# Patient Record
Sex: Female | Born: 1975
Health system: Southern US, Community
[De-identification: ages and names within clinical notes are randomized; demographics above are authoritative.]

## PROBLEM LIST (undated history)

## (undated) DIAGNOSIS — F332 Major depressive disorder, recurrent severe without psychotic features: Secondary | ICD-10-CM

## (undated) DIAGNOSIS — F319 Bipolar disorder, unspecified: Secondary | ICD-10-CM

## (undated) DIAGNOSIS — F419 Anxiety disorder, unspecified: Secondary | ICD-10-CM

## (undated) HISTORY — DX: Major depressive disorder, recurrent severe without psychotic features: F33.2

## (undated) HISTORY — PX: KNEE SURGERY: SHX244

---

## 1997-11-10 ENCOUNTER — Emergency Department (HOSPITAL_COMMUNITY): Admission: EM | Admit: 1997-11-10 | Discharge: 1997-11-10 | Payer: Self-pay | Admitting: Emergency Medicine

## 1998-06-01 ENCOUNTER — Emergency Department (HOSPITAL_COMMUNITY): Admission: EM | Admit: 1998-06-01 | Discharge: 1998-06-01 | Payer: Self-pay | Admitting: Emergency Medicine

## 1998-06-23 ENCOUNTER — Emergency Department (HOSPITAL_COMMUNITY): Admission: EM | Admit: 1998-06-23 | Discharge: 1998-06-23 | Payer: Self-pay | Admitting: Emergency Medicine

## 1998-06-23 ENCOUNTER — Encounter: Payer: Self-pay | Admitting: Emergency Medicine

## 1999-07-23 ENCOUNTER — Emergency Department (HOSPITAL_COMMUNITY): Admission: EM | Admit: 1999-07-23 | Discharge: 1999-07-23 | Payer: Self-pay | Admitting: Emergency Medicine

## 2000-06-09 ENCOUNTER — Other Ambulatory Visit: Admission: RE | Admit: 2000-06-09 | Discharge: 2000-06-09 | Payer: Self-pay | Admitting: Obstetrics and Gynecology

## 2000-10-05 ENCOUNTER — Encounter (INDEPENDENT_AMBULATORY_CARE_PROVIDER_SITE_OTHER): Payer: Self-pay | Admitting: Specialist

## 2000-10-05 ENCOUNTER — Other Ambulatory Visit: Admission: RE | Admit: 2000-10-05 | Discharge: 2000-10-05 | Payer: Self-pay | Admitting: Obstetrics and Gynecology

## 2001-04-15 ENCOUNTER — Encounter: Payer: Self-pay | Admitting: Emergency Medicine

## 2001-04-15 ENCOUNTER — Emergency Department (HOSPITAL_COMMUNITY): Admission: EM | Admit: 2001-04-15 | Discharge: 2001-04-15 | Payer: Self-pay | Admitting: Emergency Medicine

## 2001-11-08 ENCOUNTER — Other Ambulatory Visit: Admission: RE | Admit: 2001-11-08 | Discharge: 2001-11-08 | Payer: Self-pay | Admitting: Obstetrics and Gynecology

## 2002-07-02 ENCOUNTER — Other Ambulatory Visit: Admission: RE | Admit: 2002-07-02 | Discharge: 2002-07-02 | Payer: Self-pay | Admitting: Obstetrics and Gynecology

## 2006-05-25 ENCOUNTER — Observation Stay (HOSPITAL_COMMUNITY): Admission: EM | Admit: 2006-05-25 | Discharge: 2006-05-26 | Payer: Self-pay | Admitting: Emergency Medicine

## 2006-05-25 ENCOUNTER — Ambulatory Visit: Payer: Self-pay | Admitting: Cardiology

## 2006-06-07 ENCOUNTER — Ambulatory Visit: Payer: Self-pay

## 2006-06-07 ENCOUNTER — Encounter: Payer: Self-pay | Admitting: Cardiology

## 2006-06-13 ENCOUNTER — Ambulatory Visit: Payer: Self-pay | Admitting: Internal Medicine

## 2007-03-07 ENCOUNTER — Emergency Department (HOSPITAL_COMMUNITY): Admission: EM | Admit: 2007-03-07 | Discharge: 2007-03-07 | Payer: Self-pay | Admitting: Emergency Medicine

## 2007-05-07 ENCOUNTER — Emergency Department (HOSPITAL_COMMUNITY): Admission: EM | Admit: 2007-05-07 | Discharge: 2007-05-07 | Payer: Self-pay | Admitting: Emergency Medicine

## 2007-06-06 ENCOUNTER — Inpatient Hospital Stay (HOSPITAL_COMMUNITY): Admission: AD | Admit: 2007-06-06 | Discharge: 2007-06-10 | Payer: Self-pay | Admitting: Psychiatry

## 2007-06-06 ENCOUNTER — Ambulatory Visit: Payer: Self-pay | Admitting: Psychiatry

## 2008-03-27 ENCOUNTER — Emergency Department (HOSPITAL_COMMUNITY): Admission: EM | Admit: 2008-03-27 | Discharge: 2008-03-27 | Payer: Self-pay | Admitting: Emergency Medicine

## 2008-06-01 ENCOUNTER — Encounter: Admission: RE | Admit: 2008-06-01 | Discharge: 2008-06-01 | Payer: Self-pay | Admitting: Obstetrics and Gynecology

## 2008-09-15 ENCOUNTER — Emergency Department (HOSPITAL_COMMUNITY): Admission: EM | Admit: 2008-09-15 | Discharge: 2008-09-15 | Payer: Self-pay | Admitting: Emergency Medicine

## 2008-10-01 ENCOUNTER — Other Ambulatory Visit (HOSPITAL_COMMUNITY): Admission: RE | Admit: 2008-10-01 | Discharge: 2008-10-09 | Payer: Self-pay | Admitting: Psychiatry

## 2008-10-07 ENCOUNTER — Emergency Department (HOSPITAL_COMMUNITY): Admission: EM | Admit: 2008-10-07 | Discharge: 2008-10-08 | Payer: Self-pay | Admitting: Emergency Medicine

## 2008-10-08 ENCOUNTER — Ambulatory Visit: Payer: Self-pay | Admitting: Psychiatry

## 2008-10-08 ENCOUNTER — Inpatient Hospital Stay (HOSPITAL_COMMUNITY): Admission: RE | Admit: 2008-10-08 | Discharge: 2008-10-13 | Payer: Self-pay | Admitting: Psychiatry

## 2008-10-15 ENCOUNTER — Other Ambulatory Visit (HOSPITAL_COMMUNITY): Admission: RE | Admit: 2008-10-15 | Discharge: 2008-10-17 | Payer: Self-pay | Admitting: Psychiatry

## 2008-10-17 ENCOUNTER — Emergency Department (HOSPITAL_COMMUNITY): Admission: EM | Admit: 2008-10-17 | Discharge: 2008-10-18 | Payer: Self-pay | Admitting: Emergency Medicine

## 2008-10-18 ENCOUNTER — Inpatient Hospital Stay (HOSPITAL_COMMUNITY): Admission: RE | Admit: 2008-10-18 | Discharge: 2008-10-25 | Payer: Self-pay | Admitting: Psychiatry

## 2009-08-16 ENCOUNTER — Emergency Department (HOSPITAL_COMMUNITY): Admission: EM | Admit: 2009-08-16 | Discharge: 2009-08-16 | Payer: Self-pay | Admitting: Emergency Medicine

## 2009-08-18 ENCOUNTER — Encounter: Admission: RE | Admit: 2009-08-18 | Discharge: 2009-08-18 | Payer: Self-pay | Admitting: Orthopaedic Surgery

## 2009-10-13 ENCOUNTER — Emergency Department (HOSPITAL_COMMUNITY): Admission: EM | Admit: 2009-10-13 | Discharge: 2009-10-14 | Payer: Self-pay | Admitting: Emergency Medicine

## 2009-10-14 DIAGNOSIS — F322 Major depressive disorder, single episode, severe without psychotic features: Secondary | ICD-10-CM

## 2009-11-27 ENCOUNTER — Ambulatory Visit: Payer: Self-pay | Admitting: Family Medicine

## 2010-01-27 ENCOUNTER — Ambulatory Visit: Payer: Self-pay | Admitting: Family Medicine

## 2010-02-03 ENCOUNTER — Ambulatory Visit: Payer: Self-pay | Admitting: Psychiatry

## 2010-04-19 ENCOUNTER — Encounter: Payer: Self-pay | Admitting: Obstetrics and Gynecology

## 2010-06-13 LAB — COMPREHENSIVE METABOLIC PANEL
ALT: 8 U/L (ref 0–35)
AST: 19 U/L (ref 0–37)
Albumin: 4 g/dL (ref 3.5–5.2)
Alkaline Phosphatase: 99 U/L (ref 39–117)
Calcium: 9.5 mg/dL (ref 8.4–10.5)
GFR calc Af Amer: >60 mL/min (ref >60)
Potassium: 4 mEq/L (ref 3.5–5.1)
Sodium: 140 mEq/L (ref 135–145)
Total Protein: 8.2 g/dL (ref 6.0–8.3)

## 2010-06-13 LAB — ACETAMINOPHEN LEVEL: Acetaminophen (Tylenol), Serum: <10 ug/mL — ABNORMAL LOW (ref 10–30)

## 2010-06-13 LAB — DIFFERENTIAL
Eosinophils Absolute: 0.2 10*3/uL (ref 0.0–0.7)
Eosinophils Relative: 3 % (ref 0–5)
Lymphs Abs: 2.7 10*3/uL (ref 0.7–4.0)
Monocytes Absolute: 0.7 10*3/uL (ref 0.1–1.0)
Monocytes Relative: 10 % (ref 3–12)

## 2010-06-13 LAB — RAPID URINE DRUG SCREEN, HOSP PERFORMED
Amphetamines: NOT DETECTED
Barbiturates: NOT DETECTED
Benzodiazepines: POSITIVE — AB
Tetrahydrocannabinol: NOT DETECTED

## 2010-06-13 LAB — CBC
MCHC: 33.5 g/dL (ref 30.0–36.0)
RDW: 12.8 % (ref 11.5–15.5)
WBC: 6.4 10*3/uL (ref 4.0–10.5)

## 2010-06-13 LAB — ETHANOL: Alcohol, Ethyl (B): 8 mg/dL (ref 0–10)

## 2010-06-13 LAB — VALPROIC ACID LEVEL: Valproic Acid Lvl: 32.4 ug/mL — ABNORMAL LOW (ref 50.0–100.0)

## 2010-07-05 LAB — VALPROIC ACID LEVEL: Valproic Acid Lvl: 47.3 ug/mL — ABNORMAL LOW (ref 50.0–100.0)

## 2010-07-05 LAB — DIFFERENTIAL
Basophils Absolute: 0 10*3/uL (ref 0.0–0.1)
Basophils Relative: 0 % (ref 0–1)
Eosinophils Relative: 1 % (ref 0–5)
Monocytes Absolute: 0.7 10*3/uL (ref 0.1–1.0)
Neutro Abs: 5 10*3/uL (ref 1.7–7.7)

## 2010-07-05 LAB — BASIC METABOLIC PANEL
BUN: 4 mg/dL — ABNORMAL LOW (ref 6–23)
CO2: 26 mEq/L (ref 19–32)
Calcium: 9.2 mg/dL (ref 8.4–10.5)
Calcium: 9.3 mg/dL (ref 8.4–10.5)
GFR calc Af Amer: >60 mL/min (ref >60)
GFR calc non Af Amer: >60 mL/min (ref >60)
Glucose, Bld: 93 mg/dL (ref 70–99)
Sodium: 138 mEq/L (ref 135–145)
Sodium: 138 mEq/L (ref 135–145)

## 2010-07-05 LAB — CBC
HCT: 39.2 % (ref 36.0–46.0)
Hemoglobin: 12.8 g/dL (ref 12.0–15.0)
Hemoglobin: 13.2 g/dL (ref 12.0–15.0)
MCHC: 32.7 g/dL (ref 30.0–36.0)
RBC: 4.67 MIL/uL (ref 3.87–5.11)
RDW: 13.4 % (ref 11.5–15.5)

## 2010-07-05 LAB — URINALYSIS, ROUTINE W REFLEX MICROSCOPIC
Glucose, UA: NEGATIVE mg/dL
Hgb urine dipstick: NEGATIVE
Ketones, ur: 15 mg/dL — AB
Protein, ur: 30 mg/dL — AB

## 2010-07-05 LAB — PREGNANCY, URINE: Preg Test, Ur: NEGATIVE

## 2010-07-05 LAB — URINE MICROSCOPIC-ADD ON

## 2010-07-05 LAB — RAPID URINE DRUG SCREEN, HOSP PERFORMED
Barbiturates: NOT DETECTED
Benzodiazepines: POSITIVE — AB
Cocaine: NOT DETECTED

## 2010-07-05 LAB — ACETAMINOPHEN LEVEL: Acetaminophen (Tylenol), Serum: <0.1 ug/mL — ABNORMAL LOW (ref 10–30)

## 2010-08-11 NOTE — H&P (Signed)
Stacey Weaver, Stacey Weaver                ACCOUNT NO.:  [id number]   MEDICAL RECORD NO.:  [id number]          PATIENT TYPE:  IPS   LOCATION:  0406                          FACILITY:  [facility]   PHYSICIAN:  Margaret A. Scott, N.P.DATE OF BIRTH:  1975/09/05   DATE OF ADMISSION:  10/18/2008  DATE OF DISCHARGE:                       PSYCHIATRIC ADMISSION ASSESSMENT   DATE OF ASSESSMENT:  October 18, 2008 at 8:45 a.m.   IDENTIFICATION:  [name].  This is a voluntary admission.   HISTORY OF PRESENT ILLNESS:  [name] admission for this 35 year old  who was attending our intensive outpatient program and told the  counselor that she was suicidal.  She says that she is feeling  depressed, not able to sleep well, worried about issues related to her  job.  She has been promoted, has increased pay also a lot of increased  responsibility and stress.  She is vague about her intent.  Feels that  she cannot be safe at home yet does not have a specific plan.  But says  that she feels the same way for the past 4 or 5 days that she felt prior  to an overdose on Benadryl earlier this month.  No homicidal thoughts.   PAST PSYCHIATRIC HISTORY:  [facility] admission.  Previously here in  March 2009 and most recently here October 08, 2008 through October 13, 2008 on  the service of Dr. Electa Sniff and was transferred to our IOP program.  She  has been diagnosed with bipolar disorder by Dr. Tomasa Rand here in  [place], her primary psychiatrist, and is generally compliant with  her outpatient medications, which are noted below.  She has a history of  previous overdose on Benadryl.   SOCIAL HISTORY:  Single African-American female.  She has been living  with her boyfriend.  He is supportive, relationship is good.  She has no  children.  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  She denies any substance abuse.   FAMILY HISTORY:  Negative for mental illness or substance abuse.   MEDICAL HISTORY:   Medical problems are migraine headaches and asthma.   CURRENT MEDICATIONS:  Depakote 500 mg t.i.d., Lunesta 3 mg at bedtime,  Ambien 10 mg at bedtime p.r.n. insomnia, Cymbalta 60 mg daily, Klonopin  1 mg t.i.d. p.r.n. anxiety, and hydrocodone 5/325 as needed for some  chronic back pain.   DRUG ALLERGIES:  Axert and Zomig.   Physical exam was done in the emergency room where she was noted to be  generally healthy, in no acute distress.  This is a large-built,  overweight female.  Generally healthy in appearance.   CBC was normal.  Urine drug screen positive for benzodiazepines.  Urine  pregnancy test negative, and basic chemistry within normal limits.  Alcohol level was less than 5, BUN 4, creatinine 0.78.   MENTAL STATUS EXAM:  Fully alert female, cooperative, alert and oriented  x4, pleasant, nonpsychotic. Primary complaint is that she has not been  sleeping, feels that her mood has further deteriorated since she started  on Cymbalta.  She admits to  some suicidal thoughts but not necessarily  giving a clear plan or intent.  Memory intact.  Insight adequate.   ASSESSMENT:  AXIS I:  Major depressive disorder, recurrent.  AXIS II:  No diagnosis.  AXIS III:  Migraine headaches. History of asthma.  AXIS IV:  Moderate chronic work stressors.  AXIS V:  Current 48. Past year not known.   Plan is to voluntarily admit her to our grief and loss group.[name]  So far,  participation good in unit activities. We are continuing her usual  medications with the exception of the Cymbalta, which we will  discontinue.      Margaret A. Lorin Picket, N.P.     MAS/MEDQ  D:  10/18/2008  T:  10/18/2008  Job:  [id number]

## 2010-08-11 NOTE — H&P (Signed)
Stacey Weaver, Stacey Weaver                ACCOUNT NO.:  [id number]   MEDICAL RECORD NO.:  [id number]          PATIENT TYPE:  IPS   LOCATION:  0405                          FACILITY:  [facility]   PHYSICIAN:  Anselm Jungling, MD  DATE OF BIRTH:  Sep 25, 1975   DATE OF ADMISSION:  06/06/2007  DATE OF DISCHARGE:                       PSYCHIATRIC ADMISSION ASSESSMENT   This is a 35 year old single female voluntarily admitted on June 06, 2007.   HISTORY OF PRESENT ILLNESS:  The patient presents a history depression  and suicidal thoughts and anxiety.  She has been feeling very agitated  and has been feeling like she has been having some rough times for about  2 months.  She reports decreased sleep, her appetite has been  satisfactory.  She feels unsafe when having thoughts to overdose and  just does not feel like herself.  Her stressors are that she had a  recent breakup with a boyfriend has been noncompliant with the  medications, she states due to denial.   PAST PSYCHIATRIC HISTORY:  First admission to [facility].  Has been at [facility] twice before in 2005 and in 2007.  In the past  has been on Geodon and trazodone.   SOCIAL HISTORY:  She is a 64 old single [place] American female, has no  children.  Lives with her mom.  She works as a Engineer, site in a  Research officer, trade union.   FAMILY HISTORY:  Is unknown.   ALCOHOL DRUG HISTORY:  Nonsmoker.  Denies any alcohol or drug use.   MEDICAL HISTORY:  Primary care Tamora Huneke is Florencia Reasons who practices  in [place], medical problems are asthma.   MEDICATIONS:  Has been on Depakote 500 mg b.i.d., Lamictal 25 mg at  bedtime, birth control pill and Ambien for sleep and baclofen p.r.n.   DRUG ALLERGIES:  ZOMIG and Triptans.   PHYSICAL EXAM:  This is young female, very tearful.  There is no  physical distress noted. Her review of systems is significant for  insomnia, chest pain, shortness of breath, depression and mood changes.  Her temperature is 98.5, 88 heart rate, 20 respirations, blood pressure  is 110/65, 182 pounds, 5 feet 3 inches tall.  This is a healthy-  appearing female in no acute distress.  HEAD is atraumatic.  NECK:  Negative lymphadenopathy.  CHEST is clear, no wheezes.  HEART:  Regular rate and rhythm.  No murmurs, gallops auscultated.  ABDOMEN:  Soft, nontender abdomen.  Pelvic and GU exam was deferred.  EXTREMITIES 5+ against resistance.  No clubbing or edema.  SKIN is warm and dry.  No rashes, no lacerations.  NEURO:  Cranial nerves are intact.   LABORATORY DATA:  TSH is 2.383. Magnesium of 2, BUN of 1, otherwise CMET  is within normal limits.  Her CBC is within normal limits.  This is a  fully alert, labile female, irritable. She is appropriately dressed.  Speech is clear.  Her affect is again irritable initially and tearful at  the end of the interview.  Thought processes are no evidence of any  delusional thinking, expressing some  suicidal thoughts, cognitive  function intact.  Memory is good.  Judgment and insight is fair.   AXIS I:  Bipolar disorder.  AXIS II:  Deferred.  AXIS III:  Asthma.  AXIS IV:  Psychosocial problems related to burden of illness, breakup  with a boyfriend.  AXIS V:  Current is 35.   PLAN:  Contract for safety.  Stabilize mood and thinking.  Will add  Risperdal for her anxiety, stress level and mood lability.  Case manager  will assess her follow-up.  The patient may need a therapist and will  identify her support system.  Tentative length of stay is 4-5 days.      Landry Corporal, N.P.      Anselm Jungling, MD  Electronically Signed    JO/MEDQ  D:  06/07/2007  T:  06/08/2007  Job:  [phone]

## 2010-08-11 NOTE — H&P (Signed)
Stacey Weaver, Stacey Weaver                ACCOUNT NO.:  [id number]   MEDICAL RECORD NO.:  [id number]          PATIENT TYPE:  IPS   LOCATION:  0402                          FACILITY:  [facility]   PHYSICIAN:  Anselm Jungling, MD  DATE OF BIRTH:  Aug 24, 1975   DATE OF ADMISSION:  10/08/2008  DATE OF DISCHARGE:                       PSYCHIATRIC ADMISSION ASSESSMENT   HISTORY:  [name] who was voluntarily on October 08, 2008.  The patient reporting an intentional overdose of Benadryl taking  approximately 30-40 tablets.  Stating that she was taking them to help  her sleep, feeling depressed and agitated about everything.  States she  has been so depressed for the past 3 weeks.  She reports no specific  triggers.  She has been in the IOP program that she states has not been  very helpful for her.  She denies any substance use.  Reports compliance  with most of her medicines.  She reports her depression is so bad that  she is unable to work and is needing FMLA papers to be taken care of.   PAST PSYCHIATRIC HISTORY:  The patient has been here before, second  admission to [facility].  She was here in March 2009.  Admitted  at that time for depression, suicidal thoughts and racing thoughts.  She  reports being on most psychotropic medications.  She is currently seeing  Dr. Tiajuana Amass.   SOCIAL HISTORY:  This is a 35 year old female who lives in [place],  lives with her fiancee who she states is supportive.  Has no children.  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   FAMILY HISTORY:  Unknown.   ALCOHOL/DRUG HISTORY:  She denies any alcohol or substance use.   PRIMARY CARE Takela Varden:  Florencia Reasons, MD.   ORTHOPEDIC PHYSICIAN:  Vanita Panda. Magnus Ivan, MD.   MEDICAL PROBLEMS:  History of asthma.   MEDICATIONS:  1. Depakote ER 1500 mg at bedtime.  2. Lunesta 3 mg at bedtime.  3. Ambien 10 mg at bedtime.  4. Lamictal 100 mg q.h.s.  Some noncompliance with the  medication as      reports it having a bad taste.  5. Cymbalta 60 daily.  6. Klonopin 1 mg b.i.d. p.r.n.  7. Hydrocodone 5/225 b.i.d. p.r.n. for back pain.   DRUG ALLERGIES:  TRIPTAN which she reports a side effect of acting  weird.   PHYSICAL EXAMINATION:  This is a young female.  She was assessed at  [facility] Emergency Department.  Physical exam was reviewed and no  significant findings.  She is tearful today, but  appears in no physical  distress.  She offers no complaints.   LABORATORY DATA:  Her urine shows ketones of 15.  Urine drug screen is  positive for opiates and positive for benzodiazepines.  Valproic acid  level was 47.3.  Salicylate level less than 4.  BMET is within normal  limits.  Alcohol level less than 5.  Tylenol level less than 0.1.  CBC  is within normal limits.   MENTAL STATUS EXAM:  This is a tearful, alert,  cooperative female, fair  eye contact.  She is currently wearing scrubs.  Her speech is clear,  normal pace and tone.  Mood is depressed.  Feeling hopeless.  The  patient again is tearful throughout most of the interview.  Thought  processes are coherent.  Does focus on the fact that she needs papers to  be signed.  Wants some sort of intervention to make her feel better.  Cognitive function intact.  Her memory appears intact.  Judgment and  insight are fair.   AXIS I:  Depressive disorder not otherwise specified.  AXIS II:  Deferred.  AXIS III:  Asthma.  AXIS IV:  Psychosocial problems, possible problems with occupation.  AXIS V:  Current is 35.   PLAN:  Our plan is to stabilize her mood and thinking.  We will review  her medications for a possible adjustment.  The patient may benefit from  some individual therapy.  We will consider having her fiancee come in  for a session for support.  Her tentative length of stay at this time is  3-5 days.      Landry Corporal, N.P.      Anselm Jungling, MD  Electronically Signed    JO/MEDQ  D:   10/09/2008  T:  10/09/2008  Job:  [id number]

## 2010-08-11 NOTE — Discharge Summary (Signed)
Stacey Weaver, Stacey Weaver                ACCOUNT NO.:  [id number]   MEDICAL RECORD NO.:  [id number]          PATIENT TYPE:  IPS   LOCATION:  0402                          FACILITY:  [facility]   PHYSICIAN:  Anselm Jungling, MD  DATE OF BIRTH:  Aug 20, 1975   DATE OF ADMISSION:  10/08/2008  DATE OF DISCHARGE:  10/13/2008                               DISCHARGE SUMMARY   IDENTIFYING DATA AND REASON FOR ADMISSION:  This was an inpatient  psychiatric admission for Stacey Weaver, a 35 year old single African American  female who was admitted because of increasing signs and symptoms of  psychosis, with suicide risk.  Please refer to the admission note for  further details pertaining to her symptoms, circumstances and history  that led to her hospitalization.  She was given an initial Axis I  diagnosis of depressive disorder NOS.   MEDICAL AND LABORATORY:  The patient was medically and physically  assessed by the psychiatric nurse practitioner.  She was in good health  without any active or chronic medical problems except for asthma.  There  were no significant medical issues.  She had been prescribed hydrocodone  for back pain, but this was not continued during her inpatient stay, as  it was felt to be inappropriate and unnecessary.   HOSPITAL COURSE:  The patient was admitted to the adult inpatient  psychiatric service.  She presented as a well-nourished, normally-  developed adult female who was alert, fully oriented, but appeared  profoundly depressed, sad, tearful, and crying softly throughout the  initial interview.  There were no signs or symptoms of psychosis or  thought disorder.  When asked about suicidal thoughts, she was unclear  in her response.  She did verbalize a strong desire for help.   She was continued on a regimen of psychotropic medications that had been  prescribed to her previously, including Depakote, Cymbalta, Lamictal and  Ambien.  These had been prescribed by Dr. Tomasa Rand, a  Psychiatrist.   The patient participated in various therapeutic groups and activities,  geared towards helping her acquire better understanding of her  underlying stressors, underlying dynamics, and the development of an  aftercare plan.  She needed a lot of encouragement to participate, and  spent a good deal of time isolating in her room.  However, towards the  end of her hospital stay she was participating more regularly.   She had attended the [facility] intensive outpatient program prior to her  admission, and she was interested in returning there for step-down care  following her discharge.   The patient was discharged on Sunday, July 18, with a plan to resume the  intensive outpatient program on the following Monday, July 19, 201 at  9:00.   DISCHARGE MEDICATIONS:  1. Depakote ER 1500 mg p.o. nightly.  2. Cymbalta 60 mg daily.  3. Lamictal 100 mg daily.  4. Ambien 10 mg as needed for insomnia.  5. Klonopin 1 mg as needed twice daily.   DISCHARGE DIAGNOSES:  AXIS I:  Bipolar disorder, type 2, most recently  depressed without psychotic features.  AXIS II:  Deferred.  AXIS III:  No acute or chronic illnesses.  AXIS IV:  Stressors severe.  AXIS V:  Global Assessment of Functioning on discharge 55.      Anselm Jungling, MD  Electronically Signed     SPB/MEDQ  D:  10/14/2008  T:  10/14/2008  Job:  [id number]

## 2010-08-14 NOTE — H&P (Signed)
NAMEISIDORA, LAHAM                ACCOUNT NO.:  [id number]   MEDICAL RECORD NO.:  [id number]          PATIENT TYPE:  EMS   LOCATION:  MAJO                         FACILITY:  [facility]   PHYSICIAN:  Reginia Forts, MD     DATE OF BIRTH:  January 27, 1976   DATE OF ADMISSION:  05/25/2006  DATE OF DISCHARGE:                              HISTORY & PHYSICAL   CHIEF COMPLAINT:  Chest pain.   Ms. Murry is a 35 year old African American woman with a positive  family history for premature coronary artery disease who presents with  14 hours of intermittent sharp left chest pain.  The patient denies any  history of a recent viral illness, chest trauma or chest pain.  This  morning, at approximately 7:30 a.m., while eating, the patient developed  sharp left chest pain under her left breast lasting 10-15 seconds.  Over  the course of the day, patient would have a recurrent episode every 30-  60 minutes.  She also noted associated shortness of breath with each  episode.  There was no significant correlation with exertion or eating.  Patient denied any cough or fever or worsening with arm or shoulder  movement.  She underwent an outpatient TEE protocol CT, which was  reportedly negative and subsequently reported back to work.  However,  symptoms continued to persist throughout the afternoon with occasional  radiation to the scapula in the left shoulder.  Episodes never  progressed beyond 10-15 seconds.  She took a low dose of Motrin with no  significant relief and subsequently presented to the emergency room for  further evaluation.  She currently rates her pain as a 1/10, describing  as a small, nagging pain at the left breast area.   PAST MEDICAL HISTORY:  Bipolar disorder.   ALLERGIES:  ARE TO TRIPTAN CAUSING SIGNIFICANT FLUSHING FEELING.   MEDICATIONS:  Are:  1. Depakote 2000 mg p.o. daily.  2. Oral contraceptive, Ortho Tri-Cyclen Lo.   SOCIAL HISTORY:  Patient lives in [place].  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  She denies any tobacco, alcohol or drug use.   Her father expired from an MI at age 89.  Maternal grandmother had  multiple MIs.   REVIEW OF SYSTEMS:  Notable for occasional headache and systems as per  HPI.  The rest of 12-point review of systems was reviewed and is  negative.   PHYSICAL EXAMINATION:  VITAL SIGNS:  Temperature is 97.4.  Pulse is 77.  Respiratory rate is 18.  Blood pressure 125/57.  GENERAL:  Patient is awake, alert and oriented x3 in no acute distress.  HEENT:  Normocephalic, atraumatic.  Pupils equal, round and reactive to  light.  Extraocular movements are intact.  NECK:  Shows no JVD.  No carotid bruits.  CARDIOVASCULAR:  Regular rhythm.  Normal rate.  No murmurs, rubs or  gallops.  LUNGS:  Clear to auscultation bilaterally.  CHEST:  Chest wall demonstrates no significant tenderness to deep  palpation.  No tenderness to scapular region.  ABDOMEN:  Positive bowel sounds.  Soft, nontender, nondistended.  EXTREMITIES:  Show  no cyanosis, clubbing or edema, 2+ distal pulses.  PSYCH:  Demonstrates normal affect.  MUSCULOSKELETAL:  Demonstrates no joint deformities.  No effusions.  NEURO:  Cranial nerves II-XII grossly intact.  No focal, musculoskeletal  or sensory deficits.   Chest x-ray is pending.  EKG demonstrates normal sinus rhythm with  nonspecific ST/T-wave changes inferiorly.   LABS:  Creatinine is 0.9, sodium was 3.5.  Troponin is less than 0.05,  CK is 46.7, MB is less than 1.0.   ASSESSMENT/PLAN:  This is a 35 year old [place] American woman with  atypical chest pain, more suggestive of a noncardiac etiology such as  gastrointestinal, pleuritic or musculoskeletal pain.   1. Acute coronary syndrome.  We will rule the patient out for      myocardial infarction.  She will provided subcu heparin and be      placed on a beta blocker during the rule out.  We will consider a      stress test once the patient has been completely ruled out.   2. Atypical chest pain.  We will start a proton pump inhibitor for any      possibility of this being a gastrointestinal source.  Toradol 30 mg      IV x1 will be given for pain relief.  3. Bipolar disorder.  Patient will be continued on her Depakote.      Reginia Forts, MD  Electronically Signed     RA/MEDQ  D:  05/25/2006  T:  05/26/2006  Job:  [id number]

## 2010-08-14 NOTE — Assessment & Plan Note (Signed)
[facility]                            CARDIOLOGY OFFICE NOTE   EUNICE, WINECOFF                       MRN:          [id number]  DATE:06/13/2006                            DOB:          [birth year]    INTERVAL HISTORY:  Ms. Bendavid is a 35 year old woman with a history of  bipolar disorder.  She was recently admitted for atypical chest pain.  She ruled out for a myocardial infarction with zero cardiac markers and  was discharged home with an outpatient stress test and echo.  The stress  chest has showed an EF of 75% with no significant perfusion defects.  There was poor exercise tolerance.  Echocardiogram was completely normal  with an EF of 65%.  She returns today saying she continues to have  episodes of brief, sharp chest pain.  She says it starts in her chest  and goes around to her back.  She also feels mildly short of breath and  has some palpitations with it.  Her friend has suggested to her that it  may be anxiety.  She denies this.  She has not had any prolonged  episodes.  She has not had nocturnal symptoms.  She denies syncope or  presyncope.   CURRENT MEDICATIONS:  1. Ortho Tricyclen.  2. Albuterol p.r.n.  3. Depakote.   PHYSICAL EXAMINATION:  GENERAL:  She is well-appearing, no acute  distress.  Ambulates around the office without any respiratory  difficulty.  VITAL SIGNS:  Blood pressure is 104/70, heart rate is 76.  HEENT:  Sclerae anicteric.  EOMI.  There is no xanthelasma.  Mucous  membranes are moist.  Oropharynx is clear.  NECK:  Is supple.  No JVD.  Carotids are 2+ bilaterally without any  bruits. There is no lymphadenopathy, cardiomegaly.  CARDIAC:  Regular rate and rhythm.  No murmurs, rubs or gallops.  LUNGS:  Are clear.  ABDOMEN:  Soft, nontender, nondistended.  No hepatosplenomegaly.  No  bruits, no masses, good bowel sounds.  EXTREMITIES:  Are warm with no cyanosis, clubbing or edema.  No cords or  rashes.  NEURO:   She is alert and oriented x3.  Cranial nerves II through XII are  intact.  She moves all 4 extremities without difficulty.  Affect is  pressured.   EKG shows sinus rhythm at a rate of 76 with no ST-T wave abnormalities.   ASSESSMENT AND PLAN:  Chest pain, this is quite typical and given her  age and lack of risk factors, I doubt this is cardiac.  She also had a  normal stress test and normal echocardiogram.  I explained this to her  and I told her that I agree this is likely anxiety.  She was somewhat in  disbelief about this.  I tried to reinforce this to her.  We did discuss  possibility of catheterization, but I said that I do not think the risk  benefit ratio would be in her favor.  I suggested to her that she find a  primary care doctor and consider going on an anxiolytic.  She has also  already been tried on Protonix without much relief, so I think it is  unlikely that reflux disease is causing this.   DISPOSITION:  She can return on a p.r.n. basis.     Bevelyn Buckles. Bensimhon, MD  Electronically Signed    DRB/MedQ  DD: 06/13/2006  DT: 06/14/2006  Job #: [id number]

## 2010-08-14 NOTE — Discharge Summary (Signed)
NAMEEMERITA, BERKEMEIER                ACCOUNT NO.:  [id number]   MEDICAL RECORD NO.:  [id number]          PATIENT TYPE:  IPS   LOCATION:  0406                          [facility]ACILITY[facility]  [facility]   PHYSICIAN:  Anselm Jungling, MD  DATE OF BIRTH:  05/30/1975   DATE OF ADMISSION:  10/18/2008  DATE OF DISCHARGE:  10/25/2008                               DISCHARGE SUMMARY   IDENTIFYING DATA AND Jaquita Rector FOR ADMISSION:  This was an inpatient  psychiatric admission for Montgomery, a 35 year old unmarried African  American female who was admitted because of increasing depression and  suicidal ideation.  Please refer to the admission note for further  details regarding symptoms, circumstances and history of  hospitalization.   MEDICAL AND LABORATORY:  The patient was medically and physically  assessed by the psychiatric nurse practitioner.  She was in generally  good health but was prone to migraine headaches and asthma.  She also  had chronic back pain.  There were no significant medical issues during  this stay.   HOSPITAL COURSE:  The patient  was admitted to the adult inpatient  psychiatric service.  She presented as a well-nourished, normally-  developed [place] American female who displayed depressed mood and sad  affect.  She was alert and fully oriented, pleasant and nonpsychotic.  In the initial interview, her primary complaint was that of not  sleeping.  She reported that she had been recently started on a trial of  Cymbalta and felt that her mood had deteriorated further since  initiation.  She admitted to suicidal ideation, but was vague as to plan  or intent.  She was given an initial Axis I diagnosis of major  depressive disorder, current, and Axis II:  Features, NOS.   She participated in various therapeutic groups and activities, although  minimally, and in spite of repeated exhortations  for her to participate  in the groups and activities, the patient avoided them, made excuses for  not  attending them, and probably attended less than one-third of the  available program activities and groups that occurred during her stay.   The patient had been with [place] approximately 1 month prior, and had shown  a similar pattern of nonparticipation.  Upon this readmission it was  made clear to her that the expectation  would be that she would  participate 1005.  Nonetheless, she tended to avoid participation  and  when confronted about it afterwards tended to deny that she had done so.   With respect to medications, she was not continued on Cymbalta, but was  continued on her previous regimen of Lamictal, Depakote, and Ambien.  She had previously had benzodiazepines on board for anxiety, but it was  felt that this was not appropriate for long term usage due to risk of  habituation and dependence, and as such, Seroquel 25 mg t.i.d. was  substituted with reasonably good results.   The patient had come to [place] from our intensive outpatient psychiatric  program.  Although she stated that she wanted to return to it, it was  felt the patient was not  a good match for that  program, since that  program is, after all, virtually entirely consisting of therapeutic  groups and classes, which the patient showed no interest in while she  was in the inpatient program.   She was appropriate for discharge on the ninth hospital day, having  seemed to exhaust any potential benefit  for treatment.  She was  referred to the following aftercare plan.   AFTERCARE:  The patient is to follow-up with Dr. Tomasa Rand,  psychiatrist, with an appointment on July 30 at 9:15 a.m.[name]  She was also  referred to an individual therapist at [facility] with an  appointment there on the August 2 at 3:45 p.m.   DISCHARGE MEDICATIONS:  Ambien 10 mg q.h.s., Lamictal 200 mg q.h.s.,  Depakote 500 mg b.i.d. Seroquel 25 mg b.i.d..   DISCHARGE DIAGNOSES:  Axis I:  Bipolar II NOS.  Axis II:  Disorder NOS, passive and  dependent personality traits.  Axis III: Chronic back pain, history of migraines, asthma.  Axis IV: Stressors severe.  Axis V: GAF on discharge 55.      Anselm Jungling, MD  Electronically Signed     SPB/MEDQ  D:  10/30/2008  T:  10/30/2008  Job:  [id number]

## 2010-08-14 NOTE — Assessment & Plan Note (Signed)
[facility]                                 ON-CALL NOTE   Stacey Weaver, Stacey Weaver                         MRN:          [id number]  DATE:05/25/2006                            DOB:          1976-03-26    PHONE NUMBER:  [phone]   HISTORY:  I was contacted by Huntley Dec at [facility] in [place] about  patient named Stacey Weaver. She is 35 years old without any previous  history of coronary disease. She does have a family history of coronary  disease. The patient works at [employer] and had been having some  atypical left-sided chest pain. She had a CT scan that was negative for  pulmonary embolus. The patient continued to have chest pains and she was  set up for an appointment with Dr. Eden Emms on February 28th. However, the  patient continued to complain of chest pain and was somewhat worried and  requested to go to the emergency room. Huntley Dec from [facility] called me to  let me know that she was coming to [facility].   PLAN:  The patient will be seen by our staff in the ER at [facility].   DISPOSITION:  As noted above.      Tereso Newcomer, PA-C  Electronically Signed      Madolyn Frieze. Jens Som, MD, [facility]  Electronically Signed   SW/MedQ  DD: 05/25/2006  DT: 05/25/2006  Job #: [id number]

## 2010-08-14 NOTE — Discharge Summary (Signed)
Stacey Weaver, Stacey Weaver                ACCOUNT NO.:  [id number]   MEDICAL RECORD NO.:  [id number]          PATIENT TYPE:  INP   LOCATION:  4742                         FACILITY:  [facility]   PHYSICIAN:  Bevelyn Buckles. Bensimhon, MDDATE OF BIRTH:  08/12/1975   DATE OF ADMISSION:  05/25/2006  DATE OF DISCHARGE:  05/26/2006                               DISCHARGE SUMMARY   PRIMARY DISCHARGE DIAGNOSIS:  Chest pain.  Enzymes negative for  myocardial infarction and outpatient Myoview arranged.   SECONDARY DIAGNOSES:  1. Bipolar disorder.  2. Allergy or intolerance to triptans.  3. Family history of coronary artery disease in her father who died of      a myocardial infarction at age 37.  4. Occasional headaches.  5. Dyslipidemia with a total cholesterol 193, triglycerides 116, HDL      50, LDL 120, low fat diet recommended.   TIME OF DISCHARGE:  Thirty-five minutes.   HOSPITAL COURSE:  Stacey Weaver is a 35 year old female with no previous  history of coronary artery disease.  She has been having episodes of  chest pain and had a CT scan as an outpatient that was negative for PE  by reports, although further details are not available.  She has  continued to have chest pains and was set up with Dr. Eden Emms but has not  seen him yet.  Because of ongoing pain, she came to the emergency room  and was admitted for further evaluation and treatment.   Her cardiac enzymes were negative for MI.  Her symptoms improved with  pain control medication.  Her laboratory values were within normal  limits including a hemoglobin of 12.5, hematocrit 37.1, WBC 7.2,  platelets 279.  B-MET values were within normal limits.  Albumin  slightly low at 3.4, and a lipid profile as described above.   On May 26, 2006, Stacey Weaver was evaluated by Dr. Gala Romney.  Her  cardiac enzymes were negative for MI.  Her symptoms had improved with  pain control medications, including Motrin and Tylenol.  She was also  given morphine,  but this will not be used as an outpatient.  She was  evaluated by Dr. Gala Romney and considered stable for discharge with  outpatient followup arranged.   DISCHARGE INSTRUCTIONS:  1. Her activity level is to be increased gradually.  2. She is requested to stick to a low-fat diet.  3. She is to follow up with Dr. Gala Romney on June 13, 2006 at 3:15      p.m.  4. She is to follow up with Dani Gobble, PAC at [facility] as      needed.  5. She is to obtain an echocardiogram on June 07, 2006 at 2 p.m. and      a stress test will be performed the same day at 12:30.   DISCHARGE MEDICATIONS:  1. Motrin 800 mg p.o. t.i.d. for 1 week and then t.i.d. p.r.n.  2. Tylenol 500 mg up to q.i.d. p.r.n.  3. Depakote 2,000 mg daily.  4. Ortho Tri-Cyclen every day as prior to admission.  5. Prilosec OTC 20 mg  daily p.r.n.      Theodore Demark, PA-C      Bevelyn Buckles. Bensimhon, MD  Electronically Signed    RB/MEDQ  D:  05/26/2006  T:  05/26/2006  Job:  [id number]   cc:   Dani Gobble, [facility]

## 2010-08-14 NOTE — Discharge Summary (Signed)
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  Please refer to the admission note for  further details pertaining to the symptoms, circumstances and history  that led to her hospitalization.  She was given an initial Axis I  diagnosis of bipolar disorder, currently hypomanic, rule out mixed.   MEDICAL AND LABORATORY:  The patient was in good health without any  active or chronic medical problems.  She was medically and physically  assessed by the psychiatric nurse practitioner.   HOSPITAL COURSE:  The patient was admitted to the adult inpatient  psychiatric service.  She presented as a well-nourished, well-developed  woman who was alert, fully oriented, well-organized with good insight.  Thoughts and speech were normally organized and no delusions were  expressed.  There were no signs or symptoms of psychosis or thought  disorder, and there was no suicidal ideation.  She denied auditory and  visual hallucinations.  She verbalized a strong desire for help.   The patient was treated with a psychotropic regimen that included  Depakote, and in addition, Risperdal, Ambien, and a trial of Lamictal.  She was involved in various therapeutic groups and activities and was a  reasonably good participant in the treatment program.  Family  involvement took the form of her mother coming for a family session.   The patient had a good response to medications,  with fewer racing  thoughts.  She was a reasonably good participant in the treatment  program.   On the fifth and final day, there was a family session involving the  patient and her mother, in which medication noncompliance was discussed.  However, mother was very supportive and all-in-all it was a positive  meeting and the patient was judged to be ready for discharge following  this.   AFTERCARE PLAN:  The patient was to follow up for outpatient psychiatric  care with Dr. [facility], and Maisie Fus.  Her  follow-up appointments were on June 13, 2007.   DISCHARGE MEDICATIONS:  1. Risperdal 0.5 mg t.i.d.  2. Ambien 10 mg every evening p.r.n.  3. Lamictal 125 mg tablet daily.  4. Depakote ER 500 mg b.i.d.   DIAGNOSIS:  AXIS II: Deferred.  AXIS III: No acute or chronic illnesses.  AXIS IV: Stressors severe.  AXIS V: Global assessment of functioning 55.      Anselm Jungling, MD  Electronically Signed     SPB/MEDQ  D:  07/07/2007  T:  07/07/2007  Job:  [id number]

## 2010-11-21 ENCOUNTER — Inpatient Hospital Stay (HOSPITAL_COMMUNITY)
Admission: EM | Admit: 2010-11-21 | Discharge: 2010-11-24 | DRG: 918 | Disposition: A | Discharge status: Home / Self Care | Payer: Self-pay | Attending: Internal Medicine | Admitting: Internal Medicine

## 2010-11-21 DIAGNOSIS — I498 Other specified cardiac arrhythmias: Secondary | ICD-10-CM | POA: Diagnosis present

## 2010-11-21 DIAGNOSIS — E876 Hypokalemia: Secondary | ICD-10-CM | POA: Diagnosis not present

## 2010-11-21 DIAGNOSIS — T43501A Poisoning by unspecified antipsychotics and neuroleptics, accidental (unintentional), initial encounter: Secondary | ICD-10-CM | POA: Diagnosis present

## 2010-11-21 DIAGNOSIS — R42 Dizziness and giddiness: Secondary | ICD-10-CM | POA: Diagnosis present

## 2010-11-21 DIAGNOSIS — T43294A Poisoning by other antidepressants, undetermined, initial encounter: Principal | ICD-10-CM | POA: Diagnosis present

## 2010-11-21 DIAGNOSIS — J45909 Unspecified asthma, uncomplicated: Secondary | ICD-10-CM | POA: Diagnosis present

## 2010-11-21 DIAGNOSIS — F319 Bipolar disorder, unspecified: Secondary | ICD-10-CM | POA: Diagnosis present

## 2010-11-21 DIAGNOSIS — T43591A Poisoning by other antipsychotics and neuroleptics, accidental (unintentional), initial encounter: Secondary | ICD-10-CM | POA: Diagnosis present

## 2010-11-21 LAB — URINE MICROSCOPIC-ADD ON

## 2010-11-21 LAB — URINALYSIS, ROUTINE W REFLEX MICROSCOPIC
Nitrite: NEGATIVE
Specific Gravity, Urine: 1.02 (ref 1.005–1.030)
Urobilinogen, UA: 0.2 mg/dL (ref 0.0–1.0)
pH: 6 (ref 5.0–8.0)

## 2010-11-21 LAB — DIFFERENTIAL
Basophils Absolute: 0 10*3/uL (ref 0.0–0.1)
Eosinophils Relative: 1 % (ref 0–5)
Lymphocytes Relative: 36 % (ref 12–46)
Lymphs Abs: 2.2 10*3/uL (ref 0.7–4.0)
Monocytes Absolute: 0.5 10*3/uL (ref 0.1–1.0)
Neutro Abs: 3.5 10*3/uL (ref 1.7–7.7)

## 2010-11-21 LAB — SALICYLATE LEVEL: Salicylate Lvl: <2 mg/dL — ABNORMAL LOW (ref 2.8–20.0)

## 2010-11-21 LAB — RAPID URINE DRUG SCREEN, HOSP PERFORMED
Barbiturates: NOT DETECTED
Benzodiazepines: POSITIVE — AB
Cocaine: NOT DETECTED
Tetrahydrocannabinol: NOT DETECTED

## 2010-11-21 LAB — CBC
HCT: 38.9 % (ref 36.0–46.0)
Hemoglobin: 12.6 g/dL (ref 12.0–15.0)
MCHC: 32.4 g/dL (ref 30.0–36.0)
MCV: 86.6 fL (ref 78.0–100.0)
RDW: 13 % (ref 11.5–15.5)

## 2010-11-21 LAB — COMPREHENSIVE METABOLIC PANEL
AST: 13 U/L (ref 0–37)
Albumin: 3.8 g/dL (ref 3.5–5.2)
BUN: 4 mg/dL — ABNORMAL LOW (ref 6–23)
Chloride: 102 mEq/L (ref 96–112)
Creatinine, Ser: 0.84 mg/dL (ref 0.50–1.10)
Potassium: 2.8 mEq/L — ABNORMAL LOW (ref 3.5–5.1)
Total Protein: 8.3 g/dL (ref 6.0–8.3)

## 2010-11-21 LAB — ACETAMINOPHEN LEVEL: Acetaminophen (Tylenol), Serum: <15 ug/mL (ref 10–30)

## 2010-11-21 LAB — ETHANOL: Alcohol, Ethyl (B): <11 mg/dL (ref 0–11)

## 2010-11-22 DIAGNOSIS — F319 Bipolar disorder, unspecified: Secondary | ICD-10-CM

## 2010-11-22 LAB — BASIC METABOLIC PANEL
BUN: 3 mg/dL — ABNORMAL LOW (ref 6–23)
Calcium: 9 mg/dL (ref 8.4–10.5)
Chloride: 108 mEq/L (ref 96–112)
Creatinine, Ser: 0.79 mg/dL (ref 0.50–1.10)
GFR calc Af Amer: >60 mL/min (ref >60)
GFR calc non Af Amer: >60 mL/min (ref >60)

## 2010-11-22 LAB — CBC
HCT: 35.2 % — ABNORMAL LOW (ref 36.0–46.0)
MCH: 28 pg (ref 26.0–34.0)
MCHC: 32.1 g/dL (ref 30.0–36.0)
MCV: 87.3 fL (ref 78.0–100.0)
RDW: 13.3 % (ref 11.5–15.5)

## 2010-11-23 DIAGNOSIS — F319 Bipolar disorder, unspecified: Secondary | ICD-10-CM

## 2010-11-23 LAB — BASIC METABOLIC PANEL
BUN: <3 mg/dL — ABNORMAL LOW (ref 6–23)
Calcium: 9 mg/dL (ref 8.4–10.5)
Chloride: 108 mEq/L (ref 96–112)
Creatinine, Ser: 0.8 mg/dL (ref 0.50–1.10)
GFR calc Af Amer: >60 mL/min (ref >60)

## 2010-11-24 DIAGNOSIS — F319 Bipolar disorder, unspecified: Secondary | ICD-10-CM

## 2010-11-28 NOTE — H&P (Signed)
NAMELYSANDRA, Weaver                ACCOUNT NO.:  [id number]  MEDICAL RECORD NO.:  [id number]  LOCATION:  1521                         FAC[facility]  [facility]  PHYSICIAN:  Della Goo, M.D. DATE OF BIRTH:  1975/09/13  DATE OF ADMISSION:  11/21/2010 DATE OF DISCHARGE:                             HISTORY & PHYSICAL   DATE OF ADMISSION:  November 21, 2010.  PRIMARY CARE PHYSICIAN:  Unassigned  CHIEF COMPLAINT:  Overdose.  HISTORY OF PRESENT ILLNESS:  This is a 35 year old female with a history of severe depression and bipolar disorder, who was brought to the emergency department emergently after taking 12 Wellbutrin tablets and 1 Seroquel tablet about 1 hour prior to arrival.  The patient denies suicidal ideation or suicidal thoughts.  She said she took the medications because she wanted to feel better.  She has been actively being treated for her depression and psychiatric problems for the past 8 months since a hospitalization.  The patient arrived at the emergency department about 1 hour after the ingestion and was evaluated and Poison Control was contacted, but the patient also contacted Poison Control herself.  When the patient arrived, she was described as being lethargic, but remained arousable. The patient was referred for medical admission for continued observation for cardiovascular changes such as tachycardia and arrhythmias.  The patient was found to be tachycardic in the emergency department with a heart rate of 112-128.  PAST MEDICAL HISTORY:  Significant for: 1. Asthma. 2. Migraine headaches. 3. Bipolar disorder. 4. Depression. 5. Anxiety.  PAST SURGICAL HISTORY:  History of a right knee arthroscopic surgery.  MEDICATIONS:  Wellbutrin, Abilify, Seroquel, promethazine, Lamictal. She also takes over-the-counter Advil and she also takes over-the- counter ginkgo.  ALLERGIES: 1. AXERT. 2. ZOMIG. 3. CYMBALTA.  SOCIAL HISTORY:  The patient is disabled.  She lives  with her mother. She is a nonsmoker, nondrinker.  No history of illicit drug usage.  FAMILY HISTORY:  Positive for hypertension in her maternal family, her mother, her maternal grandmother, her maternal aunt.  No cancer in her family.  No coronary artery disease.  No diabetic disease in her family.  REVIEW OF SYSTEMS:  Pertinents mentioned above.  All other organ systems are negative.  PHYSICAL EXAMINATION FINDINGS:  GENERAL:  This is a 35 year old morbidly obese African American female who is somnolent, but arousable and in no acute distress currently. VITAL SIGNS:  Temperature 98.4, blood pressure 123/73, heart rate 112- 128, respirations 20-22, O2 sats 98-100%. HEENT EXAMINATION:  Normocephalic, atraumatic.  Pupils equally round, reactive to light.  Extraocular movements are intact.  Funduscopic benign.  There is no scleral icterus.  Nares are patent bilaterally. Oropharynx is clear. NECK:  Supple, full range of motion.  No thyromegaly, adenopathy, jugular venous distention. CARDIOVASCULAR:  Tachycardic rate and rhythm.  No murmurs, gallops, or rubs appreciated. LUNGS:  Clear to auscultation bilaterally.  Breathing is unlabored. CHEST WALL:  Excursion is symmetric and chest wall is nontender. ABDOMEN:  Obese, positive bowel sounds, soft, nontender, nondistended. No hepatosplenomegaly. EXTREMITIES:  Without cyanosis, clubbing, or edema. NEUROLOGIC EXAMINATION:  The patient is somnolent, but arousable.  She can respond to questions appropriately.  Her judgment is clear  at this time.  She is able to move all 4 of her extremities and there are no focal deficits on examination.  On interview, she denies any suicidal ideation.  LABORATORY STUDIES:  White blood cell count 6.2, hemoglobin 12.6, hematocrit 38.9, platelets 306, neutrophils 56%, lymphocytes 36%. Sodium 139, potassium 2.8, chloride 102, carbon dioxide 25, BUN 4, creatinine 0.84, glucose 97.  Urinalysis; small leukocyte  esterase, trace hemoglobin.  Urine microscopic; many urine epithelials, so this is a contamination, urine white blood cells 3-6, urine red blood cells 0-2, many bacteria.  Urine drug screen positive for amphetamines and positive for benzodiazepine.  Acetaminophen level less than 15.0.  Salicylate level less than 2.0.  Alcohol level less than 11.  ASSESSMENT:  35 year old female being admitted with: 1. Intentional overdose. 2. Sinus tachycardia secondary to intentional overdose. 3. Severe depression. 4. Hypokalemia. 5. Bipolar disorder. 6. History of asthma. 7. Migraine headaches. 8. Anxiety.  PLAN:  The patient will be admitted to telemetry area at this time for monitoring.  Her regular medications will be held at this time.  They will also be further verified. The patient's potassium will be replaced and a magnesium level will also be checked and this will be repleted if needed.  A one-to-one sitter has been requested for this patient until the patient is medically cleared by Psychiatry or Behavioral Health.  Behavioral Health Services will be consulted to see this patient as well tomorrow.  DVT prophylaxis has been ordered and the patient is a full code.     Della Goo, M.D.     HJ/MEDQ  D:  11/22/2010  T:  11/22/2010  Job:  [id number]  Electronically Signed by Della Goo M.D. on 11/28/2010 10:25:54 PM

## 2010-12-01 NOTE — Discharge Summary (Signed)
Stacey Weaver, Stacey Weaver                ACCOUNT NO.:  [id number]  MEDICAL RECORD NO.:  [id number]  LOCATION:  1521                         FACILITY:  [facility]  PHYSICIAN:  Calvert Cantor, M.D.     DATE OF BIRTH:  04-Oct-1975  DATE OF ADMISSION:  11/21/2010 DATE OF DISCHARGE:  11/24/2010                              DISCHARGE SUMMARY   PRIMARY CARE PHYSICIAN:  None.  The patient had a psychiatrist at [facility] by the name of Dr. Olen Pel.  PRESENTING COMPLAINT:  Overdose on medication.  DISCHARGE DIAGNOSIS: 1. Overdose on Wellbutrin 12 tablets and the patient also took one 25-     mg Seroquel.  PAST MEDICAL HISTORY: 1. Bipolar disorder. 2. Migraine headaches. 3. Asthma. 4. Obesity.  DISCHARGE MEDICATIONS:  Seroquel has been increased from 25-50 mg daily as per recommendation by Dr. Elsie Saas from Psychiatry.  The patient can continue all other home meds as follows. 1. Lamictal 25 mg q.h.s. Abilify 5 mg q.h.s. 2. Klonopin 0.5 mg b.i.d. as needed. 3. Wellbutrin XL 300 mg daily. 4. Excedrin Migraine 2 tab as needed for migraine. 5. Motrin 200 mg 4 tablets every 8 hours as needed for pain. 6. Albuterol inhaler 1-2 puffs q.4 h. as needed for shortness of     breath.  HOSPITAL COURSE:  This is a 35 year old female who presented with above- mentioned complaint.  She took 12 of her Wellbutrin, which are XL and 300 mg each and one 25 mg Seroquel.  She states she had no suicidal intentions.  She was noted to be lethargic in the ER and was admitted. She was tachycardic as well and monitored on telemetry.  Potassium was low and was replaced.  The following day, the patient was awake, alert but still complained of some dizziness and had trouble ambulating because of her dizziness.  Today, she is stating that her dizziness is really quite mild and she is no longer having any trouble ambulating. She has had no other complaints for me during this hospital stay.  A psychiatric eval was  requested.  The patient was seen by Dr. Elsie Saas on November 22, 2010.  According to his recommendations, Seroquel should be increased from 25-50 mg at bedtime for sleep.  He also recommends 0.5 mg of Klonopin 1 pill twice a day for restlessness and anxiety.  Consults further goes on to state that the patient needs family support month or psychosocial support and medication adjustment until stable.  He did discontinue her sitter.  He has evaluated her today and states that she is okay for discharge.  In regards to hypokalemia, we have replaced this adequately; the last checked yesterday potassium was 3.5.  On admission, it was quite low at 2.8.  PHYSICAL EXAMINATION:  GENERAL: The patient is awake, alert, oriented x3.  Mood and affect is normal.  She is talkative. LUNGS:  Clear bilaterally. HEART: Regular rate and rhythm.  No murmurs. ABDOMEN: Obese, soft, nontender, nondistended.  Bowel sounds positive. Extremities: No cyanosis, clubbing or edema.  PERTINENT LABORATORY WORK:  Not mentioned above.  Drug screen was positive for amphetamines and benzodiazepines.  CONDITION ON DISCHARGE:  Stable.  She is advised to follow up  with her psychiatrist in 1-2 weeks.  She does have a therapist that she talks to regularly and will be returning to go live with her mother.  Discharge summary time on patient's care 45 minutes.     Calvert Cantor, M.D.     SR/MEDQ  D:  11/24/2010  T:  11/24/2010  Job:  [id number]  cc:   [facility]  Electronically Signed by Calvert Cantor M.D. on 12/01/2010 02:22:11 PM

## 2010-12-03 NOTE — Consult Note (Signed)
NAMEACCALIA, Stacey Weaver                ACCOUNT NO.:  [id number]  MEDICAL RECORD NO.:  [id number]  LOCATION:  1521                         FACILITY:  [facility]  PHYSICIAN:  Conni Slipper, MDDATE OF BIRTH:  1976-03-18  DATE OF CONSULTATION:  11/22/2010 DATE OF DISCHARGE:                                CONSULTATION   This is a 35 year old separated African American female, admitted to the [facility] Floor for suicidal attempt with overdose on antidepressant medication, Wellbutrin XL 300 mg x12 pills.  The patient has been suffering with bipolar disorder over 5 years and has been receiving outpatient psychiatric services.  The patient reported that she has been depressed over 2 months, and she has been thinking about overdosing her medication for a long time.  The patient reported she was separated from her husband for multiple reasons in February 2012 and moved to her mom's home.  Her mom is a hairdresser who does not get along with her.  The patient reported all her family members were upset and angry with her suicidal attempt.  The patient reported that she was unable to work for the last 1 year and had been on disability.  She is a CMA, used to work in the past.  The patient stated she has no children. The patient reported that she has acted on her thoughts about overdosing and with the intention to hurt herself or sleep off, but not with the intention of killing herself.  The patient reported that her stomach started hurting really bad.  She called her therapist, Christen Bame, who called 911.  The patient reported she has been kind of out of faith when she was talking with the therapist.  The patient reportedly continued to have drowsiness, dizziness, tachycardia, but no apparent epileptic seizures at this time.  The patient reported she was restless, anxious, worried, and upset about her family being angry with her.  The patient has been depressed and tearful during this  evaluation.  PAST PSYCHIATRIC HISTORY:  The patient has been treated by Dr. Tomasa Rand for about 4 years, and last 1 year she has been receiving services from the [facility].  Her primary psychiatrist is Dr. Berna Bue.  MEDICATIONS:  Her current medications were: 1. Abilify 5 mg at bedtime. 2. Lamictal 25 mg at bedtime. 3. Wellbutrin XL 300 mg daily morning. 4. She also takes Seroquel 25 mg at bedtime and Klonopin 1 mg as     needed.  MEDICAL HISTORY:  The patient has migraine headaches and asthma.  PSYCHOSOCIAL HISTORY:  The patient denied using drugs and alcohol or smoking.  The patient reportedly is living with her mom since February since separated from the husband and on disability, unable to work for the last 1 year.  She does not have any children.  She has a sister/aunt who killed herself with overdose, but no known diagnosis of mental health.  MENTAL STATUS EXAMINATION:  The patient appeared as per stated age, casually lying down on her bed with the hospital gown on bedsheet.  She has an IV line available, IV line is running, and she has one-to-one person next to her.  The patient was able  to sit down, calm and cooperative during this evaluation.  Her stated mood was depressed, more frustrated, and restless.  Her affect was appropriate.  She was dysphoric when I recommended further treatment of going into the hospital.  She has denied current suicidal or homicidal ideation.  She has a linear and goal-directed thought process.  She has no evidence of hallucinations, delusions, or paranoia.  She has a fair insight, judgment, and impulse control.  DIAGNOSES:  Bipolar disorder, most recent episode is depression and status post overdose on Wellbutrin.  AXIS III:  Asthma and migraine headaches.  AXIS IV:  Problems with primary support, separation from the husband, financial difficulties, unemployment, poor coping skills.  AXIS V:  Global Assessment of Functioning was  less than 30.  TREATMENT PLAN:  The patient had a recent acute intentional overdose on her medication.  Recommended inpatient psychiatric hospitalization for safety and secure therapeutic milieu and appropriate medication adjustment.  The patient needed family support, much more psychosocial support, and the patient needs medication adjustment upon stable.  She needs to be closely monitored for the epileptic seizures.  Recommend Klonopin 0.5 mg 1 pill twice daily for restlessness and anxiety. Recommended Seroquel 50 mg 1 pill at bedtime for agitation or sleep. Recommended no Wellbutrin during this hospitalization.  She may take her migraine medication if needed.     Conni Slipper, MD     JRJ/MEDQ  D:  11/22/2010  T:  11/23/2010  Job:  [id number]  Electronically Signed by Leata Mouse MD on 12/03/2010 01:49:46 PM

## 2010-12-21 LAB — CBC
HCT: 38.2
Hemoglobin: 12.9
MCHC: 33.7
RDW: 12

## 2010-12-21 LAB — TSH: TSH: 2.383

## 2010-12-21 LAB — MAGNESIUM: Magnesium: 2

## 2010-12-21 LAB — COMPREHENSIVE METABOLIC PANEL
BUN: 1 — ABNORMAL LOW
Calcium: 9.6
Glucose, Bld: 93
Sodium: 138
Total Protein: 7.8

## 2010-12-22 ENCOUNTER — Emergency Department (HOSPITAL_COMMUNITY)
Admission: EM | Admit: 2010-12-22 | Discharge: 2010-12-22 | Disposition: A | Discharge status: Other Acute Inpatient Hospital | Payer: Self-pay | Attending: Emergency Medicine | Admitting: Emergency Medicine

## 2010-12-22 ENCOUNTER — Inpatient Hospital Stay (HOSPITAL_COMMUNITY)
Admission: AD | Admit: 2010-12-22 | Discharge: 2010-12-31 | DRG: 885 | Disposition: A | Discharge status: Home / Self Care | Payer: PRIVATE HEALTH INSURANCE | Source: Ambulatory Visit | Attending: Psychiatry | Admitting: Psychiatry

## 2010-12-22 DIAGNOSIS — F3130 Bipolar disorder, current episode depressed, mild or moderate severity, unspecified: Principal | ICD-10-CM

## 2010-12-22 DIAGNOSIS — J45909 Unspecified asthma, uncomplicated: Secondary | ICD-10-CM

## 2010-12-22 DIAGNOSIS — F411 Generalized anxiety disorder: Secondary | ICD-10-CM | POA: Insufficient documentation

## 2010-12-22 DIAGNOSIS — G43909 Migraine, unspecified, not intractable, without status migrainosus: Secondary | ICD-10-CM

## 2010-12-22 DIAGNOSIS — R45851 Suicidal ideations: Secondary | ICD-10-CM

## 2010-12-22 LAB — CBC
HCT: 41.5 % (ref 36.0–46.0)
Hemoglobin: 13.2 g/dL (ref 12.0–15.0)
MCV: 86.6 fL (ref 78.0–100.0)
RBC: 4.79 MIL/uL (ref 3.87–5.11)
WBC: 6.3 10*3/uL (ref 4.0–10.5)

## 2010-12-22 LAB — DIFFERENTIAL
Basophils Absolute: 0 10*3/uL (ref 0.0–0.1)
Basophils Relative: 1 % (ref 0–1)
Neutro Abs: 3.6 10*3/uL (ref 1.7–7.7)
Neutrophils Relative %: 57 % (ref 43–77)

## 2010-12-22 LAB — COMPREHENSIVE METABOLIC PANEL
ALT: <5 U/L (ref 0–35)
AST: 12 U/L (ref 0–37)
Albumin: 3.8 g/dL (ref 3.5–5.2)
Calcium: 9.4 mg/dL (ref 8.4–10.5)
GFR calc Af Amer: >60 mL/min (ref >60)
Glucose, Bld: 87 mg/dL (ref 70–99)
Sodium: 139 mEq/L (ref 135–145)
Total Protein: 8.4 g/dL — ABNORMAL HIGH (ref 6.0–8.3)

## 2010-12-22 LAB — RAPID URINE DRUG SCREEN, HOSP PERFORMED
Amphetamines: NOT DETECTED
Barbiturates: NOT DETECTED
Benzodiazepines: NOT DETECTED
Tetrahydrocannabinol: NOT DETECTED

## 2010-12-23 DIAGNOSIS — F319 Bipolar disorder, unspecified: Secondary | ICD-10-CM

## 2010-12-28 DIAGNOSIS — F3130 Bipolar disorder, current episode depressed, mild or moderate severity, unspecified: Secondary | ICD-10-CM

## 2010-12-30 NOTE — Assessment & Plan Note (Signed)
Stacey Weaver, Stacey Weaver                ACCOUNT NO.:  [id number]  MEDICAL RECORD NO.:  [id number]  LOCATION:  0403                          [facility]ILITY[facility]  [facility]  PHYSICIAN:  Eulogio Ditch, MD DATE OF BIRTH:  11-14-1975  DATE OF ADMISSION:  12/22/2010 DATE OF DISCHARGE:                      PSYCHIATRIC ADMISSION ASSESSMENT   DATE OF ASSESSMENT:  December 23, 2010, at 11:20 a.m.  IDENTIFYING INFORMATION:  This is a 35 year old, divorced female.  This is a voluntary admission.  HISTORY OF THE PRESENT ILLNESS:  Stacey Weaver presents today with a chief complaint of "strong suicidal thoughts."  She reports that she has been pretty depressed for at least 2 months and was discharged from our medical unit about 1 month ago after taking an overdose of about 12 tablets of Wellbutrin XL 150 mg.  She reports a history of bipolar disorder that is characterized by infrequent episodes of mood elevation where she becomes more emotionally excited, more personable, much more verbal, talks more quickly, much more social, usually out of the house visiting and remaining active.  These periods usually last for about 2 days, and the last occurred 1 month ago.  Since that time, she has been in a down mood with crying episodes daily, thoughts of hurting herself including some intrusive thoughts, greatly decreased concentration, poor appetite, significant anhedonia with thought blocking and a lot of anxiety.  She reports this has been brought on by a lot of financial stressors, and she is unhappy living in the home with her mother with whom she does not get along.  She initially presented in the emergency room with vague suicidal thoughts and was referred for concerns about her safety.  She reports that she has not taken medications since she was discharged from our medical unit about a month ago because even though her MD has given her prescriptions to get filled, she frequently loses track of what she is doing  because of poor concentration and has lost prescriptions.  PAST PSYCHIATRIC HISTORY:  Currently followed by [facility] team and Dr. Clyde Canterbury, her psychiatrist.  She has a history of bipolar disorder diagnosed about 5 years ago.  She does have a history of suicidal thoughts and suicide attempt by overdose.  Past medications include Depakote which she had taken for many years and which caused weight gain and cognitive blunting; Risperdal which she felt did not help her at all and Haldol which worked very well for her.  She continues on Abilify now, because she does not have to pay any copay for the Abilify as she did with the Haldol.  Her past history is sketchy due to poor concentration, and she appears internally distracted.  She denies a history of brain injury or blackout.  She denies a history of physical or sexual abuse.  SOCIAL HISTORY:  She is a single [place] American female.  Was previously married and separated from her husband in February of 2012. She currently lives with her mother but reports that they do not get along well.  Reports she spends most of her time at home, reclusive to her room.  She would like to get back to work but has had difficulty with concentration and  feels unable to follow through with a job.  She is on disability for bipolar disorder.  She has no children.  No legal problems.  FAMILY HISTORY:  Paternal aunt with history of bipolar disorder.  Sister died of an unintentional overdose of Percocet.  ALCOHOL AND DRUG HISTORY:  Used to drink alcohol socially.  Now reports no substance abuse, and no past history of substance abuse.  MEDICAL HISTORY: 1. No regular primary care provider. 2. Medical problems are migraine headaches and asthma. 3. Past medical history of asthma episodes occasionally with exertion     and with exposure to cold weather. 4. Current medications are none.  RECENT MEDICATIONS:  Include: 1. Lamotrigine 25 mg  q.h.s. 2. Wellbutrin XL 300 mg 1 tablet q.a.m. 3. Abilify 5 mg q.h.s. 4. Quetiapine 25-50 mg h.s. p.r.n. for insomnia. 5. Klonopin 1 mg.  She takes 2 mg as needed which is 2-3 times per     month when she is unable to sleep at night, and the Seroquel does     not work. 6. She takes Excedrin Migraine and Midrin as needed for headaches. 7. Albuterol MDI 2 puffs q.4 hours p.r.n. for asthma.  DRUG ALLERGIES: 1. AXERT. 2. ZOMIG. 3. OTHER MIGRAINE HEADACHE MEDICINES. 4. CYMBALTA has caused an increase in suicidal thoughts.  POSITIVE PHYSICAL FINDINGS:  This is an overweight, African American female, normally developed with adequate grooming.  Fully oriented with blunt affect.  Weighs 95 kg,  5 feet 4 inches tall.  Temperature 98, pulse 88, respirations 17, blood pressure 95/61.  Her CBC:  WBC 6.3, hemoglobin 13.2, hematocrit 41.5, platelets 328,000.  Chemistry:  Sodium 139, potassium 3.4, chloride 104, carbon dioxide 27, BUN 5, creatinine 0.72 and random glucose 87.  Urine pregnancy test is negative.  Urine drug screen is negative for all substances.  Alcohol screen is negative.  MENTAL STATUS EXAM:  A fully alert female with very poor eye contact, flat affect, mild psychomotor slowing with mild response latency of about 2-3 seconds from time to time during the conversation.  She does not appear to be internally distracted but rather, having some thought blocking.  Becomes silently tearful during the exam.  Endorses having intrusive thoughts from time to time, racing thoughts, difficulty concentrating.  Endorses current suicidal thoughts without a plan.  He is able to contract for safety but is concerned that she cannot tolerate group therapy at this time.  Answers questions but does not initiate any conversation.  Memory seems intact.  Fully oriented.  No delusional statements made.  Axis I:  Bipolar disorder type 1, depressed. Axis II:  Deferred. Axis III:  Migraine headaches,  asthma. Axis IV:  Chronic mild domestic discord, financial issues. Axis V:  Current 37, past year not known.  PLAN:  We will voluntarily admit her.  We have talked about going back to Haldol versus the Abilify to control her significant depression and acute suicidal thoughts.  She would prefer to remain on the Abilify.  We will give her 5 mg now and 10 mg q.h.s. and 2 mg of Klonopin now to control her suicidal thoughts.  We will restart her Lamictal at 25 mg daily and for today, we will hold off on the Wellbutrin until we can gauge her progress and response to these medications.  We will place her on Excedrin Migraine and also give p.r.n. for migraine headaches and give her an albuterol MDI 2 puffs as needed for any asthma symptoms.  Margaret A. Lorin Picket, N.P.   ______________________________ Eulogio Ditch, MD    MAS/MEDQ  D:  12/23/2010  T:  12/23/2010  Job:  [id number]  Electronically Signed by Kari Baars N.P. on 12/24/2010 09:07:14 AM Electronically Signed by Eulogio Ditch  on 12/30/2010 12:24:13 PM

## 2011-01-07 NOTE — Discharge Summary (Signed)
Stacey Weaver, PAULES                ACCOUNT NO.:  [id number]  MEDICAL RECORD NO.:  [id number]  LOCATION:  0403                          [facility]ACILITY:  [facility]  PHYSICIAN:  Eulogio Ditch, MD DATE OF BIRTH:  Feb 19, 1976  DATE OF ADMISSION:  12/22/2010 DATE OF DISCHARGE:  12/31/2010                              DISCHARGE SUMMARY   IDENTIFYING INFORMATION:  This a 35 year old divorced female.  This is a voluntary admission.  HISTORY OF PRESENT ILLNESS:  Latina initially presented with a chief complaint of "strong suicidal thoughts."  She reported that she had been pretty depressed for at least 2 months and had been discharged from our medical unit about 1 month ago after taking an overdose of 12 tablets of Wellbutrin XL 150 mg.  She reported a history of bipolar disorder characterized by rare episodes of mood elevation but much more commonly being in a depressed mood.  She had currently been in a down mood for about 1 month with frequent crying episodes, thoughts of hurting herself, some intrusive thoughts of suicide, a greatly decreased concentration, poor appetite, significant anhedonia with thought blocking, and a lot of anxiety.  She reported that this had been brought on by a lot of financial stressors and is unhappy living in the home with her mother, with whom she does not get along.  She had not taken any medications, and she was discharged from our medical unit about a month ago.  Even though her MD had given her prescriptions to get filled, she had been losing track of them and lost them in the home because of her poor concentration.  She is currently followed by [facility] team, and Dr. Clyde Canterbury, her psychiatrist.  She has been diagnosed with bipolar disorder for the past 5 years.  Has a history of previous suicide attempt by overdose.  Please see the admission assessment for full details.  MEDICAL EVALUATION:  She was medically evaluated in our emergency room where  her full physical exam was performed along with a review of systems.  This is an overweight African American female, normally developed with adequate grooming, fully oriented with a blunt affect. Vital signs were found to be normal, and diagnostic studies were unremarkable.  Urine drug screen negative for all substances.  A pregnancy test negative.  Alcohol screen negative.  COURSE OF HOSPITALIZATION:  She was admitted to our acute stabilization unit and given a provisional diagnosis of bipolar disorder type 1, depressed.  Chronic medical problems included migraine headaches and asthma.  She was started back on Excedrin Migraine as needed to handle any headaches.  We discussed the option of going back on Haldol that she had taken in the past versus going back on Abilify to control her significant depression and suicidal thoughts.  She ultimately decided that she wanted to remain on the Abilify, and we started her on 5 mg q.a.m. and 10 mg at bedtime.  She was also emergently given 2 mg of Klonopin to control her anxiety.  We restarted her Lamictal at 25 mg p.o. daily and elected to hold off on the Wellbutrin until we could gauge her progress on the Abilify and Klonopin.  Ultimately we elected to start her back on the Wellbutrin XL 300 mg p.o. q.a.m., and she was also given trazodone 100 mg p.o. at bedtime to help with insomnia.  By October 1, she still appeared quite depressed and was gauging her depression at 7 on a 1-10 scale with 10 being the worst depression and hopelessness at a score of 8/10.  She was having suicidal thoughts but felt she could be safe on the unit.  Lamictal was increased to 25 mg p.o. b.i.d.  By the third day she felt much better, was denying any suicidal thoughts, was less depressed and anxious, thinking logical and goal-directed.  Insight and judgment were improved, and she wanted a family meeting with her mother for psychoeducation of her mother.  She was  gauging her depression as 6 and her hopelessness of 6, denying any suicidal thoughts and requesting to go home.  Her suicide assessment gauged her risk at minimal.  DISCHARGE/PLAN:  Follow up with [facility] on October 5. They were going to come to her house and would call ahead of time.  DISCHARGE DIAGNOSES:  Bipolar disorder type 1, depressed.  AXIS II: Deferred.  AXIS III: Migraine headaches, resolved.  AXIS IV: Chronic domestic stressors, moderate.  AXIS V: Current 56, past year not known.  DISCHARGE CONDITION:  Stable and improved.  DISCHARGE MEDICATIONS: 1. Aripiprazole 5 mg q.a.m. and 10 mg at bedtime. 2. Clonazepam 2 mg twice daily as needed for anxiety. 3. Lamotrigine 25 mg twice daily. 4. Trazodone 100 mg at bedtime. 5. Albuterol inhaler 1-2 puffs q.4h. p.r.n. for asthma. 6. Excedrin Migraine, take according to package directions as needed     for migraine headache.  Ultimately we discontinued the Wellbutrin, and she was instructed to stop the Wellbutrin.     Margaret A. Lorin Picket, N.P.   ______________________________ Eulogio Ditch, MD    MAS/MEDQ  D:  12/31/2010  T:  01/01/2011  Job:  [id number]  Electronically Signed by Kari Baars N.P. on 01/04/2011 02:40:07 PM Electronically Signed by Eulogio Ditch  on 01/07/2011 08:41:45 AM

## 2011-02-25 ENCOUNTER — Emergency Department (HOSPITAL_COMMUNITY): Payer: Self-pay

## 2011-02-25 ENCOUNTER — Emergency Department (HOSPITAL_COMMUNITY)
Admission: EM | Admit: 2011-02-25 | Discharge: 2011-02-25 | Discharge status: Against Medical Advice | Payer: PRIVATE HEALTH INSURANCE | Attending: Emergency Medicine | Admitting: Emergency Medicine

## 2011-02-25 ENCOUNTER — Encounter: Payer: Self-pay | Admitting: *Deleted

## 2011-02-25 ENCOUNTER — Encounter (HOSPITAL_COMMUNITY): Payer: Self-pay | Admitting: *Deleted

## 2011-02-25 ENCOUNTER — Emergency Department (HOSPITAL_COMMUNITY)
Admission: EM | Admit: 2011-02-25 | Discharge: 2011-02-26 | Disposition: A | Discharge status: Home / Self Care | Payer: Self-pay | Attending: Emergency Medicine | Admitting: Emergency Medicine

## 2011-02-25 DIAGNOSIS — G43909 Migraine, unspecified, not intractable, without status migrainosus: Secondary | ICD-10-CM | POA: Insufficient documentation

## 2011-02-25 DIAGNOSIS — R0602 Shortness of breath: Secondary | ICD-10-CM | POA: Insufficient documentation

## 2011-02-25 DIAGNOSIS — R079 Chest pain, unspecified: Secondary | ICD-10-CM | POA: Insufficient documentation

## 2011-02-25 DIAGNOSIS — M25519 Pain in unspecified shoulder: Secondary | ICD-10-CM | POA: Insufficient documentation

## 2011-02-25 HISTORY — DX: Bipolar disorder, unspecified: F31.9

## 2011-02-25 LAB — BASIC METABOLIC PANEL
CO2: 24 mEq/L (ref 19–32)
Calcium: 9.9 mg/dL (ref 8.4–10.5)
Glucose, Bld: 86 mg/dL (ref 70–99)
Sodium: 136 mEq/L (ref 135–145)

## 2011-02-25 LAB — CBC
Hemoglobin: 13 g/dL (ref 12.0–15.0)
MCH: 27.4 pg (ref 26.0–34.0)
RBC: 4.75 MIL/uL (ref 3.87–5.11)

## 2011-02-25 MED ORDER — METOCLOPRAMIDE HCL 5 MG/ML IJ SOLN
10.0000 mg | Freq: Once | INTRAMUSCULAR | Status: AC
  Administered 2011-02-25: 10 mg via INTRAVENOUS
  Filled 2011-02-25: qty 2

## 2011-02-25 MED ORDER — DIPHENHYDRAMINE HCL 50 MG/ML IJ SOLN
25.0000 mg | Freq: Once | INTRAMUSCULAR | Status: AC
  Administered 2011-02-25: 25 mg via INTRAVENOUS
  Filled 2011-02-25: qty 1

## 2011-02-25 MED ORDER — DEXAMETHASONE SODIUM PHOSPHATE 10 MG/ML IJ SOLN
10.0000 mg | Freq: Once | INTRAMUSCULAR | Status: AC
  Administered 2011-02-25: 10 mg via INTRAVENOUS
  Filled 2011-02-25: qty 1

## 2011-02-25 NOTE — ED Notes (Signed)
Pt states no change from medication and pain assessment.

## 2011-02-25 NOTE — ED Notes (Signed)
Pt with 2 day hx of "stabbing" CP that increases with deep inspiration.  Pt has had some sob with this.  Pt has pain in her left shoulder with this.  Pt also reports migraine headache with this.  Pain increases with palpation to chest.  No swelling to any of her extremities.

## 2011-02-25 NOTE — ED Provider Notes (Signed)
History     CSN: [id number] Arrival date & time: 02/25/2011  7:03 PM   First MD Initiated Contact with Patient 02/25/11 2131      Chief Complaint  Patient presents with  . Chest Pain    (Consider location/radiation/quality/duration/timing/severity/associated sxs/prior treatment) HPI Comments: Pt with 2 days hx of left side achy chest pain.  SOB at times.  Non-pleuritic, non-exertional.  No hx of heart problems.  +FH for early CAD.  No leg pain or swelling.  Has been constant since yesterday.  Has had cough recently, non-productive.  Worse with coughing.  Also c/o headache x 1 week.  Same type of pain as past migraines.  No unusual or different symptoms  Patient is a 35 y.o. female presenting with chest pain.  Chest Pain The chest pain began yesterday. Chest pain occurs constantly. The chest pain is unchanged. The severity of the pain is moderate. The quality of the pain is described as aching. The pain radiates to the left shoulder. Chest pain is worsened by certain positions. Pertinent negatives for primary symptoms include no fever, no fatigue, no shortness of breath, no cough, no abdominal pain, no nausea, no vomiting and no dizziness.  Pertinent negatives for associated symptoms include no diaphoresis, no numbness and no weakness.     Past Medical History  Diagnosis Date  . Bipolar 1 disorder   . Asthma   . Migraine     Past Surgical History  Procedure Date  . Knee surgery     No family history on file.  History  Substance Use Topics  . Smoking status: Never Smoker   . Smokeless tobacco: Not on file  . Alcohol Use: No    OB History    Grav Para Term Preterm Abortions TAB SAB Ect Mult Living                  Review of Systems  Constitutional: Negative for fever, chills, diaphoresis and fatigue.  HENT: Negative for congestion, rhinorrhea and sneezing.   Eyes: Negative.   Respiratory: Negative for cough, chest tightness and shortness of breath.     Cardiovascular: Positive for chest pain. Negative for leg swelling.  Gastrointestinal: Negative for nausea, vomiting, abdominal pain, diarrhea and blood in stool.  Genitourinary: Negative for frequency, hematuria, flank pain and difficulty urinating.  Musculoskeletal: Negative for back pain and arthralgias.  Skin: Negative for rash.  Neurological: Negative for dizziness, speech difficulty, weakness, numbness and headaches.    Allergies  Axert; Zomig; and Cymbalta  Home Medications   Current Outpatient Rx  Name Route Sig Dispense Refill  . ACETAMINOPHEN 500 MG PO TABS Oral Take 1,000 mg by mouth every 6 (six) hours as needed.      . ARIPIPRAZOLE 5 MG PO TABS Oral Take 5 mg by mouth at bedtime.      . ASPIRIN-ACETAMINOPHEN-CAFFEINE 250-250-65 MG PO TABS Oral Take 2 tablets by mouth every 6 (six) hours as needed.      [name] CLONAZEPAM 1 MG PO TABS Oral Take 1 mg by mouth daily as needed. For anxiety     . LAMOTRIGINE 25 MG PO TABS Oral Take 25 mg by mouth at bedtime.      . TRAZODONE HCL 100 MG PO TABS Oral Take 100 mg by mouth at bedtime.      . IBUPROFEN 200 MG PO TABS Oral Take 800 mg by mouth every 6 (six) hours as needed.        BP 105/61  Pulse  86  Temp(Src) 98.2 F (36.8 C) (Oral)  Resp 16  SpO2 100%  Physical Exam  Constitutional: She is oriented to person, place, and time. She appears well-developed and well-nourished.  HENT:  Head: Normocephalic and atraumatic.  Eyes: Pupils are equal, round, and reactive to light.  Neck: Normal range of motion. Neck supple.  Cardiovascular: Normal rate, regular rhythm and normal heart sounds.   Pulmonary/Chest: Effort normal and breath sounds normal. No respiratory distress. She has no wheezes. She has no rales. She exhibits tenderness.  Abdominal: Soft. Bowel sounds are normal. There is no tenderness. There is no rebound and no guarding.  Musculoskeletal: Normal range of motion. She exhibits no edema.  Lymphadenopathy:    She has no  cervical adenopathy.  Neurological: She is alert and oriented to person, place, and time.  Skin: Skin is warm and dry. No rash noted.  Psychiatric: She has a normal mood and affect.    ED Course  Procedures (including critical care time)  Results for orders placed during the hospital encounter of 02/25/11  CBC      Component Value Range   WBC 8.6  4.0 - 10.5 (K/uL)   RBC 4.75  3.87 - 5.11 (MIL/uL)   Hemoglobin 13.0  12.0 - 15.0 (g/dL)   HCT 82.9  56.2 - 13.0 (%)   MCV 86.5  78.0 - 100.0 (fL)   MCH 27.4  26.0 - 34.0 (pg)   MCHC 31.6  30.0 - 36.0 (g/dL)   RDW 86.5  78.4 - 69.6 (%)   Platelets 244  150 - 400 (K/uL)  BASIC METABOLIC PANEL      Component Value Range   Sodium 136  135 - 145 (mEq/L)   Potassium 3.0 (*) 3.5 - 5.1 (mEq/L)   Chloride 102  96 - 112 (mEq/L)   CO2 24  19 - 32 (mEq/L)   Glucose, Bld 86  70 - 99 (mg/dL)   BUN 6  6 - 23 (mg/dL)   Creatinine, Ser 2.95  0.50 - 1.10 (mg/dL)   Calcium 9.9  8.4 - 28.4 (mg/dL)   GFR calc non Af Amer >90  >90 (mL/min)   GFR calc Af Amer >90  >90 (mL/min)  TROPONIN I      Component Value Range   Troponin I <0.30  <0.30 (ng/mL)   Dg Chest 2 View  02/25/2011  *RADIOLOGY REPORT*  Clinical Data:  Chest pain.  CHEST - 2 VIEW  Comparison: 05/25/2006  Findings: The heart size and mediastinal contours are within normal limits.  Both lungs are clear.  The visualized skeletal structures are unremarkable.  IMPRESSION: No active disease.  Original Report Authenticated By: Reola Calkins, M.D.   Date: 02/26/2011  Rate: 92  Rhythm: normal sinus rhythm  QRS Axis: normal  Intervals: normal  ST/T Wave abnormalities: normal  Conduction Disutrbances:none  Narrative Interpretation:   Old EKG Reviewed: unchanged     Dg Chest 2 View  02/25/2011  *RADIOLOGY REPORT*  Clinical Data:  Chest pain.  CHEST - 2 VIEW  Comparison: 05/25/2006  Findings: The heart size and mediastinal contours are within normal limits.  Both lungs are clear.  The  visualized skeletal structures are unremarkable.  IMPRESSION: No active disease.  Original Report Authenticated By: Reola Calkins, M.D.     1. Chest pain   2. Migraine       MDM  Pt with chest pain, reproducible on palpation.  Doubt ACS, no evidence of pneumonia.  Treated for  migraine.  Will refer back to her PMD for re-evaluation        Rolan Bucco, MD 02/26/11 0101

## 2011-02-25 NOTE — ED Notes (Signed)
Pt reports chest pain to left shoulder, was told by her Psy doctor to come to the ED, seen at [facility] earlier but left prior to being seen, pt states she is [facility] but shows no signs in triage

## 2011-02-25 NOTE — ED Notes (Signed)
Pt. Reports head and chest pain.  The chest pain she rates as an 8, the head pain a 10.  She reports having the frontal headache which is centered above her left eye for one week.  She reports having a history of migraine headaches.  Chest pain is radiating to her left arm with SOB that comes and goes. Pt. Reports some nausea but no vomiting.

## 2011-02-26 MED ORDER — HYDROCODONE-ACETAMINOPHEN 5-325 MG PO TABS
2.0000 | ORAL_TABLET | Freq: Once | ORAL | Status: AC
  Administered 2011-02-26: 2 via ORAL
  Filled 2011-02-26: qty 2

## 2011-06-14 ENCOUNTER — Emergency Department (INDEPENDENT_AMBULATORY_CARE_PROVIDER_SITE_OTHER): Payer: Self-pay

## 2011-06-14 ENCOUNTER — Encounter (HOSPITAL_BASED_OUTPATIENT_CLINIC_OR_DEPARTMENT_OTHER): Payer: Self-pay | Admitting: *Deleted

## 2011-06-14 ENCOUNTER — Emergency Department (HOSPITAL_BASED_OUTPATIENT_CLINIC_OR_DEPARTMENT_OTHER)
Admission: EM | Admit: 2011-06-14 | Discharge: 2011-06-14 | Disposition: A | Discharge status: Home / Self Care | Payer: Self-pay | Attending: Emergency Medicine | Admitting: Emergency Medicine

## 2011-06-14 DIAGNOSIS — R42 Dizziness and giddiness: Secondary | ICD-10-CM

## 2011-06-14 DIAGNOSIS — F319 Bipolar disorder, unspecified: Secondary | ICD-10-CM | POA: Insufficient documentation

## 2011-06-14 DIAGNOSIS — R51 Headache: Secondary | ICD-10-CM

## 2011-06-14 DIAGNOSIS — W19XXXA Unspecified fall, initial encounter: Secondary | ICD-10-CM

## 2011-06-14 DIAGNOSIS — J45909 Unspecified asthma, uncomplicated: Secondary | ICD-10-CM | POA: Insufficient documentation

## 2011-06-14 DIAGNOSIS — R55 Syncope and collapse: Secondary | ICD-10-CM | POA: Insufficient documentation

## 2011-06-14 DIAGNOSIS — W1809XA Striking against other object with subsequent fall, initial encounter: Secondary | ICD-10-CM | POA: Insufficient documentation

## 2011-06-14 DIAGNOSIS — N390 Urinary tract infection, site not specified: Secondary | ICD-10-CM | POA: Insufficient documentation

## 2011-06-14 DIAGNOSIS — M546 Pain in thoracic spine: Secondary | ICD-10-CM | POA: Insufficient documentation

## 2011-06-14 DIAGNOSIS — M549 Dorsalgia, unspecified: Secondary | ICD-10-CM

## 2011-06-14 LAB — URINALYSIS, ROUTINE W REFLEX MICROSCOPIC
Glucose, UA: NEGATIVE mg/dL
Nitrite: NEGATIVE
Urobilinogen, UA: 1 mg/dL (ref 0.0–1.0)

## 2011-06-14 LAB — BASIC METABOLIC PANEL
CO2: 26 mEq/L (ref 19–32)
Calcium: 9.3 mg/dL (ref 8.4–10.5)
Creatinine, Ser: 1 mg/dL (ref 0.50–1.10)
Glucose, Bld: 98 mg/dL (ref 70–99)

## 2011-06-14 LAB — DIFFERENTIAL
Eosinophils Relative: 1 % (ref 0–5)
Lymphocytes Relative: 20 % (ref 12–46)
Lymphs Abs: 1.3 10*3/uL (ref 0.7–4.0)
Monocytes Absolute: 0.5 10*3/uL (ref 0.1–1.0)
Monocytes Relative: 8 % (ref 3–12)

## 2011-06-14 LAB — CBC
HCT: 41.1 % (ref 36.0–46.0)
MCV: 84.9 fL (ref 78.0–100.0)
RBC: 4.84 MIL/uL (ref 3.87–5.11)
RDW: 13.2 % (ref 11.5–15.5)
WBC: 6.5 10*3/uL (ref 4.0–10.5)

## 2011-06-14 LAB — URINE MICROSCOPIC-ADD ON: RBC / HPF: NONE SEEN RBC/hpf (ref <3)

## 2011-06-14 MED ORDER — CIPROFLOXACIN HCL 500 MG PO TABS: 500.0000 mg | ORAL_TABLET | Freq: Two times a day (BID) | ORAL | Status: AC

## 2011-06-14 MED ORDER — HYDROCODONE-ACETAMINOPHEN 5-500 MG PO TABS: 1.0000 | ORAL_TABLET | Freq: Four times a day (QID) | ORAL | Status: AC | PRN

## 2011-06-14 NOTE — ED Notes (Signed)
Patient transported to CT 

## 2011-06-14 NOTE — ED Notes (Signed)
Pt states she is feeling much better blood pressure checked while standing up

## 2011-06-14 NOTE — Discharge Instructions (Signed)
Syncope  You have had a fainting (syncopal) spell. A fainting episode is a sudden, short-lived loss of consciousness. It results in complete recovery. It occurs because there has been a temporary shortage of oxygen and/or sugar (glucose) to the brain.  CAUSES    Blood pressure pills and other medications that may lower blood pressure below normal. Sudden changes in posture (sudden standing).   Over-medication. Take your medications as directed.   Standing too long. This can cause blood to pool in the legs.   Seizure disorders.   Low blood sugar (hypoglycemia) of diabetes. This more commonly causes coma.   Bearing down to go to the bathroom. This can cause your blood pressure to rise suddenly. Your body compensates by making the blood pressure too low when you stop bearing down.   Hardening of the arteries where the brain temporarily does not receive enough blood.   Irregular heart beat and circulatory problems.   Fear, emotional distress, injury, sight of blood, or illness.  Your caregiver will send you home if the syncope was from non-worrisome causes (benign). Depending on your age and health, you may stay to be monitored and observed. If you return home, have someone stay with you if your caregiver feels that is desirable.  It is very important to keep all follow-up referrals and appointments in order to properly manage this condition. This is a serious problem which can lead to serious illness and death if not carefully managed.   WARNING: Do not drive or operate machinery until your caregiver feels that it is safe for you to do so.  SEEK IMMEDIATE MEDICAL CARE IF:    You have another fainting episode or faint while lying or sitting down. DO NOT DRIVE YOURSELF. Call 911 if no other help is available.   You have chest pain, are feeling sick to your stomach (nausea), vomiting or abdominal pain.   You have an irregular heartbeat or one that is very fast (pulse over 120 beats per minute).   You have a  loss of feeling in some part of your body or lose movement in your arms or legs.   You have difficulty with speech, confusion, severe weakness, or visual problems.   You become sweaty and/or feel light headed.  Make sure you are rechecked as instructed.  Document Released: 03/15/2005 Document Revised: 03/04/2011 Document Reviewed: 11/03/2006  [employer] Patient Information 2012 [employer]      Urinary Tract Infection  Infections of the urinary tract can start in several places. A bladder infection (cystitis), a kidney infection (pyelonephritis), and a prostate infection (prostatitis) are different types of urinary tract infections (UTIs). They usually get better if treated with medicines (antibiotics) that kill germs. Take all the medicine until it is gone. You or your child may feel better in a few days, but TAKE ALL MEDICINE or the infection may not respond and may become more difficult to treat.  HOME CARE INSTRUCTIONS    Drink enough water and fluids to keep the urine clear or pale yellow. Cranberry juice is especially recommended, in addition to large amounts of water.   Avoid caffeine, tea, and carbonated beverages. They tend to irritate the bladder.   Alcohol may irritate the prostate.   Only take over-the-counter or prescription medicines for pain, discomfort, or fever as directed by your caregiver.  To prevent further infections:   Empty the bladder often. Avoid holding urine for long periods of time.   After a bowel movement, women should cleanse   from front to back. Use each tissue only once.   Empty the bladder before and after sexual intercourse.  FINDING OUT THE RESULTS OF YOUR TEST  Not all test results are available during your visit. If your or your child's test results are not back during the visit, make an appointment with your caregiver to find out the results. Do not assume everything is normal if you have not heard from your caregiver or the medical facility. It is important for you  to follow up on all test results.  SEEK MEDICAL CARE IF:    There is back pain.   Your baby is older than 3 months with a rectal temperature of 100.5 F (38.1 C) or higher for more than 1 day.   Your or your child's problems (symptoms) are no better in 3 days. Return sooner if you or your child is getting worse.  SEEK IMMEDIATE MEDICAL CARE IF:    There is severe back pain or lower abdominal pain.   You or your child develops chills.   You have a fever.   Your baby is older than 3 months with a rectal temperature of 102 F (38.9 C) or higher.   Your baby is 3 months old or younger with a rectal temperature of 100.4 F (38 C) or higher.   There is nausea or vomiting.   There is continued burning or discomfort with urination.  MAKE SURE YOU:    Understand these instructions.   Will watch your condition.   Will get help right away if you are not doing well or get worse.  Document Released: 12/23/2004 Document Revised: 03/04/2011 Document Reviewed: 07/28/2006  [employer] Patient Information 2012 [employer]

## 2011-06-14 NOTE — ED Provider Notes (Signed)
History     CSN: [id number]  Arrival date & time 06/14/11  8413   First MD Initiated Contact with Patient 06/14/11 [phone]      Chief Complaint  Patient presents with  . Near Syncope    (Consider location/radiation/quality/duration/timing/severity/associated sxs/prior treatment) HPI Comments: Patient was in her house this morning. She started to feel dizzy and lightheaded, and was going into the kitchen to get herself some orange juice when she had a syncopal episode. She's not sure how long this lasted. She denied any other preceding illness. Illnesses. No chest pain no shortness of breath. No heart palpitations. No recent coughing or congestion. No UTI symptoms. No abdominal pain. She had a similar episode about 3 weeks ago. No history of syncope in the past. No family history of sudden cardiac death. She states that she feels much better now. She denies any dizziness. She just complains of a headache in the back of her head where she hit her head on the floor and also some upper back pain. Denies any numbness, or weakness in her extremities. Denies any neck pain.  Patient is a 36 y.o. female presenting with syncope. The history is provided by the patient.  Loss of Consciousness This is a new problem. The problem has been gradually improving. Pertinent negatives include no chest pain, no abdominal pain, no headaches and no shortness of breath.    Past Medical History  Diagnosis Date  . Bipolar 1 disorder   . Asthma   . Migraine     Past Surgical History  Procedure Date  . Knee surgery     History reviewed. No pertinent family history.  History  Substance Use Topics  . Smoking status: Never Smoker   . Smokeless tobacco: Not on file  . Alcohol Use: No    OB History    Grav Para Term Preterm Abortions TAB SAB Ect Mult Living                  Review of Systems  Constitutional: Negative for fever, chills, diaphoresis and fatigue.  HENT: Negative for congestion, rhinorrhea  and sneezing.   Eyes: Negative.   Respiratory: Negative for cough, chest tightness and shortness of breath.   Cardiovascular: Positive for syncope. Negative for chest pain and leg swelling.  Gastrointestinal: Negative for nausea, vomiting, abdominal pain, diarrhea and blood in stool.  Genitourinary: Negative for frequency, hematuria, flank pain and difficulty urinating.  Musculoskeletal: Positive for back pain. Negative for arthralgias.  Skin: Negative for rash.  Neurological: Positive for dizziness and syncope. Negative for seizures, facial asymmetry, speech difficulty, weakness, numbness and headaches.    Allergies  Axert; Zomig; and Cymbalta  Home Medications   Current Outpatient Rx  Name Route Sig Dispense Refill  . ACETAMINOPHEN 500 MG PO TABS Oral Take 1,000 mg by mouth every 6 (six) hours as needed.      . ARIPIPRAZOLE 5 MG PO TABS Oral Take 5 mg by mouth at bedtime.      . ASPIRIN-ACETAMINOPHEN-CAFFEINE 250-250-65 MG PO TABS Oral Take 2 tablets by mouth every 6 (six) hours as needed.      [name] CIPROFLOXACIN HCL 500 MG PO TABS Oral Take 1 tablet (500 mg total) by mouth every 12 (twelve) hours. 14 tablet 0  . CLONAZEPAM 1 MG PO TABS Oral Take 1 mg by mouth daily as needed. For anxiety     . HYDROCODONE-ACETAMINOPHEN 5-500 MG PO TABS Oral Take 1-2 tablets by mouth every 6 (six) hours as  needed for pain. 15 tablet 0  . IBUPROFEN 200 MG PO TABS Oral Take 800 mg by mouth every 6 (six) hours as needed.      [name] LAMOTRIGINE 25 MG PO TABS Oral Take 25 mg by mouth at bedtime.      . TRAZODONE HCL 100 MG PO TABS Oral Take 100 mg by mouth at bedtime.        BP 104/71  Pulse 84  Temp(Src) 97.4 F (36.3 C) (Oral)  Resp 20  Ht 5\' 4"  (1.626 m)  Wt 180 lb (81.647 kg)  BMI 30.90 kg/m2  SpO2 99%  Physical Exam  Constitutional: She is oriented to person, place, and time. She appears well-developed and well-nourished.  HENT:  Head: Normocephalic and atraumatic.  Right Ear: External ear  normal.  Left Ear: External ear normal.  Mouth/Throat: Oropharynx is clear and moist.  Eyes: Pupils are equal, round, and reactive to light.  Neck: Normal range of motion. Neck supple.       No pain on palpation of the cervical spine. There is some mild tenderness to the mid thoracic spine. No pain to the lumbosacral spine. No step-offs or deformities are noted.  Cardiovascular: Normal rate, regular rhythm and normal heart sounds.   Pulmonary/Chest: Effort normal and breath sounds normal. No respiratory distress. She has no wheezes. She has no rales. She exhibits no tenderness.  Abdominal: Soft. Bowel sounds are normal. There is no tenderness. There is no rebound and no guarding.  Musculoskeletal: Normal range of motion. She exhibits no edema.  Lymphadenopathy:    She has no cervical adenopathy.  Neurological: She is alert and oriented to person, place, and time. She has normal strength. No cranial nerve deficit or sensory deficit. Coordination normal. GCS eye subscore is 4. GCS verbal subscore is 5. GCS motor subscore is 6.  Skin: Skin is warm and dry. No rash noted.  Psychiatric: She has a normal mood and affect.    ED Course  Procedures (including critical care time) Results for orders placed during the hospital encounter of 06/14/11  CBC      Component Value Range   WBC 6.5  4.0 - 10.5 (K/uL)   RBC 4.84  3.87 - 5.11 (MIL/uL)   Hemoglobin 13.5  12.0 - 15.0 (g/dL)   HCT 10.9  60.4 - 54.0 (%)   MCV 84.9  78.0 - 100.0 (fL)   MCH 27.9  26.0 - 34.0 (pg)   MCHC 32.8  30.0 - 36.0 (g/dL)   RDW 98.1  19.1 - 47.8 (%)   Platelets 241  150 - 400 (K/uL)  DIFFERENTIAL      Component Value Range   Neutrophils Relative 72  43 - 77 (%)   Neutro Abs 4.7  1.7 - 7.7 (K/uL)   Lymphocytes Relative 20  12 - 46 (%)   Lymphs Abs 1.3  0.7 - 4.0 (K/uL)   Monocytes Relative 8  3 - 12 (%)   Monocytes Absolute 0.5  0.1 - 1.0 (K/uL)   Eosinophils Relative 1  0 - 5 (%)   Eosinophils Absolute 0.0  0.0 -  0.7 (K/uL)   Basophils Relative 0  0 - 1 (%)   Basophils Absolute 0.0  0.0 - 0.1 (K/uL)  BASIC METABOLIC PANEL      Component Value Range   Sodium 137  135 - 145 (mEq/L)   Potassium 4.0  3.5 - 5.1 (mEq/L)   Chloride 104  96 - 112 (mEq/L)   CO2  26  19 - 32 (mEq/L)   Glucose, Bld 98  70 - 99 (mg/dL)   BUN 7  6 - 23 (mg/dL)   Creatinine, Ser 1.19  0.50 - 1.10 (mg/dL)   Calcium 9.3  8.4 - 14.7 (mg/dL)   GFR calc non Af Amer 73 (*) >90 (mL/min)   GFR calc Af Amer 84 (*) >90 (mL/min)  URINALYSIS, ROUTINE W REFLEX MICROSCOPIC      Component Value Range   Color, Urine YELLOW  YELLOW    APPearance CLOUDY (*) CLEAR    Specific Gravity, Urine 1.029  1.005 - 1.030    pH 6.0  5.0 - 8.0    Glucose, UA NEGATIVE  NEGATIVE (mg/dL)   Hgb urine dipstick NEGATIVE  NEGATIVE    Bilirubin Urine SMALL (*) NEGATIVE    Ketones, ur 15 (*) NEGATIVE (mg/dL)   Protein, ur 30 (*) NEGATIVE (mg/dL)   Urobilinogen, UA 1.0  0.0 - 1.0 (mg/dL)   Nitrite NEGATIVE  NEGATIVE    Leukocytes, UA MODERATE (*) NEGATIVE   PREGNANCY, URINE      Component Value Range   Preg Test, Ur NEGATIVE  NEGATIVE   URINE MICROSCOPIC-ADD ON      Component Value Range   Squamous Epithelial / LPF FEW (*) RARE    WBC, UA 7-10  <3 (WBC/hpf)   RBC / HPF    <3 (RBC/hpf)   Value: NO FORMED ELEMENTS SEEN ON URINE MICROSCOPIC EXAMINATION   Bacteria, UA MANY (*) RARE    Dg Thoracic Spine 2 View  06/14/2011  *RADIOLOGY REPORT*  Clinical Data: Fall.  Head and upper back pain and tenderness.  THORACIC SPINE - 2 VIEW  Comparison: 02/25/2011 chest plain film.  05/25/2006 CT.  Findings: Normal paraspinous contours.  The upper thoracic views / swimmer's view demonstrates grossly maintained T1-T3 vertebral body height.  The first lateral image includes approximately T4-L1.  Mild spondylosis at these levels, without vertebral body height loss.  IMPRESSION:  No acute findings in the thoracic spine.  Evaluation of T1-T3 mildly degraded.  Original Report  Authenticated By: Consuello Bossier, M.D.   Ct Head Wo Contrast  06/14/2011  *RADIOLOGY REPORT*  Clinical Data: Dizziness headache.  Fall, pain in back of head.  CT HEAD WITHOUT CONTRAST  Technique:  Contiguous axial images were obtained from the base of the skull through the vertex without contrast.  Comparison: 06/01/2008 MRI.  03/27/2008 head CT.  Findings: Bone windows demonstrate no significant soft tissue swelling.  Mildly hypoplastic frontal sinuses.  Mild hyperostosis frontalis interna.  Soft tissue windows demonstrate mild cerebral atrophy for age. No mass lesion, hemorrhage, hydrocephalus, acute infarct, intra-axial, or extra-axial fluid collection.  IMPRESSION: No acute intracranial abnormality.  Original Report Authenticated By: Consuello Bossier, M.D.      1. UTI (lower urinary tract infection)   2. Syncope       MDM  Pt not orthostatic, labs okay, may be related to UTI, will treat with cipro.  Nothing that sounds cardiac, no suggestion of arrythmia.  Encouraged to f/u with PMD within one week or return here for any worsening symptoms.        Rolan Bucco, MD 06/14/11 1151

## 2011-08-07 ENCOUNTER — Inpatient Hospital Stay (HOSPITAL_COMMUNITY)
Admission: EM | Admit: 2011-08-07 | Discharge: 2011-08-09 | DRG: 918 | Disposition: A | Discharge status: Home / Self Care | Payer: Self-pay | Attending: Internal Medicine | Admitting: Internal Medicine

## 2011-08-07 ENCOUNTER — Encounter (HOSPITAL_COMMUNITY): Payer: Self-pay | Admitting: Emergency Medicine

## 2011-08-07 DIAGNOSIS — R000 Tachycardia, unspecified: Secondary | ICD-10-CM | POA: Diagnosis present

## 2011-08-07 DIAGNOSIS — IMO0002 Reserved for concepts with insufficient information to code with codable children: Secondary | ICD-10-CM | POA: Diagnosis present

## 2011-08-07 DIAGNOSIS — F411 Generalized anxiety disorder: Secondary | ICD-10-CM | POA: Diagnosis present

## 2011-08-07 DIAGNOSIS — Z6835 Body mass index (BMI) 35.0-35.9, adult: Secondary | ICD-10-CM | POA: Diagnosis present

## 2011-08-07 DIAGNOSIS — F32A Depression, unspecified: Secondary | ICD-10-CM | POA: Diagnosis present

## 2011-08-07 DIAGNOSIS — T43502A Poisoning by unspecified antipsychotics and neuroleptics, intentional self-harm, initial encounter: Secondary | ICD-10-CM | POA: Diagnosis present

## 2011-08-07 DIAGNOSIS — F419 Anxiety disorder, unspecified: Secondary | ICD-10-CM | POA: Diagnosis present

## 2011-08-07 DIAGNOSIS — M6282 Rhabdomyolysis: Secondary | ICD-10-CM | POA: Diagnosis present

## 2011-08-07 DIAGNOSIS — T450X4A Poisoning by antiallergic and antiemetic drugs, undetermined, initial encounter: Secondary | ICD-10-CM

## 2011-08-07 DIAGNOSIS — T450X1A Poisoning by antiallergic and antiemetic drugs, accidental (unintentional), initial encounter: Secondary | ICD-10-CM | POA: Diagnosis present

## 2011-08-07 DIAGNOSIS — T43221A Poisoning by selective serotonin reuptake inhibitors, accidental (unintentional), initial encounter: Secondary | ICD-10-CM

## 2011-08-07 DIAGNOSIS — F329 Major depressive disorder, single episode, unspecified: Secondary | ICD-10-CM

## 2011-08-07 DIAGNOSIS — T50902A Poisoning by unspecified drugs, medicaments and biological substances, intentional self-harm, initial encounter: Secondary | ICD-10-CM

## 2011-08-07 DIAGNOSIS — T50992A Poisoning by other drugs, medicaments and biological substances, intentional self-harm, initial encounter: Secondary | ICD-10-CM | POA: Diagnosis present

## 2011-08-07 DIAGNOSIS — F3289 Other specified depressive episodes: Secondary | ICD-10-CM | POA: Diagnosis present

## 2011-08-07 DIAGNOSIS — E669 Obesity, unspecified: Secondary | ICD-10-CM | POA: Diagnosis present

## 2011-08-07 DIAGNOSIS — E039 Hypothyroidism, unspecified: Secondary | ICD-10-CM | POA: Diagnosis present

## 2011-08-07 DIAGNOSIS — T43294A Poisoning by other antidepressants, undetermined, initial encounter: Principal | ICD-10-CM | POA: Diagnosis present

## 2011-08-07 DIAGNOSIS — E032 Hypothyroidism due to medicaments and other exogenous substances: Secondary | ICD-10-CM

## 2011-08-07 DIAGNOSIS — T43214A Poisoning by selective serotonin and norepinephrine reuptake inhibitors, undetermined, initial encounter: Secondary | ICD-10-CM

## 2011-08-07 HISTORY — DX: Poisoning by selective serotonin reuptake inhibitors, accidental (unintentional), initial encounter: T43.221A

## 2011-08-07 HISTORY — DX: Anxiety disorder, unspecified: F41.9

## 2011-08-07 HISTORY — DX: Depression, unspecified: F32.A

## 2011-08-07 LAB — CBC
HCT: 38.4 % (ref 36.0–46.0)
Hemoglobin: 12.5 g/dL (ref 12.0–15.0)
MCH: 27.8 pg (ref 26.0–34.0)
MCHC: 32.6 g/dL (ref 30.0–36.0)
MCV: 85.5 fL (ref 78.0–100.0)
Platelets: 327 10*3/uL (ref 150–400)
RBC: 4.49 MIL/uL (ref 3.87–5.11)
RDW: 12.7 % (ref 11.5–15.5)
WBC: 7 10*3/uL (ref 4.0–10.5)

## 2011-08-07 LAB — URINALYSIS, ROUTINE W REFLEX MICROSCOPIC
Bilirubin Urine: NEGATIVE
Glucose, UA: NEGATIVE mg/dL
Ketones, ur: NEGATIVE mg/dL
Leukocytes, UA: NEGATIVE
Nitrite: NEGATIVE
Protein, ur: NEGATIVE mg/dL
Specific Gravity, Urine: 1.007 (ref 1.005–1.030)
Urobilinogen, UA: 0.2 mg/dL (ref 0.0–1.0)
pH: 7.5 (ref 5.0–8.0)

## 2011-08-07 LAB — URINE MICROSCOPIC-ADD ON

## 2011-08-07 LAB — RAPID URINE DRUG SCREEN, HOSP PERFORMED
Amphetamines: NOT DETECTED
Barbiturates: NOT DETECTED
Benzodiazepines: NOT DETECTED
Cocaine: NOT DETECTED
Opiates: NOT DETECTED
Tetrahydrocannabinol: NOT DETECTED

## 2011-08-07 LAB — COMPREHENSIVE METABOLIC PANEL
ALT: 9 U/L (ref 0–35)
AST: 14 U/L (ref 0–37)
Albumin: 3.6 g/dL (ref 3.5–5.2)
Alkaline Phosphatase: 103 U/L (ref 39–117)
BUN: 5 mg/dL — ABNORMAL LOW (ref 6–23)
CO2: 19 mEq/L (ref 19–32)
Calcium: 8.6 mg/dL (ref 8.4–10.5)
Chloride: 106 mEq/L (ref 96–112)
Creatinine, Ser: 0.95 mg/dL (ref 0.50–1.10)
GFR calc Af Amer: 89 mL/min — ABNORMAL LOW (ref >90)
GFR calc non Af Amer: 77 mL/min — ABNORMAL LOW (ref >90)
Glucose, Bld: 98 mg/dL (ref 70–99)
Potassium: 3 mEq/L — ABNORMAL LOW (ref 3.5–5.1)
Sodium: 138 mEq/L (ref 135–145)
Total Bilirubin: 0.3 mg/dL (ref 0.3–1.2)
Total Protein: 7.5 g/dL (ref 6.0–8.3)

## 2011-08-07 LAB — TROPONIN I: Troponin I: <0.3 ng/mL (ref <0.30)

## 2011-08-07 LAB — SALICYLATE LEVEL: Salicylate Lvl: <2 mg/dL — ABNORMAL LOW (ref 2.8–20.0)

## 2011-08-07 LAB — ETHANOL: Alcohol, Ethyl (B): <11 mg/dL (ref 0–11)

## 2011-08-07 LAB — PREGNANCY, URINE: Preg Test, Ur: NEGATIVE

## 2011-08-07 LAB — ACETAMINOPHEN LEVEL: Acetaminophen (Tylenol), Serum: <15 ug/mL (ref 10–30)

## 2011-08-07 MED ORDER — LEVOTHYROXINE SODIUM 75 MCG PO TABS
75.0000 ug | ORAL_TABLET | Freq: Every day | ORAL | Status: DC
  Administered 2011-08-09: 75 ug via ORAL
  Filled 2011-08-07 (×3): qty 1

## 2011-08-07 MED ORDER — SODIUM CHLORIDE 0.9 % IJ SOLN
3.0000 mL | Freq: Two times a day (BID) | INTRAMUSCULAR | Status: DC
  Administered 2011-08-07 – 2011-08-09 (×2): 3 mL via INTRAVENOUS

## 2011-08-07 MED ORDER — ALBUTEROL SULFATE HFA 108 (90 BASE) MCG/ACT IN AERS: 2.0000 | INHALATION_SPRAY | Freq: Four times a day (QID) | RESPIRATORY_TRACT | Status: DC | PRN

## 2011-08-07 MED ORDER — POTASSIUM CHLORIDE CRYS ER 20 MEQ PO TBCR
40.0000 meq | EXTENDED_RELEASE_TABLET | Freq: Once | ORAL | Status: AC
  Administered 2011-08-07: 40 meq via ORAL
  Filled 2011-08-07: qty 2

## 2011-08-07 MED ORDER — ENOXAPARIN SODIUM 30 MG/0.3ML ~~LOC~~ SOLN: 30.0000 mg | SUBCUTANEOUS | Status: DC

## 2011-08-07 MED ORDER — IBUPROFEN 600 MG PO TABS
600.0000 mg | ORAL_TABLET | Freq: Four times a day (QID) | ORAL | Status: DC | PRN
  Filled 2011-08-07: qty 1

## 2011-08-07 MED ORDER — ONDANSETRON HCL 4 MG/2ML IJ SOLN: 4.0000 mg | Freq: Three times a day (TID) | INTRAMUSCULAR | Status: DC | PRN

## 2011-08-07 MED ORDER — LAMOTRIGINE 25 MG PO TABS
25.0000 mg | ORAL_TABLET | Freq: Every day | ORAL | Status: DC
  Administered 2011-08-07 – 2011-08-08 (×2): 25 mg via ORAL
  Filled 2011-08-07 (×3): qty 1

## 2011-08-07 MED ORDER — SODIUM CHLORIDE 0.9 % IV BOLUS (SEPSIS)
1000.0000 mL | Freq: Once | INTRAVENOUS | Status: AC
  Administered 2011-08-07: 1000 mL via INTRAVENOUS

## 2011-08-07 MED ORDER — LORAZEPAM 2 MG/ML IJ SOLN
2.0000 mg | Freq: Once | INTRAMUSCULAR | Status: AC
  Administered 2011-08-07: 2 mg via INTRAVENOUS
  Filled 2011-08-07: qty 1

## 2011-08-07 MED ORDER — LORAZEPAM 2 MG/ML IJ SOLN
2.0000 mg | Freq: Once | INTRAMUSCULAR | Status: AC
  Administered 2011-08-07: 2 mg via INTRAVENOUS

## 2011-08-07 MED ORDER — METOCLOPRAMIDE HCL 10 MG PO TABS
10.0000 mg | ORAL_TABLET | Freq: Four times a day (QID) | ORAL | Status: DC
  Administered 2011-08-07 – 2011-08-09 (×7): 10 mg via ORAL
  Filled 2011-08-07 (×11): qty 1

## 2011-08-07 MED ORDER — MORPHINE SULFATE 2 MG/ML IJ SOLN
2.0000 mg | INTRAMUSCULAR | Status: DC | PRN
  Administered 2011-08-07 – 2011-08-09 (×5): 2 mg via INTRAVENOUS
  Filled 2011-08-07 (×5): qty 1

## 2011-08-07 MED ORDER — ONDANSETRON HCL 4 MG PO TABS: 4.0000 mg | ORAL_TABLET | Freq: Four times a day (QID) | ORAL | Status: DC | PRN

## 2011-08-07 MED ORDER — ENOXAPARIN SODIUM 40 MG/0.4ML ~~LOC~~ SOLN
40.0000 mg | SUBCUTANEOUS | Status: DC
  Filled 2011-08-07 (×3): qty 0.4

## 2011-08-07 MED ORDER — ARIPIPRAZOLE 5 MG PO TABS
5.0000 mg | ORAL_TABLET | Freq: Every day | ORAL | Status: DC
  Administered 2011-08-07 – 2011-08-08 (×2): 5 mg via ORAL
  Filled 2011-08-07 (×3): qty 1

## 2011-08-07 MED ORDER — SODIUM CHLORIDE 0.9 % IV SOLN: INTRAVENOUS | Status: DC

## 2011-08-07 MED ORDER — SODIUM CHLORIDE 0.9 % IV SOLN
INTRAVENOUS | Status: DC
  Administered 2011-08-07 – 2011-08-08 (×2): via INTRAVENOUS
  Administered 2011-08-08: 100 mL/h via INTRAVENOUS
  Administered 2011-08-09: 1000 mL via INTRAVENOUS

## 2011-08-07 MED ORDER — LORAZEPAM 2 MG/ML IJ SOLN
INTRAMUSCULAR | Status: AC
  Filled 2011-08-07: qty 1

## 2011-08-07 MED ORDER — ONDANSETRON HCL 4 MG/2ML IJ SOLN
4.0000 mg | Freq: Four times a day (QID) | INTRAMUSCULAR | Status: DC | PRN
  Administered 2011-08-09: 4 mg via INTRAVENOUS
  Filled 2011-08-07: qty 2

## 2011-08-07 MED ORDER — LORAZEPAM 2 MG/ML IJ SOLN
1.0000 mg | Freq: Four times a day (QID) | INTRAMUSCULAR | Status: DC | PRN
  Administered 2011-08-08 (×2): 1 mg via INTRAVENOUS
  Filled 2011-08-07 (×2): qty 1

## 2011-08-07 MED ORDER — TRAZODONE HCL 100 MG PO TABS
100.0000 mg | ORAL_TABLET | Freq: Every day | ORAL | Status: DC
  Administered 2011-08-07 – 2011-08-08 (×2): 100 mg via ORAL
  Filled 2011-08-07 (×3): qty 1

## 2011-08-07 MED ORDER — LORAZEPAM 2 MG/ML IJ SOLN: 2.0000 mg | Freq: Once | INTRAMUSCULAR | Status: DC

## 2011-08-07 NOTE — ED Notes (Signed)
Pt states she took 6 wellbutrin and 30 25mg  benedryl.  Pt states she took meds 45 minutes ago.  Pt denies drinking ETOH.  Pt agitated, crying.

## 2011-08-07 NOTE — ED Notes (Addendum)
Pt states that she feels depressed.  Pt's states she is having relationship problems.  Pt states she is separated from her husband, and states her mother doesn't love her anymore and it is better if she would not be here.  Pt states she OD on pills " a couple months ago."  Pt states she lives with her mother.

## 2011-08-07 NOTE — ED Notes (Signed)
Stacey Weaver [phone] grandmother to pt Stacey Weaver [phone] aunt  Contacts for pt if needed

## 2011-08-07 NOTE — ED Provider Notes (Addendum)
History    36yF presenting after intentional drug overdose. Around 1300 pt took 6 wellbutrin and ~30 25mg  tabs of benadryl. Wellbutrin not prescribed to her and not sure of dosages. Apparently poor relationships with mother and husband. Denies any other ingestion. Pt prescribed multiple other meds but says taking these as prescribed. Pt agitated and difficult to obtain much additional history at this time.  Hx of depression and bipolar. Per grandmother, this is not first suicide attempt.  CSN: [id number]  Arrival date & time 08/07/11  1401   First MD Initiated Contact with Patient 08/07/11 1418      Chief Complaint  Patient presents with  . Drug Overdose  . Chest Pain    (Consider location/radiation/quality/duration/timing/severity/associated sxs/prior treatment) HPI  Past Medical History  Diagnosis Date  . Bipolar 1 disorder   . Asthma   . Migraine   . Anxiety     Past Surgical History  Procedure Date  . Knee surgery     History reviewed. No pertinent family history.  History  Substance Use Topics  . Smoking status: Never Smoker   . Smokeless tobacco: Not on file  . Alcohol Use: No    OB History    Grav Para Term Preterm Abortions TAB SAB Ect Mult Living                  Review of Systems  Level 5 caveat applies because of pt agitated and history unreliable.   Allergies  Almotriptan malate; Zomig; and Cymbalta  Home Medications   Current Outpatient Rx  Name Route Sig Dispense Refill  . ACETAMINOPHEN 500 MG PO TABS Oral Take 1,000 mg by mouth every 6 (six) hours as needed.      . ARIPIPRAZOLE 5 MG PO TABS Oral Take 5 mg by mouth at bedtime.      . ASPIRIN-ACETAMINOPHEN-CAFFEINE 250-250-65 MG PO TABS Oral Take 2 tablets by mouth every 6 (six) hours as needed.      [name] CLONAZEPAM 1 MG PO TABS Oral Take 1 mg by mouth daily as needed. For anxiety     . IBUPROFEN 200 MG PO TABS Oral Take 800 mg by mouth every 6 (six) hours as needed.      [name] LAMOTRIGINE 25 MG  PO TABS Oral Take 25 mg by mouth at bedtime.      . TRAZODONE HCL 100 MG PO TABS Oral Take 100 mg by mouth at bedtime.        BP 143/86  Pulse 137  Temp 99.7 F (37.6 C)  Resp 24  SpO2 97%  Physical Exam  Nursing note and vitals reviewed. Constitutional: She appears distressed.       Pt extremely anxious and tearful. Obese.  HENT:  Head: Normocephalic and atraumatic.  Eyes: Conjunctivae are normal. Pupils are equal, round, and reactive to light. Right eye exhibits no discharge. Left eye exhibits no discharge.  Neck: Neck supple.  Cardiovascular: Regular rhythm and normal heart sounds.  Exam reveals no gallop and no friction rub.   No murmur heard.      Tachycardic.   Pulmonary/Chest: Effort normal and breath sounds normal. No respiratory distress.  Abdominal: Soft. She exhibits no distension. There is no tenderness.  Musculoskeletal: She exhibits no edema and no tenderness.  Neurological: She is alert.       Not cooperating with most of exam but no focal deficits noted. Moving all extremities equally. No facial droop noted. Coordination appears to be normal.  Skin: Skin  is warm and dry. She is not diaphoretic.  Psychiatric:       Anxious. Crying. Wailing at times.     ED Course  Procedures (including critical care time)  CRITICAL CARE Performed by: Raeford Razor   Total critical care time: 35 minutes  Critical care time was exclusive of separately billable procedures and treating other patients.  Critical care was necessary to treat or prevent imminent or life-threatening deterioration.  Critical care was time spent personally by me on the following activities: development of treatment plan with patient and/or surrogate as well as nursing, discussions with consultants, evaluation of patient's response to treatment, examination of patient, obtaining history from patient or surrogate, ordering and performing treatments and interventions, ordering and review of laboratory  studies, ordering and review of radiographic studies, pulse oximetry and re-evaluation of patient's condition.  Labs Reviewed  SALICYLATE LEVEL - Abnormal; Notable for the following:    Salicylate Lvl <2.0 (*)    All other components within normal limits  COMPREHENSIVE METABOLIC PANEL - Abnormal; Notable for the following:    Potassium 3.0 (*)    BUN 5 (*)    GFR calc non Af Amer 77 (*)    GFR calc Af Amer 89 (*)    All other components within normal limits  CBC  ACETAMINOPHEN LEVEL  TROPONIN I  ETHANOL  PREGNANCY, URINE  URINALYSIS, ROUTINE W REFLEX MICROSCOPIC  URINE RAPID DRUG SCREEN (HOSP PERFORMED)   No results found.  EKG:  Rhythm: sinus tach Rate: 121 Axis: normal Intervals: normal ST segments: NS ST changes.  1. Intentional drug overdose   2. Depression       MDM  36yF with overdose of wellbutrin and benadryl intentionally with intent for self harm. Remains tachy which suspect related to anxiety and vagolytic effect of diphenhydramine.  Initial EKG sinus tach with normal intervals. Will repeat given ingestion shortly prior to arrival. Will continue to monitor. Benzos for sedation. Unknown what dose of wellbutrin or if extended preparation. Poison control recommending admit for observation. Labs thus far fairly unremarkable aside from hypoK. Will replete orally when pt calmer. Will continue to monitor in ED and admit per poison control recommendations. IVC paperwork initiated.        Raeford Razor, MD 08/07/11 1612  Raeford Razor, MD 08/09/11 1048

## 2011-08-07 NOTE — H&P (Signed)
PCP:  Patient does not have a primary care provider, but she is followed by psychiatrist. She is unsure of his name.  Chief Complaint:  Overdose  HPI: Patient is a 36 year old Afro-American female past history of hypothyroidism and obesity who presents to the emergency room after taking an overdose. She states things have been very stressful with her husband and their, separated as well as with her mom so she took an unknown quantity of Wellbutrin and Benadryl today she immediately called her friend who came and called the ambulance and the patient was brought into the emergency room. Patient has reportedly had a previous history of suicide attempts.  In the emergency room she was evaluated. She is noted to have potassium of 3.0, but the rest of her labs are otherwise unremarkable. An EKG was unremarkable. Emergency room physician spoke with poison control recommended telemetry monitoring for arrhythmia but otherwise observation without any invasive treatment. Hospitals were called for evaluation and admission.  Review of Systems:  I saw the patient after she had been transferred to the floor. She was quite tearful and upset and feeling very anxious. She complained of hurting all over and feeling very anxious and on edge. She said that she was not able to sleep. She does want to take something to make her comfortable. She admitted to taking these medications in saying that she wanted to stress to stop. She would not amounts a chair she wanted to kill herself. She claims of a headache, but denies any vision changes or dysphagia. She complains of pain in her chest but all cross her entire chest abdomen legs and arms. She complains of feeling short of breath but denies any wheezing or coughing. She complains of abdominal pain along with pain everywhere else she denies any hematuria or dysuria or constipation or diarrhea. Review of systems otherwise negative. She does some nauseous without any  vomiting.  Past Medical History: Past Medical History  Diagnosis Date  . Bipolar 1 disorder   . Asthma   . Migraine   . Anxiety    hypothyroidism Obesity Previous attempts at suicide  Past Surgical History  Procedure Date  . Knee surgery     Medications: Prior to Admission medications   Medication Sig Start Date End Date Taking? Authorizing Provider  acetaminophen (TYLENOL) 500 MG tablet Take 1,000 mg by mouth every 6 (six) hours as needed. For pain relief   Yes Historical Provider, MD  albuterol (PROVENTIL HFA;VENTOLIN HFA) 108 (90 BASE) MCG/ACT inhaler Inhale 2 puffs into the lungs every 6 (six) hours as needed. For shortness of breath   Yes Historical Provider, MD  ARIPiprazole (ABILIFY) 5 MG tablet Take 5 mg by mouth at bedtime.     Yes Historical Provider, MD  aspirin-acetaminophen-caffeine (EXCEDRIN MIGRAINE) [phone] MG per tablet Take 2 tablets by mouth every 6 (six) hours as needed. For pain   Yes Historical Provider, MD  buPROPion (WELLBUTRIN XL) 300 MG 24 hr tablet Take 300 mg by mouth daily.   Yes Historical Provider, MD  calcium carbonate (TUMS - DOSED IN MG ELEMENTAL CALCIUM) 500 MG chewable tablet Chew 1 tablet by mouth daily.   Yes Historical Provider, MD  clonazePAM (KLONOPIN) 1 MG tablet Take 1 mg by mouth daily as needed. For anxiety    Yes Historical Provider, MD  FLUoxetine (PROZAC) 20 MG tablet Take 20 mg by mouth daily.   Yes Historical Provider, MD  Ginkgo Biloba 40 MG TABS Take 60 mg by mouth daily.  Yes Historical Provider, MD  ibuprofen (ADVIL,MOTRIN) 200 MG tablet Take 800 mg by mouth every 6 (six) hours as needed. For pain relief   Yes Historical Provider, MD  lamoTRIgine (LAMICTAL) 25 MG tablet Take 25 mg by mouth at bedtime.     Yes Historical Provider, MD  levothyroxine (SYNTHROID, LEVOTHROID) 75 MCG tablet Take 75 mcg by mouth daily.   Yes Historical Provider, MD  meclizine (ANTIVERT) 25 MG tablet Take 25 mg by mouth 3 (three) times daily as  needed. For dizzines   Yes Historical Provider, MD  metoCLOPramide (REGLAN) 10 MG tablet Take 10 mg by mouth 4 (four) times daily.   Yes Historical Provider, MD  traZODone (DESYREL) 100 MG tablet Take 100 mg by mouth at bedtime.     Yes Historical Provider, MD    Allergies:   Allergies  Allergen Reactions  . Almotriptan Malate Shortness Of Breath  . Zomig Shortness Of Breath  . Cymbalta (Duloxetine Hcl)     suicidal thoughts    Social History:  reports that she has never smoked. She does not have any smokeless tobacco history on file. She reports that she does not drink alcohol or use illicit drugs. The patient is normally at baseline able to participate in most activities of daily living. She is separated from her husband..  Family History: Hypertension and diabetes.  Physical Exam: Filed Vitals:   08/07/11 1645 08/07/11 1745 08/07/11 1755 08/07/11 1825  BP:   125/94 130/80  Pulse: 97  84 72  Temp:   98.2 F (36.8 C) 99 F (37.2 C)  TempSrc:   Oral Oral  Resp: 23 20 21 22   Height:    5\' 5"  (1.651 m)  Weight:    86.183 kg (190 lb)  SpO2: 100%  100% 100%   General: Alert and oriented x3, moderate distress secondary to anxiety more than anything ounces, looks older than stated age, fatigued HEENT: Normocephalic, atraumatic, mucous her meds are slightly dry Cardiovascular: Mild tachycardia, regular rhythm Lungs: Clear to auscultation bilaterally Abdomen: Soft, nontender, and obese, hypoactive bowel sounds Extremities: No clubbing or cyanosis, trace pitting edema   Labs on Admission:   Basename 08/07/11 1440  NA 138  K 3.0*  CL 106  CO2 19  GLUCOSE 98  BUN 5*  CREATININE 0.95  CALCIUM 8.6  MG --  PHOS --    Basename 08/07/11 1440  AST 14  ALT 9  ALKPHOS 103  BILITOT 0.3  PROT 7.5  ALBUMIN 3.6    Basename 08/07/11 1440  WBC 7.0  NEUTROABS --  HGB 12.5  HCT 38.4  MCV 85.5  PLT 327    Basename 08/07/11 1440  CKTOTAL --  CKMB --  CKMBINDEX --   TROPONINI <0.30   EKG: Sinus tachycardia with a normal variant of R'SR.  Radiological Exams on Admission: No results found.  Assessment/Plan Present on Admission:  .SSRI and Benadryl overdose: Observe overnight with telemetry. Repeat EKGs in the morning. Have notified psychiatry and they will see the patient in the morning. She is involuntarily committed.  .Depression: Holding her Wellbutrin.  .Anxiety disorder: When necessary IV Ativan  .Obesity  Hypothyroidism: Continue Synthroid. Check TSH  Asthma: Stable. When necessary albuterol inhaler  After discussion with the patient, she  is to be a full code.  We will respect these wishes.  I anticipate her  length of stay to be one to 2 days depending on behavioral health availability and decision by psychiatry , unless something should  change.  Time spent on this patient including examination and decision-making process: 45  minutes.  Hollice Espy [phone] 08/07/2011, 7:57 PM

## 2011-08-07 NOTE — ED Notes (Signed)
Pt ambulated to restroom with the assistance of tech (me) and off duty [employer]. Pt changed into blue scrubs. Pt was lowered to the floor and then placed in the wheelchair with the help of tech (me) and off duty [employer]. Patient did not fall. Pt states " you saw my legs buckle". RN Fleet Contras notified. Patient assisted back to the bed and sitter now at bedside.

## 2011-08-07 NOTE — ED Notes (Signed)
[id number]<AV> Expected date:<BR> Expected time:<BR> Means of arrival:<BR> Comments:<BR> Hold for room 8

## 2011-08-07 NOTE — ED Notes (Signed)
Poison control called.  Recommended pt put in for 24 hour observation.  Wellbutrin doses over 600mg  can cause seizures, so put on seizure precautions.  Doses above 400mg  may cause agitation and tremors.  Since it is unknown whether she took extended release or not seizures could occur up to 24 hours later.  Recommended that we check EKG for QRS widening (done) and acetaminophen level (done).  Recommended use of benzodiazepines for anxiety and seizures if they occur, with phenobarbitol as second line for seizures if they occur.  High doses of benedryl may cause hallucinations, and urinary retention.  Beth from poison control

## 2011-08-07 NOTE — ED Notes (Signed)
Per EMs pt texted or phoned grandmother to tell of intention to kill self.  Family found pt 15 minutes later, distraught.  Pt agitated, alert and oriented.  Time of OD 1300.

## 2011-08-07 NOTE — ED Notes (Signed)
ZOX:WRUE<AV> Expected date:<BR> Expected time: 1:57 PM<BR> Means of arrival:Ambulance<BR> Comments:<BR> M31 -- Overdose

## 2011-08-07 NOTE — ED Notes (Signed)
Pt also c/o chest pain.

## 2011-08-07 NOTE — ED Notes (Signed)
IVC Petition completed and faxed to [employer].

## 2011-08-07 NOTE — ED Provider Notes (Signed)
Was asked to check repeat EKG done at change of shift. Patient had overdosed and repeat EKG done to make sure she had not increased her QRS interval.   Date: 08/07/2011  Rate: 101  Rhythm: sinus tachycardia  QRS Axis: normal  Intervals: normal  ST/T Wave abnormalities: nonspecific T wave changes  Conduction Disutrbances:none NSCS EKG prior today, QRSD was 74, now 86, but still normal  Devoria Albe, MD, Armando Gang      Ward Givens, MD 08/07/11 1645

## 2011-08-08 DIAGNOSIS — M6282 Rhabdomyolysis: Secondary | ICD-10-CM | POA: Diagnosis present

## 2011-08-08 DIAGNOSIS — T450X4A Poisoning by antiallergic and antiemetic drugs, undetermined, initial encounter: Secondary | ICD-10-CM

## 2011-08-08 DIAGNOSIS — T450X1A Poisoning by antiallergic and antiemetic drugs, accidental (unintentional), initial encounter: Secondary | ICD-10-CM | POA: Diagnosis present

## 2011-08-08 DIAGNOSIS — F319 Bipolar disorder, unspecified: Secondary | ICD-10-CM

## 2011-08-08 DIAGNOSIS — F329 Major depressive disorder, single episode, unspecified: Secondary | ICD-10-CM

## 2011-08-08 DIAGNOSIS — E032 Hypothyroidism due to medicaments and other exogenous substances: Secondary | ICD-10-CM

## 2011-08-08 DIAGNOSIS — T43214A Poisoning by selective serotonin and norepinephrine reuptake inhibitors, undetermined, initial encounter: Secondary | ICD-10-CM

## 2011-08-08 HISTORY — DX: Poisoning by antiallergic and antiemetic drugs, accidental (unintentional), initial encounter: T45.0X1A

## 2011-08-08 LAB — BASIC METABOLIC PANEL
BUN: 5 mg/dL — ABNORMAL LOW (ref 6–23)
Chloride: 110 mEq/L (ref 96–112)
Creatinine, Ser: 1.07 mg/dL (ref 0.50–1.10)
GFR calc Af Amer: 77 mL/min — ABNORMAL LOW (ref >90)
GFR calc non Af Amer: 66 mL/min — ABNORMAL LOW (ref >90)
Glucose, Bld: 81 mg/dL (ref 70–99)

## 2011-08-08 LAB — CBC
HCT: 38.6 % (ref 36.0–46.0)
Hemoglobin: 12.1 g/dL (ref 12.0–15.0)
MCH: 27.3 pg (ref 26.0–34.0)
MCHC: 31.3 g/dL (ref 30.0–36.0)
RDW: 12.9 % (ref 11.5–15.5)

## 2011-08-08 LAB — CARDIAC PANEL(CRET KIN+CKTOT+MB+TROPI)
Relative Index: 0.2 (ref 0.0–2.5)
Total CK: 971 U/L — ABNORMAL HIGH (ref 7–177)

## 2011-08-08 NOTE — Progress Notes (Signed)
Patient refusing EKG this morning. Irritated last night about not being able to sleep and having sitter. Process was explained to the patient.

## 2011-08-08 NOTE — Consult Note (Addendum)
Reason for Consult: OD Referring Physician: [name] is an 36 y.o. female.  HPI: Patient is a 36 year old Afro-American female past history of hypothyroidism and obesity who presents who admitted after taking an overdose. She states things have been very stressful with her husband and their, separated as well as with her mom so she took an unknown quantity of Wellbutrin and Benadryl today she immediately called her friend who came and called the ambulance and the patient was brought into the emergency room. Patient has reportedly had a previous history of suicide attempts.  In the emergency room she was evaluated. She is noted to have potassium of 3.0, but the rest of her labs are otherwise unremarkable. An EKG was unremarkable. Emergency room physician spoke with poison control recommended telemetry monitoring for arrhythmia but otherwise observation without any invasive treatment.   See today. Reports conflict with her mother that gotten worse as per pt she is very controlling. Denies this as SI attempts and reports that it was to just sleep.  Denies being suidical now. Reports hx of bipolar and thinks meds are working well. Not interested for psy inpt at this time. Thinks her depression under control now and she is just stressed out because of her mother. Reports regualr follow up with her psy out pt clinic. pt is not on wellburin and she intentionally chose it for unknown reason and not other meds.  Feels tired now.  Past Medical History  Diagnosis Date  . Bipolar 1 disorder   . Asthma   . Migraine   . Anxiety     Past Surgical History  Procedure Date  . Knee surgery     History reviewed. No pertinent family history.  Social History:  reports that she has never smoked. She does not have any smokeless tobacco history on file. She reports that she does not drink alcohol or use illicit drugs. work as rn at [employer]  Allergies:  Allergies  Allergen Reactions  .  Almotriptan Malate Shortness Of Breath  . Zomig Shortness Of Breath  . Cymbalta (Duloxetine Hcl)     suicidal thoughts    Medications: I have reviewed the patient's current medications.  Results for orders placed during the hospital encounter of 08/07/11 (from the past 48 hour(s))  CBC     Status: Normal   Collection Time   08/07/11  2:40 PM      Component Value Range Comment   WBC 7.0  4.0 - 10.5 (K/uL)    RBC 4.49  3.87 - 5.11 (MIL/uL)    Hemoglobin 12.5  12.0 - 15.0 (g/dL)    HCT 21.3  08.6 - 57.8 (%)    MCV 85.5  78.0 - 100.0 (fL)    MCH 27.8  26.0 - 34.0 (pg)    MCHC 32.6  30.0 - 36.0 (g/dL)    RDW 46.9  62.9 - 52.8 (%)    Platelets 327  150 - 400 (K/uL)   ACETAMINOPHEN LEVEL     Status: Normal   Collection Time   08/07/11  2:40 PM      Component Value Range Comment   Acetaminophen (Tylenol), Serum <15.0  10 - 30 (ug/mL)   SALICYLATE LEVEL     Status: Abnormal   Collection Time   08/07/11  2:40 PM      Component Value Range Comment   Salicylate Lvl <2.0 (*) 2.8 - 20.0 (mg/dL)   TROPONIN I     Status: Normal  Collection Time   08/07/11  2:40 PM      Component Value Range Comment   Troponin I <0.30  <0.30 (ng/mL)   COMPREHENSIVE METABOLIC PANEL     Status: Abnormal   Collection Time   08/07/11  2:40 PM      Component Value Range Comment   Sodium 138  135 - 145 (mEq/L)    Potassium 3.0 (*) 3.5 - 5.1 (mEq/L)    Chloride 106  96 - 112 (mEq/L)    CO2 19  19 - 32 (mEq/L)    Glucose, Bld 98  70 - 99 (mg/dL)    BUN 5 (*) 6 - 23 (mg/dL)    Creatinine, Ser 1.30  0.50 - 1.10 (mg/dL)    Calcium 8.6  8.4 - 10.5 (mg/dL)    Total Protein 7.5  6.0 - 8.3 (g/dL)    Albumin 3.6  3.5 - 5.2 (g/dL)    AST 14  0 - 37 (U/L)    ALT 9  0 - 35 (U/L)    Alkaline Phosphatase 103  39 - 117 (U/L)    Total Bilirubin 0.3  0.3 - 1.2 (mg/dL)    GFR calc non Af Amer 77 (*) >90 (mL/min)    GFR calc Af Amer 89 (*) >90 (mL/min)   ETHANOL     Status: Normal   Collection Time   08/07/11  2:40 PM       Component Value Range Comment   Alcohol, Ethyl (B) <11  0 - 11 (mg/dL)   TSH     Status: Normal   Collection Time   08/07/11  2:40 PM      Component Value Range Comment   TSH 1.950  0.350 - 4.500 (uIU/mL)   PREGNANCY, URINE     Status: Normal   Collection Time   08/07/11  4:35 PM      Component Value Range Comment   Preg Test, Ur NEGATIVE  NEGATIVE    URINALYSIS, ROUTINE W REFLEX MICROSCOPIC     Status: Abnormal   Collection Time   08/07/11  4:35 PM      Component Value Range Comment   Color, Urine YELLOW  YELLOW     APPearance CLEAR  CLEAR     Specific Gravity, Urine 1.007  1.005 - 1.030     pH 7.5  5.0 - 8.0     Glucose, UA NEGATIVE  NEGATIVE (mg/dL)    Hgb urine dipstick TRACE (*) NEGATIVE     Bilirubin Urine NEGATIVE  NEGATIVE     Ketones, ur NEGATIVE  NEGATIVE (mg/dL)    Protein, ur NEGATIVE  NEGATIVE (mg/dL)    Urobilinogen, UA 0.2  0.0 - 1.0 (mg/dL)    Nitrite NEGATIVE  NEGATIVE     Leukocytes, UA NEGATIVE  NEGATIVE    URINE RAPID DRUG SCREEN (HOSP PERFORMED)     Status: Normal   Collection Time   08/07/11  4:35 PM      Component Value Range Comment   Opiates NONE DETECTED  NONE DETECTED     Cocaine NONE DETECTED  NONE DETECTED     Benzodiazepines NONE DETECTED  NONE DETECTED     Amphetamines NONE DETECTED  NONE DETECTED     Tetrahydrocannabinol NONE DETECTED  NONE DETECTED     Barbiturates NONE DETECTED  NONE DETECTED    URINE MICROSCOPIC-ADD ON     Status: Abnormal   Collection Time   08/07/11  4:35 PM      Component  Value Range Comment   Squamous Epithelial / LPF RARE  RARE     WBC, UA 0-2  <3 (WBC/hpf)    RBC / HPF 0-2  <3 (RBC/hpf)    Bacteria, UA MANY (*) RARE    BASIC METABOLIC PANEL     Status: Abnormal   Collection Time   08/08/11  5:40 AM      Component Value Range Comment   Sodium 141  135 - 145 (mEq/L)    Potassium 4.1  3.5 - 5.1 (mEq/L)    Chloride 110  96 - 112 (mEq/L)    CO2 23  19 - 32 (mEq/L)    Glucose, Bld 81  70 - 99 (mg/dL)    BUN 5 (*) 6  - 23 (mg/dL)    Creatinine, Ser 1.61  0.50 - 1.10 (mg/dL)    Calcium 8.6  8.4 - 10.5 (mg/dL)    GFR calc non Af Amer 66 (*) >90 (mL/min)    GFR calc Af Amer 77 (*) >90 (mL/min)   CBC     Status: Normal   Collection Time   08/08/11  5:40 AM      Component Value Range Comment   WBC 6.6  4.0 - 10.5 (K/uL)    RBC 4.44  3.87 - 5.11 (MIL/uL)    Hemoglobin 12.1  12.0 - 15.0 (g/dL)    HCT 09.6  04.5 - 40.9 (%)    MCV 86.9  78.0 - 100.0 (fL)    MCH 27.3  26.0 - 34.0 (pg)    MCHC 31.3  30.0 - 36.0 (g/dL)    RDW 81.1  91.4 - 78.2 (%)    Platelets 310  150 - 400 (K/uL)   CARDIAC PANEL(CRET KIN+CKTOT+MB+TROPI)     Status: Abnormal   Collection Time   08/08/11  9:20 AM      Component Value Range Comment   Total CK 971 (*) 7 - 177 (U/L)    CK, MB 1.9  0.3 - 4.0 (ng/mL)    Troponin I <0.30  <0.30 (ng/mL)    Relative Index 0.2  0.0 - 2.5      No results found.  ROS Blood pressure 93/66, pulse 85, temperature 98.2 F (36.8 C), temperature source Oral, resp. rate 16, height 5\' 5"  (1.651 m), weight 86.183 kg (190 lb), SpO2 100.00%. Physical Exam   Alert, on bed  Mental Status Examination/Evaluation:  Objective: Appearance: Fairly Groomed Psychomotor Activity: Normal Eye Contact:: Good Speech: Normal Rate Volume: Normal Mood: ok Affect: ristricted Thought Process: Clear rational goal orieneted - get meds Orientation: Full Thought Content: denies AVH/Psychosis Suicidal Thoughts: No Homicidal Thoughts: No Judgement: Impaired Insight: Shallow   DIAGNOSIS:  AXIS I hx of bipolar d/o AXIS II  def AXIS III  See medical history. AXIS IV  unknown AXIS V 30   Treatment Plan Summary:  1. Continue 1;1 for safety  2. Will recommend psy in pt for safety. Concerned about this attemtps at [facility] attempt at this time  3. If pt stays here for few days will recommend psy re-evaluation.   4. tsh and t4 for thyroid  funtion  5. Will sign off. plz call again if needed  Stacey Weaver 08/08/2011, 10:13 PM

## 2011-08-08 NOTE — Progress Notes (Signed)
Subjective: Patient feeling a little bit better today. Less agitated. Still complaining of hurting all over, but overall she feels that this is much improved from yesterday. No shortness of breath or focal chest pain.  Objective: Weight change:   Intake/Output Summary (Last 24 hours) at 08/08/11 1531 Last data filed at 08/08/11 1020  Gross per 24 hour  Intake    480 ml  Output      0 ml  Net    480 ml    Filed Vitals:   08/08/11 1418  BP: 112/73  Pulse: 74  Temp: 98.6 F (37 C)  Resp: 18   General: Alert and oriented x3, mild distress secondary to pain but overall improved HEENT: Normocephalic and atraumatic mucous membranes are slightly dry Lungs: Clear to auscultation bilaterally Cardiovascular: Regular rate and rhythm, S1-S2 Abdomen: Soft, and nontender, obese, positive bowel sounds Extremities: No clubbing or cyanosis or edema  Lab Results: Basic Metabolic Panel:  Basename 08/08/11 0540 08/07/11 1440  NA 141 138  K 4.1 3.0*  CL 110 106  CO2 23 19  GLUCOSE 81 98  BUN 5* 5*  CREATININE 1.07 0.95  CALCIUM 8.6 8.6  MG -- --  PHOS -- --   Liver Function Tests:  Basename 08/07/11 1440  AST 14  ALT 9  ALKPHOS 103  BILITOT 0.3  PROT 7.5  ALBUMIN 3.6   CBC:  Basename 08/08/11 0540 08/07/11 1440  WBC 6.6 7.0  NEUTROABS -- --  HGB 12.1 12.5  HCT 38.6 38.4  MCV 86.9 85.5  PLT 310 327   Cardiac Enzymes:  Basename 08/08/11 0920 08/07/11 1440  CKTOTAL 971* --  CKMB 1.9 --  CKMBINDEX -- --  TROPONINI <0.30 <0.30   Thyroid Function Tests:  Basename 08/07/11 1440  TSH 1.950  T4TOTAL --  FREET4 --  T3FREE --  THYROIDAB --   Studies/Results: No results found.  Medications: Scheduled Meds:   . ARIPiprazole  5 mg Oral QHS  . enoxaparin (LOVENOX) injection  40 mg Subcutaneous Q24H  . lamoTRIgine  25 mg Oral QHS  . levothyroxine  75 mcg Oral QAC breakfast  . metoCLOPramide  10 mg Oral QID  . potassium chloride  40 mEq Oral Once  . sodium  chloride  3 mL Intravenous Q12H  . traZODone  100 mg Oral QHS  . DISCONTD: enoxaparin  30 mg Subcutaneous Q24H  . DISCONTD: LORazepam  2 mg Intravenous Once   Continuous Infusions:   . sodium chloride 100 mL/hr at 08/08/11 0815  . DISCONTD: sodium chloride     PRN Meds:.albuterol, ibuprofen, LORazepam, morphine injection, ondansetron (ZOFRAN) IV, ondansetron, DISCONTD: ondansetron (ZOFRAN) IV  Assessment/Plan: Patient Active Hospital Problem List: SSRI overdose (08/07/2011)  improving. EKG today noted a mildly elevated QT interval and questionable inverted T waves in anterior and inferior leads although some of this was appreciated on previous EKG yesterday. Plan to continue monitoring on telemetry and repeat EKG in the morning. Expect her to be medically cleared tomorrow.  Depression (08/07/2011)  a bit more calm. Awaiting psychiatry evaluation.  Anxiety disorder (08/07/2011)  responding well to when necessary Ativan.  Obesity (08/07/2011)  stable.  Hypothyroidism (08/07/2011)  TSH normal.  Rhabdomyolysis (08/08/2011)  likely side effect from overdose of SSRI plus Benadryl. This would account for her generalized pain. Overall improving.  Antihistamines overdose (08/08/2011)  see above. Continue monitoring.   LOS: 1 day   Hollice Espy 08/08/2011, 3:31 PM

## 2011-08-08 NOTE — Progress Notes (Signed)
EKG done

## 2011-08-09 DIAGNOSIS — F329 Major depressive disorder, single episode, unspecified: Secondary | ICD-10-CM

## 2011-08-09 DIAGNOSIS — E032 Hypothyroidism due to medicaments and other exogenous substances: Secondary | ICD-10-CM

## 2011-08-09 DIAGNOSIS — F314 Bipolar disorder, current episode depressed, severe, without psychotic features: Secondary | ICD-10-CM

## 2011-08-09 DIAGNOSIS — I517 Cardiomegaly: Secondary | ICD-10-CM

## 2011-08-09 DIAGNOSIS — T450X4A Poisoning by antiallergic and antiemetic drugs, undetermined, initial encounter: Secondary | ICD-10-CM

## 2011-08-09 DIAGNOSIS — T43214A Poisoning by selective serotonin and norepinephrine reuptake inhibitors, undetermined, initial encounter: Secondary | ICD-10-CM

## 2011-08-09 LAB — CARDIAC PANEL(CRET KIN+CKTOT+MB+TROPI)
CK, MB: 1.3 ng/mL (ref 0.3–4.0)
Relative Index: 0.2 (ref 0.0–2.5)
Total CK: 551 U/L — ABNORMAL HIGH (ref 7–177)
Troponin I: <0.3 ng/mL (ref <0.30)

## 2011-08-09 LAB — BASIC METABOLIC PANEL
CO2: 21 mEq/L (ref 19–32)
Calcium: 8.6 mg/dL (ref 8.4–10.5)
Chloride: 105 mEq/L (ref 96–112)
Glucose, Bld: 113 mg/dL — ABNORMAL HIGH (ref 70–99)
Sodium: 136 mEq/L (ref 135–145)

## 2011-08-09 LAB — URINALYSIS, ROUTINE W REFLEX MICROSCOPIC
Ketones, ur: NEGATIVE mg/dL
Leukocytes, UA: NEGATIVE
Nitrite: NEGATIVE
Protein, ur: NEGATIVE mg/dL
Urobilinogen, UA: 0.2 mg/dL (ref 0.0–1.0)

## 2011-08-09 NOTE — Progress Notes (Signed)
  Echocardiogram 2D Echocardiogram has been performed.  Jorje Guild [facility] 08/09/2011, 2:10 PM

## 2011-08-09 NOTE — Progress Notes (Signed)
   CARE MANAGEMENT NOTE 08/09/2011  Patient:  Stacey Weaver, Stacey Weaver   Account Number:  [id number]  Date Initiated:  08/09/2011  Documentation initiated by:  Raiford Noble  Subjective/Objective Assessment:   pt adm s/p OD     Action/Plan:   lives with mother- recently separated   Anticipated DC Date:  08/11/2011   Anticipated DC Plan:  PSYCHIATRIC HOSPITAL  In-house referral  Clinical Social Worker      DC Planning Services  CM consult      [facility] Choice  NA   Choice offered to / List presented to:  NA   DME arranged  NA      DME agency  NA     HH arranged  NA      HH agency  NA   Status of service:  In process, will continue to follow Medicare Important Message given?  NA - LOS <3 / Initial given by admissions (If response is "NO", the following Medicare IM given date fields will be blank) Date Medicare IM given:   Date Additional Medicare IM given:    Discharge Disposition:    Per UR Regulation:  Reviewed for med. necessity/level of care/duration of stay  If discussed at Long Length of Stay Meetings, dates discussed:    Comments:  08-09-11 Raiford Noble, RN,BSN,CM [phone] Appears per Psych MD notes that patient may need inpt pscyh. Cont with sitter at bedside. Pt has no PCP. Will provide [employer] for indigent patients. Will cont to follow.

## 2011-08-09 NOTE — Progress Notes (Signed)
Met with Pt re: d/c plans.  Provided Pt with IOP information.  Pt became upset and stated that she's angry because she can't find anyone to pick her up.  She stated that her family doesn't feel that she's ready for d/c and that they want her to go to [facility].  CSW and Pt discussed her family's concern.  Pt reported that she lives with her mom and that her mom is upset with her for recent events.  Pt stated that she respects her siblings' opinions but, in the end, prefers to receive IOP services.  Notified MD.  Pt to be d/c'd.  Providence Crosby, LCSWA Clinical Social Work [phone]

## 2011-08-09 NOTE — Consult Note (Signed)
Patient Identification:  Stacey Weaver Date of Evaluation:  08/09/2011  Reason for Consult:  Overdose, suicidal ideation  Referring Provider: Dr. Rito Ehrlich History of Present Illness:Pt reports extreme distress over relationships with mother and with husband who has evaporated.   She decided she simply could not take the stress anymore and took and overdose of benadryl and Wellbutrin.  She called a friend for help  Past Psychiatric History:prior suicide attempts   Past Medical History:     Past Medical History  Diagnosis Date  . Bipolar 1 disorder   . Asthma   . Migraine   . Anxiety        Past Surgical History  Procedure Date  . Knee surgery     Allergies:  Allergies  Allergen Reactions  . Almotriptan Malate Shortness Of Breath  . Zomig Shortness Of Breath  . Cymbalta (Duloxetine Hcl)     suicidal thoughts    Current Medications:  Prior to Admission medications   Medication Sig Start Date End Date Taking? Authorizing Provider  acetaminophen (TYLENOL) 500 MG tablet Take 1,000 mg by mouth every 6 (six) hours as needed. For pain relief   Yes Historical Provider, MD  albuterol (PROVENTIL HFA;VENTOLIN HFA) 108 (90 BASE) MCG/ACT inhaler Inhale 2 puffs into the lungs every 6 (six) hours as needed. For shortness of breath   Yes Historical Provider, MD  ARIPiprazole (ABILIFY) 5 MG tablet Take 5 mg by mouth at bedtime.     Yes Historical Provider, MD  aspirin-acetaminophen-caffeine (EXCEDRIN MIGRAINE) [phone] MG per tablet Take 2 tablets by mouth every 6 (six) hours as needed. For pain   Yes Historical Provider, MD  buPROPion (WELLBUTRIN XL) 300 MG 24 hr tablet Take 300 mg by mouth daily.   Yes Historical Provider, MD  calcium carbonate (TUMS - DOSED IN MG ELEMENTAL CALCIUM) 500 MG chewable tablet Chew 1 tablet by mouth daily.   Yes Historical Provider, MD  clonazePAM (KLONOPIN) 1 MG tablet Take 1 mg by mouth daily as needed. For anxiety    Yes Historical Provider, MD    FLUoxetine (PROZAC) 20 MG tablet Take 20 mg by mouth daily.   Yes Historical Provider, MD  Ginkgo Biloba 40 MG TABS Take 60 mg by mouth daily.   Yes Historical Provider, MD  ibuprofen (ADVIL,MOTRIN) 200 MG tablet Take 800 mg by mouth every 6 (six) hours as needed. For pain relief   Yes Historical Provider, MD  lamoTRIgine (LAMICTAL) 25 MG tablet Take 25 mg by mouth at bedtime.     Yes Historical Provider, MD  levothyroxine (SYNTHROID, LEVOTHROID) 75 MCG tablet Take 75 mcg by mouth daily.   Yes Historical Provider, MD  meclizine (ANTIVERT) 25 MG tablet Take 25 mg by mouth 3 (three) times daily as needed. For dizzines   Yes Historical Provider, MD  metoCLOPramide (REGLAN) 10 MG tablet Take 10 mg by mouth 4 (four) times daily.   Yes Historical Provider, MD  traZODone (DESYREL) 100 MG tablet Take 100 mg by mouth at bedtime.     Yes Historical Provider, MD    Social History:    reports that she has never smoked. She does not have any smokeless tobacco history on file. She reports that she does not drink alcohol or use illicit drugs.   Family History:    History reviewed. No pertinent family history.  Mental Status Examination/Evaluation: Objective:  Appearance: Casual  Psychomotor Activity:  Decreased  Eye Contact::  Fair  Speech:  Clear and Coherent  Volume:  Normal  Mood:  Angry and Depressed  Affect:  Blunt  Thought Process:  Relevant  Orientation:  Full  Thought Content:  Suicidal ideation now resolved  Suicidal Thoughts:  Yes.  without intent/plan  Wants a psychiatrist  Homicidal Thoughts:  No  Judgement:  Other:  extreme distress  Insight:  Lacking    DIAGNOSIS:   AXIS I   Bipolar Disorder, depression most recent, severe  AXIS II  Deffered  AXIS III See medical notes.  AXIS IV housing problems, other psychosocial or environmental problems, problems related to social environment and problems with primary support group  AXIS V 41-50 serious symptoms     Assessment/Plan:  Chart reviewed  Discussed with Dr. Rito Ehrlich , Psych CSW Pt  Has been working as Charity fundraiser at [employer].  She has multiple stresses and says she is off work for the summer, to return August 2013.  She agrees to consider IOP.  RECOMMENDATION:  1. Notify Dr. Rito Ehrlich re: referral to I OP when medically stable  2. Pt is taking medications appropriate for bipolar D/O. Garnetta Fedrick J. Ferol Luz, MD Psychiatrist  08/09/2011 6:20 PM

## 2011-08-09 NOTE — Discharge Instructions (Signed)

## 2011-08-09 NOTE — Progress Notes (Signed)
Clinical Social Work Department CLINICAL SOCIAL WORK PSYCHIATRY SERVICE LINE ASSESSMENT 08/09/2011  Patient:  Stacey Weaver  Account:  [id number]  Admit Date:  08/07/2011  Clinical Social Worker:  Doroteo Glassman  Date/Time:  08/09/2011 12:00 N Referred by:  Physician  Date referred:  08/09/2011 Reason for Referral  Behavioral Health Issues   Presenting Symptoms/Problems (In the person's/family's own words):   Pt overdosed on an unknown amount of Benadryl and Wellbutrin.    Abuse/Neglect/Trauma Comments:   Psychiatric History (check all that apply)  Inpatient/hospitilization   Psychiatric medications:  See psych MD's note and H&P   Current Mental Health Hospitalizations/Previous Mental Health History:   Pt has been hospitalized at [facility], [facility] and [facility].   Current provider:   [facility]   Place and Date:   Current Medications:   See H&P   Previous Impatient Admission/Date/Reason:   Pt couldn't remember dates   Emotional Health / Current Symptoms    Suicide/Self Harm  Suicide attempt in past (date/description)   Suicide attempt in the past:   Pt denies current suicidal ideations   Other harmful behavior:   Denies    Other Psychotic/Dissociative Symptoms:   Denies     Other Attention / Behavioral Symptoms:   Denies     Other Cognitive Impairment:   Denies    Mood and Adjustment  DEPRESSION  Anxious    Stress, Anxiety, Trauma, Any Recent Loss/Stressor  Grief/Loss (recent or history)   Anxiety (frequency):   Pt reports stress related to her separation from her husband for 1 1/2 years and a lack of familial support, particularly from her mom.   Phobia (specify):   Compulsive behavior (specify):   Obsessive behavior (specify):   Other:    SBIRT completed (please refer for detailed history):    Self-reported substance use:   Urinary Drug Screen Completed:   Alcohol level:    Environmental/Housing/Living Arrangement    Stable housing   Who is in the home:   Emergency contact:   Patient's Strengths and Goals (patient's own words):   Pt is an Charity fundraiser at [employer]   Clinical Social Worker's Interpretive Summary:   Pt reports pxs with significant others.  She states that she and her mom have a poor relationship and that she is has been separated from her husband for 1 1/2 years.  Pt has been inpt at [facility], [facility] and [facility].  She does not want inpt tx.  She is followed by a psychiatrist at [facility] and is agreeable to therapy.   Disposition:  Outpatient referral made/needed  CSW to continue to follow.  Providence Crosby, LCSWA Clinical Social Work [phone]

## 2011-08-09 NOTE — Discharge Summary (Signed)
DISCHARGE SUMMARY  Stacey Weaver  MR#: [id number]  DOB:Aug 04, 1975  Date of Admission: 08/07/2011 Date of Discharge: 08/09/2011  Attending Physician:Meyer Arora K  Patient's PCP: She does not have one. She sees a Dr.Biryani who is her psychiatrist. Consults: Willeen Cass, psychiatry  Discharge Diagnoses: Present on Admission:  .SSRI overdose .Depression .Anxiety disorder .Obesity .Hypothyroidism .Rhabdomyolysis .Antihistamines overdose   Initial presentation: Patient is a 36 year old Afro-American female past medical history bipolar disorder, obesity and hypothyroidism who presented after a suicide attempt by ingestion of an unknown quantity of Benadryl and Wellbutrin. She presented quite agitated and tachycardic but otherwise lab work was normal. She was brought in for cardiac monitoring and psychiatric evaluation.  Hospital Course: Patient Active Problem List  Diagnoses  . SSRI overdose: Patient was monitored on telemetry. She had a mildly prolonged QT interval on followup EKG on hospital day 2. With IV fluids and further cardiac monitoring, she felt medically stable for discharge on 5/14.    . Depression: Patient was seen by psychiatry and felt that she's not suicidal. After some discussion, the plan will be patient will followup with outpatient services.   . Anxiety disorder: Patient initially came in quite agitated. She received when necessary IV and by mouth Ativan and this greatly improved her symptoms. She is stable.   . Obesity: Stable his hospitalization.   . Hypothyroidism: Continue Synthroid stable medical issue.   . Rhabdomyolysis: This is felt to be secondary to her Benadryl overdose. Following IV fluids, her CPK level the next day was in the 900s. She's continued on IV fluids and on day of discharge her CPK was down to 400. She's been discharged later in the afternoon and her CPK levels expected to resume to normal.   . Antihistamines overdose: Stable. See above.       Medication List  As of 08/09/2011  4:41 PM   TAKE these medications         acetaminophen 500 MG tablet   Commonly known as: TYLENOL   Take 1,000 mg by mouth every 6 (six) hours as needed. For pain relief      albuterol 108 (90 BASE) MCG/ACT inhaler   Commonly known as: PROVENTIL HFA;VENTOLIN HFA   Inhale 2 puffs into the lungs every 6 (six) hours as needed. For shortness of breath      ARIPiprazole 5 MG tablet   Commonly known as: ABILIFY   Take 5 mg by mouth at bedtime.      buPROPion 300 MG 24 hr tablet   Commonly known as: WELLBUTRIN XL   Take 300 mg by mouth daily.      calcium carbonate 500 MG chewable tablet   Commonly known as: TUMS - dosed in mg elemental calcium   Chew 1 tablet by mouth daily.      clonazePAM 1 MG tablet   Commonly known as: KLONOPIN   Take 1 mg by mouth daily as needed. For anxiety      EXCEDRIN MIGRAINE 250-250-65 MG per tablet   Generic drug: aspirin-acetaminophen-caffeine   Take 2 tablets by mouth every 6 (six) hours as needed. For pain      FLUoxetine 20 MG tablet   Commonly known as: PROZAC   Take 20 mg by mouth daily.      Ginkgo Biloba 40 MG Tabs   Take 60 mg by mouth daily.      ibuprofen 200 MG tablet   Commonly known as: ADVIL,MOTRIN   Take 800 mg by mouth every 6 (six)  hours as needed. For pain relief      lamoTRIgine 25 MG tablet   Commonly known as: LAMICTAL   Take 25 mg by mouth at bedtime.      levothyroxine 75 MCG tablet   Commonly known as: SYNTHROID, LEVOTHROID   Take 75 mcg by mouth daily.      meclizine 25 MG tablet   Commonly known as: ANTIVERT   Take 25 mg by mouth 3 (three) times daily as needed. For dizzines      metoCLOPramide 10 MG tablet   Commonly known as: REGLAN   Take 10 mg by mouth 4 (four) times daily.      traZODone 100 MG tablet   Commonly known as: DESYREL   Take 100 mg by mouth at bedtime.             Day of Discharge BP 118/81  Pulse 60  Temp(Src) 98.3 F (36.8 C) (Oral)   Resp 18  Ht 5\' 5"  (1.651 m)  Wt 86.183 kg (190 lb)  BMI 31.62 kg/m2  SpO2 100%  Physical Exam: General: Alert and oriented x3, no acute distress HEENT: Number cephalic, atraumatic, mucous members are moist Cardiovascular: Regular rate and rhythm, S1-S2 Lungs: Clear to auscultation bilaterally Abdomen: Soft, obese, nontender, positive bowel sounds Extremities: No clubbing or cyanosis or edema.  Results for orders placed during the hospital encounter of 08/07/11 (from the past 24 hour(s))  BASIC METABOLIC PANEL     Status: Abnormal   Collection Time   08/09/11  5:15 AM      Component Value Range   Sodium 136  135 - 145 (mEq/L)   Potassium 3.3 (*) 3.5 - 5.1 (mEq/L)   Chloride 105  96 - 112 (mEq/L)   CO2 21  19 - 32 (mEq/L)   Glucose, Bld 113 (*) 70 - 99 (mg/dL)   BUN 4 (*) 6 - 23 (mg/dL)   Creatinine, Ser 4.09  0.50 - 1.10 (mg/dL)   Calcium 8.6  8.4 - 81.1 (mg/dL)   GFR calc non Af Amer >90  >90 (mL/min)   GFR calc Af Amer >90  >90 (mL/min)  CARDIAC PANEL(CRET KIN+CKTOT+MB+TROPI)     Status: Abnormal   Collection Time   08/09/11  5:15 AM      Component Value Range   Total CK 551 (*) 7 - 177 (U/L)   CK, MB 1.3  0.3 - 4.0 (ng/mL)   Troponin I <0.30  <0.30 (ng/mL)   Relative Index 0.2  0.0 - 2.5   URINALYSIS, ROUTINE W REFLEX MICROSCOPIC     Status: Abnormal   Collection Time   08/09/11  5:42 AM      Component Value Range   Color, Urine YELLOW  YELLOW    APPearance CLOUDY (*) CLEAR    Specific Gravity, Urine 1.012  1.005 - 1.030    pH 6.5  5.0 - 8.0    Glucose, UA NEGATIVE  NEGATIVE (mg/dL)   Hgb urine dipstick NEGATIVE  NEGATIVE    Bilirubin Urine NEGATIVE  NEGATIVE    Ketones, ur NEGATIVE  NEGATIVE (mg/dL)   Protein, ur NEGATIVE  NEGATIVE (mg/dL)   Urobilinogen, UA 0.2  0.0 - 1.0 (mg/dL)   Nitrite NEGATIVE  NEGATIVE    Leukocytes, UA NEGATIVE  NEGATIVE     Disposition: Improved, being discharged home   Follow-up Appts: Discharge Orders    Future Orders Please  Complete By Expires   Diet - low sodium heart healthy  Increase activity slowly           Tests Needing Follow-up: None  Time spent in discharge (includes decision making & examination of pt): 35 minutes  Signed: Hollice Espy 08/09/2011, 4:41 PM

## 2011-08-10 LAB — URINE CULTURE: Colony Count: 45000

## 2011-08-23 ENCOUNTER — Emergency Department (HOSPITAL_COMMUNITY)
Admission: EM | Admit: 2011-08-23 | Discharge: 2011-08-24 | Disposition: A | Discharge status: Home / Self Care | Payer: Self-pay | Attending: Emergency Medicine | Admitting: Emergency Medicine

## 2011-08-23 ENCOUNTER — Encounter (HOSPITAL_COMMUNITY): Payer: Self-pay | Admitting: *Deleted

## 2011-08-23 DIAGNOSIS — J45909 Unspecified asthma, uncomplicated: Secondary | ICD-10-CM | POA: Insufficient documentation

## 2011-08-23 DIAGNOSIS — Z79899 Other long term (current) drug therapy: Secondary | ICD-10-CM | POA: Insufficient documentation

## 2011-08-23 DIAGNOSIS — F319 Bipolar disorder, unspecified: Secondary | ICD-10-CM | POA: Insufficient documentation

## 2011-08-23 DIAGNOSIS — IMO0002 Reserved for concepts with insufficient information to code with codable children: Secondary | ICD-10-CM | POA: Insufficient documentation

## 2011-08-23 DIAGNOSIS — F411 Generalized anxiety disorder: Secondary | ICD-10-CM | POA: Insufficient documentation

## 2011-08-23 LAB — COMPREHENSIVE METABOLIC PANEL
ALT: 14 U/L (ref 0–35)
Alkaline Phosphatase: 104 U/L (ref 39–117)
BUN: 7 mg/dL (ref 6–23)
CO2: 19 mEq/L (ref 19–32)
Calcium: 8.7 mg/dL (ref 8.4–10.5)
GFR calc Af Amer: 81 mL/min — ABNORMAL LOW (ref >90)
GFR calc non Af Amer: 70 mL/min — ABNORMAL LOW (ref >90)
Glucose, Bld: 131 mg/dL — ABNORMAL HIGH (ref 70–99)
Sodium: 138 mEq/L (ref 135–145)

## 2011-08-23 LAB — CBC
HCT: 41.5 % (ref 36.0–46.0)
Hemoglobin: 13.4 g/dL (ref 12.0–15.0)
MCH: 28 pg (ref 26.0–34.0)
MCV: 86.8 fL (ref 78.0–100.0)
Platelets: 255 10*3/uL (ref 150–400)
RBC: 4.78 MIL/uL (ref 3.87–5.11)
WBC: 7.5 10*3/uL (ref 4.0–10.5)

## 2011-08-23 MED ORDER — IBUPROFEN 800 MG PO TABS: 800.0000 mg | ORAL_TABLET | Freq: Once | ORAL | Status: DC

## 2011-08-23 MED ORDER — LORAZEPAM 2 MG/ML IJ SOLN
1.0000 mg | Freq: Once | INTRAMUSCULAR | Status: AC
  Administered 2011-08-23: 1 mg via INTRAVENOUS
  Filled 2011-08-23: qty 1

## 2011-08-23 MED ORDER — POTASSIUM CHLORIDE CRYS ER 20 MEQ PO TBCR
40.0000 meq | EXTENDED_RELEASE_TABLET | Freq: Once | ORAL | Status: AC
  Administered 2011-08-24: 40 meq via ORAL
  Filled 2011-08-23: qty 2

## 2011-08-23 MED ORDER — MORPHINE SULFATE 4 MG/ML IJ SOLN
4.0000 mg | Freq: Once | INTRAMUSCULAR | Status: AC
  Administered 2011-08-23: 4 mg via INTRAVENOUS
  Filled 2011-08-23: qty 1

## 2011-08-23 NOTE — ED Notes (Signed)
Pt unable to urinate.

## 2011-08-23 NOTE — ED Notes (Signed)
Pt informed by April Whitlow EMT that we need urine sample.

## 2011-08-23 NOTE — ED Notes (Signed)
RN at bedside. Will attempt for labs after.

## 2011-08-23 NOTE — ED Provider Notes (Signed)
History     CSN: [id number]  Arrival date & time 08/23/11  2108   First MD Initiated Contact with Patient 08/23/11 2134      Chief Complaint  Patient presents with  . Manic Behavior    Bipolar and off medication   level V caveat due to psychiatric disease  (Consider location/radiation/quality/duration/timing/severity/associated sxs/prior treatment) The history is provided by the patient.   patient was brought in for being combative and angry at home. Reportedly she had been fighting with her mother. She's been throwing things on scene. She's had a recent overdose attempt, but no overdoses time. She is reportedly not been taking her medicines.  Past Medical History  Diagnosis Date  . Bipolar 1 disorder   . Asthma   . Migraine   . Anxiety     Past Surgical History  Procedure Date  . Knee surgery     History reviewed. No pertinent family history.  History  Substance Use Topics  . Smoking status: Never Smoker   . Smokeless tobacco: Not on file  . Alcohol Use: No    OB History    Grav Para Term Preterm Abortions TAB SAB Ect Mult Living                  Review of Systems  Unable to perform ROS: Psychiatric disorder    Allergies  Almotriptan malate; Zomig; and Cymbalta  Home Medications   Current Outpatient Rx  Name Route Sig Dispense Refill  . ACETAMINOPHEN 500 MG PO TABS Oral Take 1,000 mg by mouth every 6 (six) hours as needed. For pain relief    . ALBUTEROL SULFATE HFA 108 (90 BASE) MCG/ACT IN AERS Inhalation Inhale 2 puffs into the lungs every 6 (six) hours as needed. For shortness of breath    . ARIPIPRAZOLE 5 MG PO TABS Oral Take 5 mg by mouth at bedtime.      . ASPIRIN-ACETAMINOPHEN-CAFFEINE 250-250-65 MG PO TABS Oral Take 2 tablets by mouth every 6 (six) hours as needed. For pain    . BUPROPION HCL ER (XL) 300 MG PO TB24 Oral Take 300 mg by mouth daily.    [name] CALCIUM CARBONATE ANTACID 500 MG PO CHEW Oral Chew 1 tablet by mouth daily.    [name] CLONAZEPAM 1  MG PO TABS Oral Take 1 mg by mouth daily as needed. For anxiety     . FLUOXETINE HCL 20 MG PO TABS Oral Take 20 mg by mouth daily.    [name] GINKGO BILOBA 40 MG PO TABS Oral Take 60 mg by mouth daily.    . IBUPROFEN 200 MG PO TABS Oral Take 800 mg by mouth every 6 (six) hours as needed. For pain relief    . LAMOTRIGINE 25 MG PO TABS Oral Take 25 mg by mouth at bedtime.      [name] LEVOTHYROXINE SODIUM 75 MCG PO TABS Oral Take 75 mcg by mouth daily.    [name] MECLIZINE HCL 25 MG PO TABS Oral Take 25 mg by mouth 3 (three) times daily as needed. For dizzines    . METOCLOPRAMIDE HCL 10 MG PO TABS Oral Take 10 mg by mouth 4 (four) times daily.    . TRAZODONE HCL 100 MG PO TABS Oral Take 100 mg by mouth at bedtime.        BP 109/60  Pulse 127  Temp(Src) 97.5 F (36.4 C) (Oral)  Wt 190 lb (86.183 kg)  SpO2 98%  Physical Exam  Constitutional: She appears well-developed  and well-nourished.  HENT:  Head: Normocephalic.  Cardiovascular: Normal rate.   Pulmonary/Chest: Effort normal and breath sounds normal.  Abdominal: Soft. Bowel sounds are normal.  Musculoskeletal: Normal range of motion.  Neurological: She is alert.  Psychiatric:       Patient is somewhat anxious and agitated.    ED Course  Procedures (including critical care time)  Labs Reviewed  COMPREHENSIVE METABOLIC PANEL - Abnormal; Notable for the following:    Potassium 2.9 (*)    Glucose, Bld 131 (*)    GFR calc non Af Amer 70 (*)    GFR calc Af Amer 81 (*)    All other components within normal limits  CBC  ETHANOL  ACETAMINOPHEN LEVEL  URINALYSIS, ROUTINE W REFLEX MICROSCOPIC  URINE RAPID DRUG SCREEN (HOSP PERFORMED)   No results found.   1. Bipolar disorder       MDM  Patient angry and began throwing things after a disagreement with her mother. She's a psychiatric history is reportedly not been taking her medications. Patient's brother states she started to calm down this is towards the middle of the spectrum of her  disease. She's not been involuntarily committed at this time, however if she attempts to leave before psychiatric evaluation I would get a commitment papers. Telepsych has been ordered        [employer]. Rubin Payor, MD 08/24/11 0005

## 2011-08-23 NOTE — ED Notes (Signed)
[id number]<AV> Expected date:<BR> Expected time: 9:04 PM<BR> Means of arrival:Ambulance<BR> Comments:<BR> PTAR 32 -- Bipolar

## 2011-08-23 NOTE — ED Notes (Signed)
Unable to get labs from pt at this time

## 2011-08-23 NOTE — ED Notes (Signed)
Per EMS: patient from home with hx of bipolar disorder, cousin and aunt sts patient has been off of medication. Patient belligerent on scene. NAD at this time.

## 2011-08-23 NOTE — ED Notes (Signed)
Pt sts she has not been taking her medication everyday like she is prescribed due to the fact that she does not have insurance and she wants to make her prescriptions last. Sts that she was sleeping and was woken up by Cops coming into her front door and taking her to [facility].

## 2011-08-24 MED ORDER — ARIPIPRAZOLE 5 MG PO TABS
5.0000 mg | ORAL_TABLET | Freq: Every day | ORAL | Status: DC
  Filled 2011-08-24: qty 1

## 2011-08-24 MED ORDER — CLONAZEPAM 0.5 MG PO TABS
1.0000 mg | ORAL_TABLET | Freq: Three times a day (TID) | ORAL | Status: DC | PRN
  Administered 2011-08-24: 1 mg via ORAL
  Filled 2011-08-24: qty 2

## 2011-08-24 MED ORDER — CLONAZEPAM 1 MG PO TABS: 1.0000 mg | ORAL_TABLET | Freq: Two times a day (BID) | ORAL | Status: DC | PRN

## 2011-08-24 MED ORDER — BUPROPION HCL ER (XL) 300 MG PO TB24
300.0000 mg | ORAL_TABLET | Freq: Every day | ORAL | Status: DC
  Filled 2011-08-24: qty 1

## 2011-08-24 MED ORDER — TRAZODONE HCL 100 MG PO TABS: 100.0000 mg | ORAL_TABLET | Freq: Every day | ORAL | Status: DC

## 2011-08-24 MED ORDER — IBUPROFEN 800 MG PO TABS: 800.0000 mg | ORAL_TABLET | Freq: Four times a day (QID) | ORAL | Status: DC | PRN

## 2011-08-24 MED ORDER — ONDANSETRON 4 MG PO TBDP: 4.0000 mg | ORAL_TABLET | Freq: Once | ORAL | Status: DC

## 2011-08-24 MED ORDER — LEVOTHYROXINE SODIUM 75 MCG PO TABS
75.0000 ug | ORAL_TABLET | Freq: Every day | ORAL | Status: DC
  Filled 2011-08-24 (×2): qty 1

## 2011-08-24 MED ORDER — FLUOXETINE HCL 20 MG PO TABS
20.0000 mg | ORAL_TABLET | Freq: Every day | ORAL | Status: DC
  Filled 2011-08-24: qty 1

## 2011-08-24 MED ORDER — LAMOTRIGINE 25 MG PO TABS
25.0000 mg | ORAL_TABLET | Freq: Every day | ORAL | Status: DC
Start: 2011-08-24 — End: 2011-08-24
  Filled 2011-08-24: qty 1

## 2011-08-24 NOTE — ED Notes (Signed)
[facility] assessment team has spoke with patient and gotten a No Harm contract signed. Patient will not allow Allyne Gee to obtain blood sample for TSH test, will try to obtain blood.
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
## 2011-08-24 NOTE — ED Notes (Signed)
Patient given discharge instructions, information, prescriptions, and diet order. Patient states that they adequately understand discharge information given and to return to ED if symptoms return or worsen.    ACT team assessed patient and obtained No harm contract. Patient stable, ambulatory but agitated at this time. Patient given follow up information and resource guide from act team.

## 2011-08-24 NOTE — ED Notes (Signed)
Pt would not let me stick her for labs. RN aware

## 2011-08-24 NOTE — Discharge Instructions (Signed)
Manic Depression (Bipolar Disorder)  Bipolar disorder is also known as manic depressive illness. It is when the brain does not function properly and causes shifts in a person's moods, energy and ability to function in everyday life. These shifts are different from the normal ups and downs that everyone experiences. Instead the shifts are severe. If this goes untreated, the person's life becomes more and more disorderly. People with this disorder can be treated can lead full and productive lives. This disorder must be managed throughout life.   SYMPTOMS    Bipolar disorder causes dramatic mood swings. These mood swings go in cycles. They cycle from extreme "highs" and irritable to deep "lows" of sadness and hopelessness.   Between the extreme moods, there are usually periods of normal mood.   Along with the mood shifts, the person will have severe changes in energy and behavior. The periods of "highs" and "lows" are called episodes of mania and depression.  Signs of mania:   Lots of energy, activity and restlessness.   Extreme "high" or good mood.   Extreme irritability.   Racing thoughts and talking very fast.   Jumping from one idea to another.   Not able to focus, easily distracted.   Little need to sleep.   Grand beliefs in one's abilities and powers.   Spending sprees.   Increased sexual drive. This can result in many sexual partners.   Poor judgment.   Abuse of drugs, particularly cocaine, alcohol, and sleeping medication.   Aggressive or provocative behavior.   A lasting period of behavior that is different from usual.   Denial that anything is wrong.  *A manic episode is identified if a "high" mood happens with three or more of the other symptoms lasting most of the day, nearly everyday for a week or longer. If the mood is more irritable in nature, four additional symptoms must be present.  Signs of depression:   Lasting feelings of sadness, anxiety, or empty mood.   Feelings of  hopelessness with negative thoughts.   Feelings of guilt, worthlessness, or helplessness.   Loss of interest or pleasure in activities once enjoyed, including sex.   Feelings of fatigue or having less energy.   Trouble focusing, making decisions, remembering.   Feeling restless or irritable.   Sleeping too little or too much.   Change in eating with possible weight gain or loss.   Feeling ongoing pain that is not caused by physical illness or injury.   Thoughts of death or suicide or suicide attempts.  *A depressive episode is identified as having five or more of the above symptoms that last most of the day, nearly everyday for two weeks or longer.  CAUSES    Research shows that there is no single cause for the disorder. Many factors act together to produce the illness.   This can be passed down from family (hereditary).   Environment may play a part.  TREATMENT    Long-term treatment is strongly recommended because bipolar disorder is a repeated illness. This disorder is better controlled if treatment is ongoing than if it is off and on.   A combination of medication and talk therapy is best for managing the disorder over time.   Medication.   Medication can be prescribed by a doctor that is an expert in treating mental disorders (psychiatrists). Medications known as "mood stabilizers" are usually prescribed to help control the illness. Other medications can be added when needed. These medicines usually treat episodes   mood stabilizer.   Talk Therapy.   Along with medication, some forms of talk therapy are helpful in providing support, education and guidance to people with the illness and their families. Studies show that this type of treatment increases mood stability, decreases need for hospitalization and improves how they function society.   Electroconvulsive Therapy (ECT).   In extreme situations where  the above treatments do not work or work too slowly to relieve severe symptoms, ECT may be considered.  Document Released: 06/21/2000 Document Revised: 03/04/2011 Document Reviewed: 02/10/2007 [facility] Patient Information 2012 [place], [place].  RESOURCE GUIDE  Dental Problems  Patients with Medicaid: [facility] [id number]                                           [phone] [address] Phone:  [phone]                                                  Phone:  [phone]  If unable to pay or uninsured, contact:  [facility] or [facility]. to become qualified for the adult dental clinic.  Chronic Pain Problems Contact [facility]  [phone] Patients need to be referred by their primary care doctor.  Insufficient Money for Medicine Contact [employer]:  call "211" or [facility] (934)602-4268.  No Primary Care Doctor Call [facility]  [phone] Other agencies that provide inexpensive medical care    [facility]  [phone]    [facility] Internal Medicine  [phone]    [facility]  [phone]    [facility]  [phone]    [facility]  [phone]    [facility]  [phone]  Psychological Services [facility] Behavioral Health  [phone] [facility] Services  [phone] [facility] Mental Health   [phone] (emergency services [phone])  Substance Abuse Resources [facility]  [phone] [facility] [phone] [facility] [phone] Floydene Flock [phone] Residential & Outpatient Substance Abuse Program  [phone]  Abuse/Neglect [facility] Child Abuse Hotline [phone] [facility] Child Abuse Hotline [phone] (After Hours)  Emergency Shelter [facility] 8045564433  Maternity Homes [facility] [place] [facility] [phone] [facility] Services [phone]  MRSA Hotline #:   425 540 4352    [facility] Resources  [facility]     [employer]                          [facility] Dept. [address] [place]                       [address]      [address]  [address]  Sela Hua Phone:  [phone]                                   Phone:  [phone]                 Phone:  [place] Phone:  [place] [phone] (After Hours)

## 2011-08-24 NOTE — ED Notes (Signed)
ACT team at bedside. Will get labs when finished.

## 2011-08-24 NOTE — ED Notes (Signed)
Telepsyche faxed and verified. Telepsyche set up and waiting for MD call in.

## 2011-08-24 NOTE — ED Provider Notes (Signed)
Patient evaluated by a telemetry psychiatry. She is deemed safe for discharge. She was able to sign a Engineer, manufacturing systems. She'll be discharged home with outpatient treatment referral.  Dayton Bailiff, MD 08/24/11 [phone]

## 2011-09-29 ENCOUNTER — Encounter (HOSPITAL_COMMUNITY): Payer: Self-pay | Admitting: Emergency Medicine

## 2011-09-29 ENCOUNTER — Emergency Department (HOSPITAL_COMMUNITY): Payer: Self-pay

## 2011-09-29 ENCOUNTER — Emergency Department (HOSPITAL_COMMUNITY)
Admission: EM | Admit: 2011-09-29 | Discharge: 2011-09-29 | Disposition: A | Discharge status: Home / Self Care | Payer: Self-pay | Attending: Emergency Medicine | Admitting: Emergency Medicine

## 2011-09-29 DIAGNOSIS — S060XAA Concussion with loss of consciousness status unknown, initial encounter: Secondary | ICD-10-CM | POA: Insufficient documentation

## 2011-09-29 DIAGNOSIS — S0990XA Unspecified injury of head, initial encounter: Secondary | ICD-10-CM

## 2011-09-29 DIAGNOSIS — W19XXXA Unspecified fall, initial encounter: Secondary | ICD-10-CM | POA: Insufficient documentation

## 2011-09-29 DIAGNOSIS — Z7982 Long term (current) use of aspirin: Secondary | ICD-10-CM | POA: Insufficient documentation

## 2011-09-29 DIAGNOSIS — F411 Generalized anxiety disorder: Secondary | ICD-10-CM | POA: Insufficient documentation

## 2011-09-29 DIAGNOSIS — F319 Bipolar disorder, unspecified: Secondary | ICD-10-CM | POA: Insufficient documentation

## 2011-09-29 DIAGNOSIS — J45909 Unspecified asthma, uncomplicated: Secondary | ICD-10-CM | POA: Insufficient documentation

## 2011-09-29 DIAGNOSIS — Z79899 Other long term (current) drug therapy: Secondary | ICD-10-CM | POA: Insufficient documentation

## 2011-09-29 DIAGNOSIS — S060X9A Concussion with loss of consciousness of unspecified duration, initial encounter: Secondary | ICD-10-CM

## 2011-09-29 DIAGNOSIS — N390 Urinary tract infection, site not specified: Secondary | ICD-10-CM | POA: Insufficient documentation

## 2011-09-29 DIAGNOSIS — Y92009 Unspecified place in unspecified non-institutional (private) residence as the place of occurrence of the external cause: Secondary | ICD-10-CM | POA: Insufficient documentation

## 2011-09-29 LAB — URINALYSIS, ROUTINE W REFLEX MICROSCOPIC
Glucose, UA: NEGATIVE mg/dL
Hgb urine dipstick: NEGATIVE
Ketones, ur: NEGATIVE mg/dL
Protein, ur: NEGATIVE mg/dL

## 2011-09-29 LAB — URINE MICROSCOPIC-ADD ON

## 2011-09-29 MED ORDER — CEPHALEXIN 500 MG PO CAPS: 500.0000 mg | ORAL_CAPSULE | Freq: Three times a day (TID) | ORAL | Status: AC

## 2011-09-29 MED ORDER — PROMETHAZINE HCL 25 MG PO TABS: 25.0000 mg | ORAL_TABLET | Freq: Four times a day (QID) | ORAL | Status: DC | PRN

## 2011-09-29 MED ORDER — HYDROCODONE-ACETAMINOPHEN 5-500 MG PO TABS: 1.0000 | ORAL_TABLET | Freq: Four times a day (QID) | ORAL | Status: AC | PRN

## 2011-09-29 MED ORDER — NAPROXEN 500 MG PO TABS: 500.0000 mg | ORAL_TABLET | Freq: Two times a day (BID) | ORAL | Status: DC

## 2011-09-29 NOTE — ED Notes (Addendum)
Pt reported falling on Monday. Pt today complaints of headache, fainting, nausea, blurred vision. Pt fell in mother's house on wood floors.Pt hit head on floor. Pt cannot remember if she went to the ED or MD since fall on Monday. Pt has hx of migraines and stated that this is not like a migraine.

## 2011-09-29 NOTE — ED Provider Notes (Addendum)
History     CSN: [id number]  Arrival date & time 09/29/11  1348   First MD Initiated Contact with Patient 09/29/11 1518      Chief Complaint  Patient presents with  . Fall    (Consider location/radiation/quality/duration/timing/severity/associated sxs/prior treatment) HPI Comments: 36 year old female with a history of bipolar disorder on multiple medications presents with headache, nausea blurry vision, loss of balance since a fall resulting in a head injury 2 days ago.  He states that she was standing up to go to her mother's room when she got dizzy fell backwards and struck the back of her head. She had a loss of consciousness that lasted approximately 2 minutes after which time she awoke feeling dizzy. Since this time she has had difficulty walking, persistent headache, associated generalized blurry vision which is binocular. She does also have nausea. She denies chest, vomiting, fevers, chills, dysuria, diarrhea, rashes, swelling. She denies any history of significant head injuries or concussions. She has had no medications prior to arrival. She states that usually her headaches are on the left side however this one is occipital persistent and different in character. She has mild associated neck pain since the fall  Patient is a 36 y.o. female presenting with fall. The history is provided by the patient.  Fall    Past Medical History  Diagnosis Date  . Bipolar 1 disorder   . Asthma   . Migraine   . Anxiety     Past Surgical History  Procedure Date  . Knee surgery     No family history on file.  History  Substance Use Topics  . Smoking status: Never Smoker   . Smokeless tobacco: Not on file  . Alcohol Use: No    OB History    Grav Para Term Preterm Abortions TAB SAB Ect Mult Living                  Review of Systems  All other systems reviewed and are negative.    Allergies  Almotriptan malate; Zomig; and Cymbalta  Home Medications   Current Outpatient  Rx  Name Route Sig Dispense Refill  . ACETAMINOPHEN 500 MG PO TABS Oral Take 1,000 mg by mouth every 6 (six) hours as needed. For pain relief    . ALBUTEROL SULFATE HFA 108 (90 BASE) MCG/ACT IN AERS Inhalation Inhale 2 puffs into the lungs every 6 (six) hours as needed. For shortness of breath    . ARIPIPRAZOLE 5 MG PO TABS Oral Take 5 mg by mouth at bedtime.      . ASPIRIN-ACETAMINOPHEN-CAFFEINE 250-250-65 MG PO TABS Oral Take 2 tablets by mouth every 6 (six) hours as needed. For pain    . CALCIUM CARBONATE ANTACID 500 MG PO CHEW Oral Chew 1 tablet by mouth daily.    [name] CLONAZEPAM 1 MG PO TABS Oral Take 1 mg by mouth daily as needed. For anxiety     . FLUOXETINE HCL 20 MG PO TABS Oral Take 20 mg by mouth daily.    . IBUPROFEN 200 MG PO TABS Oral Take 800 mg by mouth every 6 (six) hours as needed. For pain relief    . LAMOTRIGINE 25 MG PO TABS Oral Take 25 mg by mouth at bedtime.      [name] MECLIZINE HCL 25 MG PO TABS Oral Take 25 mg by mouth 3 (three) times daily as needed. For dizzines    . TRAZODONE HCL 100 MG PO TABS Oral Take 100 mg by  mouth at bedtime.      [name] CLONAZEPAM 1 MG PO TABS Oral Take 1 tablet (1 mg total) by mouth 2 (two) times daily as needed for anxiety. 14 tablet 0  . HYDROCODONE-ACETAMINOPHEN 5-500 MG PO TABS Oral Take 1-2 tablets by mouth every 6 (six) hours as needed for pain. 10 tablet 0  . NAPROXEN 500 MG PO TABS Oral Take 1 tablet (500 mg total) by mouth 2 (two) times daily with a meal. 30 tablet 0  . PROMETHAZINE HCL 25 MG PO TABS Oral Take 1 tablet (25 mg total) by mouth every 6 (six) hours as needed for nausea. 12 tablet 0    BP 92/67  Pulse 102  Temp 97.8 F (36.6 C) (Oral)  Resp 16  SpO2 96%  Physical Exam  Nursing note and vitals reviewed. Constitutional: She appears well-developed and well-nourished. No distress.  HENT:  Head: Normocephalic and atraumatic.  Mouth/Throat: Oropharynx is clear and moist. No oropharyngeal exudate.       Tenderness over the  occipital scalp, no associated hematomas or lacerations  no facial tenderness, deformity, malocclusion or hemotympanum.  no battle's sign or racoon eyes.   Eyes: Conjunctivae and EOM are normal. Pupils are equal, round, and reactive to light. Right eye exhibits no discharge. Left eye exhibits no discharge. No scleral icterus.  Neck: Normal range of motion. Neck supple. No JVD present. No thyromegaly present.  Cardiovascular: Normal rate, regular rhythm, normal heart sounds and intact distal pulses.  Exam reveals no gallop and no friction rub.   No murmur heard. Pulmonary/Chest: Effort normal and breath sounds normal. No respiratory distress. She has no wheezes. She has no rales.  Abdominal: Soft. Bowel sounds are normal. She exhibits no distension and no mass. There is no tenderness.  Musculoskeletal: Normal range of motion. She exhibits tenderness ( Tender to palpation over the mid cervical spine). She exhibits no edema.  Lymphadenopathy:    She has no cervical adenopathy.  Neurological: She is alert. Coordination normal.       Speech is clear and goal oriented, cranial nerves III through XII intact, normal limb coordination, no limb ataxia, follows commands without difficulty, normal strength in all 4 extremities, normal sensation to light touch in all 4 extremities, no pronator drift  Skin: Skin is warm and dry. No rash noted. No erythema.  Psychiatric: She has a normal mood and affect. Her behavior is normal.    ED Course  Procedures (including critical care time)  Results for orders placed during the hospital encounter of 09/29/11  URINALYSIS, ROUTINE W REFLEX MICROSCOPIC      Component Value Range   Color, Urine YELLOW  YELLOW   APPearance CLOUDY (*) CLEAR   Specific Gravity, Urine 1.025  1.005 - 1.030   pH 6.0  5.0 - 8.0   Glucose, UA NEGATIVE  NEGATIVE mg/dL   Hgb urine dipstick NEGATIVE  NEGATIVE   Bilirubin Urine NEGATIVE  NEGATIVE   Ketones, ur NEGATIVE  NEGATIVE mg/dL    Protein, ur NEGATIVE  NEGATIVE mg/dL   Urobilinogen, UA 0.2  0.0 - 1.0 mg/dL   Nitrite NEGATIVE  NEGATIVE   Leukocytes, UA MODERATE (*) NEGATIVE  URINE MICROSCOPIC-ADD ON      Component Value Range   Squamous Epithelial / LPF FEW (*) RARE   WBC, UA 11-20  <3 WBC/hpf   Bacteria, UA FEW (*) RARE   Urine-Other MUCOUS PRESENT     Ct Head Wo Contrast  09/29/2011  *RADIOLOGY REPORT*  Clinical Data: Fall, headache, nausea, blurred vision  CT HEAD WITHOUT CONTRAST  Technique:  Contiguous axial images were obtained from the base of the skull through the vertex without contrast.  Comparison: 06/14/2011  Findings: No evidence of parenchymal hemorrhage or extra-axial fluid collection. No mass lesion, mass effect, or midline shift.  No CT evidence of acute infarction.  Cerebral volume is age appropriate.  No ventriculomegaly.  The visualized paranasal sinuses are essentially clear. The mastoid air cells are unopacified.  No evidence of calvarial fracture.  IMPRESSION: Normal head CT.  Original Report Authenticated By: Charline Bills, M.D.    1. Head injury   2. Concussion   3.  UTI    MDM  At this time the patient appears to have suffered a minor head injury, has persistent symptoms likely concussive in nature, the patient is not on anticoagulants, worsening neurologic symptoms warrants imaging. Patient declines pain medications until after imaging back.  CT scan of the head reviewed by myself, there are no acute findings to suggest intracranial injury. The patient has a normal neurologic exam, states that she feels slightly improved and is amenable to discharge with prescription medications. I have given her verbal instructions on followup, a resource list of phone numbers and medications to treat headache and nausea for the next several days.  Discharge Prescriptions include:  Naprosyn Hydrocodone (10 tabs) Phenergan Keflex     Vida Roller, MD 09/29/11 1723  Vida Roller,  MD 09/29/11 [phone]

## 2011-10-05 ENCOUNTER — Encounter (HOSPITAL_BASED_OUTPATIENT_CLINIC_OR_DEPARTMENT_OTHER): Payer: Self-pay | Admitting: Emergency Medicine

## 2011-10-05 ENCOUNTER — Emergency Department (HOSPITAL_BASED_OUTPATIENT_CLINIC_OR_DEPARTMENT_OTHER)
Admission: EM | Admit: 2011-10-05 | Discharge: 2011-10-06 | Disposition: A | Discharge status: Home / Self Care | Payer: Self-pay | Attending: Emergency Medicine | Admitting: Emergency Medicine

## 2011-10-05 DIAGNOSIS — F0781 Postconcussional syndrome: Secondary | ICD-10-CM | POA: Insufficient documentation

## 2011-10-05 DIAGNOSIS — Z79899 Other long term (current) drug therapy: Secondary | ICD-10-CM | POA: Insufficient documentation

## 2011-10-05 DIAGNOSIS — N390 Urinary tract infection, site not specified: Secondary | ICD-10-CM | POA: Insufficient documentation

## 2011-10-05 DIAGNOSIS — E860 Dehydration: Secondary | ICD-10-CM | POA: Insufficient documentation

## 2011-10-05 DIAGNOSIS — J45909 Unspecified asthma, uncomplicated: Secondary | ICD-10-CM | POA: Insufficient documentation

## 2011-10-05 DIAGNOSIS — F319 Bipolar disorder, unspecified: Secondary | ICD-10-CM | POA: Insufficient documentation

## 2011-10-05 LAB — URINE MICROSCOPIC-ADD ON

## 2011-10-05 LAB — URINALYSIS, ROUTINE W REFLEX MICROSCOPIC
Nitrite: NEGATIVE
Specific Gravity, Urine: 1.011 (ref 1.005–1.030)
pH: 6 (ref 5.0–8.0)

## 2011-10-05 LAB — CBC WITH DIFFERENTIAL/PLATELET
Basophils Absolute: 0 10*3/uL (ref 0.0–0.1)
HCT: 40 % (ref 36.0–46.0)
Hemoglobin: 13.2 g/dL (ref 12.0–15.0)
Lymphocytes Relative: 35 % (ref 12–46)
Monocytes Absolute: 0.6 10*3/uL (ref 0.1–1.0)
Neutro Abs: 3.7 10*3/uL (ref 1.7–7.7)
RDW: 13.1 % (ref 11.5–15.5)
WBC: 6.8 10*3/uL (ref 4.0–10.5)

## 2011-10-05 LAB — BASIC METABOLIC PANEL
Chloride: 97 mEq/L (ref 96–112)
Creatinine, Ser: 1.1 mg/dL (ref 0.50–1.10)
GFR calc Af Amer: 74 mL/min — ABNORMAL LOW (ref >90)
Potassium: 3.2 mEq/L — ABNORMAL LOW (ref 3.5–5.1)

## 2011-10-05 MED ORDER — HYDROCODONE-ACETAMINOPHEN 5-325 MG PO TABS: 2.0000 | ORAL_TABLET | ORAL | Status: AC | PRN

## 2011-10-05 MED ORDER — SULFAMETHOXAZOLE-TMP DS 800-160 MG PO TABS: 1.0000 | ORAL_TABLET | Freq: Once | ORAL | Status: DC

## 2011-10-05 MED ORDER — SULFAMETHOXAZOLE-TRIMETHOPRIM 800-160 MG PO TABS: 1.0000 | ORAL_TABLET | Freq: Two times a day (BID) | ORAL | Status: AC

## 2011-10-05 MED ORDER — SODIUM CHLORIDE 0.9 % IV BOLUS (SEPSIS)
1000.0000 mL | Freq: Once | INTRAVENOUS | Status: AC
  Administered 2011-10-05: 1000 mL via INTRAVENOUS

## 2011-10-05 NOTE — ED Provider Notes (Signed)
History     CSN: [id number]  Arrival date & time 10/05/11  8416   First MD Initiated Contact with Patient 10/05/11 2013      Chief Complaint  Patient presents with  . Headache  . Blurred Vision    (Consider location/radiation/quality/duration/timing/severity/associated sxs/prior treatment) HPI Pt reports she had a syncopal episode about a week ago, fell and hit her head. She had 2 days of headaches and dizziness before going to the ED for eval where she had a negative head CT. She has continued to have headache, lightheadedness, blurry vision, nausea and vomiting. She was also diagnosed with UTI when she was at [facility].   Past Medical History  Diagnosis Date  . Bipolar 1 disorder   . Asthma   . Migraine   . Anxiety     Past Surgical History  Procedure Date  . Knee surgery     No family history on file.  History  Substance Use Topics  . Smoking status: Never Smoker   . Smokeless tobacco: Not on file  . Alcohol Use: No    OB History    Grav Para Term Preterm Abortions TAB SAB Ect Mult Living                  Review of Systems All other systems reviewed and are negative except as noted in HPI.   Allergies  Almotriptan malate; Zomig; Cymbalta; and Imitrex  Home Medications   Current Outpatient Rx  Name Route Sig Dispense Refill  . ARIPIPRAZOLE 5 MG PO TABS Oral Take 5 mg by mouth at bedtime. Patient uses this medication to aid with sleep.    . ASPIRIN-ACETAMINOPHEN-CAFFEINE 250-250-65 MG PO TABS Oral Take 2 tablets by mouth every 6 (six) hours as needed. For pain    . CALCIUM CARBONATE ANTACID 500 MG PO CHEW Oral Chew 1 tablet by mouth daily.    . CEPHALEXIN 500 MG PO CAPS Oral Take 1 capsule (500 mg total) by mouth 3 (three) times daily. 21 capsule 0  . CLONAZEPAM 1 MG PO TABS Oral Take 1 mg by mouth daily as needed. For anxiety     . FLUOXETINE HCL 20 MG PO TABS Oral Take 20 mg by mouth daily.    [name] HYDROCODONE-ACETAMINOPHEN 5-500 MG PO TABS Oral Take 1-2  tablets by mouth every 6 (six) hours as needed for pain. 10 tablet 0  . IBUPROFEN 200 MG PO TABS Oral Take 800 mg by mouth every 6 (six) hours as needed. For pain relief    . LAMOTRIGINE 25 MG PO TABS Oral Take 25 mg by mouth at bedtime. Anti-seizure medication. Consult needed.    . TRAZODONE HCL 100 MG PO TABS Oral Take 100 mg by mouth at bedtime.      [name] CLONAZEPAM 1 MG PO TABS Oral Take 1 tablet (1 mg total) by mouth 2 (two) times daily as needed for anxiety. 14 tablet 0  . NAPROXEN 500 MG PO TABS Oral Take 1 tablet (500 mg total) by mouth 2 (two) times daily with a meal. 30 tablet 0  . PROMETHAZINE HCL 25 MG PO TABS Oral Take 1 tablet (25 mg total) by mouth every 6 (six) hours as needed for nausea. 12 tablet 0    BP 93/51  Pulse 122  Temp 97.9 F (36.6 C) (Oral)  Resp 16  SpO2 98%  Physical Exam  Nursing note and vitals reviewed. Constitutional: She is oriented to person, place, and time. She appears well-developed and  well-nourished.  HENT:  Head: Normocephalic and atraumatic.  Eyes: EOM are normal. Pupils are equal, round, and reactive to light.  Neck: Normal range of motion. Neck supple.  Cardiovascular: Normal rate, normal heart sounds and intact distal pulses.   Pulmonary/Chest: Effort normal and breath sounds normal.  Abdominal: Bowel sounds are normal. She exhibits no distension. There is no tenderness.  Musculoskeletal: Normal range of motion. She exhibits no edema and no tenderness.  Neurological: She is alert and oriented to person, place, and time. She has normal strength. She displays normal reflexes. No cranial nerve deficit or sensory deficit. Coordination normal.  Skin: Skin is warm and dry. No rash noted.  Psychiatric: She has a normal mood and affect.    ED Course  Procedures (including critical care time)  Labs Reviewed  URINALYSIS, ROUTINE W REFLEX MICROSCOPIC - Abnormal; Notable for the following:    APPearance CLOUDY (*)     Hgb urine dipstick SMALL (*)      Leukocytes, UA LARGE (*)     All other components within normal limits  BASIC METABOLIC PANEL - Abnormal; Notable for the following:    Potassium 3.2 (*)     GFR calc non Af Amer 64 (*)     GFR calc Af Amer 74 (*)     All other components within normal limits  URINE MICROSCOPIC-ADD ON - Abnormal; Notable for the following:    Squamous Epithelial / LPF FEW (*)     Bacteria, UA FEW (*)     All other components within normal limits  CBC WITH DIFFERENTIAL  URINE CULTURE   No results found.   1. Dehydration   2. Postconcussion syndrome   3. UTI (lower urinary tract infection)       MDM  Pt is tachycardic, mildly hypotensive with standing. Will check basic labs and give IVF. She admits to poor PO fluid intake and when she does drink fluids it is caffeinated soda.    11:09 PM HR improved. UA shows continued evidence of UTI. Will send for culture, switch to Bactrim. Advised PCP followup. Advised to drink plenty of fluids. Given instructions regarding post-concussive syndrome.     Raybon Conard B. Bernette Mayers, MD 10/05/11 2310

## 2011-10-05 NOTE — ED Notes (Signed)
States fell and hit her head last week.  Was seen at [facility] and had CT.  Was told she had a concussion.  States headache has not improved and now having blurred vision.  Admits to nausea without vomiting.

## 2011-10-07 LAB — URINE CULTURE

## 2011-11-23 ENCOUNTER — Emergency Department (HOSPITAL_BASED_OUTPATIENT_CLINIC_OR_DEPARTMENT_OTHER)
Admission: EM | Admit: 2011-11-23 | Discharge: 2011-11-23 | Disposition: A | Discharge status: Home / Self Care | Payer: Self-pay | Attending: Emergency Medicine | Admitting: Emergency Medicine

## 2011-11-23 ENCOUNTER — Emergency Department (HOSPITAL_BASED_OUTPATIENT_CLINIC_OR_DEPARTMENT_OTHER): Payer: Self-pay

## 2011-11-23 ENCOUNTER — Encounter (HOSPITAL_BASED_OUTPATIENT_CLINIC_OR_DEPARTMENT_OTHER): Payer: Self-pay | Admitting: Family Medicine

## 2011-11-23 DIAGNOSIS — F319 Bipolar disorder, unspecified: Secondary | ICD-10-CM | POA: Insufficient documentation

## 2011-11-23 DIAGNOSIS — Z79899 Other long term (current) drug therapy: Secondary | ICD-10-CM | POA: Insufficient documentation

## 2011-11-23 DIAGNOSIS — Z7982 Long term (current) use of aspirin: Secondary | ICD-10-CM | POA: Insufficient documentation

## 2011-11-23 DIAGNOSIS — R109 Unspecified abdominal pain: Secondary | ICD-10-CM | POA: Insufficient documentation

## 2011-11-23 DIAGNOSIS — J45909 Unspecified asthma, uncomplicated: Secondary | ICD-10-CM | POA: Insufficient documentation

## 2011-11-23 DIAGNOSIS — Z975 Presence of (intrauterine) contraceptive device: Secondary | ICD-10-CM | POA: Insufficient documentation

## 2011-11-23 LAB — URINALYSIS, ROUTINE W REFLEX MICROSCOPIC
Glucose, UA: NEGATIVE mg/dL
Specific Gravity, Urine: 1.02 (ref 1.005–1.030)
pH: 5.5 (ref 5.0–8.0)

## 2011-11-23 LAB — PREGNANCY, URINE: Preg Test, Ur: NEGATIVE

## 2011-11-23 LAB — URINE MICROSCOPIC-ADD ON

## 2011-11-23 MED ORDER — KETOROLAC TROMETHAMINE 60 MG/2ML IM SOLN
60.0000 mg | Freq: Once | INTRAMUSCULAR | Status: AC
  Administered 2011-11-23: 60 mg via INTRAMUSCULAR
  Filled 2011-11-23: qty 2

## 2011-11-23 MED ORDER — OXYCODONE-ACETAMINOPHEN 5-325 MG PO TABS
1.0000 | ORAL_TABLET | Freq: Once | ORAL | Status: AC
  Administered 2011-11-23: 1 via ORAL
  Filled 2011-11-23 (×3): qty 1

## 2011-11-23 MED ORDER — SULFAMETHOXAZOLE-TRIMETHOPRIM 800-160 MG PO TABS: 1.0000 | ORAL_TABLET | Freq: Two times a day (BID) | ORAL | Status: AC

## 2011-11-23 MED ORDER — ACETAMINOPHEN-CODEINE #3 300-30 MG PO TABS: 1.0000 | ORAL_TABLET | Freq: Four times a day (QID) | ORAL | Status: AC | PRN

## 2011-11-23 NOTE — ED Notes (Signed)
Pt c/o vaginal bleeding, abdominal cramping and low back pain x 2 days. Pt sts she was here 2 days ago and diagnosed with uti and is taking cipro, pyridium and tylenol 3 for same.

## 2011-11-23 NOTE — ED Provider Notes (Signed)
History     CSN: [id number]  Arrival date & time 11/23/11  1246   First MD Initiated Contact with Patient 11/23/11 1337      Chief Complaint  Patient presents with  . Vaginal Bleeding  . Abdominal Cramping    (Consider location/radiation/quality/duration/timing/severity/associated sxs/prior treatment) The history is provided by the patient.  Stacey Weaver is a 36 y.o. female hx of migraines, asthma, bipolar here with abdominal cramps and vaginal bleeding. She had IUD for several years. Today, she suddenly had a gush of bleeding from her vagina then had lower abdominal cramps. She is on cipro and pyridium for UTI but still has dysuria and frequency. No vaginal discharge or fevers. Has some HA that is more mild compared to her migraines.    Past Medical History  Diagnosis Date  . Bipolar 1 disorder   . Asthma   . Migraine   . Anxiety     Past Surgical History  Procedure Date  . Knee surgery     No family history on file.  History  Substance Use Topics  . Smoking status: Never Smoker   . Smokeless tobacco: Not on file  . Alcohol Use: No    OB History    Grav Para Term Preterm Abortions TAB SAB Ect Mult Living                  Review of Systems  Gastrointestinal: Positive for abdominal pain.  Genitourinary: Positive for dysuria.  Neurological: Positive for headaches.  All other systems reviewed and are negative.    Allergies  Almotriptan malate; Zomig; Cymbalta; and Imitrex  Home Medications   Current Outpatient Rx  Name Route Sig Dispense Refill  . ACETAMINOPHEN-CODEINE #3 300-30 MG PO TABS Oral Take 1 tablet by mouth every 4 (four) hours as needed.    [name] CIPROFLOXACIN HCL 500 MG PO TABS Oral Take 500 mg by mouth 2 (two) times daily.    [name] PHENAZOPYRIDINE HCL 100 MG PO TABS Oral Take 100 mg by mouth 3 (three) times daily as needed.    . ARIPIPRAZOLE 5 MG PO TABS Oral Take 5 mg by mouth at bedtime. Patient uses this medication to aid with sleep.    .  ASPIRIN-ACETAMINOPHEN-CAFFEINE 250-250-65 MG PO TABS Oral Take 2 tablets by mouth every 6 (six) hours as needed. For pain    . CALCIUM CARBONATE ANTACID 500 MG PO CHEW Oral Chew 1 tablet by mouth daily.    [name] CLONAZEPAM 1 MG PO TABS Oral Take 1 mg by mouth daily as needed. For anxiety     . CLONAZEPAM 1 MG PO TABS Oral Take 1 tablet (1 mg total) by mouth 2 (two) times daily as needed for anxiety. 14 tablet 0  . FLUOXETINE HCL 20 MG PO TABS Oral Take 20 mg by mouth daily.    . IBUPROFEN 200 MG PO TABS Oral Take 800 mg by mouth every 6 (six) hours as needed. For pain relief    . LAMOTRIGINE 25 MG PO TABS Oral Take 25 mg by mouth at bedtime. Anti-seizure medication. Consult needed.    [name] NAPROXEN 500 MG PO TABS Oral Take 1 tablet (500 mg total) by mouth 2 (two) times daily with a meal. 30 tablet 0  . PROMETHAZINE HCL 25 MG PO TABS Oral Take 1 tablet (25 mg total) by mouth every 6 (six) hours as needed for nausea. 12 tablet 0  . TRAZODONE HCL 100 MG PO TABS Oral Take 100 mg by  mouth at bedtime.        BP 111/74  Pulse 110  Temp 97.6 F (36.4 C) (Oral)  Resp 18  Ht 5\' 4"  (1.626 m)  Wt 170 lb (77.111 kg)  BMI 29.18 kg/m2  SpO2 97%  Physical Exam  Nursing note and vitals reviewed. Constitutional: She is oriented to person, place, and time.       Uncomfortable  HENT:  Head: Normocephalic.  Mouth/Throat: Oropharynx is clear and moist.  Eyes: Conjunctivae are normal. Pupils are equal, round, and reactive to light.  Neck: Normal range of motion. Neck supple.  Cardiovascular: Normal rate, regular rhythm and normal heart sounds.   Pulmonary/Chest: Effort normal and breath sounds normal.  Abdominal:       + diffuse lower abdominal tenderness, no rebound.   Genitourinary:       Minimal blood in vault. No CMT, + diffuse uterine and bilateral ovarian tenderness. IUD in place.   Musculoskeletal: Normal range of motion.  Neurological: She is alert and oriented to person, place, and time.  Skin:  Skin is warm and dry.  Psychiatric: She has a normal mood and affect. Her behavior is normal. Judgment and thought content normal.    ED Course  Procedures (including critical care time)  Labs Reviewed  URINALYSIS, ROUTINE W REFLEX MICROSCOPIC - Abnormal; Notable for the following:    Color, Urine AMBER (*)  BIOCHEMICALS MAY BE AFFECTED BY COLOR   APPearance CLOUDY (*)     Hgb urine dipstick LARGE (*)     Leukocytes, UA LARGE (*)     All other components within normal limits  URINE MICROSCOPIC-ADD ON - Abnormal; Notable for the following:    Squamous Epithelial / LPF FEW (*)     Bacteria, UA MANY (*)     All other components within normal limits  PREGNANCY, URINE  URINE CULTURE   US Transvaginal Non-ob  11/23/2011  *RADIOLOGY REPORT*  Clinical Data:  Bilateral adnexal tenderness, evaluate for torsion, vaginal bleeding, IUD  TRANSABDOMINAL AND TRANSVAGINAL ULTRASOUND OF PELVIS DOPPLER ULTRASOUND OF OVARIES  Technique:  Both transabdominal and transvaginal ultrasound examinations of the pelvis were performed. Transabdominal technique was performed for global imaging of the pelvis including uterus, ovaries, adnexal regions, and pelvic cul-de-sac.  It was necessary to proceed with endovaginal exam following the transabdominal exam to visualize the bilateral ovaries.  Color and duplex Doppler ultrasound was utilized to evaluate blood flow to the ovaries.  Comparison:  None.  Findings:  Uterus:  Normal in size and appearance, measuring 6.7 x 2.4 x 3.6 cm.  Endometrium:  Normal in thickness, measuring 3 mm.  IUD in satisfactory position.  Small amount of fluid in the lower uterine segment  Right ovary: Normal appearance/no adnexal mass, measuring 2.9 x 1.6 x 1.7 cm.  Left ovary:   Normal appearance/no adnexal mass, measuring 2.6 x 1.8 x 2.1 cm.  Pulsed Doppler evaluation demonstrates normal low-resistance arterial and venous waveforms in both ovaries.  IMPRESSION: Normal exam.  No evidence of pelvic  mass or other significant abnormality.  No sonographic evidence for ovarian torsion.  IUD in satisfactory position.   Original Report Authenticated By: Charline Bills, M.D.    US Pelvis Limited  11/23/2011  *RADIOLOGY REPORT*  Clinical Data:  Bilateral adnexal tenderness, evaluate for torsion, vaginal bleeding, IUD  TRANSABDOMINAL AND TRANSVAGINAL ULTRASOUND OF PELVIS DOPPLER ULTRASOUND OF OVARIES  Technique:  Both transabdominal and transvaginal ultrasound examinations of the pelvis were performed. Transabdominal technique was performed for global  imaging of the pelvis including uterus, ovaries, adnexal regions, and pelvic cul-de-sac.  It was necessary to proceed with endovaginal exam following the transabdominal exam to visualize the bilateral ovaries.  Color and duplex Doppler ultrasound was utilized to evaluate blood flow to the ovaries.  Comparison:  None.  Findings:  Uterus:  Normal in size and appearance, measuring 6.7 x 2.4 x 3.6 cm.  Endometrium:  Normal in thickness, measuring 3 mm.  IUD in satisfactory position.  Small amount of fluid in the lower uterine segment  Right ovary: Normal appearance/no adnexal mass, measuring 2.9 x 1.6 x 1.7 cm.  Left ovary:   Normal appearance/no adnexal mass, measuring 2.6 x 1.8 x 2.1 cm.  Pulsed Doppler evaluation demonstrates normal low-resistance arterial and venous waveforms in both ovaries.  IMPRESSION: Normal exam.  No evidence of pelvic mass or other significant abnormality.  No sonographic evidence for ovarian torsion.  IUD in satisfactory position.   Original Report Authenticated By: Charline Bills, M.D.    [place] Art/ven Flow Abd Pelv Doppler  11/23/2011  *RADIOLOGY REPORT*  Clinical Data:  Bilateral adnexal tenderness, evaluate for torsion, vaginal bleeding, IUD  TRANSABDOMINAL AND TRANSVAGINAL ULTRASOUND OF PELVIS DOPPLER ULTRASOUND OF OVARIES  Technique:  Both transabdominal and transvaginal ultrasound examinations of the pelvis were performed.  Transabdominal technique was performed for global imaging of the pelvis including uterus, ovaries, adnexal regions, and pelvic cul-de-sac.  It was necessary to proceed with endovaginal exam following the transabdominal exam to visualize the bilateral ovaries.  Color and duplex Doppler ultrasound was utilized to evaluate blood flow to the ovaries.  Comparison:  None.  Findings:  Uterus:  Normal in size and appearance, measuring 6.7 x 2.4 x 3.6 cm.  Endometrium:  Normal in thickness, measuring 3 mm.  IUD in satisfactory position.  Small amount of fluid in the lower uterine segment  Right ovary: Normal appearance/no adnexal mass, measuring 2.9 x 1.6 x 1.7 cm.  Left ovary:   Normal appearance/no adnexal mass, measuring 2.6 x 1.8 x 2.1 cm.  Pulsed Doppler evaluation demonstrates normal low-resistance arterial and venous waveforms in both ovaries.  IMPRESSION: Normal exam.  No evidence of pelvic mass or other significant abnormality.  No sonographic evidence for ovarian torsion.  IUD in satisfactory position.   Original Report Authenticated By: Charline Bills, M.D.      1. Abdominal pain       MDM  Stacey Weaver is a 36 y.o. female here with dysuria and vaginal bleeding. Will check pregnancy test, UA, urine culture, transvag US.   3:22 PM US transvag showed no torsion, IUD in good position. Patient feels slightly better. UA showed +UTI, UCG neg. Will give bactrim and will call patient if GC/Chlamydia is positive or U culture showed resistant organisms.        Richardean Canal, MD 11/23/11 [phone]

## 2011-11-24 LAB — URINE CULTURE: Special Requests: NORMAL

## 2011-11-24 LAB — GC/CHLAMYDIA PROBE AMP, GENITAL: GC Probe Amp, Genital: NEGATIVE

## 2011-11-28 ENCOUNTER — Encounter (HOSPITAL_BASED_OUTPATIENT_CLINIC_OR_DEPARTMENT_OTHER): Payer: Self-pay | Admitting: Emergency Medicine

## 2011-11-28 ENCOUNTER — Emergency Department (HOSPITAL_BASED_OUTPATIENT_CLINIC_OR_DEPARTMENT_OTHER)
Admission: EM | Admit: 2011-11-28 | Discharge: 2011-11-28 | Disposition: A | Discharge status: Home / Self Care | Payer: Self-pay | Attending: Emergency Medicine | Admitting: Emergency Medicine

## 2011-11-28 DIAGNOSIS — Z79899 Other long term (current) drug therapy: Secondary | ICD-10-CM | POA: Insufficient documentation

## 2011-11-28 DIAGNOSIS — J45909 Unspecified asthma, uncomplicated: Secondary | ICD-10-CM | POA: Insufficient documentation

## 2011-11-28 DIAGNOSIS — F419 Anxiety disorder, unspecified: Secondary | ICD-10-CM

## 2011-11-28 DIAGNOSIS — F411 Generalized anxiety disorder: Secondary | ICD-10-CM | POA: Insufficient documentation

## 2011-11-28 DIAGNOSIS — F319 Bipolar disorder, unspecified: Secondary | ICD-10-CM | POA: Insufficient documentation

## 2011-11-28 MED ORDER — LORAZEPAM 1 MG PO TABS: 1.0000 mg | ORAL_TABLET | Freq: Three times a day (TID) | ORAL | Status: AC | PRN

## 2011-11-28 NOTE — ED Provider Notes (Signed)
History     CSN: [id number]  Arrival date & time 11/28/11  1159   First MD Initiated Contact with Patient 11/28/11 1308      Chief Complaint  Patient presents with  . Insomnia  . Manic Behavior    (Consider location/radiation/quality/duration/timing/severity/associated sxs/prior treatment) HPI Comments: Patient presents with anxiety and insomnia. She states that she hasn't been able to sleep for the last 3 days. She's having difficulty concentrating and feels like her mind is racing. She does have a history of bipolar disorder and has had manic episodes in the past. She states however that she's never been a week for this long of a stretch in the past. She denies any suicide or homicidal ideations. She's having some occasional auditory hallucinations. She denies any recent changes to her medications. She does take Klonopin at home but states she says that she is almost out of it and hasn't been making her sleep. She tried over-the-counter sleep aids without relief. She did not feel that she needs to be admitted to a psychiatric hospital she is just requesting something to help her sleep. She denies any physical complaints.   Past Medical History  Diagnosis Date  . Bipolar 1 disorder   . Asthma   . Migraine   . Anxiety     Past Surgical History  Procedure Date  . Knee surgery     No family history on file.  History  Substance Use Topics  . Smoking status: Never Smoker   . Smokeless tobacco: Not on file  . Alcohol Use: No    OB History    Grav Para Term Preterm Abortions TAB SAB Ect Mult Living                  Review of Systems  Constitutional: Negative for fever, chills, diaphoresis and fatigue.  HENT: Negative for congestion, rhinorrhea and sneezing.   Eyes: Negative.   Respiratory: Negative for cough, chest tightness and shortness of breath.   Cardiovascular: Negative for chest pain and leg swelling.  Gastrointestinal: Negative for nausea, vomiting, abdominal  pain, diarrhea and blood in stool.  Genitourinary: Negative for frequency, hematuria, flank pain and difficulty urinating.  Musculoskeletal: Negative for back pain and arthralgias.  Skin: Negative for rash.  Neurological: Negative for dizziness, speech difficulty, weakness, numbness and headaches.  Psychiatric/Behavioral: Positive for hallucinations, disturbed wake/sleep cycle, decreased concentration and agitation. Negative for suicidal ideas and self-injury. The patient is nervous/anxious.     Allergies  Almotriptan malate; Zomig; Cymbalta; and Imitrex  Home Medications   Current Outpatient Rx  Name Route Sig Dispense Refill  . ACETAMINOPHEN-CODEINE #3 300-30 MG PO TABS Oral Take 1-2 tablets by mouth every 6 (six) hours as needed for pain. 15 tablet 0  . ARIPIPRAZOLE 5 MG PO TABS Oral Take 5 mg by mouth at bedtime. Patient uses this medication to aid with sleep.    . ASPIRIN-ACETAMINOPHEN-CAFFEINE 250-250-65 MG PO TABS Oral Take 2 tablets by mouth every 6 (six) hours as needed. For pain    . CALCIUM CARBONATE ANTACID 500 MG PO CHEW Oral Chew 1 tablet by mouth daily.    [name] FLUOXETINE HCL 20 MG PO TABS Oral Take 20 mg by mouth daily.    . IBUPROFEN 200 MG PO TABS Oral Take 800 mg by mouth every 6 (six) hours as needed. For pain relief    . LAMOTRIGINE 25 MG PO TABS Oral Take 25 mg by mouth at bedtime. Anti-seizure medication. Consult needed.    [name]  SULFAMETHOXAZOLE-TRIMETHOPRIM 800-160 MG PO TABS Oral Take 1 tablet by mouth 2 (two) times daily. 14 tablet 0  . TRAZODONE HCL 100 MG PO TABS Oral Take 100 mg by mouth at bedtime.      [name] CLONAZEPAM 1 MG PO TABS Oral Take 1 mg by mouth daily as needed. For anxiety     . LORAZEPAM 1 MG PO TABS Oral Take 1 tablet (1 mg total) by mouth 3 (three) times daily as needed for anxiety. 10 tablet 0  . PHENAZOPYRIDINE HCL 100 MG PO TABS Oral Take 100 mg by mouth 3 (three) times daily as needed.    [name] PROMETHAZINE HCL 25 MG PO TABS Oral Take 1 tablet (25 mg  total) by mouth every 6 (six) hours as needed for nausea. 12 tablet 0    BP 101/61  Pulse 102  Temp 98.3 F (36.8 C) (Oral)  Resp 20  Ht 5\' 4"  (1.626 m)  Wt 170 lb (77.111 kg)  BMI 29.18 kg/m2  SpO2 99%  Physical Exam  Constitutional: She is oriented to person, place, and time. She appears well-developed and well-nourished.  HENT:  Head: Normocephalic and atraumatic.  Eyes: Pupils are equal, round, and reactive to light.  Neck: Normal range of motion. Neck supple.  Cardiovascular: Normal rate, regular rhythm and normal heart sounds.   Pulmonary/Chest: Effort normal and breath sounds normal. No respiratory distress. She has no wheezes. She has no rales. She exhibits no tenderness.  Abdominal: Soft. Bowel sounds are normal. There is no tenderness. There is no rebound and no guarding.  Musculoskeletal: Normal range of motion. She exhibits no edema.  Lymphadenopathy:    She has no cervical adenopathy.  Neurological: She is alert and oriented to person, place, and time.  Skin: Skin is warm and dry. No rash noted.  Psychiatric: She has a normal mood and affect. Her behavior is normal. Thought content normal.    ED Course  Procedures (including critical care time)  Labs Reviewed - No data to display No results found.   1. Anxiety       MDM  Patient appears to be acting appropriately. She is denying any suicidal ideations. She did not appear to be a danger to herself. I will give her prescription for Ativan to try. I gave her prescription for 10 tablets. I by shirts this is not helping she is to followup with her psychiatrist. Advised to return here as needed for any worsening symptoms.        Rolan Bucco, MD 11/28/11 1400

## 2011-11-28 NOTE — ED Notes (Signed)
Pt states she has been having insomnia for three days.  Not eating, difficulty with thought processes.  Similar to manic behavior in past but not as severe.

## 2012-01-07 ENCOUNTER — Encounter (HOSPITAL_BASED_OUTPATIENT_CLINIC_OR_DEPARTMENT_OTHER): Payer: Self-pay | Admitting: *Deleted

## 2012-01-07 ENCOUNTER — Emergency Department (HOSPITAL_BASED_OUTPATIENT_CLINIC_OR_DEPARTMENT_OTHER)
Admission: EM | Admit: 2012-01-07 | Discharge: 2012-01-07 | Disposition: A | Discharge status: Home / Self Care | Payer: Self-pay | Attending: Emergency Medicine | Admitting: Emergency Medicine

## 2012-01-07 DIAGNOSIS — F411 Generalized anxiety disorder: Secondary | ICD-10-CM | POA: Insufficient documentation

## 2012-01-07 DIAGNOSIS — J45909 Unspecified asthma, uncomplicated: Secondary | ICD-10-CM | POA: Insufficient documentation

## 2012-01-07 DIAGNOSIS — F319 Bipolar disorder, unspecified: Secondary | ICD-10-CM | POA: Insufficient documentation

## 2012-01-07 DIAGNOSIS — N390 Urinary tract infection, site not specified: Secondary | ICD-10-CM | POA: Insufficient documentation

## 2012-01-07 LAB — URINALYSIS, ROUTINE W REFLEX MICROSCOPIC
Ketones, ur: NEGATIVE mg/dL
Protein, ur: NEGATIVE mg/dL
Urobilinogen, UA: 0.2 mg/dL (ref 0.0–1.0)

## 2012-01-07 LAB — URINE MICROSCOPIC-ADD ON

## 2012-01-07 LAB — PREGNANCY, URINE: Preg Test, Ur: NEGATIVE

## 2012-01-07 MED ORDER — OXYCODONE-ACETAMINOPHEN 5-325 MG PO TABS: 2.0000 | ORAL_TABLET | ORAL | Status: DC | PRN

## 2012-01-07 MED ORDER — PHENAZOPYRIDINE HCL 200 MG PO TABS: 200.0000 mg | ORAL_TABLET | Freq: Three times a day (TID) | ORAL | Status: DC

## 2012-01-07 MED ORDER — SULFAMETHOXAZOLE-TRIMETHOPRIM 800-160 MG PO TABS: 1.0000 | ORAL_TABLET | Freq: Two times a day (BID) | ORAL | Status: DC

## 2012-01-07 NOTE — ED Provider Notes (Signed)
History     CSN: [id number]  Arrival date & time 01/07/12  1216   First MD Initiated Contact with Patient 01/07/12 1248      Chief Complaint  Patient presents with  . Urinary Tract Infection    (Consider location/radiation/quality/duration/timing/severity/associated sxs/prior treatment) HPI Comments: Patient is a 36 year old healthy female who presents with urinary frequency, urgency, dysuria that started yesterday. The patient reports symptoms every time she urinates since yesterday. She has also noticed cloudy urine when she urinates. She denies alleviating/aggravating factors. She has not tried anything to make the discomfort better. She reports associated low back pain and general malaise that started today. She denies fever, NVD, menstrual irregularities, hematuria.    Past Medical History  Diagnosis Date  . Bipolar 1 disorder   . Asthma   . Migraine   . Anxiety     Past Surgical History  Procedure Date  . Knee surgery     No family history on file.  History  Substance Use Topics  . Smoking status: Never Smoker   . Smokeless tobacco: Not on file  . Alcohol Use: No    OB History    Grav Para Term Preterm Abortions TAB SAB Ect Mult Living                  Review of Systems  Genitourinary: Positive for dysuria, urgency and frequency.  Musculoskeletal: Positive for back pain.  All other systems reviewed and are negative.    Allergies  Almotriptan malate; Zomig; Cymbalta; and Imitrex  Home Medications   Current Outpatient Rx  Name Route Sig Dispense Refill  . ARIPIPRAZOLE 5 MG PO TABS Oral Take 5 mg by mouth at bedtime. Patient uses this medication to aid with sleep.    . ASPIRIN-ACETAMINOPHEN-CAFFEINE 250-250-65 MG PO TABS Oral Take 2 tablets by mouth every 6 (six) hours as needed. For pain    . CALCIUM CARBONATE ANTACID 500 MG PO CHEW Oral Chew 1 tablet by mouth daily.    [name] CLONAZEPAM 1 MG PO TABS Oral Take 1 mg by mouth daily as needed. For anxiety      . FLUOXETINE HCL 20 MG PO TABS Oral Take 20 mg by mouth daily.    . IBUPROFEN 200 MG PO TABS Oral Take 800 mg by mouth every 6 (six) hours as needed. For pain relief    . LAMOTRIGINE 25 MG PO TABS Oral Take 25 mg by mouth at bedtime. Anti-seizure medication. Consult needed.    [name] PHENAZOPYRIDINE HCL 100 MG PO TABS Oral Take 100 mg by mouth 3 (three) times daily as needed.    [name] PROMETHAZINE HCL 25 MG PO TABS Oral Take 1 tablet (25 mg total) by mouth every 6 (six) hours as needed for nausea. 12 tablet 0  . TRAZODONE HCL 100 MG PO TABS Oral Take 100 mg by mouth at bedtime.        BP 109/64  Pulse 91  Temp 98.1 F (36.7 C) (Oral)  Resp 20  SpO2 99%  Physical Exam  Nursing note and vitals reviewed. Constitutional: She is oriented to person, place, and time. She appears well-developed and well-nourished. No distress.  HENT:  Head: Normocephalic and atraumatic.  Eyes: Conjunctivae normal are normal.  Neck: Normal range of motion. Neck supple.  Cardiovascular: Normal rate and regular rhythm.  Exam reveals no gallop and no friction rub.   No murmur heard. Pulmonary/Chest: Effort normal and breath sounds normal. She has no wheezes. She has  no rales. She exhibits no tenderness.  Abdominal: Soft. She exhibits no distension. There is tenderness. There is no rebound.       Suprapubic tenderness to deep palpation. No abdominal tenderness elsewhere noted.   Musculoskeletal: Normal range of motion.       No back or midline tenderness to palpation.   Neurological: She is alert and oriented to person, place, and time. Coordination normal.       Speech is goal-oriented. Moves limbs without ataxia.   Skin: Skin is warm and dry. She is not diaphoretic.  Psychiatric: She has a normal mood and affect. Her behavior is normal.    ED Course  Procedures (including critical care time)  Labs Reviewed  URINALYSIS, ROUTINE W REFLEX MICROSCOPIC - Abnormal; Notable for the following:    APPearance CLOUDY (*)      Leukocytes, UA LARGE (*)     All other components within normal limits  URINE MICROSCOPIC-ADD ON - Abnormal; Notable for the following:    Squamous Epithelial / LPF FEW (*)     Bacteria, UA MANY (*)     All other components within normal limits  PREGNANCY, URINE   No results found.   1. UTI (urinary tract infection)       MDM  1:05 PM Patient's history reveals suprapubic discomfort, frequency, urgency and dysuria. Urinalysis shows UTI. I will treat her with Bactrim and Pyridium and Percocet for pain. I will refer her to Urology for frequent UTI's. No further evaluation needed at this time.         Emilia Beck, PA-C 01/07/12 1332

## 2012-01-07 NOTE — ED Notes (Signed)
Dysuria, frequency and scanty urine output x 2 days. Lower back pain.

## 2012-01-07 NOTE — ED Provider Notes (Signed)
Medical screening examination/treatment/procedure(s) were performed by non-physician practitioner and as supervising physician I was immediately available for consultation/collaboration.   Charles B. Bernette Mayers, MD 01/07/12 1409

## 2012-01-09 ENCOUNTER — Emergency Department (HOSPITAL_COMMUNITY)
Admission: EM | Admit: 2012-01-09 | Discharge: 2012-01-09 | Disposition: A | Discharge status: Home / Self Care | Payer: PRIVATE HEALTH INSURANCE | Attending: Emergency Medicine | Admitting: Emergency Medicine

## 2012-01-09 ENCOUNTER — Emergency Department (HOSPITAL_COMMUNITY): Payer: PRIVATE HEALTH INSURANCE

## 2012-01-09 DIAGNOSIS — R51 Headache: Secondary | ICD-10-CM | POA: Insufficient documentation

## 2012-01-09 DIAGNOSIS — R55 Syncope and collapse: Secondary | ICD-10-CM

## 2012-01-09 DIAGNOSIS — F411 Generalized anxiety disorder: Secondary | ICD-10-CM | POA: Insufficient documentation

## 2012-01-09 DIAGNOSIS — J45909 Unspecified asthma, uncomplicated: Secondary | ICD-10-CM | POA: Insufficient documentation

## 2012-01-09 DIAGNOSIS — Z79899 Other long term (current) drug therapy: Secondary | ICD-10-CM | POA: Insufficient documentation

## 2012-01-09 DIAGNOSIS — S069X9A Unspecified intracranial injury with loss of consciousness of unspecified duration, initial encounter: Secondary | ICD-10-CM

## 2012-01-09 DIAGNOSIS — S0990XA Unspecified injury of head, initial encounter: Secondary | ICD-10-CM | POA: Insufficient documentation

## 2012-01-09 DIAGNOSIS — M542 Cervicalgia: Secondary | ICD-10-CM | POA: Insufficient documentation

## 2012-01-09 DIAGNOSIS — R296 Repeated falls: Secondary | ICD-10-CM | POA: Insufficient documentation

## 2012-01-09 DIAGNOSIS — F319 Bipolar disorder, unspecified: Secondary | ICD-10-CM | POA: Insufficient documentation

## 2012-01-09 LAB — URINE CULTURE: Special Requests: NORMAL

## 2012-01-09 LAB — POCT I-STAT, CHEM 8
BUN: 7 mg/dL (ref 6–23)
Chloride: 107 mEq/L (ref 96–112)
Potassium: 4.1 mEq/L (ref 3.5–5.1)
Sodium: 142 mEq/L (ref 135–145)

## 2012-01-09 LAB — POCT PREGNANCY, URINE: Preg Test, Ur: NEGATIVE

## 2012-01-09 MED ORDER — OXYCODONE-ACETAMINOPHEN 5-325 MG PO TABS: 2.0000 | ORAL_TABLET | ORAL | Status: DC | PRN

## 2012-01-09 MED ORDER — ONDANSETRON 8 MG PO TBDP: ORAL_TABLET | ORAL | Status: DC

## 2012-01-09 MED ORDER — METOCLOPRAMIDE HCL 10 MG PO TABS: 10.0000 mg | ORAL_TABLET | Freq: Four times a day (QID) | ORAL | Status: DC | PRN

## 2012-01-09 MED ORDER — FENTANYL CITRATE 0.05 MG/ML IJ SOLN
100.0000 ug | Freq: Once | INTRAMUSCULAR | Status: DC
  Filled 2012-01-09: qty 2

## 2012-01-09 MED ORDER — ONDANSETRON HCL 4 MG/2ML IJ SOLN
4.0000 mg | Freq: Once | INTRAMUSCULAR | Status: AC
  Administered 2012-01-09: 4 mg via INTRAVENOUS
  Filled 2012-01-09: qty 2

## 2012-01-09 MED ORDER — FENTANYL CITRATE 0.05 MG/ML IJ SOLN
50.0000 ug | Freq: Once | INTRAMUSCULAR | Status: AC
  Administered 2012-01-09: 50 ug via INTRAVENOUS

## 2012-01-09 NOTE — ED Notes (Signed)
Per [facility], pt reports that she lost consciousness and fell and hit her head. Pt complains of pain on the right side of her head as well as neck pain, and pain in the right shoulder. ROM intact. Currently in immobilizer. No observed lacerations, abrasions, or other injuries. Pt reports being treated at [facility] on 01/07/12 for a UTI. Pt has hx of bipolar, anxiety.

## 2012-01-09 NOTE — ED Provider Notes (Signed)
History     CSN: [id number]  Arrival date & time 01/09/12  1805   First MD Initiated Contact with Patient 01/09/12 1820      Chief Complaint  Patient presents with  . Loss of Consciousness  . Fall    (Consider location/radiation/quality/duration/timing/severity/associated sxs/prior treatment) HPI This 36 year old female was seen at [facility] a couple days ago for urinary tract infection. At baseline she has a history of asthma and bipolar with anxiety. She states that today she was feeling fine and was walking towards her bedroom when she began feeling lightheaded like she might faint she tried to make her bed but did not make it in time had a brief episode of witnessed syncope and hit the back of her head when she fell. She now complains of a headache and some right-sided paracervical soft tissue pain. She is no midline neck pain. She did not have a headache before she hit her head. She is no chest pain palpitation shortness breath abdominal pain vomiting bloody stools. She is no change in speech or vision swallowing or understanding. She is no focal or lateralizing weakness or numbness. There is no treatment prior to arrival other than c-collar and backboard from EMS. She had a witnessed brief syncopal spell lasting less than a minute likely according to the patient. She is anxious but not a threat to hurt herself or others. Past Medical History  Diagnosis Date  . Bipolar 1 disorder   . Asthma   . Migraine   . Anxiety     Past Surgical History  Procedure Date  . Knee surgery     No family history on file.  History  Substance Use Topics  . Smoking status: Never Smoker   . Smokeless tobacco: Not on file  . Alcohol Use: No    OB History    Grav Para Term Preterm Abortions TAB SAB Ect Mult Living                  Review of Systems 10 Systems reviewed and are negative for acute change except as noted in the HPI. Allergies  Almotriptan malate; Zomig;  Cymbalta; and Imitrex  Home Medications   Current Outpatient Rx  Name Route Sig Dispense Refill  . ALBUTEROL SULFATE HFA 108 (90 BASE) MCG/ACT IN AERS Inhalation Inhale 2 puffs into the lungs every 6 (six) hours as needed. Shortness of breath    . ARIPIPRAZOLE 5 MG PO TABS Oral Take 5 mg by mouth at bedtime. Patient uses this medication to aid with sleep.    [name] CLONAZEPAM 1 MG PO TABS Oral Take 1 mg by mouth daily as needed. For anxiety    . FLUOXETINE HCL 20 MG PO TABS Oral Take 20 mg by mouth daily.    [name] LAMOTRIGINE 25 MG PO TABS Oral Take 25 mg by mouth at bedtime. Anti-seizure medication. Consult needed.    . OXYCODONE-ACETAMINOPHEN 5-325 MG PO TABS Oral Take 2 tablets by mouth every 4 (four) hours as needed.    [name] PHENAZOPYRIDINE HCL 100 MG PO TABS Oral Take 100 mg by mouth 3 (three) times daily as needed.    [name] PROMETHAZINE HCL 25 MG PO TABS Oral Take 25 mg by mouth every 6 (six) hours as needed. Nausea    . SULFAMETHOXAZOLE-TRIMETHOPRIM 800-160 MG PO TABS Oral Take 1 tablet by mouth every 12 (twelve) hours.    . TRAZODONE HCL 100 MG PO TABS Oral Take 100 mg by mouth at  bedtime.      [name] METOCLOPRAMIDE HCL 10 MG PO TABS Oral Take 1 tablet (10 mg total) by mouth every 6 (six) hours as needed (nausea/headache). 6 tablet 0  . ONDANSETRON 8 MG PO TBDP  8mg  ODT q4 hours prn nausea 4 tablet 0  . OXYCODONE-ACETAMINOPHEN 5-325 MG PO TABS Oral Take 2 tablets by mouth every 4 (four) hours as needed for pain. 20 tablet 0  . PROMETHAZINE HCL 25 MG PO TABS Oral Take 1 tablet (25 mg total) by mouth every 6 (six) hours as needed for nausea. 12 tablet 0    BP 101/54  Pulse 89  Temp 98.7 F (37.1 C) (Oral)  Resp 18  SpO2 98%  Physical Exam  Nursing note and vitals reviewed. Constitutional:       Awake, alert, nontoxic appearance with baseline speech for patient.  HENT:  Head: Atraumatic.  Mouth/Throat: No oropharyngeal exudate.  Eyes: EOM are normal. Pupils are equal, round, and reactive to  light. Right eye exhibits no discharge. Left eye exhibits no discharge.  Neck: Neck supple.       No midline C-S tenderness; mild right paracervical tenderness  Cardiovascular: Normal rate and regular rhythm.   No murmur heard. Pulmonary/Chest: Effort normal and breath sounds normal. No stridor. No respiratory distress. She has no wheezes. She has no rales. She exhibits no tenderness.  Abdominal: Soft. Bowel sounds are normal. She exhibits no mass. There is no tenderness. There is no rebound.  Musculoskeletal: She exhibits no tenderness.       Baseline ROM, moves extremities with no obvious new focal weakness.back NT  Lymphadenopathy:    She has no cervical adenopathy.  Neurological: She is alert.       Awake, alert, cooperative and aware of situation; motor strength bilaterally; sensation normal to light touch bilaterally; peripheral visual fields full to confrontation; no facial asymmetry; tongue midline; major cranial nerves appear intact; no pronator drift, normal finger to nose bilaterally  Skin: No rash noted.  Psychiatric:       anxious    ED Course  Procedures (including critical care time)  Labs Reviewed  POCT I-STAT, CHEM 8 - Abnormal; Notable for the following:    Creatinine, Ser 1.20 (*)     All other components within normal limits  POCT PREGNANCY, URINE  LAB REPORT - SCANNED  GLUCOSE, CAPILLARY   Ct Head Wo Contrast - If Head Trauma Is Known/suspected And/or Pt Is Taking Anticoagulant  01/09/2012  *RADIOLOGY REPORT*  Clinical Data: Loss of consciousness.  Fall with injury to the back the head.  CT HEAD WITHOUT CONTRAST  Technique:  Contiguous axial images were obtained from the base of the skull through the vertex without contrast.  Comparison: Head CT 09/29/2011.  Findings: No acute displaced skull fractures are identified.  No acute intracranial abnormality.  Specifically, no evidence of acute post-traumatic intracranial hemorrhage, no definite regions of acute/subacute  cerebral ischemia, no focal mass, mass effect, hydrocephalus or abnormal intra or extra-axial fluid collections. The visualized paranasal sinuses and mastoids are well pneumatized.  IMPRESSION: 1.  No acute displaced skull fractures or acute intracranial abnormalities. 2.  The appearance of the brain is normal.   Original Report Authenticated By: Florencia Reasons, M.D.      1. Syncope   2. Minor head injury with loss of consciousness       MDM  Patient / Family / Caregiver understand and agree with initial ED impression and plan with expectations set for  ED visit.  Pt stable in ED with no significant deterioration in condition.  Patient / Family / Caregiver informed of clinical course, understand medical decision-making process, and agree with plan.  I doubt any other EMC precluding discharge at this time including, but not necessarily limited to the following:CSI, ICH, VTach.         Hurman Horn, MD 01/10/12 [phone]

## 2012-01-10 ENCOUNTER — Encounter (HOSPITAL_COMMUNITY): Payer: Self-pay | Admitting: *Deleted

## 2012-01-10 LAB — GLUCOSE, CAPILLARY: Glucose-Capillary: 95 mg/dL (ref 70–99)

## 2012-01-11 ENCOUNTER — Emergency Department (HOSPITAL_COMMUNITY)
Admission: EM | Admit: 2012-01-11 | Discharge: 2012-01-11 | Disposition: A | Discharge status: Home / Self Care | Payer: Medicare Other | Attending: Emergency Medicine | Admitting: Emergency Medicine

## 2012-01-11 ENCOUNTER — Encounter (HOSPITAL_COMMUNITY): Payer: Self-pay

## 2012-01-11 DIAGNOSIS — H53149 Visual discomfort, unspecified: Secondary | ICD-10-CM | POA: Insufficient documentation

## 2012-01-11 DIAGNOSIS — F319 Bipolar disorder, unspecified: Secondary | ICD-10-CM | POA: Insufficient documentation

## 2012-01-11 DIAGNOSIS — J45909 Unspecified asthma, uncomplicated: Secondary | ICD-10-CM | POA: Insufficient documentation

## 2012-01-11 DIAGNOSIS — R55 Syncope and collapse: Secondary | ICD-10-CM | POA: Insufficient documentation

## 2012-01-11 DIAGNOSIS — Z79899 Other long term (current) drug therapy: Secondary | ICD-10-CM | POA: Insufficient documentation

## 2012-01-11 DIAGNOSIS — F411 Generalized anxiety disorder: Secondary | ICD-10-CM | POA: Insufficient documentation

## 2012-01-11 DIAGNOSIS — R51 Headache: Secondary | ICD-10-CM | POA: Insufficient documentation

## 2012-01-11 MED ORDER — DIPHENHYDRAMINE HCL 50 MG/ML IJ SOLN
25.0000 mg | Freq: Once | INTRAMUSCULAR | Status: AC
  Administered 2012-01-11: 25 mg via INTRAMUSCULAR
  Filled 2012-01-11: qty 1

## 2012-01-11 MED ORDER — METOCLOPRAMIDE HCL 5 MG/ML IJ SOLN
10.0000 mg | Freq: Once | INTRAMUSCULAR | Status: AC
  Administered 2012-01-11: 10 mg via INTRAMUSCULAR
  Filled 2012-01-11: qty 2

## 2012-01-11 NOTE — ED Notes (Signed)
Pt seen and treated here for fall/syncope 01/09/12- pt returns here for HA.  Pt reports light and noise sensitivity.  Pt here for reevaluation GCS PEERL no neuro deficits present

## 2012-01-11 NOTE — ED Provider Notes (Signed)
History     CSN: [id number]  Arrival date & time 01/11/12  1024   First MD Initiated Contact with Patient 01/11/12 1109      Chief Complaint  Patient presents with  . Headache    (Consider location/radiation/quality/duration/timing/severity/associated sxs/prior treatment) HPI Comments: Patient presents with HA. Patient with recent syncopal episode and head injury. Patient was seen in emergency department 2 days ago, had a negative head CT without contrast. Patient states that she has had a persistent frontal headache that is associated with photophobia, phonophobia. The pain is worse with movement. Patient has been taking Percocet at home but this has not helped. She is walking without difficulty. She has had nausea but no vomiting. No blurry vision. Onset acute. Course is constant. Nothing makes symptoms better. Patient is not on any blood thinning medications.   The history is provided by the patient.    Past Medical History  Diagnosis Date  . Bipolar 1 disorder   . Asthma   . Migraine   . Anxiety     Past Surgical History  Procedure Date  . Knee surgery     No family history on file.  History  Substance Use Topics  . Smoking status: Never Smoker   . Smokeless tobacco: Not on file  . Alcohol Use: No    OB History    Grav Para Term Preterm Abortions TAB SAB Ect Mult Living                  Review of Systems  Constitutional: Negative for fatigue.  HENT: Negative for neck pain and tinnitus.   Eyes: Negative for photophobia, pain and visual disturbance.  Respiratory: Negative for shortness of breath.   Cardiovascular: Negative for chest pain.  Gastrointestinal: Positive for nausea. Negative for vomiting.  Musculoskeletal: Negative for back pain and gait problem.  Skin: Negative for wound.  Neurological: Positive for headaches. Negative for dizziness, weakness, light-headedness and numbness.  Psychiatric/Behavioral: Negative for confusion and decreased  concentration.    Allergies  Almotriptan malate; Zomig; Cymbalta; and Imitrex  Home Medications   Current Outpatient Rx  Name Route Sig Dispense Refill  . ALBUTEROL SULFATE HFA 108 (90 BASE) MCG/ACT IN AERS Inhalation Inhale 2 puffs into the lungs every 6 (six) hours as needed. Shortness of breath    . ARIPIPRAZOLE 5 MG PO TABS Oral Take 5 mg by mouth at bedtime. Patient uses this medication to aid with sleep.    [name] CLONAZEPAM 1 MG PO TABS Oral Take 1 mg by mouth daily as needed. For anxiety    . FLUOXETINE HCL 20 MG PO TABS Oral Take 20 mg by mouth daily.    [name] LAMOTRIGINE 25 MG PO TABS Oral Take 25 mg by mouth at bedtime. Anti-seizure medication. Consult needed.    [name] METOCLOPRAMIDE HCL 10 MG PO TABS Oral Take 1 tablet (10 mg total) by mouth every 6 (six) hours as needed (nausea/headache). 6 tablet 0  . OXYCODONE-ACETAMINOPHEN 5-325 MG PO TABS Oral Take 2 tablets by mouth every 4 (four) hours as needed for pain. 20 tablet 0  . PHENAZOPYRIDINE HCL 100 MG PO TABS Oral Take 100 mg by mouth 3 (three) times daily as needed.    . SULFAMETHOXAZOLE-TRIMETHOPRIM 800-160 MG PO TABS Oral Take 1 tablet by mouth every 12 (twelve) hours.    . TRAZODONE HCL 100 MG PO TABS Oral Take 100 mg by mouth at bedtime.        BP 122/69  Pulse 93  Temp 98.8 F (37.1 C) (Oral)  Resp 16  Ht 5\' 4"  (1.626 m)  SpO2 99%  Physical Exam  Nursing note and vitals reviewed. Constitutional: She is oriented to person, place, and time. She appears well-developed and well-nourished.  HENT:  Head: Normocephalic and atraumatic. Head is without raccoon's eyes and without Battle's sign.  Right Ear: Tympanic membrane, external ear and ear canal normal. No hemotympanum.  Left Ear: Tympanic membrane, external ear and ear canal normal. No hemotympanum.  Nose: Nose normal. No nasal septal hematoma.  Mouth/Throat: Uvula is midline, oropharynx is clear and moist and mucous membranes are normal.  Eyes: Conjunctivae normal, EOM and  lids are normal. Pupils are equal, round, and reactive to light. Right eye exhibits no nystagmus. Left eye exhibits no nystagmus.       No visible hyphema noted  Neck: Normal range of motion. Neck supple.  Cardiovascular: Normal rate and regular rhythm.   Pulmonary/Chest: Effort normal and breath sounds normal.  Abdominal: Soft. There is no tenderness.  Musculoskeletal:       Cervical back: She exhibits normal range of motion, no tenderness and no bony tenderness.       Thoracic back: She exhibits no tenderness and no bony tenderness.       Lumbar back: She exhibits no tenderness and no bony tenderness.  Neurological: She is alert and oriented to person, place, and time. She has normal strength and normal reflexes. No cranial nerve deficit or sensory deficit. Coordination and gait normal. GCS eye subscore is 4. GCS verbal subscore is 5. GCS motor subscore is 6.  Skin: Skin is warm and dry.  Psychiatric: She has a normal mood and affect.    ED Course  Procedures (including critical care time)  Labs Reviewed - No data to display Ct Head Wo Contrast - If Head Trauma Is Known/suspected And/or Pt Is Taking Anticoagulant  01/09/2012  *RADIOLOGY REPORT*  Clinical Data: Loss of consciousness.  Fall with injury to the back the head.  CT HEAD WITHOUT CONTRAST  Technique:  Contiguous axial images were obtained from the base of the skull through the vertex without contrast.  Comparison: Head CT 09/29/2011.  Findings: No acute displaced skull fractures are identified.  No acute intracranial abnormality.  Specifically, no evidence of acute post-traumatic intracranial hemorrhage, no definite regions of acute/subacute cerebral ischemia, no focal mass, mass effect, hydrocephalus or abnormal intra or extra-axial fluid collections. The visualized paranasal sinuses and mastoids are well pneumatized.  IMPRESSION: 1.  No acute displaced skull fractures or acute intracranial abnormalities. 2.  The appearance of the  brain is normal.   Original Report Authenticated By: Florencia Reasons, M.D.      1. Headache     12:03 PM Patient seen and examined. Medications ordered. Neuro exam reassuring.   Vital signs reviewed and are as follows: Filed Vitals:   01/11/12 1057  BP: 122/69  Pulse: 93  Temp: 98.8 F (37.1 C)  Resp: 16   After treatment, patient still c/o pain but states she is ready for discharge.   Patient was counseled on symptoms that should indicate their return to the ED.  These include severe worsening headache, vision changes, confusion, loss of consciousness, trouble walking, nausea & vomiting, or weakness/tingling in extremities.      MDM  HA, approx 36 hrs after injury. Neurologically intact. CT was neg. Pt not on any blood thinners. Suspect patient is having HA 2/2 concussion. Patient requests d/c. Return instructions given. Urged PCP/neuro  f/u.         Renne Crigler, PA 01/12/12 [phone]

## 2012-01-13 NOTE — ED Provider Notes (Signed)
Medical screening examination/treatment/procedure(s) were performed by non-physician practitioner and as supervising physician I was immediately available for consultation/collaboration.  Jaymi Tinner R. Tricha Ruggirello, MD 01/13/12 2357 

## 2012-03-25 ENCOUNTER — Emergency Department (HOSPITAL_BASED_OUTPATIENT_CLINIC_OR_DEPARTMENT_OTHER)
Admission: EM | Admit: 2012-03-25 | Discharge: 2012-03-25 | Disposition: A | Discharge status: Home / Self Care | Payer: Medicare Other | Attending: Emergency Medicine | Admitting: Emergency Medicine

## 2012-03-25 ENCOUNTER — Encounter (HOSPITAL_BASED_OUTPATIENT_CLINIC_OR_DEPARTMENT_OTHER): Payer: Self-pay | Admitting: *Deleted

## 2012-03-25 ENCOUNTER — Emergency Department (HOSPITAL_BASED_OUTPATIENT_CLINIC_OR_DEPARTMENT_OTHER): Payer: Medicare Other

## 2012-03-25 DIAGNOSIS — IMO0002 Reserved for concepts with insufficient information to code with codable children: Secondary | ICD-10-CM | POA: Insufficient documentation

## 2012-03-25 DIAGNOSIS — S7000XA Contusion of unspecified hip, initial encounter: Secondary | ICD-10-CM | POA: Insufficient documentation

## 2012-03-25 DIAGNOSIS — F411 Generalized anxiety disorder: Secondary | ICD-10-CM | POA: Insufficient documentation

## 2012-03-25 DIAGNOSIS — J45909 Unspecified asthma, uncomplicated: Secondary | ICD-10-CM | POA: Insufficient documentation

## 2012-03-25 DIAGNOSIS — Z79899 Other long term (current) drug therapy: Secondary | ICD-10-CM | POA: Insufficient documentation

## 2012-03-25 DIAGNOSIS — Y929 Unspecified place or not applicable: Secondary | ICD-10-CM | POA: Insufficient documentation

## 2012-03-25 DIAGNOSIS — F3160 Bipolar disorder, current episode mixed, unspecified: Secondary | ICD-10-CM | POA: Insufficient documentation

## 2012-03-25 DIAGNOSIS — Y9389 Activity, other specified: Secondary | ICD-10-CM | POA: Insufficient documentation

## 2012-03-25 DIAGNOSIS — G43909 Migraine, unspecified, not intractable, without status migrainosus: Secondary | ICD-10-CM | POA: Insufficient documentation

## 2012-03-25 MED ORDER — HYDROCODONE-ACETAMINOPHEN 5-325 MG PO TABS: 2.0000 | ORAL_TABLET | Freq: Four times a day (QID) | ORAL | Status: DC | PRN

## 2012-03-25 NOTE — ED Provider Notes (Signed)
History     CSN: [id number]  Arrival date & time 03/25/12  1226   First MD Initiated Contact with Patient 03/25/12 1336      Chief Complaint  Patient presents with  . Fall    (Consider location/radiation/quality/duration/timing/severity/associated sxs/prior treatment) HPI Pt reports she stumbled and fell last night landing on her L side, complaining of moderate to severe L hip and thigh pain worse with movement and 'barely able to walk'. Denies head injury or LOC.   Past Medical History  Diagnosis Date  . Bipolar 1 disorder   . Asthma   . Migraine   . Anxiety     Past Surgical History  Procedure Date  . Knee surgery     No family history on file.  History  Substance Use Topics  . Smoking status: Never Smoker   . Smokeless tobacco: Not on file  . Alcohol Use: No    OB History    Grav Para Term Preterm Abortions TAB SAB Ect Mult Living                  Review of Systems All other systems reviewed and are negative except as noted in HPI.   Allergies  Almotriptan malate; Zomig; Cymbalta; and Imitrex  Home Medications   Current Outpatient Rx  Name  Route  Sig  Dispense  Refill  . ALBUTEROL SULFATE HFA 108 (90 BASE) MCG/ACT IN AERS   Inhalation   Inhale 2 puffs into the lungs every 6 (six) hours as needed. Shortness of breath         . ARIPIPRAZOLE 5 MG PO TABS   Oral   Take 5 mg by mouth at bedtime. Patient uses this medication to aid with sleep.         [name] CLONAZEPAM 1 MG PO TABS   Oral   Take 1 mg by mouth daily as needed. For anxiety         . FLUOXETINE HCL 20 MG PO TABS   Oral   Take 20 mg by mouth daily.         [name] LAMOTRIGINE 25 MG PO TABS   Oral   Take 25 mg by mouth at bedtime. Anti-seizure medication. Consult needed.         [name] METOCLOPRAMIDE HCL 10 MG PO TABS   Oral   Take 1 tablet (10 mg total) by mouth every 6 (six) hours as needed (nausea/headache).   6 tablet   0   . OXYCODONE-ACETAMINOPHEN 5-325 MG PO TABS   Oral  Take 2 tablets by mouth every 4 (four) hours as needed for pain.   20 tablet   0   . PHENAZOPYRIDINE HCL 100 MG PO TABS   Oral   Take 100 mg by mouth 3 (three) times daily as needed.         . TRAZODONE HCL 100 MG PO TABS   Oral   Take 100 mg by mouth at bedtime.             BP 96/70  Pulse 106  Temp 98.1 F (36.7 C) (Oral)  Resp 16  Ht 5\' 4"  (1.626 m)  Wt 170 lb (77.111 kg)  BMI 29.18 kg/m2  SpO2 96%  Physical Exam  Nursing note and vitals reviewed. Constitutional: She is oriented to person, place, and time. She appears well-developed and well-nourished.  HENT:  Head: Normocephalic and atraumatic.  Eyes: EOM are normal. Pupils are equal, round, and reactive to light.  Neck: Normal range of motion. Neck supple.  Cardiovascular: Normal rate, normal heart sounds and intact distal pulses.   Pulmonary/Chest: Effort normal and breath sounds normal.  Abdominal: Bowel sounds are normal. She exhibits no distension. There is no tenderness.  Musculoskeletal: She exhibits tenderness (tender to soft tissue of L hip and lateral thigh). She exhibits no edema.       Pain with ROM L hip, no deformity  Neurological: She is alert and oriented to person, place, and time. She has normal strength. No cranial nerve deficit or sensory deficit.  Skin: Skin is warm and dry. No rash noted.  Psychiatric: She has a normal mood and affect.    ED Course  Procedures (including critical care time)  Labs Reviewed - No data to display Dg Hip Complete Left  03/25/2012  *RADIOLOGY REPORT*  Clinical Data: Hip pain after fall  LEFT HIP - COMPLETE 2+ VIEW  Comparison: None.  Findings: Frontal pelvis with AP and frog-leg lateral views of the left hip show no fracture.  Joint space in the hips is well preserved and symmetric.  The symphysis pubis and SI joints are normal.  IUD projects over the central anatomic pelvis.  IMPRESSION: No acute bony findings.   Original Report Authenticated By: Kennith Center,  M.D.      No diagnosis found.    MDM  Xray neg, likely soft tissue injury. Advised PCP followup. Pt was observed to walk without difficulty to the room.         Charles B. Bernette Mayers, MD 03/25/12 1355

## 2012-03-25 NOTE — ED Notes (Signed)
States that she fell last night and now "something is wrong with her hip, she cant walk" States shes in a lot of pain despite taking tylenol and advil.

## 2012-03-28 ENCOUNTER — Emergency Department (HOSPITAL_BASED_OUTPATIENT_CLINIC_OR_DEPARTMENT_OTHER): Payer: Medicare Other

## 2012-03-28 ENCOUNTER — Emergency Department (HOSPITAL_BASED_OUTPATIENT_CLINIC_OR_DEPARTMENT_OTHER)
Admission: EM | Admit: 2012-03-28 | Discharge: 2012-03-28 | Disposition: A | Discharge status: Home / Self Care | Payer: Medicare Other | Attending: Emergency Medicine | Admitting: Emergency Medicine

## 2012-03-28 ENCOUNTER — Encounter (HOSPITAL_BASED_OUTPATIENT_CLINIC_OR_DEPARTMENT_OTHER): Payer: Self-pay | Admitting: *Deleted

## 2012-03-28 DIAGNOSIS — Z8679 Personal history of other diseases of the circulatory system: Secondary | ICD-10-CM | POA: Insufficient documentation

## 2012-03-28 DIAGNOSIS — Z79899 Other long term (current) drug therapy: Secondary | ICD-10-CM | POA: Insufficient documentation

## 2012-03-28 DIAGNOSIS — F411 Generalized anxiety disorder: Secondary | ICD-10-CM | POA: Insufficient documentation

## 2012-03-28 DIAGNOSIS — M25559 Pain in unspecified hip: Secondary | ICD-10-CM | POA: Insufficient documentation

## 2012-03-28 DIAGNOSIS — F319 Bipolar disorder, unspecified: Secondary | ICD-10-CM | POA: Insufficient documentation

## 2012-03-28 DIAGNOSIS — J45909 Unspecified asthma, uncomplicated: Secondary | ICD-10-CM | POA: Insufficient documentation

## 2012-03-28 MED ORDER — CYCLOBENZAPRINE HCL 10 MG PO TABS: 10.0000 mg | ORAL_TABLET | Freq: Two times a day (BID) | ORAL | Status: DC | PRN

## 2012-03-28 MED ORDER — NAPROXEN 375 MG PO TABS: 375.0000 mg | ORAL_TABLET | Freq: Two times a day (BID) | ORAL | Status: DC

## 2012-03-28 NOTE — ED Provider Notes (Signed)
History     CSN: [id number]  Arrival date & time 03/28/12  4098   First MD Initiated Contact with Patient 03/28/12 [phone]      Chief Complaint  Patient presents with  . Hip Pain    (Consider location/radiation/quality/duration/timing/severity/associated sxs/prior treatment) HPI Patient states here three days ago after fall down four steps states landed on left hip.  Patient seen here and had x-Jailon Schaible told she had a contusion.  States given "crappy pain medicine" that she should have saved her money on, vicodin doesn't do nothing to me.   I haven't been able to sleep for two days.  Denies any other injuries.  States able to walk on it but barely.  Pain is most in left groin.  Denies striking head.  States she tried tylenol, ibuprofen, tiger balm, epsom salt, heating pad without relief.   Past Medical History  Diagnosis Date  . Bipolar 1 disorder   . Asthma   . Migraine   . Anxiety     Past Surgical History  Procedure Date  . Knee surgery     No family history on file.  History  Substance Use Topics  . Smoking status: Never Smoker   . Smokeless tobacco: Not on file  . Alcohol Use: No    OB History    Grav Para Term Preterm Abortions TAB SAB Ect Mult Living                  Review of Systems  All other systems reviewed and are negative.    Allergies  Almotriptan malate; Zomig; Cymbalta; and Imitrex  Home Medications   Current Outpatient Rx  Name  Route  Sig  Dispense  Refill  . ALBUTEROL SULFATE HFA 108 (90 BASE) MCG/ACT IN AERS   Inhalation   Inhale 2 puffs into the lungs every 6 (six) hours as needed. Shortness of breath         . ARIPIPRAZOLE 5 MG PO TABS   Oral   Take 5 mg by mouth at bedtime. Patient uses this medication to aid with sleep.         [name] CLONAZEPAM 1 MG PO TABS   Oral   Take 1 mg by mouth daily as needed. For anxiety         . FLUOXETINE HCL 20 MG PO TABS   Oral   Take 20 mg by mouth daily.         [name] HYDROCODONE-ACETAMINOPHEN  5-325 MG PO TABS   Oral   Take 2 tablets by mouth every 6 (six) hours as needed for pain.   15 tablet   0   . LAMOTRIGINE 25 MG PO TABS   Oral   Take 25 mg by mouth at bedtime. Anti-seizure medication. Consult needed.         [name] METOCLOPRAMIDE HCL 10 MG PO TABS   Oral   Take 1 tablet (10 mg total) by mouth every 6 (six) hours as needed (nausea/headache).   6 tablet   0   . OXYCODONE-ACETAMINOPHEN 5-325 MG PO TABS   Oral   Take 2 tablets by mouth every 4 (four) hours as needed for pain.   20 tablet   0   . PHENAZOPYRIDINE HCL 100 MG PO TABS   Oral   Take 100 mg by mouth 3 (three) times daily as needed.         . TRAZODONE HCL 100 MG PO TABS   Oral   Take 100  mg by mouth at bedtime.             BP 109/72  Pulse 102  Temp 98.4 F (36.9 C)  Resp 16  SpO2 100%  Physical Exam  Nursing note and vitals reviewed. Constitutional: She is oriented to person, place, and time. She appears well-developed and well-nourished.  HENT:  Head: Normocephalic and atraumatic.  Right Ear: External ear normal.  Left Ear: External ear normal.  Nose: Nose normal.  Mouth/Throat: Oropharynx is clear and moist.  Eyes: Conjunctivae normal and EOM are normal. Pupils are equal, round, and reactive to light.  Neck: Normal range of motion. Neck supple.  Cardiovascular: Normal rate, regular rhythm, normal heart sounds and intact distal pulses.   Pulmonary/Chest: Effort normal and breath sounds normal.  Abdominal: Soft. Bowel sounds are normal.  Musculoskeletal: Normal range of motion.       Patient with tenderness left groin, left lateral hip, left buttock and lower spine.  Full arom low back, left hip, left  Knee and ankle.    Neurological: She is alert and oriented to person, place, and time.  Skin: Skin is warm and dry.  Psychiatric: Her speech is normal. Judgment and thought content normal. Her affect is angry and inappropriate. Cognition and memory are normal.    ED Course    Procedures (including critical care time)  Labs Reviewed - No data to display No results found.  Dg Lumbar Spine Complete  03/28/2012  *RADIOLOGY REPORT*  Clinical Data: Fall 03/25/2012.  Low back and hip pain.  LUMBAR SPINE - COMPLETE 4+ VIEW  Comparison: No comparison lumbar spine plain films.  Findings: No fracture or malalignment.  Minimal curvature anterior aspect of T9, T10, T11 and T12 felt to be related to degenerative changes rather than fracture.  Minimal L4-5 disc space narrowing and retrolisthesis L4.  IUD is in place.  IMPRESSION: No fracture.  Please see above.   Original Report Authenticated By: Lacy Duverney, M.D.    Dg Pelvis 1-2 Views  03/28/2012  *RADIOLOGY REPORT*  Clinical Data: Bilateral hip and lower back pain since falling 03/25/2012.  PELVIS - 1-2 VIEW  Comparison: 03/25/2012.  Findings: No fracture or dislocation.  If primary pelvic/hip fracture remained of high clinical concern and further investigation were clinically desired, MR may prove helpful.  IUD is in place.  IMPRESSION: No plain film evidence of fracture.  Please see above.   Original Report Authenticated By: Lacy Duverney, M.D.    Dg Hip Complete Left  03/25/2012  *RADIOLOGY REPORT*  Clinical Data: Hip pain after fall  LEFT HIP - COMPLETE 2+ VIEW  Comparison: None.  Findings: Frontal pelvis with AP and frog-leg lateral views of the left hip show no fracture.  Joint space in the hips is well preserved and symmetric.  The symphysis pubis and SI joints are normal.  IUD projects over the central anatomic pelvis.  IMPRESSION: No acute bony findings.   Original Report Authenticated By: Kennith Center, M.D.      MDM  Repeat x-rays reveal no sign of fracture.  Plan crutches, muscle relaxants, nsaids and f/u with ortho.            Hilario Quarry, MD 03/28/12 [phone]

## 2012-03-28 NOTE — ED Notes (Signed)
Pt reports seen 3 days ago for same. Fell 3 days ago and pain to left hip. States dx with contusion to hip but pain is getting worse.

## 2012-06-16 ENCOUNTER — Other Ambulatory Visit: Payer: Self-pay | Admitting: Neurology

## 2012-06-29 ENCOUNTER — Encounter (HOSPITAL_COMMUNITY): Payer: Self-pay | Admitting: *Deleted

## 2012-06-29 ENCOUNTER — Emergency Department (HOSPITAL_COMMUNITY)
Admission: EM | Admit: 2012-06-29 | Discharge: 2012-06-30 | Disposition: A | Discharge status: Home / Self Care | Payer: Medicare Other | Attending: Emergency Medicine | Admitting: Emergency Medicine

## 2012-06-29 DIAGNOSIS — J111 Influenza due to unidentified influenza virus with other respiratory manifestations: Secondary | ICD-10-CM

## 2012-06-29 DIAGNOSIS — F411 Generalized anxiety disorder: Secondary | ICD-10-CM | POA: Insufficient documentation

## 2012-06-29 DIAGNOSIS — IMO0001 Reserved for inherently not codable concepts without codable children: Secondary | ICD-10-CM | POA: Insufficient documentation

## 2012-06-29 DIAGNOSIS — J45909 Unspecified asthma, uncomplicated: Secondary | ICD-10-CM | POA: Insufficient documentation

## 2012-06-29 DIAGNOSIS — M791 Myalgia, unspecified site: Secondary | ICD-10-CM

## 2012-06-29 DIAGNOSIS — Z79899 Other long term (current) drug therapy: Secondary | ICD-10-CM | POA: Insufficient documentation

## 2012-06-29 DIAGNOSIS — Z8679 Personal history of other diseases of the circulatory system: Secondary | ICD-10-CM | POA: Insufficient documentation

## 2012-06-29 DIAGNOSIS — R059 Cough, unspecified: Secondary | ICD-10-CM | POA: Insufficient documentation

## 2012-06-29 DIAGNOSIS — R05 Cough: Secondary | ICD-10-CM | POA: Insufficient documentation

## 2012-06-29 DIAGNOSIS — Z3202 Encounter for pregnancy test, result negative: Secondary | ICD-10-CM | POA: Insufficient documentation

## 2012-06-29 DIAGNOSIS — F319 Bipolar disorder, unspecified: Secondary | ICD-10-CM | POA: Insufficient documentation

## 2012-06-29 LAB — BASIC METABOLIC PANEL
CO2: 27 mEq/L (ref 19–32)
Calcium: 9 mg/dL (ref 8.4–10.5)
GFR calc non Af Amer: 89 mL/min — ABNORMAL LOW (ref >90)
Sodium: 137 mEq/L (ref 135–145)

## 2012-06-29 LAB — CBC WITH DIFFERENTIAL/PLATELET
Basophils Absolute: 0 10*3/uL (ref 0.0–0.1)
Eosinophils Relative: 1 % (ref 0–5)
Lymphocytes Relative: 40 % (ref 12–46)
MCV: 88.3 fL (ref 78.0–100.0)
Platelets: 294 10*3/uL (ref 150–400)
RDW: 12.6 % (ref 11.5–15.5)
WBC: 8.3 10*3/uL (ref 4.0–10.5)

## 2012-06-29 MED ORDER — KETOROLAC TROMETHAMINE 30 MG/ML IJ SOLN
30.0000 mg | Freq: Once | INTRAMUSCULAR | Status: AC
  Administered 2012-06-29: 30 mg via INTRAVENOUS
  Filled 2012-06-29: qty 1

## 2012-06-29 MED ORDER — POTASSIUM CHLORIDE 10 MEQ/100ML IV SOLN
10.0000 meq | INTRAVENOUS | Status: AC
  Administered 2012-06-29 – 2012-06-30 (×2): 10 meq via INTRAVENOUS
  Filled 2012-06-29 (×2): qty 100

## 2012-06-29 MED ORDER — SODIUM CHLORIDE 0.9 % IV BOLUS (SEPSIS)
2000.0000 mL | Freq: Once | INTRAVENOUS | Status: AC
  Administered 2012-06-29: 1000 mL via INTRAVENOUS

## 2012-06-29 NOTE — ED Notes (Signed)
Pt diagnosed with flu yesterday; today having shakes; headache; nauseated; trouble breathing;

## 2012-06-29 NOTE — ED Notes (Signed)
Pt sts that she was able to urinate at this time.  Pt is aware that urine is needed for a sample.

## 2012-06-30 ENCOUNTER — Emergency Department (HOSPITAL_COMMUNITY): Payer: Medicare Other

## 2012-06-30 LAB — URINALYSIS, ROUTINE W REFLEX MICROSCOPIC
Bilirubin Urine: NEGATIVE
Glucose, UA: NEGATIVE mg/dL
Hgb urine dipstick: NEGATIVE
Ketones, ur: NEGATIVE mg/dL
Nitrite: NEGATIVE
Protein, ur: NEGATIVE mg/dL
Specific Gravity, Urine: 1.019 (ref 1.005–1.030)
Urobilinogen, UA: 1 mg/dL (ref 0.0–1.0)
pH: 7.5 (ref 5.0–8.0)

## 2012-06-30 LAB — CK: Total CK: 107 U/L (ref 7–177)

## 2012-06-30 LAB — URINE MICROSCOPIC-ADD ON

## 2012-06-30 LAB — PREGNANCY, URINE: Preg Test, Ur: NEGATIVE

## 2012-06-30 MED ORDER — IBUPROFEN 800 MG PO TABS: 800.0000 mg | ORAL_TABLET | Freq: Three times a day (TID) | ORAL | Status: DC | PRN

## 2012-06-30 MED ORDER — HYDROCODONE-ACETAMINOPHEN 5-325 MG PO TABS: 1.0000 | ORAL_TABLET | Freq: Four times a day (QID) | ORAL | Status: DC | PRN

## 2012-06-30 MED ORDER — ONDANSETRON HCL 4 MG/2ML IJ SOLN
4.0000 mg | Freq: Once | INTRAMUSCULAR | Status: AC
  Administered 2012-06-30: 4 mg via INTRAVENOUS
  Filled 2012-06-30: qty 2

## 2012-06-30 NOTE — ED Notes (Signed)
Pt. Reminded for urinalysis, verbalized understanding . 

## 2012-06-30 NOTE — ED Provider Notes (Signed)
History     CSN: [id number]  Arrival date & time 06/29/12  2029   First MD Initiated Contact with Patient 06/29/12 2228      Chief Complaint  Patient presents with  . Generalized Body Aches    (Consider location/radiation/quality/duration/timing/severity/associated sxs/prior treatment) HPI Patient presents to the emergency department with cough, and body aches for the last 2 days.  Patient, states she was seen by her doctor and told that she has influenza.  Patient denies chest pain, shortness of breath, vomiting, sore throat, nasal congestion, blurred vision, headache, dizziness, or syncope.  Patient also denies any rash.  Patient, states she did not take anything prior to arrival for her symptoms.  Patient, states nothing seems to make her condition, better or worse.   Past Medical History  Diagnosis Date  . Bipolar 1 disorder   . Asthma   . Migraine   . Anxiety     Past Surgical History  Procedure Laterality Date  . Knee surgery      No family history on file.  History  Substance Use Topics  . Smoking status: Never Smoker   . Smokeless tobacco: Not on file  . Alcohol Use: No    OB History   Grav Para Term Preterm Abortions TAB SAB Ect Mult Living                  Review of Systems All other systems negative except as documented in the HPI. All pertinent positives and negatives as reviewed in the HPI. Allergies  Almotriptan malate; Zomig; Cymbalta; and Imitrex  Home Medications   Current Outpatient Rx  Name  Route  Sig  Dispense  Refill  . albuterol (PROVENTIL HFA;VENTOLIN HFA) 108 (90 BASE) MCG/ACT inhaler   Inhalation   Inhale 2 puffs into the lungs every 6 (six) hours as needed. Shortness of breath         . ARIPiprazole (ABILIFY) 5 MG tablet   Oral   Take 5 mg by mouth at bedtime. Patient uses this medication to aid with sleep.         . clonazePAM (KLONOPIN) 1 MG tablet   Oral   Take 1 mg by mouth daily as needed. For anxiety         .  lamoTRIgine (LAMICTAL) 25 MG tablet   Oral   Take 25 mg by mouth at bedtime. Anti-seizure medication. Consult needed.         . promethazine (PHENERGAN) 25 MG tablet   Oral   Take 25 mg by mouth every 6 (six) hours as needed for nausea.         . traZODone (DESYREL) 100 MG tablet   Oral   Take 100 mg by mouth at bedtime.             BP 103/58  Pulse 70  Temp(Src) 98.1 F (36.7 C) (Oral)  Resp 18  SpO2 100%  Physical Exam  Nursing note and vitals reviewed. Constitutional: She is oriented to person, place, and time. She appears well-developed and well-nourished. No distress.  HENT:  Head: Normocephalic and atraumatic.  Mouth/Throat: Oropharynx is clear and moist. No oropharyngeal exudate.  Eyes: Pupils are equal, round, and reactive to light.  Neck: Normal range of motion. Neck supple.  Cardiovascular: Normal rate, regular rhythm and normal heart sounds.  Exam reveals no gallop and no friction rub.   No murmur heard. Pulmonary/Chest: Effort normal and breath sounds normal. No respiratory distress.  Abdominal: Soft. Bowel  sounds are normal. She exhibits no distension. There is no tenderness.  Neurological: She is alert and oriented to person, place, and time. Coordination normal.  Skin: Skin is warm and dry. No erythema.    ED Course  Procedures (including critical care time)  Labs Reviewed  BASIC METABOLIC PANEL - Abnormal; Notable for the following:    Potassium 2.8 (*)    Glucose, Bld 108 (*)    GFR calc non Af Amer 89 (*)    All other components within normal limits  CBC WITH DIFFERENTIAL  URINALYSIS, ROUTINE W REFLEX MICROSCOPIC  PREGNANCY, URINE   Dg Chest 2 View  06/30/2012  *RADIOLOGY REPORT*  Clinical Data: Fever and cough.  History of asthma.  CHEST - 2 VIEW  Comparison: 02/25/2011  Findings: The heart size and pulmonary vascularity are normal. The lungs appear clear and expanded without focal air space disease or consolidation. No blunting of the  costophrenic angles.  No pneumothorax.  Mediastinal contours appear intact.  Mild degenerative changes in the thoracic spine.  No significant change since previous study.  IMPRESSION: No evidence of active pulmonary disease.   Original Report Authenticated By: Burman Nieves, M.D.    She'll be treated for influenza-like illness with body aches.  She is advised to return here as needed for any worsening in her condition.  Patient is advised to increase her fluid intake.   MDM  MDM Reviewed: vitals and nursing note Interpretation: labs and x-ray           Carlyle Dolly, PA-C 06/30/12 0141

## 2012-07-04 ENCOUNTER — Emergency Department (HOSPITAL_BASED_OUTPATIENT_CLINIC_OR_DEPARTMENT_OTHER)
Admission: EM | Admit: 2012-07-04 | Discharge: 2012-07-04 | Disposition: A | Discharge status: Home / Self Care | Payer: Medicare Other | Attending: Emergency Medicine | Admitting: Emergency Medicine

## 2012-07-04 ENCOUNTER — Encounter (HOSPITAL_BASED_OUTPATIENT_CLINIC_OR_DEPARTMENT_OTHER): Payer: Self-pay | Admitting: *Deleted

## 2012-07-04 DIAGNOSIS — G43909 Migraine, unspecified, not intractable, without status migrainosus: Secondary | ICD-10-CM | POA: Insufficient documentation

## 2012-07-04 DIAGNOSIS — F411 Generalized anxiety disorder: Secondary | ICD-10-CM | POA: Insufficient documentation

## 2012-07-04 DIAGNOSIS — Z3202 Encounter for pregnancy test, result negative: Secondary | ICD-10-CM | POA: Insufficient documentation

## 2012-07-04 DIAGNOSIS — R05 Cough: Secondary | ICD-10-CM | POA: Insufficient documentation

## 2012-07-04 DIAGNOSIS — R110 Nausea: Secondary | ICD-10-CM | POA: Insufficient documentation

## 2012-07-04 DIAGNOSIS — J45909 Unspecified asthma, uncomplicated: Secondary | ICD-10-CM | POA: Insufficient documentation

## 2012-07-04 DIAGNOSIS — Z79899 Other long term (current) drug therapy: Secondary | ICD-10-CM | POA: Insufficient documentation

## 2012-07-04 DIAGNOSIS — J069 Acute upper respiratory infection, unspecified: Secondary | ICD-10-CM

## 2012-07-04 DIAGNOSIS — R059 Cough, unspecified: Secondary | ICD-10-CM | POA: Insufficient documentation

## 2012-07-04 DIAGNOSIS — M791 Myalgia, unspecified site: Secondary | ICD-10-CM

## 2012-07-04 DIAGNOSIS — R6883 Chills (without fever): Secondary | ICD-10-CM | POA: Insufficient documentation

## 2012-07-04 DIAGNOSIS — IMO0001 Reserved for inherently not codable concepts without codable children: Secondary | ICD-10-CM | POA: Insufficient documentation

## 2012-07-04 DIAGNOSIS — R6889 Other general symptoms and signs: Secondary | ICD-10-CM | POA: Insufficient documentation

## 2012-07-04 DIAGNOSIS — R52 Pain, unspecified: Secondary | ICD-10-CM | POA: Insufficient documentation

## 2012-07-04 DIAGNOSIS — F319 Bipolar disorder, unspecified: Secondary | ICD-10-CM | POA: Insufficient documentation

## 2012-07-04 LAB — URINALYSIS, ROUTINE W REFLEX MICROSCOPIC
Ketones, ur: 40 mg/dL — AB
Nitrite: NEGATIVE
Urobilinogen, UA: 0.2 mg/dL (ref 0.0–1.0)
pH: 7.5 (ref 5.0–8.0)

## 2012-07-04 LAB — CBC WITH DIFFERENTIAL/PLATELET
Eosinophils Absolute: 0.1 10*3/uL (ref 0.0–0.7)
Eosinophils Relative: 2 % (ref 0–5)
HCT: 41.6 % (ref 36.0–46.0)
Hemoglobin: 13.4 g/dL (ref 12.0–15.0)
Lymphs Abs: 2.4 10*3/uL (ref 0.7–4.0)
MCH: 28.4 pg (ref 26.0–34.0)
MCV: 88.1 fL (ref 78.0–100.0)
Monocytes Relative: 11 % (ref 3–12)
RBC: 4.72 MIL/uL (ref 3.87–5.11)

## 2012-07-04 LAB — URINE MICROSCOPIC-ADD ON

## 2012-07-04 LAB — COMPREHENSIVE METABOLIC PANEL
Alkaline Phosphatase: 62 U/L (ref 39–117)
BUN: 5 mg/dL — ABNORMAL LOW (ref 6–23)
Calcium: 9.4 mg/dL (ref 8.4–10.5)
GFR calc Af Amer: 84 mL/min — ABNORMAL LOW (ref >90)
GFR calc non Af Amer: 72 mL/min — ABNORMAL LOW (ref >90)
Glucose, Bld: 85 mg/dL (ref 70–99)
Potassium: 4.1 mEq/L (ref 3.5–5.1)
Total Protein: 7.5 g/dL (ref 6.0–8.3)

## 2012-07-04 MED ORDER — KETOROLAC TROMETHAMINE 30 MG/ML IJ SOLN
30.0000 mg | Freq: Once | INTRAMUSCULAR | Status: AC
  Administered 2012-07-04: 30 mg via INTRAVENOUS
  Filled 2012-07-04: qty 1

## 2012-07-04 MED ORDER — ONDANSETRON 8 MG PO TBDP: 8.0000 mg | ORAL_TABLET | Freq: Three times a day (TID) | ORAL | Status: DC | PRN

## 2012-07-04 MED ORDER — ONDANSETRON HCL 4 MG/2ML IJ SOLN
4.0000 mg | Freq: Once | INTRAMUSCULAR | Status: AC
  Administered 2012-07-04: 4 mg via INTRAVENOUS
  Filled 2012-07-04: qty 2

## 2012-07-04 MED ORDER — SODIUM CHLORIDE 0.9 % IV BOLUS (SEPSIS)
1000.0000 mL | Freq: Once | INTRAVENOUS | Status: AC
  Administered 2012-07-04: 1000 mL via INTRAVENOUS

## 2012-07-04 NOTE — ED Notes (Signed)
Pt is unable to void. 

## 2012-07-04 NOTE — ED Notes (Signed)
Pt is unable to void at present time. 

## 2012-07-04 NOTE — ED Notes (Signed)
Seen here 4 days ago for same DX flu like illness , return today for h/a and bodyaches

## 2012-07-04 NOTE — ED Notes (Signed)
Pt reports a recent diagnosis of influenza and evaluation at [facility] ED.  She reports generalized body aches, headache and nausea unrelieved after taking prescribed Ibuprofen, Toradol, and Zofran.

## 2012-07-04 NOTE — ED Provider Notes (Signed)
History     CSN: [id number]  Arrival date & time 07/04/12  1426   First MD Initiated Contact with Patient 07/04/12 1504      Chief Complaint  Patient presents with  . Headache  . Generalized Body Aches    (Consider location/radiation/quality/duration/timing/severity/associated sxs/prior treatment) HPI 37 y.o. Female diagnosed with influenza like illness 5 days ago with symptoms starting the day before that.  She has weakness, body aches, runy nose, headache, chills, nausea but no vomiting.  States she is feeling no better, body aches are worse and not eating or drinking because she doesn't have an appetite.  She had gatorade earlier today and is not vomiting.  Headache in front over left eye, still has some runny nose, cough noproductive, denies sob.  IUD in place with no periods for four years.  Past Medical History  Diagnosis Date  . Bipolar 1 disorder   . Asthma   . Migraine   . Anxiety     Past Surgical History  Procedure Laterality Date  . Knee surgery      History reviewed. No pertinent family history.  History  Substance Use Topics  . Smoking status: Never Smoker   . Smokeless tobacco: Not on file  . Alcohol Use: No    OB History   Grav Para Term Preterm Abortions TAB SAB Ect Mult Living                  Review of Systems  All other systems reviewed and are negative.    Allergies  Almotriptan malate; Zomig; Cymbalta; and Imitrex  Home Medications   Current Outpatient Rx  Name  Route  Sig  Dispense  Refill  . albuterol (PROVENTIL HFA;VENTOLIN HFA) 108 (90 BASE) MCG/ACT inhaler   Inhalation   Inhale 2 puffs into the lungs every 6 (six) hours as needed. Shortness of breath         . ARIPiprazole (ABILIFY) 5 MG tablet   Oral   Take 5 mg by mouth at bedtime. Patient uses this medication to aid with sleep.         . clonazePAM (KLONOPIN) 1 MG tablet   Oral   Take 1 mg by mouth daily as needed. For anxiety         . HYDROcodone-acetaminophen  (NORCO/VICODIN) 5-325 MG per tablet   Oral   Take 1 tablet by mouth every 6 (six) hours as needed for pain.   15 tablet   0   . ibuprofen (ADVIL,MOTRIN) 800 MG tablet   Oral   Take 1 tablet (800 mg total) by mouth every 8 (eight) hours as needed for pain.   21 tablet   0   . lamoTRIgine (LAMICTAL) 25 MG tablet   Oral   Take 25 mg by mouth at bedtime. Anti-seizure medication. Consult needed.         . promethazine (PHENERGAN) 25 MG tablet   Oral   Take 25 mg by mouth every 6 (six) hours as needed for nausea.         . traZODone (DESYREL) 100 MG tablet   Oral   Take 100 mg by mouth at bedtime.             BP 102/69  Pulse 95  Temp(Src) 98.7 F (37.1 C) (Oral)  Resp 16  Ht 5\' 4"  (1.626 m)  Wt 182 lb (82.555 kg)  BMI 31.22 kg/m2  SpO2 98%  Physical Exam  Nursing note and vitals reviewed. Constitutional:  She is oriented to person, place, and time. She appears well-developed and well-nourished.  HENT:  Head: Normocephalic and atraumatic.  Eyes: Conjunctivae and EOM are normal. Pupils are equal, round, and reactive to light.  Neck: Normal range of motion. Neck supple.  Cardiovascular: Normal rate, regular rhythm, normal heart sounds and intact distal pulses.   Pulmonary/Chest: Effort normal and breath sounds normal.  Abdominal: Soft. Bowel sounds are normal.  Musculoskeletal: Normal range of motion.  Neurological: She is alert and oriented to person, place, and time. She has normal reflexes.  Skin: Skin is warm and dry.  Psychiatric: She has a normal mood and affect. Her behavior is normal. Judgment and thought content normal.    ED Course  Procedures (including critical care time)  Labs Reviewed - No data to display No results found.   No diagnosis found.    Results for orders placed during the hospital encounter of 07/04/12  CBC WITH DIFFERENTIAL      Result Value Range   WBC 6.2  4.0 - 10.5 K/uL   RBC 4.72  3.87 - 5.11 MIL/uL   Hemoglobin 13.4   12.0 - 15.0 g/dL   HCT 16.1  09.6 - 04.5 %   MCV 88.1  78.0 - 100.0 fL   MCH 28.4  26.0 - 34.0 pg   MCHC 32.2  30.0 - 36.0 g/dL   RDW 40.9  81.1 - 91.4 %   Platelets 232  150 - 400 K/uL   Neutrophils Relative 48  43 - 77 %   Neutro Abs 3.0  1.7 - 7.7 K/uL   Lymphocytes Relative 39  12 - 46 %   Lymphs Abs 2.4  0.7 - 4.0 K/uL   Monocytes Relative 11  3 - 12 %   Monocytes Absolute 0.7  0.1 - 1.0 K/uL   Eosinophils Relative 2  0 - 5 %   Eosinophils Absolute 0.1  0.0 - 0.7 K/uL   Basophils Relative 0  0 - 1 %   Basophils Absolute 0.0  0.0 - 0.1 K/uL  COMPREHENSIVE METABOLIC PANEL      Result Value Range   Sodium 137  135 - 145 mEq/L   Potassium 4.1  3.5 - 5.1 mEq/L   Chloride 103  96 - 112 mEq/L   CO2 26  19 - 32 mEq/L   Glucose, Bld 85  70 - 99 mg/dL   BUN 5 (*) 6 - 23 mg/dL   Creatinine, Ser 7.82  0.50 - 1.10 mg/dL   Calcium 9.4  8.4 - 95.6 mg/dL   Total Protein 7.5  6.0 - 8.3 g/dL   Albumin 3.6  3.5 - 5.2 g/dL   AST 17  0 - 37 U/L   ALT 8  0 - 35 U/L   Alkaline Phosphatase 62  39 - 117 U/L   Total Bilirubin 0.4  0.3 - 1.2 mg/dL   GFR calc non Af Amer 72 (*) >90 mL/min   GFR calc Af Amer 84 (*) >90 mL/min  URINALYSIS, ROUTINE W REFLEX MICROSCOPIC      Result Value Range   Color, Urine YELLOW  YELLOW   APPearance TURBID (*) CLEAR   Specific Gravity, Urine 1.012  1.005 - 1.030   pH 7.5  5.0 - 8.0   Glucose, UA NEGATIVE  NEGATIVE mg/dL   Hgb urine dipstick NEGATIVE  NEGATIVE   Bilirubin Urine NEGATIVE  NEGATIVE   Ketones, ur 40 (*) NEGATIVE mg/dL   Protein, ur NEGATIVE  NEGATIVE  mg/dL   Urobilinogen, UA 0.2  0.0 - 1.0 mg/dL   Nitrite NEGATIVE  NEGATIVE   Leukocytes, UA MODERATE (*) NEGATIVE  PREGNANCY, URINE      Result Value Range   Preg Test, Ur NEGATIVE  NEGATIVE  URINE MICROSCOPIC-ADD ON      Result Value Range   Squamous Epithelial / LPF MANY (*) RARE   WBC, UA 7-10  <3 WBC/hpf   RBC / HPF 3-6  <3 RBC/hpf   Bacteria, UA MANY (*) RARE   Urine-Other MUCOUS  PRESENT     Patient given 2 L of normal saline here. Her vital signs remained normal. She has normal CBC and see mat. Her urine shows 7-10 white blood cells and 3-6 red blood cells with many bacteria but is nitrite negative patient does not have signs of urinary tract infection by history. She has no vomiting, diarrhea, or elevated fever here. She is discharged home and advised to followup with primary care.        Hilario Quarry, MD 07/05/12 1524

## 2012-07-04 NOTE — ED Notes (Signed)
MD at bedside. 

## 2012-07-05 NOTE — ED Provider Notes (Signed)
Medical screening examination/treatment/procedure(s) were performed by non-physician practitioner and as supervising physician I was immediately available for consultation/collaboration.   Suzi Roots, MD 07/05/12 2125

## 2012-07-06 LAB — URINE CULTURE: Colony Count: >=100000

## 2012-09-13 ENCOUNTER — Encounter (HOSPITAL_BASED_OUTPATIENT_CLINIC_OR_DEPARTMENT_OTHER): Payer: Self-pay | Admitting: *Deleted

## 2012-09-13 ENCOUNTER — Emergency Department (HOSPITAL_BASED_OUTPATIENT_CLINIC_OR_DEPARTMENT_OTHER)
Admission: EM | Admit: 2012-09-13 | Discharge: 2012-09-13 | Disposition: A | Discharge status: Home / Self Care | Payer: Medicare Other | Attending: Emergency Medicine | Admitting: Emergency Medicine

## 2012-09-13 DIAGNOSIS — R110 Nausea: Secondary | ICD-10-CM | POA: Insufficient documentation

## 2012-09-13 DIAGNOSIS — F411 Generalized anxiety disorder: Secondary | ICD-10-CM | POA: Insufficient documentation

## 2012-09-13 DIAGNOSIS — F319 Bipolar disorder, unspecified: Secondary | ICD-10-CM | POA: Insufficient documentation

## 2012-09-13 DIAGNOSIS — J45909 Unspecified asthma, uncomplicated: Secondary | ICD-10-CM | POA: Insufficient documentation

## 2012-09-13 DIAGNOSIS — R5383 Other fatigue: Secondary | ICD-10-CM | POA: Insufficient documentation

## 2012-09-13 DIAGNOSIS — R5381 Other malaise: Secondary | ICD-10-CM | POA: Insufficient documentation

## 2012-09-13 DIAGNOSIS — Z79899 Other long term (current) drug therapy: Secondary | ICD-10-CM | POA: Insufficient documentation

## 2012-09-13 DIAGNOSIS — G43909 Migraine, unspecified, not intractable, without status migrainosus: Secondary | ICD-10-CM | POA: Insufficient documentation

## 2012-09-13 DIAGNOSIS — G4700 Insomnia, unspecified: Secondary | ICD-10-CM | POA: Insufficient documentation

## 2012-09-13 MED ORDER — ZOLPIDEM TARTRATE 5 MG PO TABS: 5.0000 mg | ORAL_TABLET | Freq: Every evening | ORAL | Status: DC | PRN

## 2012-09-13 NOTE — ED Provider Notes (Signed)
History     CSN: [id number]  Arrival date & time 09/13/12  1341   First MD Initiated Contact with Patient 09/13/12 1408      Chief Complaint  Patient presents with  . Insomnia    (Consider location/radiation/quality/duration/timing/severity/associated sxs/prior treatment) HPI Comments: Patient with history of Bipolar, Depression, presents with complaints of not sleeping for the past five days.  She was seen at [facility] yesterday and given vistaril but that has not helped.  She denies excessive caffeine intake.  She reports feeling nauseated and weak.  She denies any HI or SI.  No AH or VH.  The history is provided by the patient.    Past Medical History  Diagnosis Date  . Bipolar 1 disorder   . Asthma   . Migraine   . Anxiety     Past Surgical History  Procedure Laterality Date  . Knee surgery      History reviewed. No pertinent family history.  History  Substance Use Topics  . Smoking status: Never Smoker   . Smokeless tobacco: Not on file  . Alcohol Use: No    OB History   Grav Para Term Preterm Abortions TAB SAB Ect Mult Living                  Review of Systems  All other systems reviewed and are negative.    Allergies  Almotriptan malate; Zomig; Cymbalta; and Imitrex  Home Medications   Current Outpatient Rx  Name  Route  Sig  Dispense  Refill  . albuterol (PROVENTIL HFA;VENTOLIN HFA) 108 (90 BASE) MCG/ACT inhaler   Inhalation   Inhale 2 puffs into the lungs every 6 (six) hours as needed. Shortness of breath         . ARIPiprazole (ABILIFY) 5 MG tablet   Oral   Take 5 mg by mouth at bedtime. Patient uses this medication to aid with sleep.         . clonazePAM (KLONOPIN) 1 MG tablet   Oral   Take 1 mg by mouth daily as needed. For anxiety         . HYDROcodone-acetaminophen (NORCO/VICODIN) 5-325 MG per tablet   Oral   Take 1 tablet by mouth every 6 (six) hours as needed for pain.   15 tablet   0   . ibuprofen (ADVIL,MOTRIN) 800 MG  tablet   Oral   Take 1 tablet (800 mg total) by mouth every 8 (eight) hours as needed for pain.   21 tablet   0   . lamoTRIgine (LAMICTAL) 25 MG tablet   Oral   Take 25 mg by mouth at bedtime. Anti-seizure medication. Consult needed.         . ondansetron (ZOFRAN ODT) 8 MG disintegrating tablet   Oral   Take 1 tablet (8 mg total) by mouth every 8 (eight) hours as needed for nausea.   20 tablet   0   . promethazine (PHENERGAN) 25 MG tablet   Oral   Take 25 mg by mouth every 6 (six) hours as needed for nausea.         . traZODone (DESYREL) 100 MG tablet   Oral   Take 100 mg by mouth at bedtime.             BP 129/73  Pulse 110  Temp(Src) 98.2 F (36.8 C) (Oral)  Ht 5\' 4"  (1.626 m)  Wt 190 lb (86.183 kg)  BMI 32.6 kg/m2  SpO2 100%  Physical  Exam  Nursing note and vitals reviewed. Constitutional: She is oriented to person, place, and time. She appears well-developed and well-nourished. No distress.  HENT:  Head: Normocephalic and atraumatic.  Eyes: EOM are normal. Pupils are equal, round, and reactive to light.  Neck: Normal range of motion. Neck supple.  Cardiovascular: Normal rate and regular rhythm.  Exam reveals no gallop and no friction rub.   No murmur heard. Pulmonary/Chest: Effort normal and breath sounds normal. No respiratory distress. She has no wheezes.  Abdominal: Soft. Bowel sounds are normal. She exhibits no distension. There is no tenderness.  Musculoskeletal: Normal range of motion.  Neurological: She is alert and oriented to person, place, and time.  Skin: Skin is warm and dry. She is not diaphoretic.    ED Course  Procedures (including critical care time)  Labs Reviewed - No data to display No results found.   No diagnosis found.    MDM  Will prescribe a few (3) ambien.  She needs to discuss this with her pcp.        Geoffery Lyons, MD 09/13/12 1426

## 2012-09-13 NOTE — ED Notes (Signed)
Has been unable to sleep since Saturday has insomnia but never been this bad states feels nauseated went to urgent care yesterday given vistaril told if didn't work go to er today

## 2012-09-13 NOTE — ED Notes (Signed)
Pt reports she is unable to sleep and hasn't slept in 4 days.  Took Vistaril and OTC sleep medication without relief.  C/O fatigue and irritability.

## 2013-03-09 ENCOUNTER — Ambulatory Visit (INDEPENDENT_AMBULATORY_CARE_PROVIDER_SITE_OTHER): Payer: Medicare Other | Admitting: Emergency Medicine

## 2013-03-09 VITALS — BP 120/80 | HR 85 | Temp 98.0°F | Resp 16 | Ht 64.0 in | Wt 195.0 lb

## 2013-03-09 DIAGNOSIS — H109 Unspecified conjunctivitis: Secondary | ICD-10-CM

## 2013-03-09 DIAGNOSIS — R51 Headache: Secondary | ICD-10-CM

## 2013-03-09 MED ORDER — OFLOXACIN 0.3 % OP SOLN: 1.0000 [drp] | OPHTHALMIC | Status: DC

## 2013-03-09 MED ORDER — TRAMADOL HCL 50 MG PO TABS: 50.0000 mg | ORAL_TABLET | Freq: Three times a day (TID) | ORAL | Status: DC | PRN

## 2013-03-09 NOTE — Progress Notes (Signed)
Subjective:  This chart was scribed for Stacey Chris, MD by [name], Medical Scribe. This patient was seen in Room 3 and the patient's care was started at 8:15 AM.   Patient ID: Stacey Weaver, female    DOB: April 20, 1975, 37 y.o.   MRN: [id number]  HPI HPI Comments: Stacey Weaver is a 37 y.o. female with a history of migraines who presents to the [facility] complaining of a constant migraine located over the left eye that started three days ago.  The patient states that she has taken OTC Excedrin Migraine and 800 mg Motrin with no relief to her symptoms.  She states that she normally takes Tramadol for her migraines but states that she ran out of medication.  She states that the Tramadol works well for her migraines.  She states that she does not know what causes her migraines.  She states that her cousin also has migraines.  She states that her neurologist is Dr. Threasa Beards.  She is also complaining of conjunctivitis in her left eye that started this morning.  She states that she normally wears contacts but took them out this morning after noticed the conjunctivitis.  She states that the left eye is burning and tearing up.  She denies cough and rhinorrhea as associated symptoms.  She states that aside from her current symptoms, she feels fine physically.    She states that she is allergic to Celexa, Imitrex, and Zomig.  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  She states that she has been working with a lot of pink eye cases at work.  She states that she has go to back to work tomorrow.    Past Medical History  Diagnosis Date  . Bipolar 1 disorder   . Asthma   . Migraine   . Anxiety    Past Surgical History  Procedure Laterality Date  . Knee surgery     No family history on file. History   Social History  . Marital Status: Single    Spouse Name: N/A    Number of Children: N/A  . Years of Education: N/A   Occupational  History  . Not on file.   Social History Main Topics  . Smoking status: Never Smoker   . Smokeless tobacco: Not on file  . Alcohol Use: No  . Drug Use: No  . Sexual Activity: Yes    Birth Control/ Protection: IUD   Other Topics Concern  . Not on file   Social History Narrative  . No narrative on file   Allergies  Allergen Reactions  . Almotriptan Malate Shortness Of Breath  . Zomig Shortness Of Breath  . Cymbalta [Duloxetine Hcl]     suicidal thoughts  . Imitrex [Sumatriptan]      Review of Systems  HENT: Negative for rhinorrhea.   Eyes: Positive for pain (left eye).  Respiratory: Negative for cough.   Neurological: Positive for headaches.  All other systems reviewed and are negative.    Objective:  Physical Exam Physical Exam  Nursing note and vitals reviewed. Constitutional: He is oriented to person, place, and time. He appears well-developed and well-nourished. No distress.  HENT:  Head: Normocephalic and atraumatic.  Eyes: EOM are normal.  there is mild injection around the corneal scleral junction. Lid was everted and swabbed no foreign body was seen. 2 drops of Pontocaine were instilled. Since the old and there was no  uptake. Neck: Neck supple. No tracheal deviation present.  Cardiovascular: Normal rate.   Pulmonary/Chest: Effort normal. No respiratory distress.  Musculoskeletal: Normal range of motion.  Neurological: He is alert and oriented to person, place, and time.  Skin: Skin is warm and dry.  Psychiatric: He has a normal mood and affect. His behavior is normal.       Assessment & Plan:  We'll treat with Ocuflox for the eye irritation. She was given a limited prescription of Ultram for her headaches. She will followup with Dr. Threasa Beards in if this continues to be a problem  I personally performed the services described in this documentation, which was scribed in my presence. The recorded information has been reviewed and is accurate.

## 2013-03-13 IMAGING — US US PELVIS LIMITED
1 series · 13 of 25 positions shown · non-contrast
Comparison: None.

CLINICAL DATA: Bilateral adnexal tenderness, evaluate for torsion,
vaginal bleeding, IUD

TRANSABDOMINAL AND TRANSVAGINAL ULTRASOUND OF PELVIS
DOPPLER ULTRASOUND OF OVARIES
TECHNIQUE: Both transabdominal and transvaginal ultrasound
examinations of the pelvis were performed. Transabdominal technique
was performed for global imaging of the pelvis including uterus,
ovaries, adnexal regions, and pelvic cul-de-sac.
It was necessary to proceed with endovaginal exam following the
transabdominal exam to visualize the bilateral ovaries.
Color and duplex Doppler ultrasound was utilized to evaluate blood
flow to the ovaries.

[Series 1: us pelvis limited · 0.30mm/px · 13 of 54 slices shown]
[im 1/54]
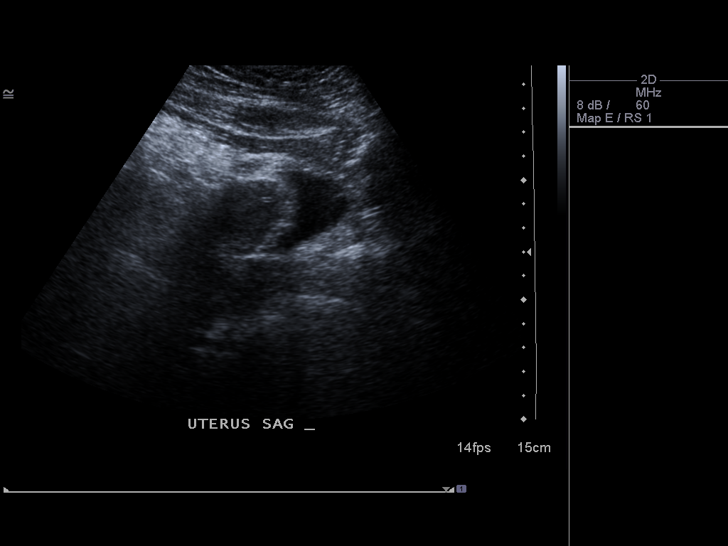
[im 5/54]
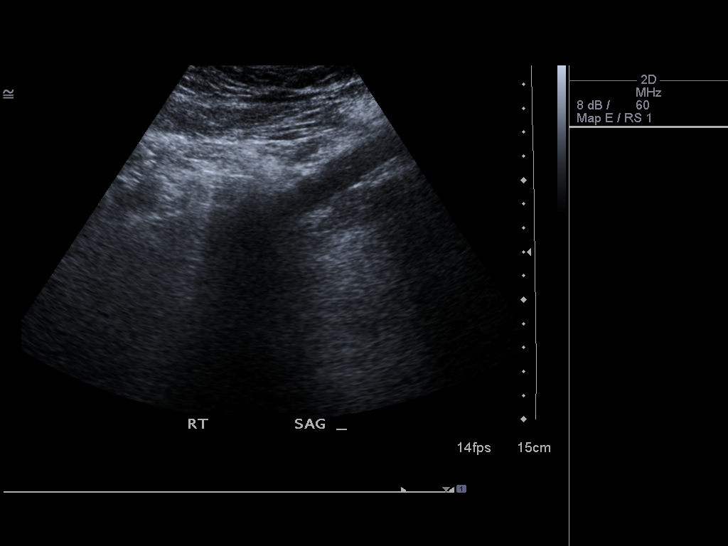
[im 9/54]
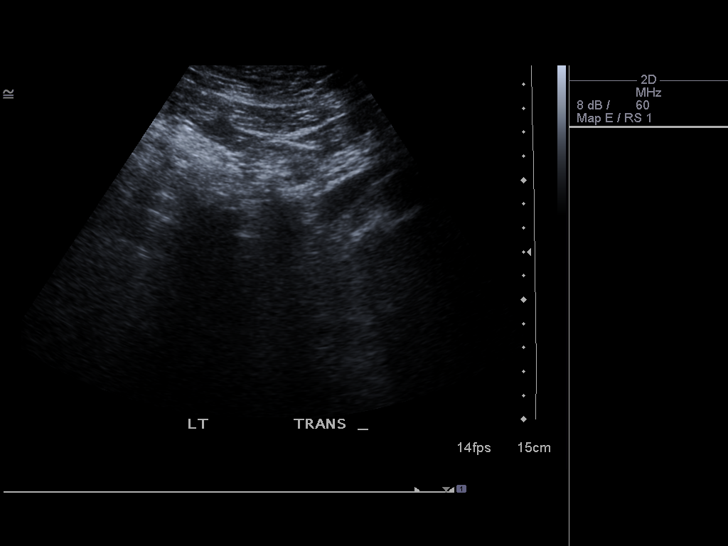
[im 14/54]
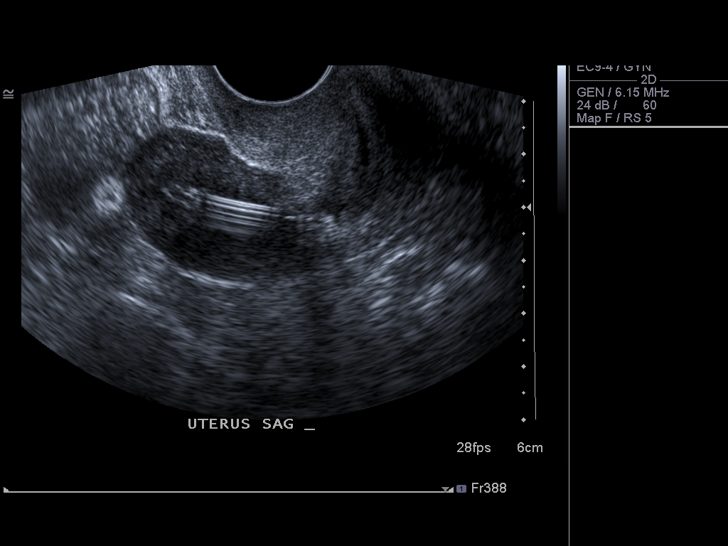
[im 18/54]
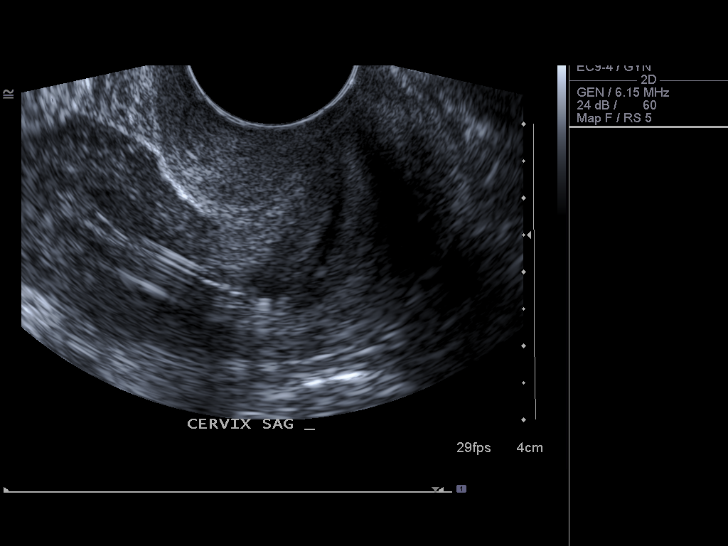
[im 23/54]
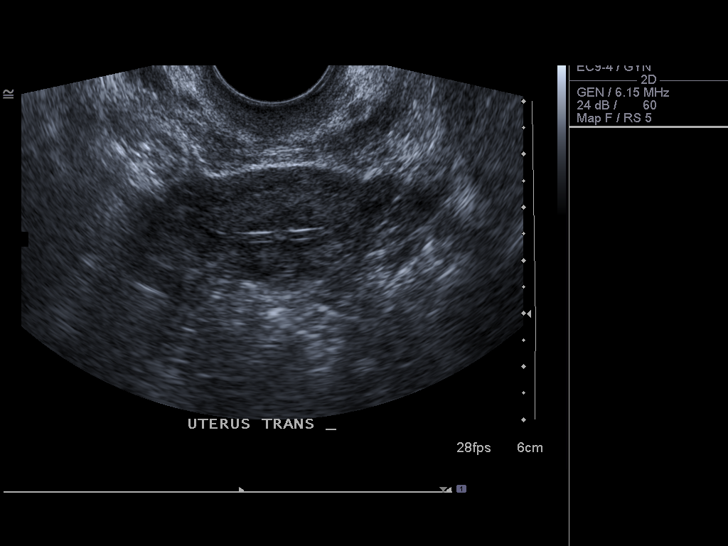
[im 27/54]
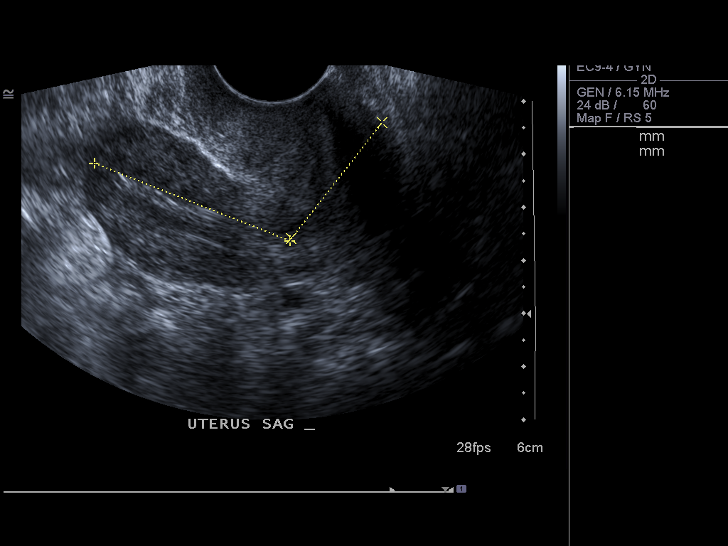
[im 31/54]
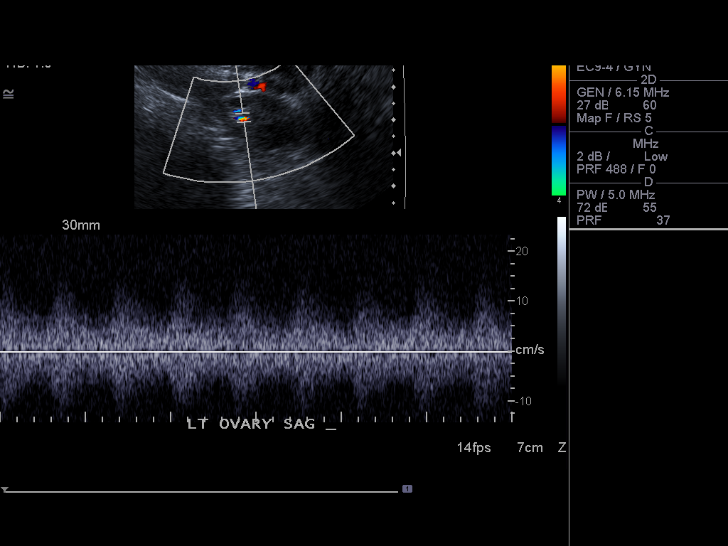
[im 36/54]
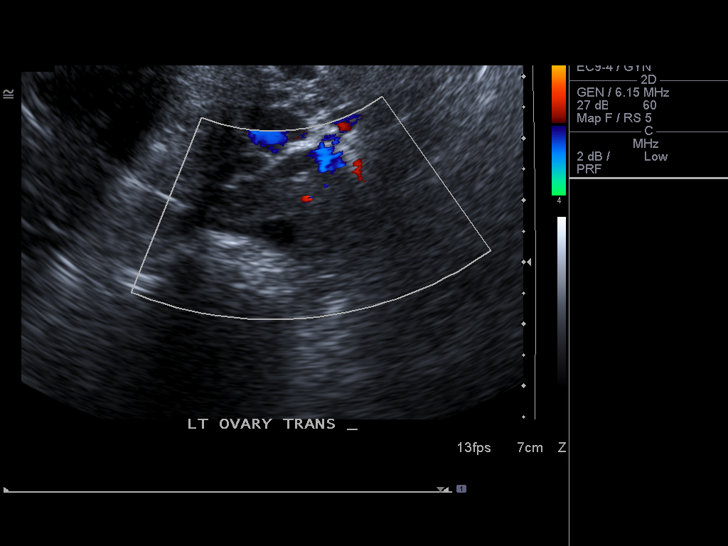
[im 40/54]
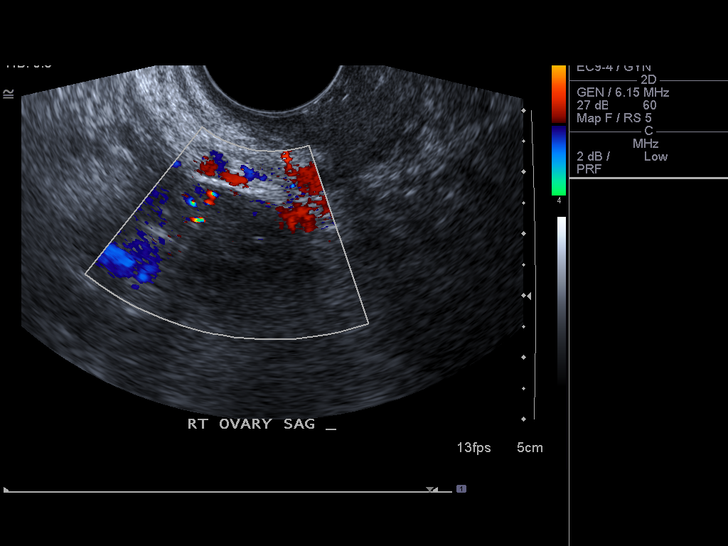
[im 45/54]
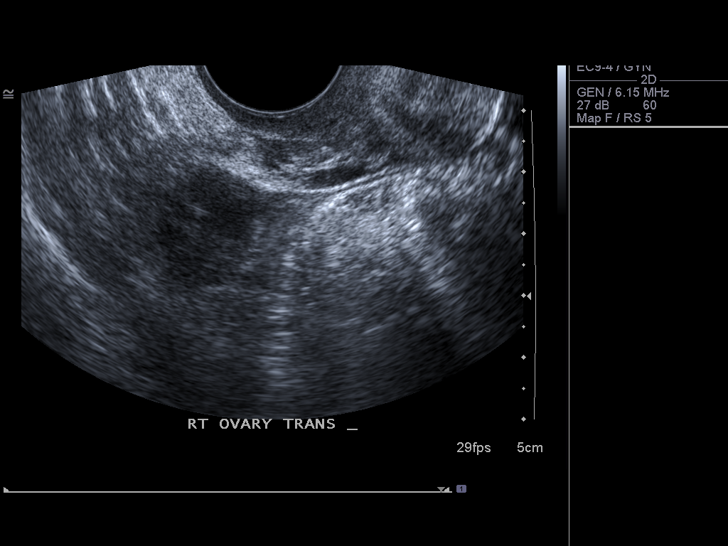
[im 49/54]
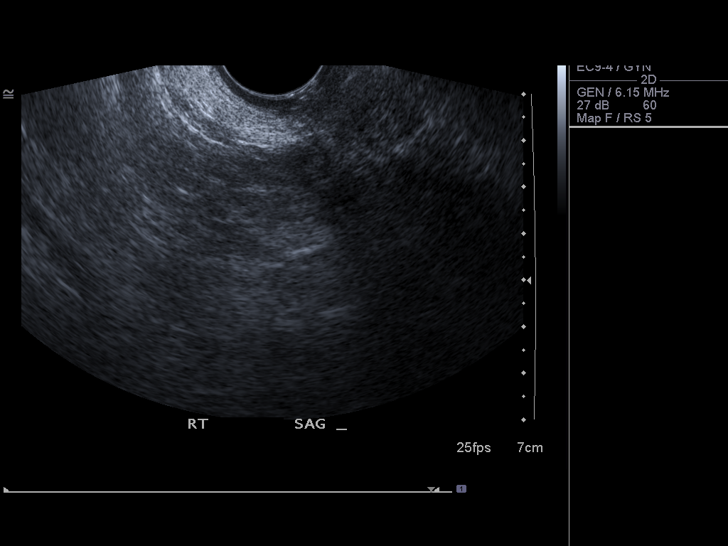
[im 54/54]
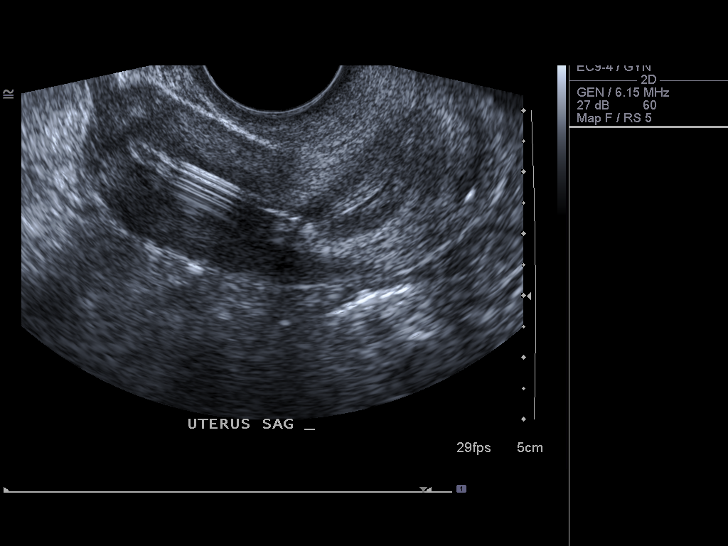

[13 of 25 positions shown; findings below may reference images not displayed]

FINDINGS: Uterus:  Normal in size and appearance, measuring 6.7 x 2.4 x
cm.

Endometrium:  Normal in thickness, measuring 3 mm.  IUD in
satisfactory position.  Small amount of fluid in the lower uterine
segment

Right ovary: Normal appearance/no adnexal mass, measuring 2.9 x
x 1.7 cm.

Left ovary:   Normal appearance/no adnexal mass, measuring 2.6 x
1.8 x 2.1 cm.

Pulsed Doppler evaluation demonstrates normal low-resistance
arterial and venous waveforms in both ovaries.
IMPRESSION: Normal exam.  No evidence of pelvic mass or other significant
abnormality.

No sonographic evidence for ovarian torsion.

IUD in satisfactory position.

## 2013-04-03 ENCOUNTER — Ambulatory Visit (INDEPENDENT_AMBULATORY_CARE_PROVIDER_SITE_OTHER): Payer: Medicare PPO | Admitting: Family Medicine

## 2013-04-03 VITALS — BP 118/68 | HR 110 | Temp 98.6°F | Resp 16 | Ht 64.0 in | Wt 197.0 lb

## 2013-04-03 DIAGNOSIS — M62838 Other muscle spasm: Secondary | ICD-10-CM

## 2013-04-03 DIAGNOSIS — IMO0001 Reserved for inherently not codable concepts without codable children: Secondary | ICD-10-CM

## 2013-04-03 DIAGNOSIS — M7918 Myalgia, other site: Secondary | ICD-10-CM

## 2013-04-03 MED ORDER — MELOXICAM 15 MG PO TABS: 15.0000 mg | ORAL_TABLET | Freq: Every day | ORAL | Status: DC

## 2013-04-03 MED ORDER — CYCLOBENZAPRINE HCL 10 MG PO TABS: 10.0000 mg | ORAL_TABLET | Freq: Three times a day (TID) | ORAL | Status: DC | PRN

## 2013-04-03 NOTE — Patient Instructions (Signed)
I recommend keeping ibuprofen on board - esp taking before and during work. Then when you come home, take a muscle relaxant followed by 15 minutes of heat followed by gentle stretching. Try to do this regiment 2 - 3 times a day.  If you are still having pain in 4 to 6 wks, come back to clinic for further eval. RTC immed if symptoms worsen or you develop any other concerning symptoms below.

## 2013-04-03 NOTE — Progress Notes (Signed)
This chart was scribed for Norberto Sorenson, MD by Luisa Dago, ED Scribe. This patient was seen in room Room/bed info not found and the patient's care was started at 3:30 PM. Subjective:    Patient ID: Stacey Weaver, female    DOB: 1975-08-24, 38 y.o.   MRN: [id number]  Chief Complaint  Patient presents with  . Back Pain    mid back pain x 5 days denies injury denies any urinary symptoms    HPI HPI Comments: Stacey Weaver is a 38 y.o. female who presents to [facility] complaining of constant Bilateral thoracic back pain medial to bilateral scapula that started 5 days ago. She denies any injury to her back. She reports taking Motrin, Aleve, and a heat pack with no relief. She says that she noticed that when she sits for long periods of time her back muscles feels tight. She denies any urinary symptoms, neck pain, numbness, or paresthesia. Pt denies any mattress changes but she thinks that her mattress is old and needs to be changed. Pt does not exercise.   Past Medical History  Diagnosis Date  . Bipolar 1 disorder   . Asthma   . Migraine   . Anxiety    Allergies  Allergen Reactions  . Almotriptan Malate Shortness Of Breath  . Zomig Shortness Of Breath  . Cymbalta [Duloxetine Hcl]     suicidal thoughts  . Imitrex [Sumatriptan]    Current Outpatient Prescriptions on File Prior to Visit  Medication Sig Dispense Refill  . albuterol (PROVENTIL HFA;VENTOLIN HFA) 108 (90 BASE) MCG/ACT inhaler Inhale 2 puffs into the lungs every 6 (six) hours as needed. Shortness of breath      . ARIPiprazole (ABILIFY) 5 MG tablet Take 5 mg by mouth at bedtime. Patient uses this medication to aid with sleep.      . clonazePAM (KLONOPIN) 1 MG tablet Take 1 mg by mouth daily as needed. For anxiety      . ibuprofen (ADVIL,MOTRIN) 800 MG tablet Take 1 tablet (800 mg total) by mouth every 8 (eight) hours as needed for pain.  21 tablet  0  . lamoTRIgine (LAMICTAL) 25 MG tablet Take 25 mg by  mouth at bedtime. Anti-seizure medication. Consult needed.      [name] ofloxacin (OCUFLOX) 0.3 % ophthalmic solution Place 1 drop into the right eye every 4 (four) hours.  5 mL  0  . traZODone (DESYREL) 100 MG tablet Take 100 mg by mouth at bedtime.        [name] zolpidem (AMBIEN) 5 MG tablet Take 1 tablet (5 mg total) by mouth at bedtime as needed for sleep.  3 tablet  0  . HYDROcodone-acetaminophen (NORCO/VICODIN) 5-325 MG per tablet Take 1 tablet by mouth every 6 (six) hours as needed for pain.  15 tablet  0  . ondansetron (ZOFRAN ODT) 8 MG disintegrating tablet Take 1 tablet (8 mg total) by mouth every 8 (eight) hours as needed for nausea.  20 tablet  0  . promethazine (PHENERGAN) 25 MG tablet Take 25 mg by mouth every 6 (six) hours as needed for nausea.      . traMADol (ULTRAM) 50 MG tablet Take 1 tablet (50 mg total) by mouth every 8 (eight) hours as needed.  15 tablet  0   No current facility-administered medications on file prior to visit.    Review of Systems  Constitutional: Negative for fever and chills.  Gastrointestinal: Negative for nausea, vomiting, abdominal pain, diarrhea  and constipation.  Genitourinary: Negative for dysuria, urgency, frequency, decreased urine volume and difficulty urinating.  Musculoskeletal: Positive for back pain and myalgias. Negative for arthralgias, gait problem, joint swelling, neck pain and neck stiffness.  Skin: Negative for rash.  Neurological: Negative for weakness and numbness.  Hematological: Negative for adenopathy. Does not bruise/bleed easily.  Psychiatric/Behavioral: Positive for sleep disturbance.      Triage Vitals: BP 118/68  Pulse 110  Temp(Src) 98.6 F (37 C) (Oral)  Resp 16  Ht 5\' 4"  (1.626 m)  Wt 197 lb (89.359 kg)  BMI 33.80 kg/m2  SpO2 100%  Objective:   Physical Exam  Nursing note and vitals reviewed. Constitutional: She is oriented to person, place, and time. She appears well-developed and well-nourished.  HENT:  Head:  Normocephalic and atraumatic.  Cardiovascular: Normal rate.   Pulmonary/Chest: Effort normal.  Abdominal: She exhibits no distension.  Musculoskeletal:       Thoracic back: She exhibits decreased range of motion, tenderness, pain and spasm. She exhibits no bony tenderness, no swelling and no deformity.  Mildly reduced thoracic ROM.   Neurological: She is alert and oriented to person, place, and time.  Skin: Skin is warm and dry.  Psychiatric: She has a normal mood and affect.      Assessment & Plan:   Rhomboid myalgia  Trapezius muscle spasm 3:56 PM- Advised pt on stretches she can do sev time daily to reduce the tightness of her back pain. Rec heat and stretching sev times daily. If cont, RTC to cons imaging and PT. Pt advised of plan for treatment and pt agrees. Meds ordered this encounter  Medications  . cyclobenzaprine (FLEXERIL) 10 MG tablet    Sig: Take 1 tablet (10 mg total) by mouth 3 (three) times daily as needed for muscle spasms.    Dispense:  30 tablet    Refill:  0  . meloxicam (MOBIC) 15 MG tablet    Sig: Take 1 tablet (15 mg total) by mouth daily.    Dispense:  30 tablet    Refill:  1    I personally performed the services described in this documentation, which was scribed in my presence. The recorded information has been reviewed and considered, and addended by me as needed.  Norberto SorensonEva Koua Deeg, MD MPH

## 2013-05-10 ENCOUNTER — Emergency Department (HOSPITAL_COMMUNITY)
Admission: EM | Admit: 2013-05-10 | Discharge: 2013-05-10 | Disposition: A | Discharge status: Home / Self Care | Payer: Medicare PPO | Attending: Emergency Medicine | Admitting: Emergency Medicine

## 2013-05-10 ENCOUNTER — Encounter (HOSPITAL_COMMUNITY): Payer: Self-pay | Admitting: Emergency Medicine

## 2013-05-10 DIAGNOSIS — F411 Generalized anxiety disorder: Secondary | ICD-10-CM | POA: Insufficient documentation

## 2013-05-10 DIAGNOSIS — J45909 Unspecified asthma, uncomplicated: Secondary | ICD-10-CM | POA: Insufficient documentation

## 2013-05-10 DIAGNOSIS — G43909 Migraine, unspecified, not intractable, without status migrainosus: Secondary | ICD-10-CM | POA: Insufficient documentation

## 2013-05-10 DIAGNOSIS — R45851 Suicidal ideations: Secondary | ICD-10-CM | POA: Insufficient documentation

## 2013-05-10 DIAGNOSIS — Z3202 Encounter for pregnancy test, result negative: Secondary | ICD-10-CM | POA: Insufficient documentation

## 2013-05-10 DIAGNOSIS — Z79899 Other long term (current) drug therapy: Secondary | ICD-10-CM | POA: Insufficient documentation

## 2013-05-10 DIAGNOSIS — F32A Depression, unspecified: Secondary | ICD-10-CM

## 2013-05-10 DIAGNOSIS — F3130 Bipolar disorder, current episode depressed, mild or moderate severity, unspecified: Secondary | ICD-10-CM | POA: Insufficient documentation

## 2013-05-10 DIAGNOSIS — F329 Major depressive disorder, single episode, unspecified: Secondary | ICD-10-CM

## 2013-05-10 LAB — CBC WITH DIFFERENTIAL/PLATELET
BASOS ABS: 0 10*3/uL (ref 0.0–0.1)
Basophils Relative: 1 % (ref 0–1)
Eosinophils Absolute: 0 10*3/uL (ref 0.0–0.7)
Eosinophils Relative: 1 % (ref 0–5)
HEMATOCRIT: 41.2 % (ref 36.0–46.0)
Hemoglobin: 13.2 g/dL (ref 12.0–15.0)
LYMPHS PCT: 33 % (ref 12–46)
Lymphs Abs: 2.5 10*3/uL (ref 0.7–4.0)
MCH: 28.1 pg (ref 26.0–34.0)
MCHC: 32 g/dL (ref 30.0–36.0)
MCV: 87.8 fL (ref 78.0–100.0)
MONO ABS: 0.7 10*3/uL (ref 0.1–1.0)
Monocytes Relative: 9 % (ref 3–12)
NEUTROS ABS: 4.4 10*3/uL (ref 1.7–7.7)
Neutrophils Relative %: 57 % (ref 43–77)
PLATELETS: 302 10*3/uL (ref 150–400)
RBC: 4.69 MIL/uL (ref 3.87–5.11)
RDW: 12.8 % (ref 11.5–15.5)
WBC: 7.7 10*3/uL (ref 4.0–10.5)

## 2013-05-10 LAB — COMPREHENSIVE METABOLIC PANEL
ALT: 8 U/L (ref 0–35)
AST: 16 U/L (ref 0–37)
Albumin: 4.2 g/dL (ref 3.5–5.2)
Alkaline Phosphatase: 84 U/L (ref 39–117)
BILIRUBIN TOTAL: 0.5 mg/dL (ref 0.3–1.2)
BUN: 8 mg/dL (ref 6–23)
CHLORIDE: 100 meq/L (ref 96–112)
CO2: 23 meq/L (ref 19–32)
Calcium: 9.9 mg/dL (ref 8.4–10.5)
Creatinine, Ser: 0.87 mg/dL (ref 0.50–1.10)
GFR calc Af Amer: >90 mL/min (ref >90)
GFR calc non Af Amer: 85 mL/min — ABNORMAL LOW (ref >90)
Glucose, Bld: 91 mg/dL (ref 70–99)
Potassium: 4.1 mEq/L (ref 3.7–5.3)
SODIUM: 137 meq/L (ref 137–147)
Total Protein: 8.4 g/dL — ABNORMAL HIGH (ref 6.0–8.3)

## 2013-05-10 LAB — POCT PREGNANCY, URINE: PREG TEST UR: NEGATIVE

## 2013-05-10 LAB — RAPID URINE DRUG SCREEN, HOSP PERFORMED
Amphetamines: NOT DETECTED
BARBITURATES: NOT DETECTED
Benzodiazepines: NOT DETECTED
Cocaine: NOT DETECTED
Opiates: NOT DETECTED
Tetrahydrocannabinol: NOT DETECTED

## 2013-05-10 LAB — ETHANOL

## 2013-05-10 MED ORDER — ACETAMINOPHEN 325 MG PO TABS
650.0000 mg | ORAL_TABLET | Freq: Once | ORAL | Status: AC
  Administered 2013-05-10: 650 mg via ORAL
  Filled 2013-05-10: qty 2

## 2013-05-10 NOTE — ED Provider Notes (Signed)
CSN: [id number]     Arrival date & time 05/10/13  1624 History   First MD Initiated Contact with Patient 05/10/13 1636     Chief Complaint  Patient presents with  . Depression  . Suicidal     (Consider location/radiation/quality/duration/timing/severity/associated sxs/prior Treatment) HPI  38 year old female with history of bipolar, anxiety, and depression presents for evaluations of " thoughts of death".  Patient states for the past 3 weeks she has had pervasive thoughts of death including visualizing car crash. States she feels depressed but does not know why. She thinks related to her history of bipolar although she is currently taking medication for that and is trying to live her normal lifestyle to help cope with her psychiatric illness. She has been working at her job but felt that her job performance has been suffering. Today patient states she had a mental meltdown and her boss brought her to the ER for further management. She also reports seeing her psychiatrist 2 days ago for this depression and was prescribed Zoloft in a very slow dose. She is starting to take the medication but felt he would need to take some time before it can be efficacious. She denies any active SI, HI, or hallucination. She denies self medicating with alcohol or recreational drugs. She denies having any physical ailment or pain aside from a mild headache. Patient has no other complaint. No prior history of suicide attempt.  Patient denies being pregnant.     Past Medical History  Diagnosis Date  . Bipolar 1 disorder   . Asthma   . Migraine   . Anxiety    Past Surgical History  Procedure Laterality Date  . Knee surgery     Family History  Problem Relation Age of Onset  . Hyperlipidemia Mother   . Hypertension Mother   . Heart disease Father   . Hyperlipidemia Maternal Grandmother   . Cancer Maternal Grandmother   . Cancer Maternal Grandfather    History  Substance Use Topics  . Smoking status:  Never Smoker   . Smokeless tobacco: Not on file  . Alcohol Use: No   OB History   Grav Para Term Preterm Abortions TAB SAB Ect Mult Living                 Review of Systems  All other systems reviewed and are negative.      Allergies  Almotriptan malate; Imitrex; Zomig; Cymbalta; Geodon; and Lactose intolerance (gi)  Home Medications   Current Outpatient Rx  Name  Route  Sig  Dispense  Refill  . albuterol (PROVENTIL HFA;VENTOLIN HFA) 108 (90 BASE) MCG/ACT inhaler   Inhalation   Inhale 2 puffs into the lungs every 6 (six) hours as needed. Shortness of breath         . ARIPiprazole (ABILIFY) 10 MG tablet   Oral   Take 10 mg by mouth at bedtime.         . clonazePAM (KLONOPIN) 0.5 MG tablet   Oral   Take 0.5 mg by mouth daily as needed for anxiety.         . diphenhydrAMINE (BENADRYL) 25 mg capsule   Oral   Take 50 mg by mouth daily as needed for allergies.         [name]. etonogestrel (NEXPLANON) 68 MG IMPL implant   Subcutaneous   Inject 1 each into the skin once.         . lamoTRIgine (LAMICTAL) 25 MG tablet   Oral  Take 25 mg by mouth at bedtime. Anti-seizure medication. Consult needed.         . sertraline (ZOLOFT) 25 MG tablet   Oral   Take 25 mg by mouth daily after breakfast.         . traZODone (DESYREL) 100 MG tablet   Oral   Take 100 mg by mouth at bedtime.           [name] zolpidem (AMBIEN) 10 MG tablet   Oral   Take 10 mg by mouth at bedtime as needed for sleep.          BP 137/74  Pulse 99  Temp(Src) 98.1 F (36.7 C) (Oral)  Resp 18  SpO2 100% Physical Exam  Nursing note and vitals reviewed. Constitutional: She appears well-developed and well-nourished. No distress.  Awake, alert, nontoxic appearance  HENT:  Head: Atraumatic.  Eyes: Conjunctivae are normal. Right eye exhibits no discharge. Left eye exhibits no discharge.  Neck: Neck supple.  Cardiovascular: Normal rate and regular rhythm.   Pulmonary/Chest: Effort normal.  No respiratory distress. She exhibits no tenderness.  Abdominal: Soft. There is no tenderness. There is no rebound.  Musculoskeletal: She exhibits no tenderness.  ROM appears intact, no obvious focal weakness  Neurological:  Mental status and motor strength appears intact  Skin: No rash noted.  Psychiatric: She has a normal mood and affect. Her speech is normal and behavior is normal. Judgment and thought content normal. Thought content is not paranoid and not delusional. Cognition and memory are normal. She expresses no homicidal and no suicidal ideation. She expresses no suicidal plans and no homicidal plans.  Patient is calm and cooperative with good eye contact, speech is goal oriented.     ED Course  Procedures (including critical care time)  6:22 PM Pt here with depression and having pervasive "thought of death" without SI/HI/hallucination.  No plan.  Hx of bipolar.  Will perform medical screen.  Pt likely does not meet admission criteria.  Care discussed with Dr. Deretha Emory.  7:11 PM Pt is medically cleared.  No active SI/HI, stable for discharge with close f/u with personal psychiatrist for further management.  Work note provided.  Return precaution discussed.   Labs Review Labs Reviewed  COMPREHENSIVE METABOLIC PANEL - Abnormal; Notable for the following:    Total Protein 8.4 (*)    GFR calc non Af Amer 85 (*)    All other components within normal limits  CBC WITH DIFFERENTIAL  ETHANOL  URINE RAPID DRUG SCREEN (HOSP PERFORMED)  POCT PREGNANCY, URINE   Imaging Review No results found.  EKG Interpretation   None       MDM   Final diagnoses:  Depression   BP 137/74  Pulse 99  Temp(Src) 98.1 F (36.7 C) (Oral)  Resp 18  SpO2 100%  I have reviewed nursing notes and vital signs. I reviewed available ER/hospitalization records thought the EMR     Fayrene Helper, [place] 05/10/13 1610

## 2013-05-10 NOTE — ED Provider Notes (Signed)
Medical screening examination/treatment/procedure(s) were conducted as a shared visit with non-physician practitioner(s) and myself.  I personally evaluated the patient during the encounter.  EKG Interpretation   None      Results for orders placed during the hospital encounter of 05/10/13  CBC WITH DIFFERENTIAL      Result Value Ref Range   WBC 7.7  4.0 - 10.5 K/uL   RBC 4.69  3.87 - 5.11 MIL/uL   Hemoglobin 13.2  12.0 - 15.0 g/dL   HCT 11.941.2  14.736.0 - 82.946.0 %   MCV 87.8  78.0 - 100.0 fL   MCH 28.1  26.0 - 34.0 pg   MCHC 32.0  30.0 - 36.0 g/dL   RDW 56.212.8  13.011.5 - 86.515.5 %   Platelets 302  150 - 400 K/uL   Neutrophils Relative % 57  43 - 77 %   Neutro Abs 4.4  1.7 - 7.7 K/uL   Lymphocytes Relative 33  12 - 46 %   Lymphs Abs 2.5  0.7 - 4.0 K/uL   Monocytes Relative 9  3 - 12 %   Monocytes Absolute 0.7  0.1 - 1.0 K/uL   Eosinophils Relative 1  0 - 5 %   Eosinophils Absolute 0.0  0.0 - 0.7 K/uL   Basophils Relative 1  0 - 1 %   Basophils Absolute 0.0  0.0 - 0.1 K/uL  COMPREHENSIVE METABOLIC PANEL      Result Value Ref Range   Sodium 137  137 - 147 mEq/L   Potassium 4.1  3.7 - 5.3 mEq/L   Chloride 100  96 - 112 mEq/L   CO2 23  19 - 32 mEq/L   Glucose, Bld 91  70 - 99 mg/dL   BUN 8  6 - 23 mg/dL   Creatinine, Ser 7.840.87  0.50 - 1.10 mg/dL   Calcium 9.9  8.4 - 69.610.5 mg/dL   Total Protein 8.4 (*) 6.0 - 8.3 g/dL   Albumin 4.2  3.5 - 5.2 g/dL   AST 16  0 - 37 U/L   ALT 8  0 - 35 U/L   Alkaline Phosphatase 84  39 - 117 U/L   Total Bilirubin 0.5  0.3 - 1.2 mg/dL   GFR calc non Af Amer 85 (*) >90 mL/min   GFR calc Af Amer >90  >90 mL/min  ETHANOL      Result Value Ref Range   Alcohol, Ethyl (B) <11  0 - 11 mg/dL  POCT PREGNANCY, URINE      Result Value Ref Range   Preg Test, Ur NEGATIVE  NEGATIVE   Patient brought in by a coworker. Patient's been having trouble with increasing depression over the past few weeks. Senescent thoughts of death but has no specific suicide plan. She assures  us here that she has no intent of committing suicide. She does have a psychiatrist recently was started on Zoloft does have ability to arrange for followup. Patient appears appropriate has good eye contact. I feel the patient is safe for discharge home.  Shelda JakesScott W. Jashayla Glatfelter, MD 05/10/13 [phone]

## 2013-05-10 NOTE — ED Notes (Signed)
Pt given meal tray.  tol well at this time.  Pt c/o 3/10 diffuse HA, EDPA notified. Awaiting orders.

## 2013-05-10 NOTE — Discharge Instructions (Signed)
Please follow up with your psychiatrist for further management of your depression.  Please take the next few days off from work to recuperate.  Return to ER if you have active suicidal thoughts or if you have other concerns.    Depression, Adult Depression refers to feeling sad, low, down in the dumps, blue, gloomy, or empty. In general, there are two kinds of depression: 1. Depression that we all experience from time to time because of upsetting life experiences, including the loss of a job or the ending of a relationship (normal sadness or normal grief). This kind of depression is considered normal, is short lived, and resolves within a few days to 2 weeks. (Depression experienced after the loss of a loved one is called bereavement. Bereavement often lasts longer than 2 weeks but normally gets better with time.) 2. Clinical depression, which lasts longer than normal sadness or normal grief or interferes with your ability to function at home, at work, and in school. It also interferes with your personal relationships. It affects almost every aspect of your life. Clinical depression is an illness. Symptoms of depression also can be caused by conditions other than normal sadness and grief or clinical depression. Examples of these conditions are listed as follows:  Physical illness Some physical illnesses, including underactive thyroid gland (hypothyroidism), severe anemia, specific types of cancer, diabetes, uncontrolled seizures, heart and lung problems, strokes, and chronic pain are commonly associated with symptoms of depression.  Side effects of some prescription medicine In some people, certain types of prescription medicine can cause symptoms of depression.  Substance abuse Abuse of alcohol and illicit drugs can cause symptoms of depression. SYMPTOMS Symptoms of normal sadness and normal grief include the following:  Feeling sad or crying for short periods of time.  Not caring about anything  (apathy).  Difficulty sleeping or sleeping too much.  No longer able to enjoy the things you used to enjoy.  Desire to be by oneself all the time (social isolation).  Lack of energy or motivation.  Difficulty concentrating or remembering.  Change in appetite or weight.  Restlessness or agitation. Symptoms of clinical depression include the same symptoms of normal sadness or normal grief and also the following symptoms:  Feeling sad or crying all the time.  Feelings of guilt or worthlessness.  Feelings of hopelessness or helplessness.  Thoughts of suicide or the desire to harm yourself (suicidal ideation).  Loss of touch with reality (psychotic symptoms). Seeing or hearing things that are not real (hallucinations) or having false beliefs about your life or the people around you (delusions and paranoia). DIAGNOSIS  The diagnosis of clinical depression usually is based on the severity and duration of the symptoms. Your caregiver also will ask you questions about your medical history and substance use to find out if physical illness, use of prescription medicine, or substance abuse is causing your depression. Your caregiver also may order blood tests. TREATMENT  Typically, normal sadness and normal grief do not require treatment. However, sometimes antidepressant medicine is prescribed for bereavement to ease the depressive symptoms until they resolve. The treatment for clinical depression depends on the severity of your symptoms but typically includes antidepressant medicine, counseling with a mental health professional, or a combination of both. Your caregiver will help to determine what treatment is best for you. Depression caused by physical illness usually goes away with appropriate medical treatment of the illness. If prescription medicine is causing depression, talk with your caregiver about stopping the medicine, decreasing  the dose, or substituting another medicine. Depression  caused by abuse of alcohol or illicit drugs abuse goes away with abstinence from these substances. Some adults need professional help in order to stop drinking or using drugs. SEEK IMMEDIATE CARE IF:  You have thoughts about hurting yourself or others.  You lose touch with reality (have psychotic symptoms).  You are taking medicine for depression and have a serious side effect. FOR MORE INFORMATION National [employer] on Mental Illness: www.nami.[employer]: http://www.maynard.net/ Document Released: 03/12/2000 Document Revised: 09/14/2011 Document Reviewed: 06/14/2011 [facility] Patient Information 2014 [place], [place].

## 2013-05-10 NOTE — ED Notes (Signed)
Pt a+ox4, presents with mom with c/o increasing depression over the last few weeks and "thoughts of death".  Pt denies specific suicidal plan.  Pt denies HI. Pt denies drugs/ETOH.  Pt calm/coop.  Good eye contact.  Speaking full/clear sentences, rr even/un-lab.  MAEI.  Skin PWD.  Pt reports seen by psychiatrist x2 days ago and started on zoloft, pt sts "i think i just need to give it more time to work".  NAD.

## 2013-05-10 NOTE — ED Notes (Signed)
Belongings taken home with mom..[name]

## 2013-07-14 IMAGING — CR DG HIP (WITH OR WITHOUT PELVIS) 2-3V*L*
3 series · 3 of 3 positions shown · non-contrast
Comparison: None.

CLINICAL DATA: Hip pain after fall

LEFT HIP - COMPLETE 2+ VIEW

[t pelvis a.p.]
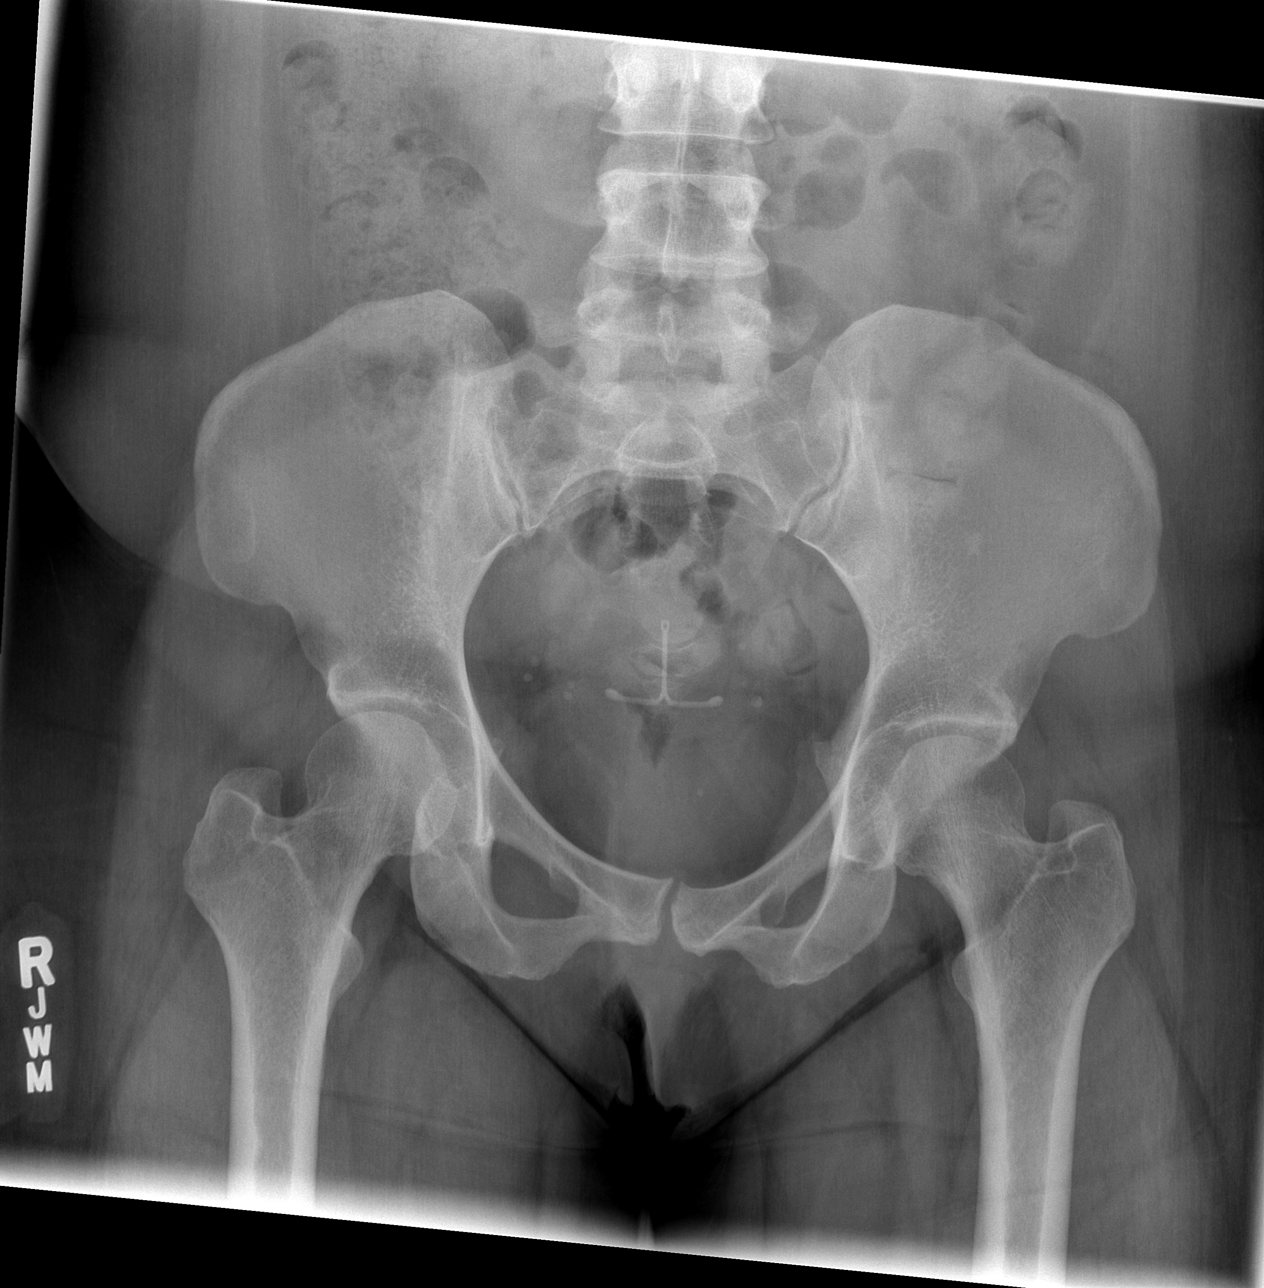

[t hip ap left]
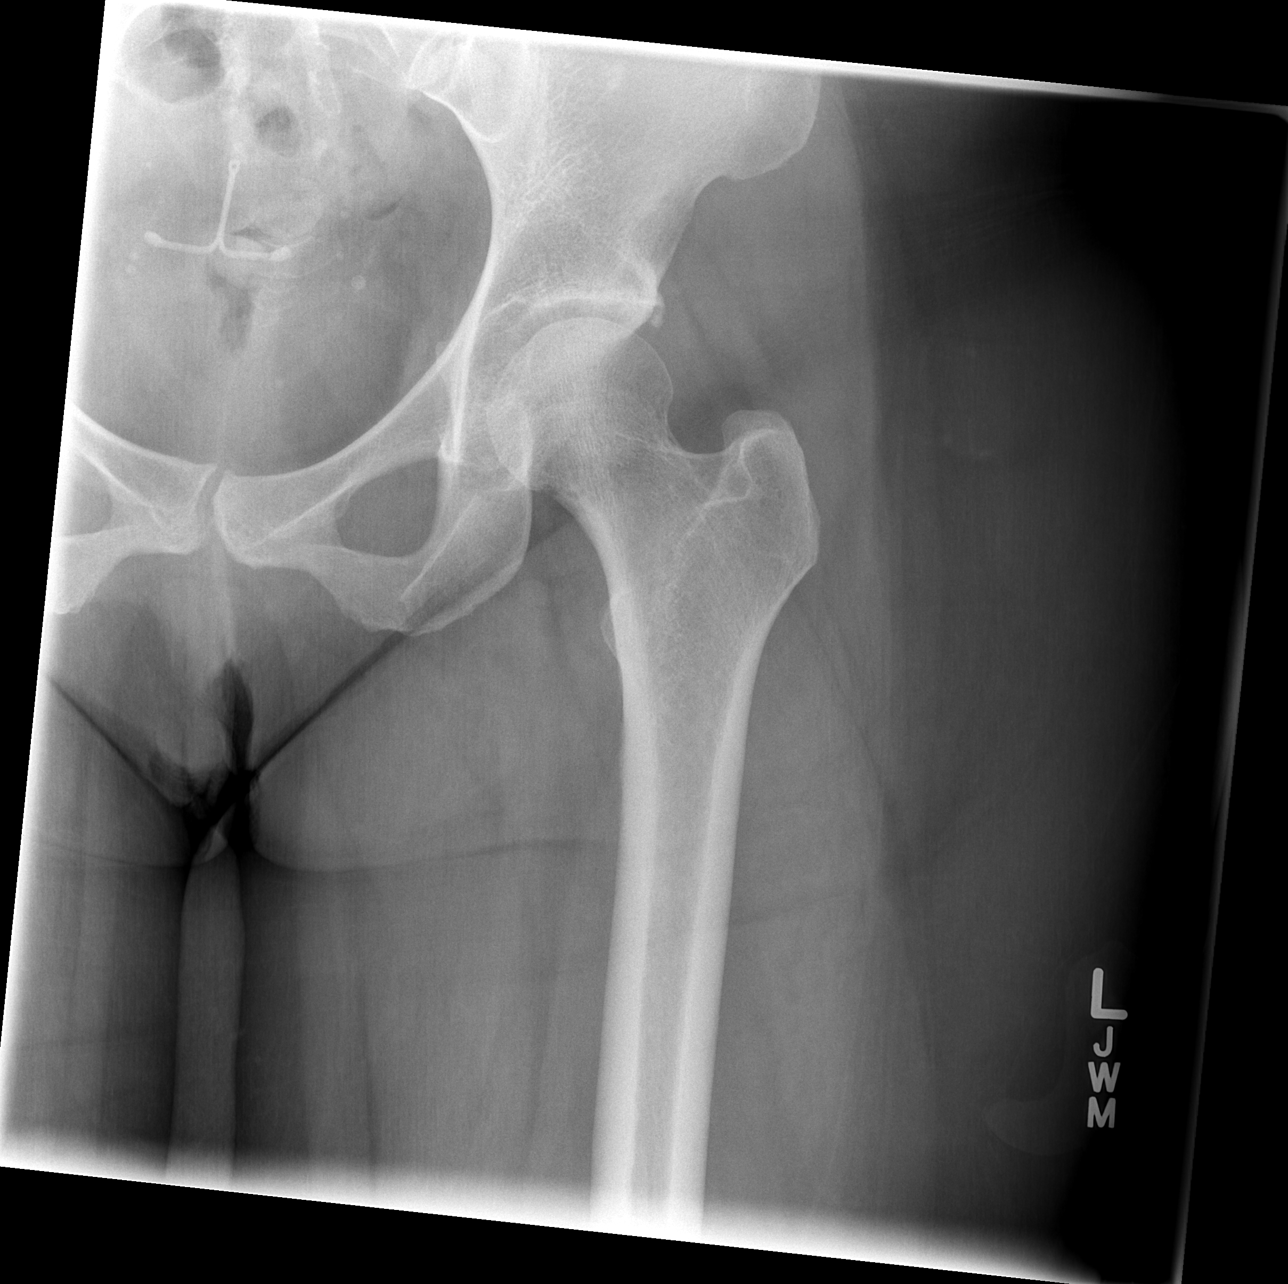

[t hip frog leg left]
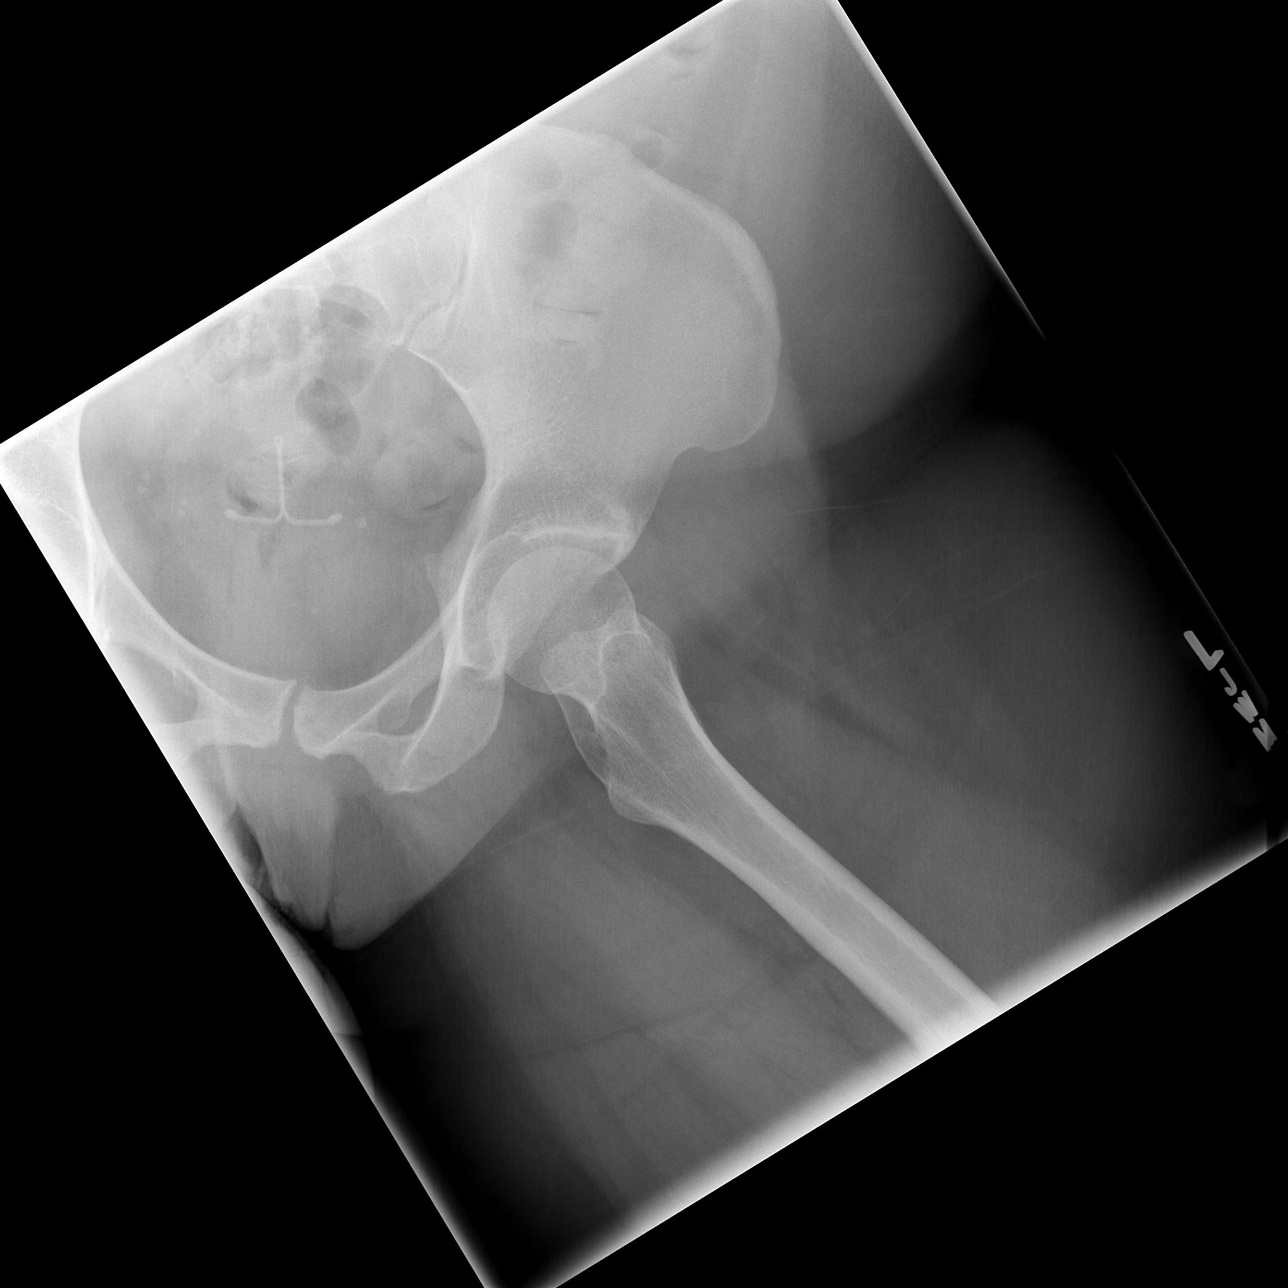

[3 of 3 positions shown; findings below may reference images not displayed]

FINDINGS: Frontal pelvis with AP and frog-leg lateral views of the
left hip show no fracture.  Joint space in the hips is well
preserved and symmetric.  The symphysis pubis and SI joints are
normal.  IUD projects over the central anatomic pelvis.
IMPRESSION: No acute bony findings.

## 2014-02-22 ENCOUNTER — Ambulatory Visit (INDEPENDENT_AMBULATORY_CARE_PROVIDER_SITE_OTHER): Payer: Medicare PPO | Admitting: Emergency Medicine

## 2014-02-22 VITALS — BP 102/64 | HR 136 | Temp 98.5°F | Resp 19 | Ht 64.0 in | Wt 200.6 lb

## 2014-02-22 DIAGNOSIS — J029 Acute pharyngitis, unspecified: Secondary | ICD-10-CM

## 2014-02-22 MED ORDER — AMOXICILLIN-POT CLAVULANATE 875-125 MG PO TABS: 1.0000 | ORAL_TABLET | Freq: Two times a day (BID) | ORAL | Status: DC

## 2014-02-22 MED ORDER — ACETAMINOPHEN-CODEINE #3 300-30 MG PO TABS: 1.0000 | ORAL_TABLET | ORAL | Status: DC | PRN

## 2014-02-22 MED ORDER — CLARITHROMYCIN 500 MG PO TABS: 500.0000 mg | ORAL_TABLET | Freq: Two times a day (BID) | ORAL | Status: DC

## 2014-02-22 NOTE — Patient Instructions (Signed)

## 2014-02-22 NOTE — Progress Notes (Signed)
[facility] [address], [place] [address] [phone]  Date:  02/22/2014   Name:  Stacey Shutteraima S Stoklosa   DOB:  06/23/1975   MRN:  [id number]  PCP:  No PCP Per Patient    Chief Complaint: Sore Throat; Ear Pain; and Fever   History of Present Illness:  Stacey Weaver is a 38 y.o. very pleasant female patient who presents with the following:  Ill for a week with sore throat.  Treated Wednesday with ZPAK and has not improved Fever.  Swelling in left side of throat and neck.  No cough or coryza No nausea or vomiting.  Pain with swallowing.   Post nasal drainage No improvement with over the counter medications or other home remedies. Denies other complaint or health concern today.   Patient Active Problem List   Diagnosis Date Noted  . Rhabdomyolysis 08/08/2011  . Antihistamines overdose 08/08/2011  . SSRI overdose 08/07/2011  . Depression 08/07/2011  . Anxiety disorder 08/07/2011  . Obesity 08/07/2011  . Hypothyroidism 08/07/2011    Past Medical History  Diagnosis Date  . Bipolar 1 disorder   . Asthma   . Migraine   . Anxiety     Past Surgical History  Procedure Laterality Date  . Knee surgery      History  Substance Use Topics  . Smoking status: Never Smoker   . Smokeless tobacco: Not on file  . Alcohol Use: No    Family History  Problem Relation Age of Onset  . Hyperlipidemia Mother   . Hypertension Mother   . Heart disease Father   . Hyperlipidemia Maternal Grandmother   . Cancer Maternal Grandmother   . Cancer Maternal Grandfather     Allergies  Allergen Reactions  . Almotriptan Malate Shortness Of Breath  . Imitrex [Sumatriptan] Shortness Of Breath and Other (See Comments)    Muscle spasms  . Zomig Shortness Of Breath  . Cymbalta [Duloxetine Hcl] Other (See Comments)    suicidal thoughts  . Geodon [Ziprasidone Hcl] Other (See Comments)    hallucanations  . Lactose Intolerance (Gi) Diarrhea and Other (See Comments)    Gas  and bloated     Medication list has been reviewed and updated.  Current Outpatient Prescriptions on File Prior to Visit  Medication Sig Dispense Refill  . albuterol (PROVENTIL HFA;VENTOLIN HFA) 108 (90 BASE) MCG/ACT inhaler Inhale 2 puffs into the lungs every 6 (six) hours as needed. Shortness of breath    . ARIPiprazole (ABILIFY) 10 MG tablet Take 10 mg by mouth at bedtime.    . clonazePAM (KLONOPIN) 0.5 MG tablet Take 0.5 mg by mouth daily as needed for anxiety.    . diphenhydrAMINE (BENADRYL) 25 mg capsule Take 50 mg by mouth daily as needed for allergies.    [name]. etonogestrel (NEXPLANON) 68 MG IMPL implant Inject 1 each into the skin once.    . lamoTRIgine (LAMICTAL) 25 MG tablet Take 25 mg by mouth at bedtime. Anti-seizure medication. Consult needed.    . sertraline (ZOLOFT) 25 MG tablet Take 25 mg by mouth daily after breakfast.    . traZODone (DESYREL) 100 MG tablet Take 100 mg by mouth at bedtime.      [name]. zolpidem (AMBIEN) 10 MG tablet Take 10 mg by mouth at bedtime as needed for sleep.     No current facility-administered medications on file prior to visit.    Review of Systems:  As per HPI, otherwise negative.    Physical  Examination: Filed Vitals:   02/22/14 1251  BP: 102/64  Pulse: 136  Temp: 98.5 F (36.9 C)  Resp: 19   Filed Vitals:   02/22/14 1251  Height: 5\' 4"  (1.626 m)  Weight: 200 lb 9.6 oz (90.992 kg)   Body mass index is 34.42 kg/(m^2). Ideal Body Weight: Weight in (lb) to have BMI = 25: 145.3  GEN: WDWN, NAD, Non-toxic, A & O x 3 HEENT: Atraumatic, Normocephalic. Neck supple. No masses, No LAD.  Large tonsils Ears and Nose: No external deformity. CV: RRR, No M/G/R. No JVD. No thrill. No extra heart sounds. PULM: CTA B, no wheezes, crackles, rhonchi. No retractions. No resp. distress. No accessory muscle use. ABD: S, NT, ND, +BS. No rebound. No HSM. EXTR: No c/c/e NEURO Normal gait.  PSYCH: Normally interactive. Conversant. Not depressed or anxious  appearing.  Calm demeanor.    Assessment and Plan: Pharyngitis augmentin tyl #3   Signed,  Phillips OdorJeffery Rolando Hessling, MD

## 2014-06-30 ENCOUNTER — Encounter (HOSPITAL_BASED_OUTPATIENT_CLINIC_OR_DEPARTMENT_OTHER): Payer: Self-pay | Admitting: *Deleted

## 2014-06-30 ENCOUNTER — Emergency Department (HOSPITAL_BASED_OUTPATIENT_CLINIC_OR_DEPARTMENT_OTHER)
Admission: EM | Admit: 2014-06-30 | Discharge: 2014-06-30 | Disposition: A | Discharge status: Home / Self Care | Payer: Medicare PPO | Attending: Emergency Medicine | Admitting: Emergency Medicine

## 2014-06-30 DIAGNOSIS — G43909 Migraine, unspecified, not intractable, without status migrainosus: Secondary | ICD-10-CM | POA: Diagnosis not present

## 2014-06-30 DIAGNOSIS — F319 Bipolar disorder, unspecified: Secondary | ICD-10-CM | POA: Insufficient documentation

## 2014-06-30 DIAGNOSIS — J029 Acute pharyngitis, unspecified: Secondary | ICD-10-CM

## 2014-06-30 DIAGNOSIS — Z79899 Other long term (current) drug therapy: Secondary | ICD-10-CM | POA: Diagnosis not present

## 2014-06-30 DIAGNOSIS — Z880 Allergy status to penicillin: Secondary | ICD-10-CM | POA: Insufficient documentation

## 2014-06-30 DIAGNOSIS — H9202 Otalgia, left ear: Secondary | ICD-10-CM | POA: Diagnosis not present

## 2014-06-30 DIAGNOSIS — F419 Anxiety disorder, unspecified: Secondary | ICD-10-CM | POA: Insufficient documentation

## 2014-06-30 DIAGNOSIS — Z792 Long term (current) use of antibiotics: Secondary | ICD-10-CM | POA: Insufficient documentation

## 2014-06-30 DIAGNOSIS — J45909 Unspecified asthma, uncomplicated: Secondary | ICD-10-CM | POA: Diagnosis not present

## 2014-06-30 DIAGNOSIS — M791 Myalgia: Secondary | ICD-10-CM | POA: Diagnosis not present

## 2014-06-30 LAB — RAPID STREP SCREEN (MED CTR MEBANE ONLY): Streptococcus, Group A Screen (Direct): NEGATIVE

## 2014-06-30 MED ORDER — FLUTICASONE PROPIONATE 50 MCG/ACT NA SUSP: 1.0000 | Freq: Every day | NASAL | Status: DC

## 2014-06-30 MED ORDER — HYDROCODONE-ACETAMINOPHEN 5-325 MG PO TABS
1.0000 | ORAL_TABLET | Freq: Once | ORAL | Status: AC
  Administered 2014-06-30: 1 via ORAL
  Filled 2014-06-30: qty 1

## 2014-06-30 MED ORDER — HYDROCODONE-ACETAMINOPHEN 5-325 MG PO TABS: 1.0000 | ORAL_TABLET | Freq: Once | ORAL | Status: DC

## 2014-06-30 MED ORDER — KETOROLAC TROMETHAMINE 60 MG/2ML IM SOLN
60.0000 mg | Freq: Once | INTRAMUSCULAR | Status: AC
  Administered 2014-06-30: 60 mg via INTRAMUSCULAR
  Filled 2014-06-30: qty 2

## 2014-06-30 MED ORDER — SALINE SPRAY 0.65 % NA SOLN: 1.0000 | NASAL | Status: DC | PRN

## 2014-06-30 NOTE — Discharge Instructions (Signed)
Pharyngitis Pharyngitis is redness, pain, and swelling (inflammation) of your pharynx.  CAUSES  Pharyngitis is usually caused by infection. Most of the time, these infections are from viruses (viral) and are part of a cold. However, sometimes pharyngitis is caused by bacteria (bacterial). Pharyngitis can also be caused by allergies. Viral pharyngitis may be spread from person to person by coughing, sneezing, and personal items or utensils (cups, forks, spoons, toothbrushes). Bacterial pharyngitis may be spread from person to person by more intimate contact, such as kissing.  SIGNS AND SYMPTOMS  Symptoms of pharyngitis include:   Sore throat.   Tiredness (fatigue).   Low-grade fever.   Headache.  Joint pain and muscle aches.  Skin rashes.  Swollen lymph nodes.  Plaque-like film on throat or tonsils (often seen with bacterial pharyngitis). DIAGNOSIS  Your health care provider will ask you questions about your illness and your symptoms. Your medical history, along with a physical exam, is often all that is needed to diagnose pharyngitis. Sometimes, a rapid strep test is done. Other lab tests may also be done, depending on the suspected cause.  TREATMENT  Viral pharyngitis will usually get better in 3-4 days without the use of medicine. Bacterial pharyngitis is treated with medicines that kill germs (antibiotics).  HOME CARE INSTRUCTIONS   Drink enough water and fluids to keep your urine clear or pale yellow.   Only take over-the-counter or prescription medicines as directed by your health care provider:   If you are prescribed antibiotics, make sure you finish them even if you start to feel better.   Do not take aspirin.   Get lots of rest.   Gargle with 8 oz of salt water ( tsp of salt per 1 qt of water) as often as every 1-2 hours to soothe your throat.   Throat lozenges (if you are not at risk for choking) or sprays may be used to soothe your throat. SEEK MEDICAL  CARE IF:   You have large, tender lumps in your neck.  You have a rash.  You cough up green, yellow-brown, or bloody spit. SEEK IMMEDIATE MEDICAL CARE IF:   Your neck becomes stiff.  You drool or are unable to swallow liquids.  You vomit or are unable to keep medicines or liquids down.  You have severe pain that does not go away with the use of recommended medicines.  You have trouble breathing (not caused by a stuffy nose). MAKE SURE YOU:   Understand these instructions.  Will watch your condition.  Will get help right away if you are not doing well or get worse. Document Released: 03/15/2005 Document Revised: 01/03/2013 Document Reviewed: 11/20/2012 ExitCare Patient Information 2015 [employer]. This information is not intended to replace advice given to you by your health care provider. Make sure you discuss any questions you have with your health care provider. Otalgia The most common reason for this in children is an infection of the middle ear. Pain from the middle ear is usually caused by a build-up of fluid and pressure behind the eardrum. Pain from an earache can be sharp, dull, or burning. The pain may be temporary or constant. The middle ear is connected to the nasal passages by a short narrow tube called the Eustachian tube. The Eustachian tube allows fluid to drain out of the middle ear, and helps keep the pressure in your ear equalized. CAUSES  A cold or allergy can block the Eustachian tube with inflammation and the build-up of secretions. This is   especially likely in small children, because their Eustachian tube is shorter and more horizontal. When the Eustachian tube closes, the normal flow of fluid from the middle ear is stopped. Fluid can accumulate and cause stuffiness, pain, hearing loss, and an ear infection if germs start growing in this area. SYMPTOMS  The symptoms of an ear infection may include fever, ear pain, fussiness, increased crying, and  irritability. Many children will have temporary and minor hearing loss during and right after an ear infection. Permanent hearing loss is rare, but the risk increases the more infections a child has. Other causes of ear pain include retained water in the outer ear canal from swimming and bathing. Ear pain in adults is less likely to be from an ear infection. Ear pain may be referred from other locations. Referred pain may be from the joint between your jaw and the skull. It may also come from a tooth problem or problems in the neck. Other causes of ear pain include:  A foreign body in the ear.  Outer ear infection.  Sinus infections.  Impacted ear wax.  Ear injury.  Arthritis of the jaw or TMJ problems.  Middle ear infection.  Tooth infections.  Sore throat with pain to the ears. DIAGNOSIS  Your caregiver can usually make the diagnosis by examining you. Sometimes other special studies, including x-rays and lab work may be necessary. TREATMENT   If antibiotics were prescribed, use them as directed and finish them even if you or your child's symptoms seem to be improved.  Sometimes PE tubes are needed in children. These are little plastic tubes which are put into the eardrum during a simple surgical procedure. They allow fluid to drain easier and allow the pressure in the middle ear to equalize. This helps relieve the ear pain caused by pressure changes. HOME CARE INSTRUCTIONS   Only take over-the-counter or prescription medicines for pain, discomfort, or fever as directed by your caregiver. DO NOT GIVE CHILDREN ASPIRIN because of the association of Reye's Syndrome in children taking aspirin.  Use a cold pack applied to the outer ear for 15-20 minutes, 03-04 times per day or as needed may reduce pain. Do not apply ice directly to the skin. You may cause frost bite.  Over-the-counter ear drops used as directed may be effective. Your caregiver may sometimes prescribe ear  drops.  Resting in an upright position may help reduce pressure in the middle ear and relieve pain.  Ear pain caused by rapidly descending from high altitudes can be relieved by swallowing or chewing gum. Allowing infants to suck on a bottle during airplane travel can help.  Do not smoke in the house or near children. If you are unable to quit smoking, smoke outside.  Control allergies. SEEK IMMEDIATE MEDICAL CARE IF:   You or your child are becoming sicker.  Pain or fever relief is not obtained with medicine.  You or your child's symptoms (pain, fever, or irritability) do not improve within 24 to 48 hours or as instructed.  Severe pain suddenly stops hurting. This may indicate a ruptured eardrum.  You or your children develop new problems such as severe headaches, stiff neck, difficulty swallowing, or swelling of the face or around the ear. Document Released: 10/31/2003 Document Revised: 06/07/2011 Document Reviewed: 03/06/2008 ExitCare Patient Information 2015 [employer]. This information is not intended to replace advice given to you by your health care provider. Make sure you discuss any questions you have with your health care provider.  

## 2014-06-30 NOTE — ED Provider Notes (Signed)
CSN: [id number]     Arrival date & time 06/30/14  0349 History   First MD Initiated Contact with Patient 06/30/14 0403     Chief Complaint  Patient presents with  . Sore Throat     (Consider location/radiation/quality/duration/timing/severity/associated sxs/prior Treatment) HPI  This is a 39 year old female who presents with otalgia and sore throat. Patient reports that she woke up tonight with "worst ear pain of my life." Patient reports recent "flulike" symptoms. Onset of symptoms Wednesday. Patient reports fevers to 100.2 on Wednesday. She was sent home from work on Thursday and given Tamiflu for possible influenza. Patient reports that she has had some minor ear pain and sore throat since that time; however, that acutely worsened tonight. Rates pain at 9 out of 10. Has been taking Motrin and Tylenol at home. Reports occasional cough. Also reports body aches. Denies any nausea, vomiting, or diarrhea. Denies any difficulty swallowing. Does state it is painful to swallow.  Past Medical History  Diagnosis Date  . Bipolar 1 disorder   . Asthma   . Migraine   . Anxiety    Past Surgical History  Procedure Laterality Date  . Knee surgery     Family History  Problem Relation Age of Onset  . Hyperlipidemia Mother   . Hypertension Mother   . Heart disease Father   . Hyperlipidemia Maternal Grandmother   . Cancer Maternal Grandmother   . Cancer Maternal Grandfather    History  Substance Use Topics  . Smoking status: Never Smoker   . Smokeless tobacco: Not on file  . Alcohol Use: No   OB History    No data available     Review of Systems  Constitutional: Positive for fever.  HENT: Positive for congestion, ear pain and sore throat.   Respiratory: Positive for cough. Negative for chest tightness and shortness of breath.   Cardiovascular: Negative for chest pain.  Gastrointestinal: Negative for nausea, vomiting and abdominal pain.  Genitourinary: Negative for dysuria.   Musculoskeletal: Positive for myalgias. Negative for back pain.  Skin: Negative for wound.  Neurological: Positive for headaches.  Psychiatric/Behavioral: Negative for confusion.  All other systems reviewed and are negative.     Allergies  Almotriptan malate; Imitrex; Zomig; Cymbalta; Geodon; Lactose intolerance (gi); and Penicillins  Home Medications   Prior to Admission medications   Medication Sig Start Date End Date Taking? Authorizing Provider  acetaminophen-codeine (TYLENOL #3) 300-30 MG per tablet Take 1-2 tablets by mouth every 4 (four) hours as needed. 02/22/14   Carmelina DaneJeffery S Anderson, MD  albuterol (PROVENTIL HFA;VENTOLIN HFA) 108 (90 BASE) MCG/ACT inhaler Inhale 2 puffs into the lungs every 6 (six) hours as needed. Shortness of breath    Historical Provider, MD  ARIPiprazole (ABILIFY) 10 MG tablet Take 10 mg by mouth at bedtime.    Historical Provider, MD  clarithromycin (BIAXIN) 500 MG tablet Take 1 tablet (500 mg total) by mouth 2 (two) times daily. 02/22/14   Carmelina DaneJeffery S Anderson, MD  clonazePAM (KLONOPIN) 0.5 MG tablet Take 0.5 mg by mouth daily as needed for anxiety.    Historical Provider, MD  diphenhydrAMINE (BENADRYL) 25 mg capsule Take 50 mg by mouth daily as needed for allergies.    Historical Provider, MD  etonogestrel (NEXPLANON) 68 MG IMPL implant Inject 1 each into the skin once. 01/07/13   Historical Provider, MD  fluticasone (FLONASE) 50 MCG/ACT nasal spray Place 1 spray into both nostrils daily. 06/30/14   Shon Batonourtney F Horton, MD  HYDROcodone-acetaminophen (NORCO/VICODIN)  5-325 MG per tablet Take 1 tablet by mouth once. 06/30/14   Shon Baton, MD  lamoTRIgine (LAMICTAL) 25 MG tablet Take 25 mg by mouth at bedtime. Anti-seizure medication. Consult needed.    Historical Provider, MD  sertraline (ZOLOFT) 25 MG tablet Take 25 mg by mouth daily after breakfast.    Historical Provider, MD  sodium chloride (OCEAN) 0.65 % SOLN nasal spray Place 1 spray into both nostrils  as needed for congestion. 06/30/14   Shon Baton, MD  traZODone (DESYREL) 100 MG tablet Take 100 mg by mouth at bedtime.      Historical Provider, MD  zolpidem (AMBIEN) 10 MG tablet Take 10 mg by mouth at bedtime as needed for sleep.    Historical Provider, MD   BP 111/61 mmHg  Pulse 79  Temp(Src) 98 F (36.7 C) (Oral)  Resp 18  Ht  (1.626 m)  Wt 190 lb (86.183 kg)  BMI 32.60 kg/m2  SpO2 100%  LMP 06/29/2014 (Exact Date) Physical Exam  Constitutional: She is oriented to person, place, and time. She appears well-developed and well-nourished.  HENT:  Head: Normocephalic and atraumatic.  Right Ear: External ear normal.  Mouth/Throat: Oropharynx is clear and moist.  Effusion noted behind left ear, distorted light reflex, bulging, no significant erythema Posterior oropharynx clear, mild swelling of the bilateral tonsils, symmetric, no tonsillar exudate noted, no trismus  Eyes: Pupils are equal, round, and reactive to light.  Neck: Normal range of motion. Neck supple.  Cardiovascular: Normal rate, regular rhythm and normal heart sounds.   No murmur heard. Pulmonary/Chest: Effort normal and breath sounds normal. No respiratory distress. She has no wheezes.  Abdominal: Soft. There is no tenderness.  Lymphadenopathy:    She has cervical adenopathy.  Neurological: She is alert and oriented to person, place, and time.  Skin: Skin is warm and dry.  Psychiatric: She has a normal mood and affect.  Nursing note and vitals reviewed.   ED Course  Procedures (including critical care time) Labs Review Labs Reviewed  RAPID STREP SCREEN  CULTURE, GROUP A STREP    Imaging Review No results found.   EKG Interpretation None      MDM   Final diagnoses:  Otalgia, left  Sore throat   Patient presents with otalgia and sore throat. Nontoxic on exam. Afebrile. Reports recent fevers but has been afebrile for the last couple days. Evidence of effusion behind the right ear without  overt otitis media. No significant pharyngeal findings. Strep screen negative. Patient is 2/4 Centor criteria +.  We'll send for culture. Discussed with patient supportive care including pain medication, Flonase, and nasal saline to help drain her in her ear. At this time I do not feel antibiotics are indicated. Discussed with patient management. Patient voiced understanding   After history, exam, and medical workup I feel the patient has been appropriately medically screened and is safe for discharge home. Pertinent diagnoses were discussed with the patient. Patient was given return precautions.      Shon Baton, MD 06/30/14 [phone]

## 2014-06-30 NOTE — ED Notes (Signed)
C/o L earache, sore throat, HA and dizziness. C/c is L throat and L ear pain. Swollen tonsils noted, L>R. Rates 9/10. Describes as sharp. Have been alternating tylenol and motrin. Last tylenol 2300, last motrin ~ 0330. Pain woke pt up.

## 2014-06-30 NOTE — ED Notes (Signed)
Dr. Horton at [facility].  

## 2014-07-02 LAB — CULTURE, GROUP A STREP: Strep A Culture: NEGATIVE

## 2014-09-18 ENCOUNTER — Ambulatory Visit (INDEPENDENT_AMBULATORY_CARE_PROVIDER_SITE_OTHER): Payer: Medicare PPO | Admitting: Emergency Medicine

## 2014-09-18 DIAGNOSIS — M545 Low back pain, unspecified: Secondary | ICD-10-CM

## 2014-09-18 DIAGNOSIS — K029 Dental caries, unspecified: Secondary | ICD-10-CM | POA: Diagnosis not present

## 2014-09-18 DIAGNOSIS — N3000 Acute cystitis without hematuria: Secondary | ICD-10-CM

## 2014-09-18 LAB — POCT UA - MICROSCOPIC ONLY
Casts, Ur, LPF, POC: NEGATIVE
Crystals, Ur, HPF, POC: NEGATIVE
Mucus, UA: NEGATIVE
Yeast, UA: NEGATIVE

## 2014-09-18 LAB — POCT URINALYSIS DIPSTICK
BILIRUBIN UA: NEGATIVE
GLUCOSE UA: NEGATIVE
KETONES UA: NEGATIVE
Nitrite, UA: NEGATIVE
Protein, UA: NEGATIVE
Spec Grav, UA: 1.02
Urobilinogen, UA: 0.2
pH, UA: 5.5

## 2014-09-18 MED ORDER — SULFAMETHOXAZOLE-TRIMETHOPRIM 800-160 MG PO TABS: 1.0000 | ORAL_TABLET | Freq: Two times a day (BID) | ORAL | Status: DC

## 2014-09-18 MED ORDER — CLINDAMYCIN HCL 150 MG PO CAPS: 150.0000 mg | ORAL_CAPSULE | Freq: Three times a day (TID) | ORAL | Status: DC

## 2014-09-18 MED ORDER — NAPROXEN SODIUM 550 MG PO TABS: 550.0000 mg | ORAL_TABLET | Freq: Two times a day (BID) | ORAL | Status: DC

## 2014-09-18 MED ORDER — PHENAZOPYRIDINE HCL 200 MG PO TABS: 200.0000 mg | ORAL_TABLET | Freq: Three times a day (TID) | ORAL | Status: DC | PRN

## 2014-09-18 NOTE — Patient Instructions (Signed)

## 2014-09-18 NOTE — Progress Notes (Signed)
Subjective:  Patient ID: Stacey Weaver, female    DOB: May 15, 1975  Age: 39 y.o. MRN: [id number]  CC: Dental Pain; Back Pain; and Urinary Tract Infection   HPI [name] presents  with complaints. She said that she has since the weekend and a right mandibular molar. She is consulted her dentist and has no appointment with him until June 28. She has dysuria urgency and no frequency. Has no fever or chills. No vaginal discharge or bleeding. Low back pain. Said the pain passes down across her buttocks to her thighs. Pain is worse with bending and lifting. She denies any injury or overuse. She denies any radiation or neurologic symptoms in her legs. Has no history of prior back injury.  History [name] has a past medical history of Bipolar 1 disorder; Asthma; Migraine; and Anxiety.   She has past surgical history that includes Knee surgery.   Her  family history includes Cancer in her maternal grandfather and maternal grandmother; Heart disease in her father; Hyperlipidemia in her maternal grandmother and mother; Hypertension in her mother.  She   reports that she has never smoked. She does not have any smokeless tobacco history on file. She reports that she does not drink alcohol or use illicit drugs.  Outpatient Prescriptions Prior to Visit  Medication Sig Dispense Refill  . albuterol (PROVENTIL HFA;VENTOLIN HFA) 108 (90 BASE) MCG/ACT inhaler Inhale 2 puffs into the lungs every 6 (six) hours as needed. Shortness of breath    . clonazePAM (KLONOPIN) 0.5 MG tablet Take 0.5 mg by mouth daily as needed for anxiety.    [name] etonogestrel (NEXPLANON) 68 MG IMPL implant Inject 1 each into the skin once.    . lamoTRIgine (LAMICTAL) 25 MG tablet Take 25 mg by mouth at bedtime. Anti-seizure medication. Consult needed.    . traZODone (DESYREL) 100 MG tablet Take 100 mg by mouth at bedtime.      [name] zolpidem (AMBIEN) 10 MG tablet Take 10 mg by mouth at bedtime as needed for sleep.    [name]  acetaminophen-codeine (TYLENOL #3) 300-30 MG per tablet Take 1-2 tablets by mouth every 4 (four) hours as needed. (Patient not taking: Reported on 09/18/2014) 30 tablet 0  . ARIPiprazole (ABILIFY) 10 MG tablet Take 10 mg by mouth at bedtime.    . clarithromycin (BIAXIN) 500 MG tablet Take 1 tablet (500 mg total) by mouth 2 (two) times daily. (Patient not taking: Reported on 09/18/2014) 20 tablet 0  . diphenhydrAMINE (BENADRYL) 25 mg capsule Take 50 mg by mouth daily as needed for allergies.    . fluticasone (FLONASE) 50 MCG/ACT nasal spray Place 1 spray into both nostrils daily. (Patient not taking: Reported on 09/18/2014) 16 g 0  . HYDROcodone-acetaminophen (NORCO/VICODIN) 5-325 MG per tablet Take 1 tablet by mouth once. (Patient not taking: Reported on 09/18/2014) 10 tablet 0  . sodium chloride (OCEAN) 0.65 % SOLN nasal spray Place 1 spray into both nostrils as needed for congestion. (Patient not taking: Reported on 09/18/2014) 15 mL 0  . sertraline (ZOLOFT) 25 MG tablet Take 25 mg by mouth daily after breakfast.     No facility-administered medications prior to visit.    History   Social History  . Marital Status: Single    Spouse Name: N/A  . Number of Children: N/A  . Years of Education: N/A   Social History Main Topics  . Smoking status: Never Smoker   . Smokeless tobacco: Not on file  . Alcohol Use:  No  . Drug Use: No  . Sexual Activity: Yes    Birth Control/ Protection: Implant   Other Topics Concern  . None   Social History Narrative     Review of Systems  Constitutional: Negative for fever, chills and appetite change.  HENT: Positive for dental problem. Negative for congestion, ear pain, postnasal drip, sinus pressure and sore throat.   Eyes: Negative for pain and redness.  Respiratory: Negative for cough, shortness of breath and wheezing.   Cardiovascular: Negative for leg swelling.  Gastrointestinal: Negative for nausea, vomiting, abdominal pain, diarrhea, constipation  and blood in stool.  Endocrine: Negative for polyuria.  Genitourinary: Positive for dysuria and urgency. Negative for frequency and flank pain.  Musculoskeletal: Positive for back pain. Negative for gait problem.  Skin: Negative for rash.  Neurological: Negative for weakness and headaches.  Psychiatric/Behavioral: Negative for confusion and decreased concentration. The patient is not nervous/anxious.     Objective:  There were no vitals taken for this visit.  Physical Exam  Constitutional: She is oriented to person, place, and time. She appears well-developed and well-nourished. No distress.  HENT:  Head: Normocephalic and atraumatic.  Right Ear: External ear normal.  Left Ear: External ear normal.  Nose: Nose normal.  Mouth/Throat: Dental caries present.    Eyes: Conjunctivae and EOM are normal. Pupils are equal, round, and reactive to light. No scleral icterus.  Neck: Normal range of motion. Neck supple. No tracheal deviation present.  Cardiovascular: Normal rate, regular rhythm and normal heart sounds.   Pulmonary/Chest: Effort normal. No respiratory distress. She has no wheezes. She has no rales.  Abdominal: She exhibits no mass. There is no tenderness. There is no rebound and no guarding.  Musculoskeletal: She exhibits no edema.       Lumbar back: She exhibits tenderness and spasm.  Lymphadenopathy:    She has no cervical adenopathy.  Neurological: She is alert and oriented to person, place, and time. Coordination normal.  Skin: Skin is warm and dry. No rash noted.  Psychiatric: She has a normal mood and affect. Her behavior is normal.      Assessment & Plan:   Leita was seen today for dental pain, back pain and urinary tract infection.  Diagnoses and all orders for this visit:  Bilateral low back pain without sciatica Orders: -     POCT urinalysis dipstick -     POCT UA - Microscopic Only  Dental caries  Acute cystitis without hematuria  Other orders -      clindamycin (CLEOCIN) 150 MG capsule; Take 1 capsule (150 mg total) by mouth 3 (three) times daily. -     naproxen sodium (ANAPROX DS) 550 MG tablet; Take 1 tablet (550 mg total) by mouth 2 (two) times daily with a meal. -     sulfamethoxazole-trimethoprim (BACTRIM DS,SEPTRA DS) 800-160 MG per tablet; Take 1 tablet by mouth 2 (two) times daily. -     phenazopyridine (PYRIDIUM) 200 MG tablet; Take 1 tablet (200 mg total) by mouth 3 (three) times daily as needed.   I am having Ms. Lape start on clindamycin, naproxen sodium, sulfamethoxazole-trimethoprim, and phenazopyridine. I am also having her maintain her traZODone, lamoTRIgine, albuterol, ARIPiprazole, clonazePAM, zolpidem, etonogestrel, diphenhydrAMINE, acetaminophen-codeine, clarithromycin, HYDROcodone-acetaminophen, fluticasone, sodium chloride, and sertraline.  Meds ordered this encounter  Medications  . sertraline (ZOLOFT) 50 MG tablet    Sig: Take 50 mg by mouth daily.  . clindamycin (CLEOCIN) 150 MG capsule    Sig: Take 1  capsule (150 mg total) by mouth 3 (three) times daily.    Dispense:  30 capsule    Refill:  0  . naproxen sodium (ANAPROX DS) 550 MG tablet    Sig: Take 1 tablet (550 mg total) by mouth 2 (two) times daily with a meal.    Dispense:  40 tablet    Refill:  0  . sulfamethoxazole-trimethoprim (BACTRIM DS,SEPTRA DS) 800-160 MG per tablet    Sig: Take 1 tablet by mouth 2 (two) times daily.    Dispense:  20 tablet    Refill:  0  . phenazopyridine (PYRIDIUM) 200 MG tablet    Sig: Take 1 tablet (200 mg total) by mouth 3 (three) times daily as needed.    Dispense:  6 tablet    Refill:  0   She was instructed to follow-up with her dentist at her regular appointment on the 28th. There is no evidence of any abscess formation.   Appropriate red flag conditions were discussed with the patient as well as actions that should be taken.  Patient expressed his understanding.  Follow-up: Return if symptoms worsen or fail to  improve.  Carmelina Dane, MD   Results for orders placed or performed in visit on 09/18/14  POCT urinalysis dipstick  Result Value Ref Range   Color, UA yellow    Clarity, UA cloudy    Glucose, UA neg    Bilirubin, UA neg    Ketones, UA neg    Spec Grav, UA 1.020    Blood, UA large    pH, UA 5.5    Protein, UA neg    Urobilinogen, UA 0.2    Nitrite, UA neg    Leukocytes, UA small (1+) (A) Negative  POCT UA - Microscopic Only  Result Value Ref Range   WBC, Ur, HPF, POC 3-6    RBC, urine, microscopic 2-4    Bacteria, U Microscopic large    Mucus, UA neg    Epithelial cells, urine per micros 2-3    Crystals, Ur, HPF, POC neg    Casts, Ur, LPF, POC neg    Yeast, UA neg

## 2014-10-16 ENCOUNTER — Encounter (HOSPITAL_BASED_OUTPATIENT_CLINIC_OR_DEPARTMENT_OTHER): Payer: Self-pay

## 2014-10-16 ENCOUNTER — Emergency Department (HOSPITAL_BASED_OUTPATIENT_CLINIC_OR_DEPARTMENT_OTHER): Payer: Medicare PPO

## 2014-10-16 ENCOUNTER — Emergency Department (HOSPITAL_BASED_OUTPATIENT_CLINIC_OR_DEPARTMENT_OTHER)
Admission: EM | Admit: 2014-10-16 | Discharge: 2014-10-17 | Disposition: A | Discharge status: Home / Self Care | Payer: Medicare PPO | Attending: Emergency Medicine | Admitting: Emergency Medicine

## 2014-10-16 DIAGNOSIS — Y998 Other external cause status: Secondary | ICD-10-CM | POA: Insufficient documentation

## 2014-10-16 DIAGNOSIS — S29092A Other injury of muscle and tendon of back wall of thorax, initial encounter: Secondary | ICD-10-CM | POA: Diagnosis not present

## 2014-10-16 DIAGNOSIS — Z792 Long term (current) use of antibiotics: Secondary | ICD-10-CM | POA: Insufficient documentation

## 2014-10-16 DIAGNOSIS — J45909 Unspecified asthma, uncomplicated: Secondary | ICD-10-CM | POA: Insufficient documentation

## 2014-10-16 DIAGNOSIS — W01198A Fall on same level from slipping, tripping and stumbling with subsequent striking against other object, initial encounter: Secondary | ICD-10-CM | POA: Diagnosis not present

## 2014-10-16 DIAGNOSIS — Y9289 Other specified places as the place of occurrence of the external cause: Secondary | ICD-10-CM | POA: Insufficient documentation

## 2014-10-16 DIAGNOSIS — R52 Pain, unspecified: Secondary | ICD-10-CM

## 2014-10-16 DIAGNOSIS — Y9389 Activity, other specified: Secondary | ICD-10-CM | POA: Diagnosis not present

## 2014-10-16 DIAGNOSIS — F419 Anxiety disorder, unspecified: Secondary | ICD-10-CM | POA: Diagnosis not present

## 2014-10-16 DIAGNOSIS — Z79899 Other long term (current) drug therapy: Secondary | ICD-10-CM | POA: Diagnosis not present

## 2014-10-16 DIAGNOSIS — S0990XA Unspecified injury of head, initial encounter: Secondary | ICD-10-CM | POA: Diagnosis not present

## 2014-10-16 DIAGNOSIS — G43909 Migraine, unspecified, not intractable, without status migrainosus: Secondary | ICD-10-CM | POA: Diagnosis not present

## 2014-10-16 DIAGNOSIS — W19XXXA Unspecified fall, initial encounter: Secondary | ICD-10-CM

## 2014-10-16 DIAGNOSIS — F319 Bipolar disorder, unspecified: Secondary | ICD-10-CM | POA: Insufficient documentation

## 2014-10-16 MED ORDER — NAPROXEN 500 MG PO TABS: 500.0000 mg | ORAL_TABLET | Freq: Two times a day (BID) | ORAL | Status: DC

## 2014-10-16 MED ORDER — HYDROCODONE-ACETAMINOPHEN 5-325 MG PO TABS
2.0000 | ORAL_TABLET | Freq: Once | ORAL | Status: AC
  Administered 2014-10-16: 2 via ORAL
  Filled 2014-10-16: qty 2

## 2014-10-16 NOTE — ED Notes (Signed)
Pt refused ibuprofen/tylenol offered for pain,

## 2014-10-16 NOTE — ED Notes (Signed)
MD at bedside. 

## 2014-10-16 NOTE — ED Provider Notes (Addendum)
CSN: [id number]     Arrival date & time 10/16/14  2028 History  This chart was scribed for [name], MD by Lyndel Safe, ED Scribe. This patient was seen in room MH05/MH05 and the patient's care was started 10:32 PM.    Chief Complaint  Patient presents with  . Fall   The history is provided by the patient. No language interpreter was used.    HPI Comments: Stacey Weaver is a 39 y.o. female, with a PMhx of bipolar disorder, asthma, migraine, and anxiety, who presents to the Emergency Department complaining of sudden onset, constant, moderate pain to the back of her head and upper back that radiates mildly to her mid back s/p fall. Pt also notes associated neck stiffness. Pt reports she was standing on a folding chair when she fell landing on flat on her back and hitting the back of her head approximately 2.5 hours ago. The pt states she felt 'good' and not ill before the fall. The pt has a PShx of knee surgery. She denies LOC, belief of fractures associated with fall, abdominal pain, or neck pain. She is allergic to triptans. Pain worse with movement improved with remaining still. No neck pain. No loss of consciousness no nausea or vomiting No other associated symptoms. No treatment prior to coming here.   Past Medical History  Diagnosis Date  . Bipolar 1 disorder   . Asthma   . Migraine   . Anxiety    Past Surgical History  Procedure Laterality Date  . Knee surgery     Family History  Problem Relation Age of Onset  . Hyperlipidemia Mother   . Hypertension Mother   . Heart disease Father   . Hyperlipidemia Maternal Grandmother   . Cancer Maternal Grandmother   . Cancer Maternal Grandfather    History  Substance Use Topics  . Smoking status: Never Smoker   . Smokeless tobacco: Not on file  . Alcohol Use: No   OB History    No data available     Review of Systems  Constitutional: Negative.   Respiratory: Negative.   Cardiovascular: Negative.   Gastrointestinal:  Negative.  Negative for abdominal pain.  Musculoskeletal: Positive for back pain. Negative for neck pain.  Skin: Negative.   Neurological: Positive for headaches. Negative for syncope.  Psychiatric/Behavioral: Negative.   All other systems reviewed and are negative.   Allergies  Almotriptan malate; Imitrex; Zomig; Cymbalta; Geodon; Lactose intolerance (gi); and Penicillins  Home Medications   Prior to Admission medications   Medication Sig Start Date End Date Taking? Authorizing Provider  thiothixene (NAVANE) 1 MG capsule Take 1 mg by mouth 2 (two) times daily.   Yes Historical Provider, MD  acetaminophen-codeine (TYLENOL #3) 300-30 MG per tablet Take 1-2 tablets by mouth every 4 (four) hours as needed. Patient not taking: Reported on 09/18/2014 02/22/14   Carmelina Dane, MD  albuterol (PROVENTIL HFA;VENTOLIN HFA) 108 (90 BASE) MCG/ACT inhaler Inhale 2 puffs into the lungs every 6 (six) hours as needed. Shortness of breath    Historical Provider, MD  ARIPiprazole (ABILIFY) 10 MG tablet Take 10 mg by mouth at bedtime.    Historical Provider, MD  clarithromycin (BIAXIN) 500 MG tablet Take 1 tablet (500 mg total) by mouth 2 (two) times daily. Patient not taking: Reported on 09/18/2014 02/22/14   Carmelina Dane, MD  clindamycin (CLEOCIN) 150 MG capsule Take 1 capsule (150 mg total) by mouth 3 (three) times daily. 09/18/14   Leotis Shames  Ewell Poe, MD  clonazePAM (KLONOPIN) 0.5 MG tablet Take 0.5 mg by mouth daily as needed for anxiety.    Historical Provider, MD  diphenhydrAMINE (BENADRYL) 25 mg capsule Take 50 mg by mouth daily as needed for allergies.    Historical Provider, MD  etonogestrel (NEXPLANON) 68 MG IMPL implant Inject 1 each into the skin once. 01/07/13   Historical Provider, MD  fluticasone (FLONASE) 50 MCG/ACT nasal spray Place 1 spray into both nostrils daily. Patient not taking: Reported on 09/18/2014 06/30/14   Shon Baton, MD  HYDROcodone-acetaminophen (NORCO/VICODIN)  5-325 MG per tablet Take 1 tablet by mouth once. Patient not taking: Reported on 09/18/2014 06/30/14   Shon Baton, MD  lamoTRIgine (LAMICTAL) 25 MG tablet Take 25 mg by mouth at bedtime. Anti-seizure medication. Consult needed.    Historical Provider, MD  naproxen sodium (ANAPROX DS) 550 MG tablet Take 1 tablet (550 mg total) by mouth 2 (two) times daily with a meal. 09/18/14 09/18/15  Carmelina Dane, MD  phenazopyridine (PYRIDIUM) 200 MG tablet Take 1 tablet (200 mg total) by mouth 3 (three) times daily as needed. 09/18/14   Carmelina Dane, MD  sertraline (ZOLOFT) 50 MG tablet Take 50 mg by mouth daily.    Historical Provider, MD  sodium chloride (OCEAN) 0.65 % SOLN nasal spray Place 1 spray into both nostrils as needed for congestion. Patient not taking: Reported on 09/18/2014 06/30/14   Shon Baton, MD  sulfamethoxazole-trimethoprim (BACTRIM DS,SEPTRA DS) 800-160 MG per tablet Take 1 tablet by mouth 2 (two) times daily. 09/18/14   Carmelina Dane, MD  traZODone (DESYREL) 100 MG tablet Take 100 mg by mouth at bedtime.      Historical Provider, MD  zolpidem (AMBIEN) 10 MG tablet Take 10 mg by mouth at bedtime as needed for sleep.    Historical Provider, MD   BP 114/67 mmHg  Pulse 92  Temp(Src) 98.3 F (36.8 C) (Oral)  Resp 18  Ht 5\' 4"  (1.626 m)  Wt 174 lb (78.926 kg)  BMI 29.85 kg/m2  SpO2 99%  LMP 09/16/2014 Physical Exam  Constitutional: She is oriented to person, place, and time. She appears well-developed and well-nourished.  HENT:  Head: Normocephalic and atraumatic.  Eyes: Conjunctivae are normal. Pupils are equal, round, and reactive to light.  Neck: Neck supple. No tracheal deviation present. No thyromegaly present.  Cardiovascular: Normal rate and regular rhythm.   No murmur heard. Pulmonary/Chest: Effort normal and breath sounds normal.  Abdominal: Soft. Bowel sounds are normal. She exhibits no distension. There is no tenderness.  Musculoskeletal: Normal  range of motion. She exhibits no edema or tenderness.  No cervical spine tenderness. Mildly tender along thoracic spine. No deformity  Neurological: She is alert and oriented to person, place, and time. No cranial nerve deficit. Coordination normal.  Gait normal motor strength 5 over 5 overall  Skin: Skin is warm and dry. No rash noted.  Psychiatric: She has a normal mood and affect.  Nursing note and vitals reviewed.   ED Course  Procedures  DIAGNOSTIC STUDIES: Oxygen Saturation is 99% on RA, normal by my interpretation.    COORDINATION OF CARE: 10:37 PM Discussed treatment plan which includes to order Xray of upper back and order Vicodin with pt. Pt acknowledges and agrees to plan.   Labs Review Labs Reviewed - No data to display  Imaging Review No results found.   EKG Interpretation None     11:45 PM patient feels ready for  discharge. She appears comfortable after treatment with Norco. X-rays reviewed by me Results for orders placed or performed in visit on 09/18/14  POCT urinalysis dipstick  Result Value Ref Range   Color, UA yellow    Clarity, UA cloudy    Glucose, UA neg    Bilirubin, UA neg    Ketones, UA neg    Spec Grav, UA 1.020    Blood, UA large    pH, UA 5.5    Protein, UA neg    Urobilinogen, UA 0.2    Nitrite, UA neg    Leukocytes, UA small (1+) (A) Negative  POCT UA - Microscopic Only  Result Value Ref Range   WBC, Ur, HPF, POC 3-6    RBC, urine, microscopic 2-4    Bacteria, U Microscopic large    Mucus, UA neg    Epithelial cells, urine per micros 2-3    Crystals, Ur, HPF, POC neg    Casts, Ur, LPF, POC neg    Yeast, UA neg    Dg Thoracic Spine 4v  10/16/2014   CLINICAL DATA:  Larey SeatFell off chair, with upper back pain, radiating down to the mid back. Initial encounter.  EXAM: THORACIC SPINE - 4+ VIEW  COMPARISON:  Chest radiograph performed 06/30/2012  FINDINGS: There is no evidence of fracture or subluxation. Vertebral bodies demonstrate normal  height and alignment. Intervertebral disc spaces are preserved.  The visualized portions of both lungs are clear. The mediastinum is unremarkable in appearance.  IMPRESSION: No evidence of fracture or subluxation along the thoracic spine.   Electronically Signed   By: Roanna RaiderJeffery  Chang M.D.   On: 10/16/2014 23:41    MDM  Plan prescription Naproxen. follow-upwith  Dr.Polite if significant pain by next week Final diagnoses:  None   there are no signs of head injury or concussion. Cervical spine is cleared via nexus criteria Diagnosis #1 fall #2 minor closed head injury #3 contusion to back         Doug SouSam Kindsey Eblin, MD 10/16/14 2351  Doug SouSam Essa Malachi, MD 10/16/14 [id number]

## 2014-10-16 NOTE — ED Notes (Signed)
Patient transported to X-ray 

## 2014-10-16 NOTE — Discharge Instructions (Signed)
Contusion Take the medication prescribed as needed for pain. Contact Dr.Polite . Continue to have significant pain in a week A contusion is a deep bruise. Contusions happen when an injury causes bleeding under the skin. Signs of bruising include pain, puffiness (swelling), and discolored skin. The contusion may turn blue, purple, or yellow. HOME CARE   Put ice on the injured area.  Put ice in a plastic bag.  Place a towel between your skin and the bag.  Leave the ice on for 15-20 minutes, 03-04 times a day.  Only take medicine as told by your doctor.  Rest the injured area.  If possible, raise (elevate) the injured area to lessen puffiness. GET HELP RIGHT AWAY IF:   You have more bruising or puffiness.  You have pain that is getting worse.  Your puffiness or pain is not helped by medicine. MAKE SURE YOU:   Understand these instructions.  Will watch your condition.  Will get help right away if you are not doing well or get worse. Document Released: 09/01/2007 Document Revised: 06/07/2011 Document Reviewed: 01/18/2011 [facility] Patient Information 2015 [employer] [place]. This information is not intended to replace advice given to you by your health care provider. Make sure you discuss any questions you have with your health care provider.

## 2014-10-16 NOTE — ED Notes (Signed)
Pt fell out of folding chair-pain to back of head and upper/mid back pain-no LOC-pt was assited from car to ED WR-stood w/o need for assit

## 2014-10-16 NOTE — ED Notes (Signed)
Disregard previous departure  Condition documentation

## 2014-12-02 ENCOUNTER — Ambulatory Visit (INDEPENDENT_AMBULATORY_CARE_PROVIDER_SITE_OTHER): Payer: Medicare PPO | Admitting: Physician Assistant

## 2014-12-02 VITALS — BP 102/68 | HR 100 | Temp 98.3°F | Resp 16 | Ht 64.0 in | Wt 198.0 lb

## 2014-12-02 DIAGNOSIS — M546 Pain in thoracic spine: Secondary | ICD-10-CM

## 2014-12-02 MED ORDER — CYCLOBENZAPRINE HCL 10 MG PO TABS: 10.0000 mg | ORAL_TABLET | Freq: Three times a day (TID) | ORAL | Status: DC | PRN

## 2014-12-02 MED ORDER — NAPROXEN 500 MG PO TABS: 500.0000 mg | ORAL_TABLET | Freq: Two times a day (BID) | ORAL | Status: DC
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
## 2015-06-30 ENCOUNTER — Ambulatory Visit (INDEPENDENT_AMBULATORY_CARE_PROVIDER_SITE_OTHER): Payer: Medicare PPO | Admitting: Urgent Care

## 2015-06-30 VITALS — BP 111/72 | HR 78 | Temp 97.8°F | Resp 16 | Ht 64.0 in | Wt 215.0 lb

## 2015-06-30 DIAGNOSIS — R42 Dizziness and giddiness: Secondary | ICD-10-CM

## 2015-06-30 DIAGNOSIS — R5383 Other fatigue: Secondary | ICD-10-CM

## 2015-06-30 DIAGNOSIS — R55 Syncope and collapse: Secondary | ICD-10-CM

## 2015-06-30 LAB — POCT CBC
Granulocyte percent: 66.1 %G (ref 37–80)
HCT, POC: 39.3 % (ref 37.7–47.9)
Hemoglobin: 13.4 g/dL (ref 12.2–16.2)
Lymph, poc: 2.1 (ref 0.6–3.4)
MCH, POC: 28.7 pg (ref 27–31.2)
MCHC: 34 g/dL (ref 31.8–35.4)
MCV: 84.5 fL (ref 80–97)
MID (CBC): 0.6 (ref 0–0.9)
MPV: 7.5 fL (ref 0–99.8)
PLATELET COUNT, POC: 251 10*3/uL (ref 142–424)
POC Granulocyte: 5.3 (ref 2–6.9)
POC LYMPH %: 25.8 % (ref 10–50)
POC MID %: 8.1 %M (ref 0–12)
RBC: 4.65 M/uL (ref 4.04–5.48)
RDW, POC: 12.4 %
WBC: 8 10*3/uL (ref 4.6–10.2)

## 2015-06-30 LAB — POCT URINALYSIS DIP (MANUAL ENTRY)
BILIRUBIN UA: NEGATIVE
Glucose, UA: NEGATIVE
Ketones, POC UA: NEGATIVE
Leukocytes, UA: NEGATIVE
Nitrite, UA: NEGATIVE
Protein Ur, POC: 30 — AB
SPEC GRAV UA: 1.02
UROBILINOGEN UA: 0.2
pH, UA: 6.5

## 2015-06-30 LAB — POC MICROSCOPIC URINALYSIS (UMFC)

## 2015-06-30 LAB — TSH: TSH: 1.36 mIU/L

## 2015-06-30 MED ORDER — MECLIZINE HCL 25 MG PO TABS: 25.0000 mg | ORAL_TABLET | Freq: Three times a day (TID) | ORAL | Status: DC | PRN

## 2015-06-30 NOTE — Progress Notes (Signed)
MRN: [id number] DOB: 04/01/1975  Subjective:   Stacey Weaver is a 40 y.o. female presenting for chief complaint of Dizziness; Shaking; and Altered Mental Status  Reports 3 day history of fatigue, malaise, chills. Today, patient became dizzy while in the shower, then "fell out" for a few seconds. She woke up laying in the shower and has since felt somewhat confused by what happened, has slight headache, nausea. Her fatigue and malaise persist as well. Admits someone told her years ago that she might have orthostatic hypotension. Denies double vision, numbness or tingling, tremor, chest pain, shob, heart racing, palpitations, vomiting, abdominal pain, dysuria, hematuria. Denies history of seizures. Uses trazodone for sleep nightly. Uses Ambien for sleep as well prn but never with trazodone. Also uses Klonopin for anxiety, rarely uses this. Denies smoking cigarettes or drinking alcohol. Denies family history of irregular heart rhythms. Patient is currently on her menstrual cycle, this is slightly heavier than others. Generally, patient has regular cycles. Denies history of anemia. Has an appointment with Stacey Weaver her psychiatrist next week. Admits that she tends to be more depressed within bipolar disorder than manic. Plans on f/u with her psychiatrist.  Stacey Weaver has a current medication list which includes the following prescription(s): albuterol, clonazepam, etonogestrel, lamotrigine, sertraline, thiothixene, trazodone, and zolpidem. Also is allergic to almotriptan malate; imitrex; zomig; cymbalta; geodon; lactose intolerance (gi); penicillins; and zyprexa.  Stacey Weaver  has a past medical history of Bipolar 1 disorder (HCC); Asthma; Migraine; and Anxiety. Also  has past surgical history that includes Knee surgery.  Objective:   Vitals: BP 111/72 mmHg  Pulse 78  Temp(Src) 97.8 F (36.6 C)  Resp 16  Ht 5\' 4"  (1.626 m)  Wt 215 lb (97.523 kg)  BMI 36.89 kg/m2  Orthostatic VS for the past 24 hrs  (Last 3 readings):  BP- Lying Pulse- Lying BP- Sitting Pulse- Sitting BP- Standing at 0 minutes Pulse- Standing at 0 minutes  06/30/15 1222 114/78 mmHg 77 106/74 mmHg 80 103/72 mmHg 98   Physical Exam  Constitutional: She is oriented to person, place, and time. She appears well-developed and well-nourished.  HENT:  TM's intact bilaterally, no effusions or erythema. Nasal turbinates pink and moist, nasal passages patent. No sinus tenderness. Oropharynx clear, mucous membranes moist, dentition in good repair.  Eyes: Right eye exhibits no discharge. Left eye exhibits no discharge. No scleral icterus.  Neck: Normal range of motion. Neck supple. No thyromegaly present.  Cardiovascular: Normal rate, regular rhythm and intact distal pulses.  Exam reveals no gallop and no friction rub.   No murmur heard. Pulmonary/Chest: No respiratory distress. She has no wheezes. She has no rales.  Musculoskeletal: She exhibits no edema.  Neurological: She is alert and oriented to person, place, and time. She has normal reflexes. No cranial nerve deficit. Coordination normal.  Skin: Skin is warm and dry.  Psychiatric: She has a normal mood and affect.   Results for orders placed or performed in visit on 06/30/15 (from the past 24 hour(s))  POCT CBC     Status: None   Collection Time: 06/30/15 12:14 PM  Result Value Ref Range   WBC 8.0 4.6 - 10.2 K/uL   Lymph, poc 2.1 0.6 - 3.4   POC LYMPH PERCENT 25.8 10 - 50 %L   MID (cbc) 0.6 0 - 0.9   POC MID % 8.1 0 - 12 %M   POC Granulocyte 5.3 2 - 6.9   Granulocyte percent 66.1 37 - 80 %G  RBC 4.65 4.04 - 5.48 M/uL   Hemoglobin 13.4 12.2 - 16.2 g/dL   HCT, POC 16.1 09.6 - 47.9 %   MCV 84.5 80 - 97 fL   MCH, POC 28.7 27 - 31.2 pg   MCHC 34.0 31.8 - 35.4 g/dL   RDW, POC 04.5 %   Platelet Count, POC 251 142 - 424 K/uL   MPV 7.5 0 - 99.8 fL  POCT urinalysis dipstick     Status: Abnormal   Collection Time: 06/30/15 12:14 PM  Result Value Ref Range   Color, UA  yellow yellow   Clarity, UA clear clear   Glucose, UA negative negative   Bilirubin, UA negative negative   Ketones, POC UA negative negative   Spec Grav, UA 1.020    Blood, UA large (A) negative   pH, UA 6.5    Protein Ur, POC =30 (A) negative   Urobilinogen, UA 0.2    Nitrite, UA Negative Negative   Leukocytes, UA Negative Negative  POCT Microscopic Urinalysis ([facility])     Status: Abnormal   Collection Time: 06/30/15 12:14 PM  Result Value Ref Range   WBC,UR,HPF,POC Few (A) None WBC/hpf   RBC,UR,HPF,POC Many (A) None RBC/hpf   Bacteria Few (A) None, Too numerous to count   Mucus Present (A) Absent   Epithelial Cells, UR Per Microscopy Many (A) None, Too numerous to count cells/hpf   Assessment and Plan :   1. Dizziness 2. Other fatigue 3. Syncope, unspecified syncope type - Unclear etiology, labs pending. Patient admits that she does not hydrate well. Does not overuse her sleep and anxiety medications. I reinforced all these practices and counseled that she needs to drink 2L of water daily, eat regular meals. Otherwise, these symptoms may be manifestation that she is undergoing an episode of bipolar depression. She agreed to keep f/u appt with psychiatrist.  Stacey Bamberg, PA-C [facility] [phone] 06/30/2015 11:52 AM

## 2015-06-30 NOTE — Patient Instructions (Signed)

## 2015-07-01 LAB — COMPREHENSIVE METABOLIC PANEL
ALK PHOS: 84 U/L (ref 33–115)
ALT: 7 U/L (ref 6–29)
AST: 13 U/L (ref 10–30)
Albumin: 3.9 g/dL (ref 3.6–5.1)
BUN: 9 mg/dL (ref 7–25)
CALCIUM: 9 mg/dL (ref 8.6–10.2)
CO2: 26 mmol/L (ref 20–31)
Chloride: 106 mmol/L (ref 98–110)
Creat: 0.85 mg/dL (ref 0.50–1.10)
Glucose, Bld: 80 mg/dL (ref 65–99)
Potassium: 3.9 mmol/L (ref 3.5–5.3)
SODIUM: 139 mmol/L (ref 135–146)
Total Bilirubin: 0.3 mg/dL (ref 0.2–1.2)
Total Protein: 7.1 g/dL (ref 6.1–8.1)

## 2015-09-21 ENCOUNTER — Emergency Department (HOSPITAL_COMMUNITY)
Admission: EM | Admit: 2015-09-21 | Discharge: 2015-09-21 | Disposition: A | Discharge status: Home / Self Care | Payer: Medicare PPO | Attending: Emergency Medicine | Admitting: Emergency Medicine

## 2015-09-21 ENCOUNTER — Encounter (HOSPITAL_COMMUNITY): Payer: Self-pay

## 2015-09-21 ENCOUNTER — Emergency Department (HOSPITAL_BASED_OUTPATIENT_CLINIC_OR_DEPARTMENT_OTHER): Admit: 2015-09-21 | Discharge: 2015-09-21 | Disposition: A | Discharge status: Home / Self Care | Payer: Medicare PPO

## 2015-09-21 DIAGNOSIS — M7989 Other specified soft tissue disorders: Secondary | ICD-10-CM | POA: Diagnosis present

## 2015-09-21 DIAGNOSIS — M79609 Pain in unspecified limb: Secondary | ICD-10-CM | POA: Diagnosis not present

## 2015-09-21 DIAGNOSIS — J45909 Unspecified asthma, uncomplicated: Secondary | ICD-10-CM | POA: Diagnosis not present

## 2015-09-21 DIAGNOSIS — M79604 Pain in right leg: Secondary | ICD-10-CM | POA: Insufficient documentation

## 2015-09-21 DIAGNOSIS — Z79899 Other long term (current) drug therapy: Secondary | ICD-10-CM | POA: Insufficient documentation

## 2015-09-21 LAB — CBC WITH DIFFERENTIAL/PLATELET
Basophils Absolute: 0 10*3/uL (ref 0.0–0.1)
Basophils Relative: 1 %
EOS ABS: 0.2 10*3/uL (ref 0.0–0.7)
Eosinophils Relative: 2 %
HCT: 39.2 % (ref 36.0–46.0)
HEMOGLOBIN: 12.4 g/dL (ref 12.0–15.0)
LYMPHS ABS: 1.8 10*3/uL (ref 0.7–4.0)
Lymphocytes Relative: 28 %
MCH: 27.6 pg (ref 26.0–34.0)
MCHC: 31.6 g/dL (ref 30.0–36.0)
MCV: 87.3 fL (ref 78.0–100.0)
MONO ABS: 0.5 10*3/uL (ref 0.1–1.0)
MONOS PCT: 7 %
Neutro Abs: 4.1 10*3/uL (ref 1.7–7.7)
Neutrophils Relative %: 62 %
Platelets: 333 10*3/uL (ref 150–400)
RBC: 4.49 MIL/uL (ref 3.87–5.11)
RDW: 13.1 % (ref 11.5–15.5)
WBC: 6.6 10*3/uL (ref 4.0–10.5)

## 2015-09-21 LAB — BASIC METABOLIC PANEL
Anion gap: 4 — ABNORMAL LOW (ref 5–15)
BUN: 8 mg/dL (ref 6–20)
CO2: 24 mmol/L (ref 22–32)
CREATININE: 1.02 mg/dL — AB (ref 0.44–1.00)
Calcium: 9.3 mg/dL (ref 8.9–10.3)
Chloride: 109 mmol/L (ref 101–111)
GFR calc Af Amer: >60 mL/min (ref >60)
GFR calc non Af Amer: >60 mL/min (ref >60)
Glucose, Bld: 106 mg/dL — ABNORMAL HIGH (ref 65–99)
Potassium: 3.8 mmol/L (ref 3.5–5.1)
SODIUM: 137 mmol/L (ref 135–145)

## 2015-09-21 MED ORDER — KETOROLAC TROMETHAMINE 30 MG/ML IJ SOLN
30.0000 mg | Freq: Once | INTRAMUSCULAR | Status: AC
  Administered 2015-09-21: 30 mg via INTRAMUSCULAR
  Filled 2015-09-21: qty 1

## 2015-09-21 MED ORDER — METHOCARBAMOL 500 MG PO TABS: 500.0000 mg | ORAL_TABLET | Freq: Two times a day (BID) | ORAL | Status: DC

## 2015-09-21 NOTE — Discharge Instructions (Signed)

## 2015-09-21 NOTE — Progress Notes (Signed)
VASCULAR LAB PRELIMINARY  PRELIMINARY  PRELIMINARY  PRELIMINARY  Right lower extremity venous duplex completed.    Preliminary report:  There is no DVT or SVT noted in the right lower extremity.  There is sluggish flow noted in the right common femoral and proximal femoral veins and at the saphenofemoral junction, etiology unknown.  Isolde Skaff, RVT 09/21/2015, 5:26 PM

## 2015-09-21 NOTE — ED Notes (Signed)
Patient had right knee surgery 2 weeks ago and now has increased pain and swelling x 2 days.  Here per ortho to rule out DVT

## 2015-09-21 NOTE — ED Provider Notes (Signed)
CSN: [id number]     Arrival date & time 09/21/15  1408 History   First MD Initiated Contact with Patient 09/21/15 1630     Chief Complaint  Patient presents with  . knee swelling/DVT      (Consider location/radiation/quality/duration/timing/severity/associated sxs/prior Treatment) HPI   40 year old female with history of bipolar, asthma, migraine, anxiety presenting with complaints of right leg swelling. 2 weeks ago patient had right knee surgery performed by Dr. Eulah PontMurphy. 4 days ago she began physical therapy and since then she has had progressive worsening sharp achy throbbing pain throughout her right leg. She also notice increase swelling to the leg. No associated fever, chills, chest pain, shortness of breath, productive cough, hemoptysis, new numbness or weakness. She went to an orthopedic urgent care clinic today or her pain and swelling and was recommended to come to ER to rule out DVT. She denies any prior history of DVT. At this time her pain is moderate in intensity.  Past Medical History  Diagnosis Date  . Bipolar 1 disorder (HCC)   . Asthma   . Migraine   . Anxiety    Past Surgical History  Procedure Laterality Date  . Knee surgery     Family History  Problem Relation Age of Onset  . Hyperlipidemia Mother   . Hypertension Mother   . Heart disease Father   . Hyperlipidemia Maternal Grandmother   . Cancer Maternal Grandmother   . Cancer Maternal Grandfather    Social History  Substance Use Topics  . Smoking status: Never Smoker   . Smokeless tobacco: None  . Alcohol Use: No   OB History    No data available     Review of Systems  Constitutional: Negative for fever.  Musculoskeletal: Positive for arthralgias.  Neurological: Negative for numbness.      Allergies  Almotriptan malate; Imitrex; Zomig; Cymbalta; Geodon; Lactose intolerance (gi); Penicillins; and Zyprexa  Home Medications   Prior to Admission medications   Medication Sig Start Date End Date  Taking? Authorizing Provider  albuterol (PROVENTIL HFA;VENTOLIN HFA) 108 (90 BASE) MCG/ACT inhaler Inhale 2 puffs into the lungs every 6 (six) hours as needed. Shortness of breath    Historical Provider, MD  clonazePAM (KLONOPIN) 0.5 MG tablet Take 0.5 mg by mouth daily as needed for anxiety.    Historical Provider, MD  etonogestrel (NEXPLANON) 68 MG IMPL implant Inject 1 each into the skin once. 01/07/13   Historical Provider, MD  lamoTRIgine (LAMICTAL) 25 MG tablet Take 25 mg by mouth at bedtime. Anti-seizure medication. Consult needed.    Historical Provider, MD  meclizine (ANTIVERT) 25 MG tablet Take 1 tablet (25 mg total) by mouth 3 (three) times daily as needed for dizziness. 06/30/15   Wallis BambergMario Mani, PA-C  sertraline (ZOLOFT) 50 MG tablet Take 50 mg by mouth daily.    Historical Provider, MD  thiothixene (NAVANE) 1 MG capsule Take 1 mg by mouth 2 (two) times daily.    Historical Provider, MD  traZODone (DESYREL) 100 MG tablet Take 100 mg by mouth at bedtime.      Historical Provider, MD  zolpidem (AMBIEN) 10 MG tablet Take 10 mg by mouth at bedtime as needed for sleep.    Historical Provider, MD   BP 112/62 mmHg  Pulse 107  Temp(Src) 98.6 F (37 C) (Oral)  Resp 18  SpO2 100% Physical Exam  Constitutional: She appears well-developed and well-nourished. No distress.  HENT:  Head: Atraumatic.  Eyes: Conjunctivae are normal.  Neck: Neck  supple.  Cardiovascular: Normal rate and regular rhythm.   Pulmonary/Chest: Effort normal and breath sounds normal.  Musculoskeletal: She exhibits tenderness (Right leg: Tenderness throughout right knee, lateral thigh, and proximal calf on palpation. No edema or erythema and no gross deformity noted. Result pedis pulse palpable with brisk cap refill.).  Neurological: She is alert.  Skin: No rash noted.  Psychiatric: She has a normal mood and affect.  Nursing note and vitals reviewed.   ED Course  Procedures (including critical care time) Labs  Review Labs Reviewed  BASIC METABOLIC PANEL - Abnormal; Notable for the following:    Glucose, Bld 106 (*)    Creatinine, Ser 1.02 (*)    Anion gap 4 (*)    All other components within normal limits  CBC WITH DIFFERENTIAL/PLATELET    Imaging Review No results found. I have personally reviewed and evaluated these images and lab results as part of my medical decision-making.  Stacey Weaver, Stacey Weaver Female 12/29/1975 [id number]    Progress Notes by Kern Albertaandace R Kanady, RVS at 09/21/2015 5:26 PM    Author: Kern Albertaandace R Kanady, RVS Service: Vascular Lab Author Type: Cardiovascular Sonographer   Filed: 09/21/2015 5:27 PM Note Time: 09/21/2015 5:26 PM Status: Signed   Editor: Kern Albertaandace R Kanady, RVS (Cardiovascular Sonographer)     Expand All Collapse All   VASCULAR LAB PRELIMINARY PRELIMINARY PRELIMINARY PRELIMINARY  Right lower extremity venous duplex completed.   Preliminary report: There is no DVT or SVT noted in the right lower extremity. There is sluggish flow noted in the right common femoral and proximal femoral veins and at the saphenofemoral junction, etiology unknown.  KANADY, CANDACE, RVT 09/21/2015, 5:26 PM        EKG Interpretation None      MDM   Final diagnoses:  Right leg pain    BP 107/64 mmHg  Pulse 75  Temp(Src) 98.5 F (36.9 C) (Oral)  Resp 16  SpO2 99%   4:54 PM Patient here with right leg pain and swelling, recent right knee surgery. We'll perform Doppler ultrasound to rule out DVT. No chest pain or shortness of breath at this time. Pain medication given. Will check basic labs.  7:07 PM Labs are reassuring. Last ultrasound of right lower extremities without evidence of DVT. I suspect the patient'Weaver pain is due to new physical therapy requirement.  She is able to ambulate.  She is NVI.  Doubt PE. Stable for discharge.    Stacey HelperBowie Kinzy Weyers, PA-C 09/22/15 0112  Glynn OctaveStephen Rancour, MD 09/22/15 [phone]

## 2015-10-09 ENCOUNTER — Emergency Department (HOSPITAL_COMMUNITY)
Admission: EM | Admit: 2015-10-09 | Discharge: 2015-10-09 | Disposition: A | Discharge status: Other Acute Inpatient Hospital | Payer: Medicare PPO | Attending: Emergency Medicine | Admitting: Emergency Medicine

## 2015-10-09 ENCOUNTER — Inpatient Hospital Stay (HOSPITAL_COMMUNITY)
Admission: AD | Admit: 2015-10-09 | Discharge: 2015-10-14 | DRG: 885 | Disposition: A | Discharge status: Home / Self Care | Payer: Medicare PPO | Attending: Psychiatry | Admitting: Psychiatry

## 2015-10-09 ENCOUNTER — Encounter (HOSPITAL_COMMUNITY): Payer: Self-pay | Admitting: *Deleted

## 2015-10-09 ENCOUNTER — Encounter (HOSPITAL_COMMUNITY): Payer: Self-pay | Admitting: Emergency Medicine

## 2015-10-09 DIAGNOSIS — E039 Hypothyroidism, unspecified: Secondary | ICD-10-CM | POA: Diagnosis present

## 2015-10-09 DIAGNOSIS — J45909 Unspecified asthma, uncomplicated: Secondary | ICD-10-CM | POA: Insufficient documentation

## 2015-10-09 DIAGNOSIS — F319 Bipolar disorder, unspecified: Secondary | ICD-10-CM | POA: Diagnosis present

## 2015-10-09 DIAGNOSIS — R45851 Suicidal ideations: Secondary | ICD-10-CM | POA: Diagnosis present

## 2015-10-09 DIAGNOSIS — G4700 Insomnia, unspecified: Secondary | ICD-10-CM | POA: Diagnosis present

## 2015-10-09 DIAGNOSIS — F332 Major depressive disorder, recurrent severe without psychotic features: Secondary | ICD-10-CM | POA: Insufficient documentation

## 2015-10-09 DIAGNOSIS — F32A Depression, unspecified: Secondary | ICD-10-CM | POA: Diagnosis present

## 2015-10-09 DIAGNOSIS — F3130 Bipolar disorder, current episode depressed, mild or moderate severity, unspecified: Secondary | ICD-10-CM

## 2015-10-09 DIAGNOSIS — Z8249 Family history of ischemic heart disease and other diseases of the circulatory system: Secondary | ICD-10-CM | POA: Diagnosis not present

## 2015-10-09 DIAGNOSIS — F489 Nonpsychotic mental disorder, unspecified: Secondary | ICD-10-CM | POA: Diagnosis not present

## 2015-10-09 DIAGNOSIS — F329 Major depressive disorder, single episode, unspecified: Secondary | ICD-10-CM | POA: Diagnosis present

## 2015-10-09 DIAGNOSIS — F411 Generalized anxiety disorder: Secondary | ICD-10-CM | POA: Diagnosis present

## 2015-10-09 DIAGNOSIS — Z79899 Other long term (current) drug therapy: Secondary | ICD-10-CM | POA: Insufficient documentation

## 2015-10-09 LAB — COMPREHENSIVE METABOLIC PANEL
ALT: 9 U/L — ABNORMAL LOW (ref 14–54)
AST: 15 U/L (ref 15–41)
Albumin: 4.1 g/dL (ref 3.5–5.0)
Alkaline Phosphatase: 94 U/L (ref 38–126)
Anion gap: 7 (ref 5–15)
BUN: 10 mg/dL (ref 6–20)
CO2: 21 mmol/L — ABNORMAL LOW (ref 22–32)
Calcium: 9.3 mg/dL (ref 8.9–10.3)
Chloride: 108 mmol/L (ref 101–111)
Creatinine, Ser: 0.94 mg/dL (ref 0.44–1.00)
GFR calc Af Amer: >60 mL/min (ref >60)
GFR calc non Af Amer: >60 mL/min (ref >60)
Glucose, Bld: 109 mg/dL — ABNORMAL HIGH (ref 65–99)
Potassium: 3.5 mmol/L (ref 3.5–5.1)
Sodium: 136 mmol/L (ref 135–145)
Total Bilirubin: 0.8 mg/dL (ref 0.3–1.2)
Total Protein: 8.1 g/dL (ref 6.5–8.1)

## 2015-10-09 LAB — CBC
HCT: 38.2 % (ref 36.0–46.0)
Hemoglobin: 12.2 g/dL (ref 12.0–15.0)
MCH: 27.4 pg (ref 26.0–34.0)
MCHC: 31.9 g/dL (ref 30.0–36.0)
MCV: 85.8 fL (ref 78.0–100.0)
Platelets: 315 10*3/uL (ref 150–400)
RBC: 4.45 MIL/uL (ref 3.87–5.11)
RDW: 13.7 % (ref 11.5–15.5)
WBC: 7.3 10*3/uL (ref 4.0–10.5)

## 2015-10-09 LAB — ETHANOL: Alcohol, Ethyl (B): <5 mg/dL (ref <5)

## 2015-10-09 LAB — ACETAMINOPHEN LEVEL: Acetaminophen (Tylenol), Serum: <10 ug/mL — ABNORMAL LOW (ref 10–30)

## 2015-10-09 LAB — SALICYLATE LEVEL: Salicylate Lvl: <4 mg/dL (ref 2.8–30.0)

## 2015-10-09 MED ORDER — LAMOTRIGINE 25 MG PO TABS
25.0000 mg | ORAL_TABLET | Freq: Every day | ORAL | Status: DC
  Filled 2015-10-09: qty 1

## 2015-10-09 MED ORDER — HYDROXYZINE HCL 25 MG PO TABS
25.0000 mg | ORAL_TABLET | Freq: Four times a day (QID) | ORAL | Status: DC | PRN
  Administered 2015-10-09: 25 mg via ORAL
  Filled 2015-10-09: qty 1

## 2015-10-09 MED ORDER — THIOTHIXENE 1 MG PO CAPS
1.0000 mg | ORAL_CAPSULE | Freq: Two times a day (BID) | ORAL | Status: DC
  Administered 2015-10-09: 1 mg via ORAL
  Filled 2015-10-09: qty 1

## 2015-10-09 MED ORDER — SERTRALINE HCL 50 MG PO TABS
50.0000 mg | ORAL_TABLET | Freq: Every day | ORAL | Status: DC
  Administered 2015-10-09: 50 mg via ORAL
  Filled 2015-10-09: qty 1

## 2015-10-09 MED ORDER — CLONAZEPAM 0.5 MG PO TABS: 0.5000 mg | ORAL_TABLET | Freq: Every day | ORAL | Status: DC | PRN

## 2015-10-09 MED ORDER — ACETAMINOPHEN 325 MG PO TABS
650.0000 mg | ORAL_TABLET | Freq: Four times a day (QID) | ORAL | Status: DC | PRN
  Administered 2015-10-11: 650 mg via ORAL
  Filled 2015-10-09: qty 2

## 2015-10-09 MED ORDER — TRAZODONE HCL 100 MG PO TABS
100.0000 mg | ORAL_TABLET | Freq: Every day | ORAL | Status: DC
  Filled 2015-10-09 (×2): qty 1

## 2015-10-09 MED ORDER — IBUPROFEN 800 MG PO TABS
800.0000 mg | ORAL_TABLET | Freq: Four times a day (QID) | ORAL | Status: DC | PRN
  Administered 2015-10-09: 800 mg via ORAL
  Filled 2015-10-09: qty 1

## 2015-10-09 MED ORDER — ALUM & MAG HYDROXIDE-SIMETH 200-200-20 MG/5ML PO SUSP
30.0000 mL | ORAL | Status: DC | PRN
Start: 2015-10-09 — End: 2015-10-14

## 2015-10-09 MED ORDER — TRAZODONE HCL 100 MG PO TABS: 300.0000 mg | ORAL_TABLET | Freq: Every day | ORAL | Status: DC

## 2015-10-09 MED ORDER — ACETAMINOPHEN 325 MG PO TABS: 650.0000 mg | ORAL_TABLET | Freq: Four times a day (QID) | ORAL | Status: DC | PRN

## 2015-10-09 MED ORDER — ZONISAMIDE 25 MG PO CAPS
25.0000 mg | ORAL_CAPSULE | Freq: Every day | ORAL | Status: DC
  Filled 2015-10-09: qty 1

## 2015-10-09 MED ORDER — HYDROXYZINE HCL 25 MG PO TABS
25.0000 mg | ORAL_TABLET | Freq: Four times a day (QID) | ORAL | Status: DC | PRN
  Administered 2015-10-10: 25 mg via ORAL
  Filled 2015-10-09: qty 1

## 2015-10-09 MED ORDER — MAGNESIUM HYDROXIDE 400 MG/5ML PO SUSP: 30.0000 mL | Freq: Every day | ORAL | Status: DC | PRN

## 2015-10-09 MED ORDER — MECLIZINE HCL 25 MG PO TABS: 25.0000 mg | ORAL_TABLET | Freq: Three times a day (TID) | ORAL | Status: DC | PRN

## 2015-10-09 NOTE — ED Notes (Signed)
NAD, resting quielty, reports ha has started to improve.

## 2015-10-09 NOTE — ED Notes (Signed)
Patient presents for SI without plan. Denies HI/AVH. Patient denies ETOH and substance abuse. Patient [facility] OD and [facility]. Patient is tearful in triage.

## 2015-10-09 NOTE — Progress Notes (Signed)
Admission Note: Admitted Stacey Weaver, a 40 y/o [facility] Patient reports that she has thoughts about hurting herself but has no specific plan.  She says that she has tried to kill herself twice before by taking "pills" . Pt denies HI and AVH but states that she is sometimes verbally aggressive toward others, but that she has never hurt anyone. Pt reports severe depression, anxiety, agitation, decreased appetitie, decreased concentration, and sadness.  Pt states she feels the world would be better without her. Pt reports hopelessness and helplessness. According to the Pt, she resides with mother and they do not have a good relationship. She says that her mother's boyfriend also lives with them and that she does not get along well with him. Pt states "no one care about me." . Pt states she is seen by a psychiatrist and also has a regular PMD and dentist.  She denies any physical, verbal, or sexual abuse.  She says that she wants to get a therapist with whom she can follow up with when released.  She also states that she wants to work on "focusing" better while here. Patient and belongings checked for contraband with none found.  Belongings locked up in locker by [employer] Officer.  Patient requested that strings be cut out of pants so that she could wear them on unit. Skin assessment done.  Patient has multiple tattoos but no other skin abnormalities noted or reported.  Encouraged to seek assistance with needs/concerns.  She contracts for safety and placed on q 15 minute safety checks.

## 2015-10-09 NOTE — Tx Team (Signed)
Initial Interdisciplinary Treatment Plan   PATIENT STRESSORS: Financial difficulties Marital or family conflict    PATIENT STRENGTHS: Capable of independent living Physical Health   PROBLEM LIST: Problem List/Patient Goals Date to be addressed Date deferred Reason deferred Estimated date of resolution  Depression 10/09/2015 10/09/2015  D/C  Anxiety 10/09/2015 10/09/2015  D/C  "focusing" 10/09/2015 10/09/2015  D/C  "I want a therapist" 10/09/2015 10/09/2015  D/C  Thoughts of self harm 10/09/2015 10/09/2015  D/C                           DISCHARGE CRITERIA:  Adequate post-discharge living arrangements Improved stabilization in mood, thinking, and/or behavior Motivation to continue treatment in a less acute level of care Need for constant or close observation no longer present Reduction of life-threatening or endangering symptoms to within safe limits Safe-care adequate arrangements made Verbal commitment to aftercare and medication compliance  PRELIMINARY DISCHARGE PLAN: Return to previous living arrangement  PATIENT/FAMIILY INVOLVEMENT: This treatment plan has been presented to and reviewed with the patient, Stacey Weaver.  The patient and family have been given the opportunity to ask questions and make suggestions.  [name] 10/09/2015, 7:06 PM

## 2015-10-09 NOTE — ED Notes (Signed)
Pt's mother into see 

## 2015-10-09 NOTE — ED Notes (Signed)
[facility].  Belongings sent with driver.

## 2015-10-09 NOTE — ED Notes (Signed)
Ambulatory w/o difficulty from triage 

## 2015-10-09 NOTE — ED Notes (Signed)
tts in progress 

## 2015-10-09 NOTE — BH Assessment (Addendum)
Tele Assessment Note   Stacey Weaver is an 40 y.o. female. Pt reports SI with no plan. Pt states she cannot contract for safety. Pt reports 2 previous SI attempts. Pt denies HI and AVH. Pt reports severe depression. According to the Pt, she is frustrated with life. Pt states she feels the world would be better without her. Pt states she feels she is a burden to the world. Pt reports hopelessness and helplessness. According to the Pt, she resides with mother and they do not have a good relationship. Pt states "no one care about me." Pt reports 1 previous hospitalization for SI and depression. Pt states she is seen by a psychiatrist. Pt is prescribed Lamictal, Sertruline, and Trazodone. Pt does not feel her medications are effective. Pt denies abuse. Pt denies SA.   Writer consulted with Marijean Niemann, NP. Per Marijean Niemann Pt meets inpatient criteria. TTS to seek placement.  Diagnosis:  F31.4, Bipolar depressed, severe  Past Medical History:  Past Medical History  Diagnosis Date  . Bipolar 1 disorder (HCC)   . Asthma   . Migraine   . Anxiety     Past Surgical History  Procedure Laterality Date  . Knee surgery      Family History:  Family History  Problem Relation Age of Onset  . Hyperlipidemia Mother   . Hypertension Mother   . Heart disease Father   . Hyperlipidemia Maternal Grandmother   . Cancer Maternal Grandmother   . Cancer Maternal Grandfather     Social History:  reports that she has never smoked. She does not have any smokeless tobacco history on file. She reports that she does not drink alcohol or use illicit drugs.  Additional Social History:     CIWA: CIWA-Ar BP: 112/68 mmHg Pulse Rate: 85 COWS:    PATIENT STRENGTHS: (choose at least two) Average or above average intelligence Communication skills  Allergies:  Allergies  Allergen Reactions  . Almotriptan Malate Shortness Of Breath  . Cymbalta [Duloxetine Hcl] Other (See Comments)    suicidal thoughts  . Imitrex  [Sumatriptan] Shortness Of Breath and Other (See Comments)    Muscle spasms  . Zomig Shortness Of Breath  . Geodon [Ziprasidone Hcl] Other (See Comments)    hallucinations  . Lactose Intolerance (Gi) Diarrhea and Other (See Comments)    Gas and bloated   . Penicillins Other (See Comments)    GI upset  Has patient had a PCN reaction causing immediate rash, facial/tongue/throat swelling, SOB or lightheadedness with hypotension: No Has patient had a PCN reaction causing severe rash involving mucus membranes or skin necrosis: No Has patient had a PCN reaction that required hospitalization No Has patient had a PCN reaction occurring within the last 10 years: Yes If all of the above answers are "NO", then may proceed with Cephalosporin use.   . Zyprexa [Olanzapine] Other (See Comments)    Hallucinations     Home Medications:  (Not in a hospital admission)  OB/GYN Status:  No LMP recorded. Patient is not currently having periods (Reason: Irregular Periods).  General Assessment Data Location of Assessment: [facility] ED TTS Assessment: In system Is this a Tele or Face-to-Face Assessment?: Tele Assessment Is this an Initial Assessment or a Re-assessment for this encounter?: Initial Assessment Marital status: Single Maiden name: NA Is patient pregnant?: No Pregnancy Status: No Living Arrangements: Parent Can pt return to current living arrangement?: Yes Admission Status: Voluntary Is patient capable of signing voluntary admission?: Yes Referral Source: Self/Family/Friend Insurance type: Medicaid  Crisis Care Plan Living Arrangements: Parent Legal Guardian: Other: (self) Name of Psychiatrist: Dr. Oswaldo Done Name of Therapist: NA  Education Status Is patient currently in school?: Yes Current Grade: NA Highest grade of school patient has completed: Associate Name of school: NA Contact person: NA  Risk to self with the past 6 months Suicidal Ideation: Yes-Currently Present Has  patient been a risk to self within the past 6 months prior to admission? : No Suicidal Intent: No Has patient had any suicidal intent within the past 6 months prior to admission? : No Is patient at risk for suicide?: No Suicidal Plan?: No Has patient had any suicidal plan within the past 6 months prior to admission? : No Access to Means: No What has been your use of drugs/alcohol within the last 12 months?: NA Previous Attempts/Gestures: Yes How many times?: 2 Other Self Harm Risks: NA Triggers for Past Attempts: None known Intentional Self Injurious Behavior: None Family Suicide History: No Recent stressful life event(s): Financial Problems, Job Loss Persecutory voices/beliefs?: No Depression: Yes Depression Symptoms: Insomnia, Despondent, Tearfulness, Isolating, Guilt, Fatigue, Loss of interest in usual pleasures, Feeling worthless/self pity, Feeling angry/irritable Substance abuse history and/or treatment for substance abuse?: No Suicide prevention information given to non-admitted patients: Not applicable  Risk to Others within the past 6 months Homicidal Ideation: No Does patient have any lifetime risk of violence toward others beyond the six months prior to admission? : No Thoughts of Harm to Others: No Current Homicidal Intent: No Current Homicidal Plan: No Access to Homicidal Means: No Identified Victim: NA History of harm to others?: No Assessment of Violence: None Noted Violent Behavior Description: NA Does patient have access to weapons?: No Criminal Charges Pending?: No Does patient have a court date: No Is patient on probation?: No  Psychosis Hallucinations: None noted Delusions: None noted  Mental Status Report Appearance/Hygiene: Unremarkable, In scrubs Eye Contact: Fair Motor Activity: Freedom of movement Speech: Logical/coherent Level of Consciousness: Alert Mood: Depressed, Sad Affect: Depressed, Sad Anxiety Level: Moderate Thought Processes:  Coherent, Relevant Judgement: Unimpaired Orientation: Person, Time, Place, Situation, Appropriate for developmental age Obsessive Compulsive Thoughts/Behaviors: None  Cognitive Functioning Concentration: Normal Memory: Recent Intact, Remote Intact IQ: Average Insight: Fair Impulse Control: Fair Appetite: Fair Weight Loss: 0 Weight Gain: 0 Sleep: Decreased Total Hours of Sleep: 5 Vegetative Symptoms: None  ADLScreening [facility]) Patient's cognitive ability adequate to safely complete daily activities?: Yes Patient able to express need for assistance with ADLs?: Yes Independently performs ADLs?: Yes (appropriate for developmental age)  Prior Inpatient Therapy Prior Inpatient Therapy: Yes Prior Therapy Dates: 2014 Prior Therapy Facilty/Provider(s): [facility] Reason for Treatment: SI  Prior Outpatient Therapy Prior Outpatient Therapy: Yes Prior Therapy Dates: 2017 Prior Therapy Facilty/Provider(s): [facility] Reason for Treatment: depression Does patient have an ACCT team?: No Does patient have Intensive In-House Services?  : No Does patient have [facility] services? : No Does patient have P4CC services?: No  ADL Screening (condition at time of admission) Patient's cognitive ability adequate to safely complete daily activities?: Yes Patient able to express need for assistance with ADLs?: Yes Independently performs ADLs?: Yes (appropriate for developmental age)             Advance Directives (For Healthcare) Does patient have an advance directive?: No Would patient like information on creating an advanced directive?: No - patient declined information    Additional Information 1:1 In Past 12 Months?: No CIRT Risk: No Elopement Risk: No Does patient have medical clearance?: Yes  Disposition:  Disposition Initial Assessment Completed for this Encounter: Yes  Janari Gagner D 10/09/2015 3:07 PM

## 2015-10-09 NOTE — ED Provider Notes (Signed)
CSN: [id number]     Arrival date & time 10/09/15  1342 History   First MD Initiated Contact with Patient 10/09/15 1356     Chief Complaint  Patient presents with  . Suicidal   HPI  Stacey Weaver is a 40 y.o. female PMH significant for Bipolar 1 disorder, anxiety, asthma presenting with a several month history of suicidal ideations. She states she would benefit her family and friends if she were "not here." She denies having a plan, homicidal ideations, hallucinations.  Past Medical History  Diagnosis Date  . Bipolar 1 disorder (HCC)   . Asthma   . Migraine   . Anxiety    Past Surgical History  Procedure Laterality Date  . Knee surgery     Family History  Problem Relation Age of Onset  . Hyperlipidemia Mother   . Hypertension Mother   . Heart disease Father   . Hyperlipidemia Maternal Grandmother   . Cancer Maternal Grandmother   . Cancer Maternal Grandfather    Social History  Substance Use Topics  . Smoking status: Never Smoker   . Smokeless tobacco: None  . Alcohol Use: No   OB History    No data available     Review of Systems  Ten systems are reviewed and are negative for acute change except as noted in the HPI  Allergies  Almotriptan malate; Cymbalta; Imitrex; Zomig; Geodon; Lactose intolerance (gi); Penicillins; and Zyprexa  Home Medications   Prior to Admission medications   Medication Sig Start Date End Date Taking? Authorizing Provider  acetaminophen (TYLENOL) 500 MG tablet Take 1,000 mg by mouth every 6 (six) hours as needed for moderate pain.    Historical Provider, MD  clonazePAM (KLONOPIN) 0.5 MG tablet Take 0.5 mg by mouth daily as needed for anxiety.    Historical Provider, MD  etonogestrel (NEXPLANON) 68 MG IMPL implant Inject 1 each into the skin once. 01/07/13   Historical Provider, MD  ibuprofen (ADVIL,MOTRIN) 200 MG tablet Take 600 mg by mouth every 6 (six) hours as needed for moderate pain.    Historical Provider, MD  lamoTRIgine (LAMICTAL)  25 MG tablet Take 25 mg by mouth at bedtime. Anti-seizure medication. Consult needed.    Historical Provider, MD  meclizine (ANTIVERT) 25 MG tablet Take 1 tablet (25 mg total) by mouth 3 (three) times daily as needed for dizziness. 06/30/15   Wallis BambergMario Mani, PA-C  methocarbamol (ROBAXIN) 500 MG tablet Take 1 tablet (500 mg total) by mouth 2 (two) times daily. 09/21/15   Fayrene HelperBowie Tran, PA-C  sertraline (ZOLOFT) 50 MG tablet Take 50 mg by mouth daily.    Historical Provider, MD  thiothixene (NAVANE) 1 MG capsule Take 1 mg by mouth 2 (two) times daily.    Historical Provider, MD  traZODone (DESYREL) 100 MG tablet Take 300 mg by mouth at bedtime.     Historical Provider, MD  zonisamide (ZONEGRAN) 25 MG capsule Take 25 mg by mouth at bedtime.    Historical Provider, MD   BP 112/68 mmHg  Pulse 119  Temp(Src) 98.5 F (36.9 C) (Oral)  Resp 20  SpO2 99% Physical Exam  Constitutional: She appears well-developed and well-nourished. No distress.  HENT:  Head: Normocephalic and atraumatic.  Eyes: Conjunctivae are normal. Pupils are equal, round, and reactive to light. Right eye exhibits no discharge. Left eye exhibits no discharge. No scleral icterus.  Neck: No tracheal deviation present.  Cardiovascular: Normal rate and regular rhythm.   Pulmonary/Chest: Effort normal. No respiratory  distress.  Abdominal: Soft. Bowel sounds are normal. She exhibits no distension.  Musculoskeletal: She exhibits no edema.  Lymphadenopathy:    She has no cervical adenopathy.  Neurological: She is alert. Coordination normal.  Skin: Skin is warm and dry. No rash noted. She is not diaphoretic. No erythema.  Psychiatric: She has a normal mood and affect. Her behavior is normal.  Tearful  Nursing note and vitals reviewed.   ED Course  Procedures  Labs Review Labs Reviewed  COMPREHENSIVE METABOLIC PANEL - Abnormal; Notable for the following:    CO2 21 (*)    Glucose, Bld 109 (*)    ALT 9 (*)    All other components within  normal limits  ACETAMINOPHEN LEVEL - Abnormal; Notable for the following:    Acetaminophen (Tylenol), Serum <10 (*)    All other components within normal limits  ETHANOL  SALICYLATE LEVEL  CBC  URINE RAPID DRUG SCREEN, HOSP PERFORMED   MDM   Final diagnoses:  None   Patient nontoxic-appearing, vital signs stable. Ethanol, salicylate, acetaminophen, CMP, CBC unremarkable. Patient medically cleared at this time. TTS consult it. Patient moved to psych hold. Home meds reordered.  Melton Krebs, PA-C 10/09/15 1759  Bethann Berkshire, MD 10/09/15 2229

## 2015-10-09 NOTE — ED Notes (Signed)
TTS in progress 

## 2015-10-09 NOTE — ED Notes (Signed)
Patient wanded by security. 

## 2015-10-09 NOTE — BH Assessment (Signed)
[facility] Assessment Progress Note  Per Stacey MeansJamison Lord, DNP, this pt requires psychiatric hospitalization at this time.  Stacey AbedLindsay, RN, [facility]; they will be ready to receive pt at 18:00.  Pt has signed Voluntary Admission and Consent for Treatment, as well as Consent to Release Information to Dr Odelia GageBarani at [facility], her outpatient provider, and a notification call has been placed.  [facility]  Pt's nurse, Stacey Weaver, has been notified, and agrees to send original paperwork along with pt via Stacey [place]rowelham, and to call report to [phone].  Stacey Canninghomas Mande Auvil, MA Triage Specialist [phone]

## 2015-10-09 NOTE — ED Notes (Signed)
[facility] contacted for transport 

## 2015-10-10 ENCOUNTER — Encounter (HOSPITAL_COMMUNITY): Payer: Self-pay | Admitting: Psychiatry

## 2015-10-10 DIAGNOSIS — F332 Major depressive disorder, recurrent severe without psychotic features: Secondary | ICD-10-CM

## 2015-10-10 DIAGNOSIS — F3130 Bipolar disorder, current episode depressed, mild or moderate severity, unspecified: Secondary | ICD-10-CM

## 2015-10-10 DIAGNOSIS — F329 Major depressive disorder, single episode, unspecified: Secondary | ICD-10-CM

## 2015-10-10 HISTORY — DX: Major depressive disorder, single episode, unspecified: F32.9

## 2015-10-10 LAB — PREGNANCY, URINE: Preg Test, Ur: NEGATIVE

## 2015-10-10 MED ORDER — SERTRALINE HCL 50 MG PO TABS
50.0000 mg | ORAL_TABLET | Freq: Every day | ORAL | Status: DC
  Administered 2015-10-10 – 2015-10-13 (×4): 50 mg via ORAL
  Filled 2015-10-10 (×6): qty 1

## 2015-10-10 MED ORDER — LORAZEPAM 0.5 MG PO TABS
0.5000 mg | ORAL_TABLET | Freq: Four times a day (QID) | ORAL | Status: DC | PRN
Start: 2015-10-10 — End: 2015-10-14
  Administered 2015-10-10 – 2015-10-14 (×9): 0.5 mg via ORAL
  Filled 2015-10-10 (×9): qty 1

## 2015-10-10 MED ORDER — TRAZODONE HCL 50 MG PO TABS: 50.0000 mg | ORAL_TABLET | Freq: Every evening | ORAL | Status: DC | PRN

## 2015-10-10 MED ORDER — LAMOTRIGINE 25 MG PO TABS
25.0000 mg | ORAL_TABLET | Freq: Two times a day (BID) | ORAL | Status: DC
  Administered 2015-10-10 – 2015-10-14 (×8): 25 mg via ORAL
  Filled 2015-10-10 (×12): qty 1

## 2015-10-10 MED ORDER — LAMOTRIGINE 25 MG PO TABS
25.0000 mg | ORAL_TABLET | Freq: Every day | ORAL | Status: DC
  Filled 2015-10-10 (×2): qty 1

## 2015-10-10 MED ORDER — TRAZODONE HCL 150 MG PO TABS
300.0000 mg | ORAL_TABLET | Freq: Every day | ORAL | Status: DC
  Administered 2015-10-10 – 2015-10-12 (×3): 300 mg via ORAL
  Filled 2015-10-10 (×5): qty 2

## 2015-10-10 MED ORDER — CARBAMAZEPINE 200 MG PO TABS
200.0000 mg | ORAL_TABLET | Freq: Every day | ORAL | Status: DC
  Administered 2015-10-10 – 2015-10-12 (×3): 200 mg via ORAL
  Filled 2015-10-10 (×4): qty 1

## 2015-10-10 NOTE — Progress Notes (Signed)
Did not attend group 

## 2015-10-10 NOTE — [facility] Counselor (Signed)
Adult Comprehensive Assessment  Patient ID: Stacey Weaver, female   DOB: 09-02-1975, 40 y.o.   MRN: [id number]  Information Source: Information source: Patient  Current Stressors:  Educational / Learning stressors: has AA in Med Assisting Employment / Job issues: works Hospital doctor as CMA at [employer] Family Relationships: conflict w mother, Patent examiner / Lack of resources (include bankruptcy): no issues noted Housing / Lack of housing: lives w mother, currently strained relationship due to presence of mother's boyfriend Physical health (include injuries & life threatening diseases): recent knee surgery for torn meniscus and has asthma Social relationships: supportive cousin and girlfriends Substance abuse: denies Bereavement / Loss: no concerns notes  Living/Environment/Situation:  Living Arrangements: Parent Living conditions (as described by patient or guardian): Lives w mother, moved in after divorce 4 years ago; mother's boyfriend recently moved in which has caused conflict for patient How long has patient lived in current situation?: 4 years What is atmosphere in current home:  (tense, frequent arguments)  Family History:  Marital status: Divorced Divorced, when?: 4 years ago Are you sexually active?: No What is your sexual orientation?: heterosexual Has your sexual activity been affected by drugs, alcohol, medication, or emotional stress?: unknown Does patient have children?: No  Childhood History:  By whom was/is the patient raised?: Mother Additional childhood history information: mother was irritable, frequent arguments w mother and grandmother, no contact w ftaher Description of patient's relationship with caregiver when they were a child: tense, see above Patient's description of current relationship with people who raised him/her: feels she has always had a distant relationship w mother; however arguments have increased since mother's  boyfriend moved into home How were you disciplined when you got in trouble as a child/adolescent?: verbal correction Does patient have siblings?: Yes Number of Siblings: 1 Description of patient's current relationship with siblings: "decent" relationship w 34 yo brother who lives in [place] Did patient suffer any verbal/emotional/physical/sexual abuse as a child?: No Did patient suffer from severe childhood neglect?: No Has patient ever been sexually abused/assaulted/raped as an adolescent or adult?: No Was the patient ever a victim of a crime or a disaster?: No Witnessed domestic violence?: No Has patient been effected by domestic violence as an adult?: No  Education:  Highest grade of school patient has completed: Associates degree Currently a student?: No Learning disability?: No  Employment/Work Situation:   Employment situation: Employed Where is patient currently employed?: [employer] How long has patient been employed?: several years Patient's job has been impacted by current illness: Yes Describe how patient's job has been impacted: will call out when too depressed to work What is the longest time patient has a held a job?: see above Has patient ever been in the [employer]?: No Has patient ever served in combat?: No Did You Receive Any Psychiatric Treatment/Services While in Equities trader?: No Are There Guns or Other Weapons in Your Home?: No  Financial Resources:   Financial resources: Income from employment Does patient have a representative payee or guardian?: No  Alcohol/Substance Abuse:   What has been your use of drugs/alcohol within the last 12 months?: denies all use except for occasional drink of alcohol If attempted suicide, did drugs/alcohol play a role in this?: No Alcohol/Substance Abuse Treatment Hx: Denies past history Has alcohol/substance abuse ever caused legal problems?: No  Social Support System:   Patient's Community Support System:  Good Describe Community Support System: supportive girlfriends who are "trying to understand how  to help", can feel isolated due to mood instability and issues w bipolar disorder Type of faith/religion: none How does patient's faith help to cope with current illness?: na  Leisure/Recreation:   Leisure and Hobbies: eating out, "hanging out w my friends"  Strengths/Needs:   What things does the patient do well?: caring person, sociable when not depressed In what areas does patient struggle / problems for patient: wants stable mood and ability to be happy  Discharge Plan:   Does patient have access to transportation?: Yes Will patient be returning to same living situation after discharge?: Yes Currently receiving community mental health services: Yes (From Whom) (Dr Norma FredricksonBharani from [facility] for meds mgmt) Does patient have financial barriers related to discharge medications?: No  Summary/Recommendations:   Summary and Recommendations (to be completed by the evaluator): Patient is a 40 year old female, admitted voluntarily after expressing suicidal ideation and unable to contract for safety, diagnosed w Bipolar 1 Disorder. Currently employed as Engineer, sitemedical assistant, lives w mother after divorce 4 years ago.  Struggles w mood instability, depression.  Stressor prior to admission was  arguments w family members and recent change in household composition.  Patient receives medications management from [facility] and wants to return at discharge, has been asked by current provider to consider referral for therapy.  Patient reports that she has been inconsistent in taking her prescribed medications for several months, feeling that they "will not do any good."  Also reports that she "cat naps" but has significant difficulty sleeping.  Appetite has decreased, as has ability to feel happiness or pleasure in life. Patient will benefit from hospitalization for crisis stabilization, medication  management, group psychotherapy and psychoeducation.  Anticipated outcomes include increased mood stability, less depressed mood, increased coping skills.  [name], Anne C. 10/10/2015

## 2015-10-10 NOTE — Progress Notes (Signed)
D-  Patient has reported anxiety, agitation, depression, SI with no plan and ability to contract for safety.  Patient reports AH and sometimes seeing shadows.  Patient states that the voices are telling her to shop shop shop and she ends up [facility] of [facility].   Patient is insightful about her illness and symptoms and able to come to staff before reaching a point of distress due to poor symptom management.   Patient has and irritable edge to her and states that she attempts to sleep so that she wont lash out due to her irritability.  Patient has been free of any behavioral dyscontrol this shift.  A- Assess patient for safety, offer medications as prescribed, engage patient in 1:1 staff talks.   R- Patient able to contract for safety, continue to monitor as prescribed.

## 2015-10-10 NOTE — [facility] Group Notes (Signed)
[facility] LCSW Group Therapy 10/10/2015 1:15pm  Type of Therapy: Group Therapy- Feelings Around Relapse and Recovery  Participation Level: Minimal  Participation Quality:  Vague, somewhat disengaged  Affect:  Appropriate  Cognitive: Alert and Oriented   Insight:  Limited  Engagement in Therapy: Developing/Improving and Engaged   Modes of Intervention: Clarification, Confrontation, Discussion, Education, Exploration, Limit-setting, Orientation, Problem-solving, Rapport Building, Dance movement psychotherapisteality Testing, Socialization and Support  Summary of Progress/Problems: The topic for today was feelings about relapse. The group discussed what relapse prevention is to them and identified triggers that they are on the path to relapse. Members also processed their feeling towards relapse and were able to relate to common experiences. Group also discussed coping skills that can be used for relapse prevention.  Pt came to group briefly and was able to identify some warning signs for relapse such as isolation. Then she began to say "Just don't tell people your plans. Next thing I knew she was calling 911."   Therapeutic Modalities:   Cognitive Behavioral Therapy Solution-Focused Therapy Assertiveness Training Relapse Prevention Therapy    Stacey SprinklesLauren Carter, LCSWA [phone] 10/10/2015 3:22 PM

## 2015-10-10 NOTE — Progress Notes (Signed)
Patient ID: Stacey ShutterNaima S Weaver, female   DOB: 08/05/1975, 40 y.o.   MRN: [id number] D: Patient is a new admit sleeping since beginning of shift. Respiration regular and unlabored. No sign of distress noted at this time A: 15 mins checks for safety. R: Patient is safe.

## 2015-10-10 NOTE — H&P (Signed)
Psychiatric Admission Assessment Adult  Patient Identification: Stacey Weaver MRN:  [id number] Date of Evaluation:  10/10/2015 Chief Complaint:  BIPOLAR DISORDER, SEVERE Principal Diagnosis: MDD (major depressive disorder) ([place]) Diagnosis:   Patient Active Problem List   Diagnosis Date Noted  . MDD (major depressive disorder) ([place]) [F32.9] 10/10/2015    Priority: High  . Feeling suicidal [F48.9] 10/09/2015    Priority: High  . Depression [F32.9] 08/07/2011    Priority: High  . Rhabdomyolysis [M62.82] 08/08/2011  . Antihistamines overdose [T45.0X1A] 08/08/2011  . SSRI overdose [T43.221A] 08/07/2011  . Anxiety disorder [F41.9] 08/07/2011  . Obesity [E66.9] 08/07/2011  . Hypothyroidism [E03.9] 08/07/2011   History of Present Illness:   Stacey Weaver, 40 y.o. Female.  She is seen today.  She tearfully states, "it would be better if they just buried me so that I would not have to be a burden to anyone.  She reports that she lives with her mom and they have a tense relationship.  She feels that her mother does not care for her.  She states that she is employed and that her main problem is that her moods are always erratic.   She expresses frustration that her meds are never working right.   She was last here in 2012 for same c/o of strong suicidal ideations due to her relationship with her mother.  She is established mental health care with Dr Para Skeans and is prescribed Lamictal 25 mg daily, Zoloft 50 mg, Navane 3 mg daily.  She states compliance with meds.  Pt reports SI with no plan. Pt states she cannot contract for safety. Pt reports 2 previous SI attempts. Pt denies HI and AVH. Pt reports severe depression. According to the Pt, she is frustrated with life. Pt states she feels the world would be better without her. Pt states she feels she is a burden to the world. Pt reports hopelessness and helplessness. According to the Pt, she resides with mother and they do not have a good relationship.  Pt states "no one care about me." Pt reports 1 previous hospitalization for SI and depression. Pt states she is seen by a psychiatrist. Pt is prescribed Lamictal, Sertruline, and Trazodone. Pt does not feel her medications are effective. Pt denies abuse. Pt denies SA.   Associated Signs/Symptoms: Depression Symptoms:  depressed mood, (Hypo) Manic Symptoms:  Impulsivity, Irritable Mood, Labiality of Mood, Anxiety Symptoms:  Excessive Worry, Psychotic Symptoms:  Hallucinations: Command:  "telling me to shop shop shop" PTSD Symptoms: NA Total Time spent with patient: 30 minutes  Past Psychiatric History: Admitted in 2012 with suicidal ideations.    Is the patient at risk to self? Yes.    Has the patient been a risk to self in the past 6 months? Yes.    Has the patient been a risk to self within the distant past? Yes.    Is the patient a risk to others? No.  Has the patient been a risk to others in the past 6 months? No.  Has the patient been a risk to others within the distant past? No.   Prior Inpatient Therapy:   Prior Outpatient Therapy:    Alcohol Screening: 1. How often do you have a drink containing alcohol?: Never 9. Have you or someone else been injured as a result of your drinking?: No 10. Has a relative or friend or a doctor or another health worker been concerned about your drinking or suggested you cut down?: No Alcohol Use Disorder Identification  Test Final Score (AUDIT): 0 Brief Intervention: AUDIT score less than 7 or less-screening does not suggest unhealthy drinking-brief intervention not indicated Substance Abuse History in the last 12 months:  Yes.   Consequences of Substance Abuse: NA Previous Psychotropic Medications: Abilify 10 mg d/c due to cost.  She was on Klonopin but stopped as it was not as effective for her anxiety.  She was on Wellbutrin XL 150 mg but she overdosed on these during 2012 admission and this has subsequently been stopped.  Psychological  Evaluations: Yes  Past Medical History:  Past Medical History  Diagnosis Date  . Bipolar 1 disorder ([place])   . Asthma   . Migraine   . Anxiety     Past Surgical History  Procedure Laterality Date  . Knee surgery     Family History:  Family History  Problem Relation Age of Onset  . Hyperlipidemia Mother   . Hypertension Mother   . Heart disease Father   . Hyperlipidemia Maternal Grandmother   . Cancer Maternal Grandmother   . Cancer Maternal Grandfather    Family Psychiatric  History: see HPI Tobacco Screening: _0 ([phone])::1)@ Social History:  History  Alcohol Use No     History  Drug Use No    Additional Social History:  Allergies:   Allergies  Allergen Reactions  . Almotriptan Malate Shortness Of Breath  . Cymbalta [Duloxetine Hcl] Other (See Comments)    suicidal thoughts  . Imitrex [Sumatriptan] Shortness Of Breath and Other (See Comments)    Muscle spasms  . Zomig Shortness Of Breath  . Geodon [Ziprasidone Hcl] Other (See Comments)    hallucinations  . Lactose Intolerance (Gi) Diarrhea and Other (See Comments)    Gas and bloated   . Penicillins Other (See Comments)    GI upset  Has patient had a PCN reaction causing immediate rash, facial/tongue/throat swelling, SOB or lightheadedness with hypotension: No Has patient had a PCN reaction causing severe rash involving mucus membranes or skin necrosis: No Has patient had a PCN reaction that required hospitalization No Has patient had a PCN reaction occurring within the last 10 years: Yes If all of the above answers are "NO", then may proceed with Cephalosporin use.   . Zyprexa [Olanzapine] Other (See Comments)    Hallucinations    Lab Results:  Results for orders placed or performed during the hospital encounter of 10/09/15 (from the past 48 hour(s))  Comprehensive metabolic panel     Status: Abnormal   Collection Time: 10/09/15  2:01 PM  Result Value Ref Range   Sodium 136 135 - 145 mmol/L    Potassium 3.5 3.5 - 5.1 mmol/L   Chloride 108 101 - 111 mmol/L   CO2 21 (L) 22 - 32 mmol/L   Glucose, Bld 109 (H) 65 - 99 mg/dL   BUN 10 6 - 20 mg/dL   Creatinine, Ser 0.94 0.44 - 1.00 mg/dL   Calcium 9.3 8.9 - 10.3 mg/dL   Total Protein 8.1 6.5 - 8.1 g/dL   Albumin 4.1 3.5 - 5.0 g/dL   AST 15 15 - 41 U/L   ALT 9 (L) 14 - 54 U/L   Alkaline Phosphatase 94 38 - 126 U/L   Total Bilirubin 0.8 0.3 - 1.2 mg/dL   GFR calc non Af Amer >60 >60 mL/min   GFR calc Af Amer >60 >60 mL/min    Comment: (NOTE) The eGFR has been calculated using the CKD EPI equation. This calculation has not been validated in  all clinical situations. eGFR's persistently <60 mL/min signify possible Chronic Kidney Disease.    Anion gap 7 5 - 15  cbc     Status: None   Collection Time: 10/09/15  2:01 PM  Result Value Ref Range   WBC 7.3 4.0 - 10.5 K/uL   RBC 4.45 3.87 - 5.11 MIL/uL   Hemoglobin 12.2 12.0 - 15.0 g/dL   HCT 38.2 36.0 - 46.0 %   MCV 85.8 78.0 - 100.0 fL   MCH 27.4 26.0 - 34.0 pg   MCHC 31.9 30.0 - 36.0 g/dL   RDW 13.7 11.5 - 15.5 %   Platelets 315 150 - 400 K/uL  Ethanol     Status: None   Collection Time: 10/09/15  2:04 PM  Result Value Ref Range   Alcohol, Ethyl (B) <5 <5 mg/dL    Comment:        LOWEST DETECTABLE LIMIT FOR SERUM ALCOHOL IS 5 mg/dL FOR MEDICAL PURPOSES ONLY   Salicylate level     Status: None   Collection Time: 10/09/15  2:04 PM  Result Value Ref Range   Salicylate Lvl <3.2 2.8 - 30.0 mg/dL  Acetaminophen level     Status: Abnormal   Collection Time: 10/09/15  2:04 PM  Result Value Ref Range   Acetaminophen (Tylenol), Serum <10 (L) 10 - 30 ug/mL    Comment:        THERAPEUTIC CONCENTRATIONS VARY SIGNIFICANTLY. A RANGE OF 10-30 ug/mL MAY BE AN EFFECTIVE CONCENTRATION FOR MANY PATIENTS. HOWEVER, SOME ARE BEST TREATED AT CONCENTRATIONS OUTSIDE THIS RANGE. ACETAMINOPHEN CONCENTRATIONS >150 ug/mL AT 4 HOURS AFTER INGESTION AND >50 ug/mL AT 12 HOURS AFTER INGESTION  ARE OFTEN ASSOCIATED WITH TOXIC REACTIONS.     Blood Alcohol level:  Lab Results  Component Value Date   ETH <5 10/09/2015   ETH <11 02/33/4356    Metabolic Disorder Labs:  No results found for: HGBA1C, MPG No results found for: PROLACTIN No results found for: CHOL, TRIG, HDL, CHOLHDL, VLDL, LDLCALC  Current Medications: Current Facility-Administered Medications  Medication Dose Route Frequency Provider Last Rate Last Dose  . acetaminophen (TYLENOL) tablet 650 mg  650 mg Oral Q6H PRN Nanci Pina, FNP      . alum & mag hydroxide-simeth (MAALOX/MYLANTA) 200-200-20 MG/5ML suspension 30 mL  30 mL Oral Q4H PRN Nanci Pina, FNP      . hydrOXYzine (ATARAX/VISTARIL) tablet 25 mg  25 mg Oral Q6H PRN Nanci Pina, FNP   25 mg at 10/10/15 0826  . magnesium hydroxide (MILK OF MAGNESIA) suspension 30 mL  30 mL Oral Daily PRN Nanci Pina, FNP      . traZODone (DESYREL) tablet 100 mg  100 mg Oral QHS Nanci Pina, FNP   100 mg at 10/09/15 2200   PTA Medications: Prescriptions prior to admission  Medication Sig Dispense Refill Last Dose  . acetaminophen (TYLENOL) 500 MG tablet Take 1,000 mg by mouth every 6 (six) hours as needed for moderate pain.   Past Month at Unknown time  . ALPRAZolam (XANAX) 0.25 MG tablet Take 0.25 mg by mouth 2 (two) times daily as needed for anxiety.   10/08/2015 at Unknown time  . etonogestrel (NEXPLANON) 68 MG IMPL implant Inject 1 each into the skin once.   10/09/2015 at Unknown time  . lamoTRIgine (LAMICTAL) 25 MG tablet Take 25 mg by mouth at bedtime. Anti-seizure medication. Consult needed.   10/08/2015 at Unknown time  . methocarbamol (ROBAXIN) 500 MG  tablet Take 1 tablet (500 mg total) by mouth 2 (two) times daily. 20 tablet 0 2 weeks at Unknown time  . sertraline (ZOLOFT) 50 MG tablet Take 50 mg by mouth daily.   10/08/2015 at Unknown time  . thiothixene (NAVANE) 1 MG capsule Take 1 mg by mouth at bedtime.    10/08/2015 at Unknown time  . traZODone  (DESYREL) 100 MG tablet Take 300 mg by mouth at bedtime.    10/08/2015 at Unknown time  . zonisamide (ZONEGRAN) 25 MG capsule Take 25 mg by mouth at bedtime.   10/08/2015 at Unknown time   Musculoskeletal: Strength & Muscle Tone: within normal limits Gait & Station: normal Patient leans: N/A  Psychiatric Specialty Exam: Physical Exam  Nursing note and vitals reviewed. Psychiatric: Her mood appears anxious. She exhibits a depressed mood.    Review of Systems  Psychiatric/Behavioral: Positive for depression. The patient is nervous/anxious.   All other systems reviewed and are negative.   Blood pressure 101/60, pulse 125, temperature 98.8 F (37.1 C), temperature source Oral, resp. rate 18, height 5' 3.98" (1.625 m), weight 94.348 kg (208 lb).Body mass index is 35.73 kg/(m^2).  General Appearance: Neat  Eye Contact:  Poor  Speech:  Normal Rate  Volume:  Normal  Mood:  Anxious, Depressed, Hopeless and Worthless  Affect:  Congruent, Depressed, Labile and Tearful  Thought Process:  Coherent  Orientation:  Full (Time, Place, and Person)  Thought Content:  Rumination  Suicidal Thoughts:  No  Homicidal Thoughts:  No  Memory:  Immediate;   Fair Recent;   Fair Remote;   Good  Judgement:  Good  Insight:  Good  Psychomotor Activity:  Normal  Concentration:  Concentration: Good and Attention Span: Good  Recall:  Good  Fund of Knowledge:  Good  Language:  Good  Akathisia:  Yes  Handed:  Right  AIMS (if indicated):     Assets:  Desire for Improvement Social Support Talents/Skills  ADL's:  Intact  Cognition:  WNL  Sleep:  Number of Hours: 6.75   Treatment Plan Summary: Admit for crisis management and mood stabilization. Medication management to re-stabilize current mood symptoms.  Lamictal 25 mg BID mood sx, Sertraline 50 mg daily depression, Tegretol  200 mg daily mood stabilization, Ativan 0.5 mg PRN anxiety Group counseling sessions for coping skills Medical consults as  needed Review and reinstate any pertinent home medications for other health problems  Observation Level/Precautions:  15 minute checks  Laboratory:  per ED  Psychotherapy:  group  Medications:  As per medlist  Consultations:  As needed  Discharge Concerns:  safety  Estimated LOS:  2-7 days  Other:     I certify that inpatient services furnished can reasonably be expected to improve the patient's condition.    Janett Labella, NP [facility] 7/14/201710:20 AM I have discussed case with NP and have met with patient  Agree with NP note and assessment  40 year old single female, no children, lives with mother, employed, describes relationship with mother as a major stressor , worsened recently after mother's boyfriend moved in with them.  Patient reports she has a history of " my moods being all over the place, sometimes up, sometimes down". States episodes are relatively short lived, less than a day to " a couple of days". States her mood symptoms have worsened recently .  Presented to the ED due to worsening mood, depression and some suicidal ideations .   States she has been diagnosed with  BIpolar Disorder, has had several psychiatric admissions, most recently 2012. Has been taking on Lamictal ( has been on it for years), Navane ( has been on it for 1.5 years ), Xanax ( 0.25 mgrs BID ), Trazodone , Zoloft ( started 2 months ago). States she had been sub-optimally Compliant with her psychiatric medications , taking them only a couple of times per week. She follows up with Dr. Leandrew Koyanagi for psychiatric med management . Has a prior history of suicide attempts by overdosing .  Denies alcohol or drug abuse   Medical history - R knee pain , Does not smoke cigarettes .  Dx- Bipolar Disorder, Depressed  Plan - inpatient admission  We reviewed medication options, she reports having been on multiple psychiatric medications in the past - remembers being on Navane, Abilify, Zyprexa, Geodon,  Seroquel, Depakote ER and does not remember these medications being helpful or developed side effects. At this time agrees to Tegretol trial. Continue low dose Zoloft .

## 2015-10-10 NOTE — Tx Team (Signed)
Interdisciplinary Treatment Plan Update (Adult) Date: 10/10/2015   Date: 10/10/2015 9:24 AM  Progress in Treatment:  Attending groups: Pt is new to milieu, continuing to assess  Participating in groups: Pt is new to milieu, continuing to assess  Taking medication as prescribed: Yes  Tolerating medication: Yes  Family/Significant othe contact made: No, CSW assessing for appropriate contact  Patient understands diagnosis: Continuing to assess Discussing patient identified problems/goals with staff: Yes  Medical problems stabilized or resolved: Yes  Denies suicidal/homicidal ideation: No recently admitted with passive SI Patient has not harmed self or Others: Yes   New problem(s) identified: None identified at this time.   Discharge Plan or Barriers: Pt will return home and follow-up with outpatient services. Needs therapy referral  Additional comments:  Patient and CSW reviewed pt's identified goals and treatment plan. Patient verbalized understanding and agreed to treatment plan.   Reason for Continuation of Hospitalization:  Anxiety Depression Medication stabilization Suicidal ideation  Estimated length of stay: 3-5 days  Review of initial/current patient goals per problem list:   1.  Goal(s): Patient will participate in aftercare plan  Met:  Yes  Target date: 3-5 days from date of admission   As evidenced by: Patient will participate within aftercare plan AEB aftercare provider and housing plan at discharge being identified.   10/10/15: Pt will return home and follow-up with outpatient services. Needs therapy referral  2.  Goal (s): Patient will exhibit decreased depressive symptoms and suicidal ideations.  Met:  No  Target date: 3-5 days from date of admission   As evidenced by: Patient will utilize self rating of depression at 3 or below and demonstrate decreased signs of depression or be deemed stable for discharge by MD. 10/10/15: Pt was admitted with symptoms of  depression, rating 10/10. Pt continues to present with flat affect and depressive symptoms.  Pt will demonstrate decreased symptoms of depression and rate depression at 3/10 or lower prior to discharge.  3.  Goal(s): Patient will demonstrate decreased signs and symptoms of anxiety.  Met:  No  Target date: 3-5 days from date of admission   As evidenced by: Patient will utilize self rating of anxiety at 3 or below and demonstrated decreased signs of anxiety, or be deemed stable for discharge by MD 10/10/15: Pt was admitted with increased levels of anxiety and is currently rating those symptoms highly. Pt will demonstrated decreased symptoms of anxiety and rate it at 3/10 prior to d/c.  5.  Goal(s): Patient will demonstrate decreased signs of psychosis  . Met:  Progressing . Target date: 3-5 days from date of admission  . As evidenced by: Patient will demonstrate decreased frequency of AVH or return to baseline function    10/10/15: Pt reports hx of AVH, presents with    vague speech.   Attendees:  Patient:    Family:    Physician: Dr. Parke Poisson, MD  10/10/2015 9:24 AM  Nursing: Idell Pickles, RN 10/10/2015 9:24 AM  Clinical Social Worker Peri Maris, [place] 10/10/2015 9:24 AM  Other: Tilden Fossa, LCSWA 10/10/2015 9:24 AM  Clinical: Lars Pinks, RN Case manager  10/10/2015 9:24 AM  Other:  10/10/2015 9:24 AM  Other:     Peri Maris, [place] Social Work [phone]

## 2015-10-10 NOTE — [facility] Suicide Risk Assessment (Signed)
[facility] Admission Suicide Risk Assessment   Nursing information obtained from:  Patient Demographic factors:  NA Current Mental Status:  Suicidal ideation indicated by patient, Self-harm thoughts Loss Factors:  Loss of significant relationship Historical Factors:  Prior suicide attempts, Family history of mental illness or substance abuse Risk Reduction Factors:  Employed, Living with another person, especially a relative, Positive social support  Total Time spent with patient: 45 minutes Principal Problem: MDD (major depressive disorder) ([facility])- consider Bipolar Disorder , Depressed  Diagnosis:   Patient Active Problem List   Diagnosis Date Noted  . MDD (major depressive disorder) ([facility]) [F32.9] 10/10/2015  . Feeling suicidal [F48.9] 10/09/2015  . Rhabdomyolysis [M62.82] 08/08/2011  . Antihistamines overdose [T45.0X1A] 08/08/2011  . SSRI overdose [T43.221A] 08/07/2011  . Depression [F32.9] 08/07/2011  . Anxiety disorder [F41.9] 08/07/2011  . Obesity [E66.9] 08/07/2011  . Hypothyroidism [E03.9] 08/07/2011     Continued Clinical Symptoms:  Alcohol Use Disorder Identification Test Final Score (AUDIT): 0 The "Alcohol Use Disorders Identification Test", Guidelines for Use in Primary Care, Second Edition.  World Science writer [facility]). Score between 0-7:  no or low risk or alcohol related problems. Score between 8-15:  moderate risk of alcohol related problems. Score between 16-19:  high risk of alcohol related problems. Score 20 or above:  warrants further diagnostic evaluation for alcohol dependence and treatment.   CLINICAL FACTORS:  40 year old single female, no children,  lives with mother, employed, describes relationship with mother as a major stressor , worsened recently after mother's boyfriend moved in with them.  Patient reports she has a history of " my moods being all over the place, sometimes up, sometimes down". States episodes are relatively short lived, less than a day  to " a couple of days". States her mood symptoms have worsened recently .  Presented to the ED due to worsening mood, depression and some suicidal ideations .   States she has been diagnosed with BIpolar Disorder, has had several psychiatric admissions, most recently 2012. Has been taking on Lamictal ( has been on it for years), Navane ( has been on it for 1.5 years ), Xanax ( 0.25 mgrs BID ), Trazodone , Zoloft ( started 2 months ago). States she had been sub-optimally  Compliant with her psychiatric medications , taking them only a couple of times per week. She follows up with Dr. Sharee Pimple for psychiatric med management .  Has a prior history of suicide attempts by overdosing .  Denies alcohol or drug abuse   Medical history -  R knee pain , Does not smoke cigarettes .  Dx-  Bipolar Disorder, Depressed  Plan - inpatient admission  We reviewed medication options, she reports having been on multiple psychiatric medications in the past - remembers being on Navane, Abilify,  Zyprexa, Geodon, Seroquel, Depakote ER and does not remember these medications being helpful or developed side effects. At this time agrees to Tegretol trial. Continue low dose Zoloft .    Musculoskeletal: Strength & Muscle Tone: within normal limits Gait & Station: normal Patient leans: N/A  Psychiatric Specialty Exam: Physical Exam  ROS frequent headaches, no chest pain , no shortness of breath, denies any vomiting, no rash   Blood pressure 101/60, pulse 125, temperature 98.8 F (37.1 C), temperature source Oral, resp. rate 18, height 5' 3.98" (1.625 m), weight 208 lb (94.348 kg).Body mass index is 35.73 kg/(m^2).  General Appearance: Fairly Groomed  Eye Contact:  Good  Speech:  Normal Rate  Volume:  Normal  Mood:  depressed , constricted and vaguely dysphoric affect   Affect:  Constricted  Thought Process:  Linear  Orientation:  Full (Time, Place, and Person)  Thought Content:  denies hallucinations, no  delusions, not internally preoccupied   Suicidal Thoughts:  Passive suicidal ideations but denies any plan or intention of suicide or of hurting self and contracts for safety on unit   Homicidal Thoughts:  Denies any violent or homicidal ideations, also specifically denies any thoughts of violence towards mother or mother's boyfriend   Memory:  recent and remote grossly intact   Judgement:  Fair  Insight:  Fair  Psychomotor Activity:  Normal  Concentration:  Concentration: Good and Attention Span: Good  Recall:  Good  Fund of Knowledge:  Good  Language:  Good  Akathisia:  Negative  Handed:  Right  AIMS (if indicated):     Assets:  Communication Skills Desire for Improvement Resilience  ADL's:  Intact  Cognition:  WNL  Sleep:  Number of Hours: 6.75      COGNITIVE FEATURES THAT CONTRIBUTE TO RISK:  Closed-mindedness and Loss of executive function    SUICIDE RISK:   Moderate:  Frequent suicidal ideation with limited intensity, and duration, some specificity in terms of plans, no associated intent, good self-control, limited dysphoria/symptomatology, some risk factors present, and identifiable protective factors, including available and accessible social support.  PLAN OF CARE: Patient will be admitted to inpatient psychiatric unit for stabilization and safety. Will provide and encourage milieu participation. Provide medication management and maked adjustments as needed.  Will follow daily.    I certify that inpatient services furnished can reasonably be expected to improve the patient's condition.   Nehemiah MassedOBOS, Tatem Holsonback, MD 10/10/2015, 1:16 PM

## 2015-10-11 ENCOUNTER — Encounter (HOSPITAL_COMMUNITY): Payer: Self-pay | Admitting: Registered Nurse

## 2015-10-11 DIAGNOSIS — F3130 Bipolar disorder, current episode depressed, mild or moderate severity, unspecified: Secondary | ICD-10-CM

## 2015-10-11 DIAGNOSIS — F489 Nonpsychotic mental disorder, unspecified: Secondary | ICD-10-CM

## 2015-10-11 DIAGNOSIS — G4700 Insomnia, unspecified: Secondary | ICD-10-CM | POA: Insufficient documentation

## 2015-10-11 MED ORDER — ENSURE ENLIVE PO LIQD
237.0000 mL | Freq: Three times a day (TID) | ORAL | Status: DC
  Administered 2015-10-11: 237 mL via ORAL

## 2015-10-11 MED ORDER — IBUPROFEN 600 MG PO TABS
ORAL_TABLET | ORAL | Status: AC
  Filled 2015-10-11: qty 1

## 2015-10-11 MED ORDER — IBUPROFEN 600 MG PO TABS
600.0000 mg | ORAL_TABLET | Freq: Four times a day (QID) | ORAL | Status: DC | PRN
  Administered 2015-10-11: 600 mg via ORAL
  Filled 2015-10-11: qty 1

## 2015-10-11 MED ORDER — IBUPROFEN 600 MG PO TABS: 600.0000 mg | ORAL_TABLET | ORAL | Status: DC | PRN

## 2015-10-11 MED ORDER — HYDROXYZINE HCL 10 MG PO TABS
10.0000 mg | ORAL_TABLET | ORAL | Status: DC | PRN
Start: 2015-10-11 — End: 2015-10-14
  Administered 2015-10-11 – 2015-10-13 (×6): 10 mg via ORAL
  Filled 2015-10-11 (×6): qty 1

## 2015-10-11 MED ORDER — MELOXICAM 15 MG PO TABS
15.0000 mg | ORAL_TABLET | Freq: Two times a day (BID) | ORAL | Status: DC
  Administered 2015-10-11 – 2015-10-12 (×2): 15 mg via ORAL
  Filled 2015-10-11 (×4): qty 1
  Filled 2015-10-11: qty 2

## 2015-10-11 NOTE — Progress Notes (Signed)
Adult Psychoeducational Group Note  Date:  10/11/2015 Time:  8:53 PM  Group Topic/Focus:  Wrap-Up Group:   The focus of this group is to help patients review their daily goal of treatment and discuss progress on daily workbooks.  Participation Level:  Active  Participation Quality:  Appropriate  Affect:  Appropriate  Cognitive:  Appropriate  Insight: Appropriate  Engagement in Group:  Engaged  Modes of Intervention:  Discussion  Additional Comments:  The patient attended group.The patient also said that she was glad her family came to visit.  Octavio Mannshigpen, Brodan Grewell Lee 10/11/2015, 8:53 PM

## 2015-10-11 NOTE — Progress Notes (Signed)
Nursing Progress Note: 7-7p  D- Mood is depressed and anxious,rates anxiety at 8/10. Affect is irritable and appropriate. Pt is able to contract for safety.C/o difficulty sleeping but state after getting her ear pugs she slept. Continues to report aud hall telling her " Run, you need to leave now".   A - Observed pt minimally interacting in the milieu.Support and encouragement offered, safety maintained with q 15 minutes. " My mind is racing all the time and it won't stop, I think why didn't I take the pills before I called my friend." contracts for safety  R-Contracts for safety and continues to follow treatment plan, working on learning new coping skills to deal with anxiety.

## 2015-10-11 NOTE — Progress Notes (Signed)
[facility] MD Progress Note  10/11/2015 4:16 PM Stacey RODRIGES  MRN:  [id number]   Subjective:  "I'm feeling all right" Patient seen by this provider and chart reviewed 10/11/2015.  On evaluation:  Stacey Weaver reports that she is not sleeping through the night.  "I usually wake about 2-3 am and then it takes me a while to fall back to sleep.  States that she doesn't feel rested when she wakes in the morning.  Patient states that she was started on Ativan yesterday but it is not enough.  "I still feel agitated that little dose that they got me on is not helping"  States that she was also taking Vistaril but it was stopped with Ativan started.  Patient states "I'm not really having suicidal thoughts today.  I'm just thinking that I made a mistake.  I called my friend and then took the pills.  I should have took the pills first.  I'm just thinking about how my plan didn't work".  At this time patient denies homicidal ideation, psychosis, and paranoia.      Principal Problem: MDD (major depressive disorder) (HCC) Diagnosis:   Patient Active Problem List   Diagnosis Date Noted  . MDD (major depressive disorder) (HCC) [F32.9] 10/10/2015  . Bipolar I disorder, most recent episode depressed (HCC) [F31.30]   . Feeling suicidal [F48.9] 10/09/2015  . Rhabdomyolysis [M62.82] 08/08/2011  . Antihistamines overdose [T45.0X1A] 08/08/2011  . SSRI overdose [T43.221A] 08/07/2011  . Depression [F32.9] 08/07/2011  . Anxiety disorder [F41.9] 08/07/2011  . Obesity [E66.9] 08/07/2011  . Hypothyroidism [E03.9] 08/07/2011   Total Time spent with patient: 25 minutes  Past Psychiatric History: Inpatient treatment 2012 for depression and suicidal ideation. Abilify 10 mg d/c due to cost. She was on Klonopin but stopped as it was not as effective for her anxiety. She was on Wellbutrin XL 150 mg but she overdosed on these during 2012 admission and this has subsequently been stopped.   Past Medical History:  Past Medical  History  Diagnosis Date  . Bipolar 1 disorder (HCC)   . Asthma   . Migraine   . Anxiety     Past Surgical History  Procedure Laterality Date  . Knee surgery     Family History:  Family History  Problem Relation Age of Onset  . Hyperlipidemia Mother   . Hypertension Mother   . Heart disease Father   . Hyperlipidemia Maternal Grandmother   . Cancer Maternal Grandmother   . Cancer Maternal Grandfather    Family Psychiatric  History: denies Social History:  History  Alcohol Use No     History  Drug Use No    Social History   Social History  . Marital Status: Single    Spouse Name: N/A  . Number of Children: N/A  . Years of Education: N/A   Social History Main Topics  . Smoking status: Never Smoker   . Smokeless tobacco: None  . Alcohol Use: No  . Drug Use: No  . Sexual Activity: Yes    Birth Control/ Protection: Implant   Other Topics Concern  . None   Social History Narrative   Additional Social History:   Sleep: Fair  Appetite:  Poor  Current Medications: Current Facility-Administered Medications  Medication Dose Route Frequency Provider Last Rate Last Dose  . alum & mag hydroxide-simeth (MAALOX/MYLANTA) 200-200-20 MG/5ML suspension 30 mL  30 mL Oral Q4H PRN Truman Hayward, FNP      . carbamazepine (TEGRETOL)  tablet 200 mg  200 mg Oral Daily Craige Cotta, MD   200 mg at 10/11/15 0851  . feeding supplement (ENSURE ENLIVE) (ENSURE ENLIVE) liquid 237 mL  237 mL Oral TID BM Shuvon B Rankin, NP   237 mL at 10/11/15 1512  . hydrOXYzine (ATARAX/VISTARIL) tablet 10 mg  10 mg Oral Q4H PRN Shuvon B Rankin, NP   10 mg at 10/11/15 1511  . ibuprofen (ADVIL,MOTRIN) 600 MG tablet           . ibuprofen (ADVIL,MOTRIN) tablet 600 mg  600 mg Oral Q6H PRN Shuvon B Rankin, NP   600 mg at 10/11/15 1559  . lamoTRIgine (LAMICTAL) tablet 25 mg  25 mg Oral BID Craige Cotta, MD   25 mg at 10/11/15 0851  . LORazepam (ATIVAN) tablet 0.5 mg  0.5 mg Oral Q6H PRN Craige Cotta, MD   0.5 mg at 10/11/15 0853  . magnesium hydroxide (MILK OF MAGNESIA) suspension 30 mL  30 mL Oral Daily PRN Truman Hayward, FNP      . sertraline (ZOLOFT) tablet 50 mg  50 mg Oral Daily Adonis Brook, NP   50 mg at 10/11/15 0851  . traZODone (DESYREL) tablet 300 mg  300 mg Oral QHS Court Joy, PA-C   300 mg at 10/10/15 2122    Lab Results:  Results for orders placed or performed during the hospital encounter of 10/09/15 (from the past 48 hour(s))  Pregnancy, urine     Status: None   Collection Time: 10/10/15  6:14 PM  Result Value Ref Range   Preg Test, Ur NEGATIVE NEGATIVE    Comment:        THE SENSITIVITY OF THIS METHODOLOGY IS >20 mIU/mL. Performed at [facility]     Blood Alcohol level:  Lab Results  Component Value Date   [facility] <5 10/09/2015   ETH <11 05/10/2013    Metabolic Disorder Labs: No results found for: HGBA1C, MPG No results found for: PROLACTIN No results found for: CHOL, TRIG, HDL, CHOLHDL, VLDL, LDLCALC  Physical Findings: AIMS: Facial and Oral Movements Muscles of Facial Expression: None, normal Lips and Perioral Area: None, normal Jaw: None, normal Tongue: None, normal,Extremity Movements Upper (arms, wrists, hands, fingers): None, normal Lower (legs, knees, ankles, toes): None, normal, Trunk Movements Neck, shoulders, hips: None, normal, Overall Severity Severity of abnormal movements (highest score from questions above): None, normal Incapacitation due to abnormal movements: None, normal Patient's awareness of abnormal movements (rate only patient's report): No Awareness, Dental Status Current problems with teeth and/or dentures?: No Does patient usually wear dentures?: No  CIWA:    COWS:     Musculoskeletal: Strength & Muscle Tone: within normal limits Gait & Station: normal Patient leans: N/A  Psychiatric Specialty Exam: Physical Exam  ROS  Blood pressure 100/64, pulse 90, temperature 97.9 F (36.6 C),  temperature source Oral, resp. rate 18, height 5' 3.98" (1.625 m), weight 94.348 kg (208 lb).Body mass index is 35.73 kg/(m^2).  General Appearance: Casual  Eye Contact:  Good  Speech:  Clear and Coherent and Normal Rate  Volume:  Normal  Mood:  Anxious and Depressed  Affect:  Depressed and Flat  Thought Process:  Goal Directed  Orientation:  Full (Time, Place, and Person)  Thought Content:  Rumination  Suicidal Thoughts:  Denies at this time but continues to have thoughts that she did the wrong thing by calling her friend friend first; which caused the suicide attempt to  fail in her eyes.  Homicidal Thoughts:  No  Memory:  Immediate;   Good Recent;   Good Remote;   Good  Judgement:  Impaired  Insight:  Lacking and Shallow  Psychomotor Activity:  Normal  Concentration:  Concentration: Fair and Attention Span: Fair  Recall:  Good  Fund of Knowledge:  Good  Language:  Good  Akathisia:  No  Handed:  Right  AIMS (if indicated):     Assets:  Communication Skills Desire for Improvement  ADL's:  Intact  Cognition:  WNL  Sleep:  Number of Hours: 6.25     Treatment Plan Summary: Daily contact with patient to assess and evaluate symptoms and progress in treatment and Medication management    Plan: Encourage group millue Continue 15 minute checks for safety Medica ton management: Major Depression/Anxiety:  Zoloft 50 mg daily Mood Stabilization:  Lamictal 25 mg Bid; Tegretol  200 mg daily Insomnia (depression): Trazodone 300 mg Q hs Anxiety: Ativan 0.5 mg Q 6 hrs prn; Added Vistaril 10 mg Qid prn (may titrate up if needed) Social work to continue working with patient on discharge planning and follow up   Rankin, Shuvon, NP 10/11/2015, 4:16 PM Agree with NP Progress Note as above

## 2015-10-11 NOTE — [facility] Group Notes (Signed)
[facility] LCSW Group Therapy Note  10/11/2015 at 11:10 AM  Type of Therapy and Topic:  Group Therapy: Avoiding Self-Sabotaging and Enabling Behaviors  Participation Level:  Active  Participation Quality:  Attentive and Sharing  Affect:  Depressed and Flat  Cognitive:  Oriented  Insight:  Limited  Engagement in Therapy:  Limited   Therapeutic models used: Cognitive Behavioral Therapy,  Person-Centered Therapy and Motivational Interviewing  Modes of Intervention:  Discussion, Exploration, Orientation, Rapport Building, Socialization and Support   Summary of Progress/Problems:  The main focus of today's process group was for the patient to identify ways in which they have in the past perhaps sabotaged their own recovery. Motivational Interviewing was utilized to identify what might be a sign they were getting better and what might motivate them to become more engaged with change. Patient processed that she has tendency to focus on the negative thus becomes easily stuck due to depression. Patient was resistant to considering that her focus on negative is self sabotaging.    Carney Bernatherine C Corbet Hanley, LCSW

## 2015-10-12 DIAGNOSIS — F332 Major depressive disorder, recurrent severe without psychotic features: Secondary | ICD-10-CM | POA: Insufficient documentation

## 2015-10-12 MED ORDER — ACETAMINOPHEN 325 MG PO TABS: 650.0000 mg | ORAL_TABLET | Freq: Four times a day (QID) | ORAL | Status: DC | PRN

## 2015-10-12 MED ORDER — CARBAMAZEPINE 200 MG PO TABS
200.0000 mg | ORAL_TABLET | Freq: Two times a day (BID) | ORAL | Status: DC
  Administered 2015-10-12 – 2015-10-14 (×4): 200 mg via ORAL
  Filled 2015-10-12 (×8): qty 1

## 2015-10-12 MED ORDER — BOOST / RESOURCE BREEZE PO LIQD
1.0000 | Freq: Three times a day (TID) | ORAL | Status: DC
Start: 2015-10-12 — End: 2015-10-13
  Administered 2015-10-12: 1 via ORAL
  Filled 2015-10-12 (×10): qty 1

## 2015-10-12 MED ORDER — MELOXICAM 7.5 MG PO TABS
7.5000 mg | ORAL_TABLET | Freq: Two times a day (BID) | ORAL | Status: DC
  Administered 2015-10-13 – 2015-10-14 (×3): 7.5 mg via ORAL
  Filled 2015-10-12 (×8): qty 1

## 2015-10-12 NOTE — Progress Notes (Signed)
[facility] Group Notes:  (Nursing/MHT/Case Management/Adjunct)  Date:  10/12/2015  Time:  8:37 PM  Type of Therapy:  Psychoeducational Skills  Participation Level:  Active  Participation Quality:  Appropriate  Affect:  Flat  Cognitive:  Appropriate  Insight:  Appropriate  Engagement in Group:  Developing/Improving  Modes of Intervention:  Education  Summary of Progress/Problems: Patient states that she had a bad morning and that her bad luck continued throughout the day. She explained that her medication is in the process of being changed. The patient also stated that she had a good visit with her mother. As for the theme of the day, her support system will consist of her family and friends.   Stacey Weaver, [name] S 10/12/2015, 8:37 PM

## 2015-10-12 NOTE — Progress Notes (Signed)
Nursing Note 10/12/2015 1900 - 10/13/15 0730  Data Patient being monitored inpatient for SI with no plan on admission with inability to contract for safety per admission tele-assessment note. Continues to c/o right knee pain, changed this AM from 15 mg to 7.5mg  BID.  Patient will start tomorrow "It was helping but I feel like it's wearing off."  Patient due for next dose in AM.  Has no other pain medicine ordered.  Reports passive SI but agrees to come to staff if she is feeling unsafe, like she will harm herself.  Denies HI.  Reported auditory hallucinations earlier today but none now.  Denied visual hallucinations.  States she hasn't been eating. Refused HS boost breeze.  Became irritable around HS medication time, requested PRN Ativan and Vistaril for anxiety.  Action Spoke with patient 1:1, offered to be a support for duration of shift..  Remained on 15 minute checks for safety.  Given PRN Vistaril and Ativan per patient request.  Contacted on-call provider, received order for PRN Tylenol (patient asleep before nurse could offer).  Response Patient remains safe on unit.  Laying in bed with eyes closed, even non-labored chest rises shortly after HS/PRN medicines.

## 2015-10-12 NOTE — Progress Notes (Addendum)
Nursing Note 10/11/2015 1900 - 10/12/2015 0730  Data Patient being monitored inpatient for SI with no plan on admission with inability to contract for safety per admission tele-assessment note.  Affect blunted but appropriate, mood "anxious."  C/O knee pain "I had surgery a month ago," stated she took meloxicam 15mg  BID at home which was helping, reports motrin given previous shift ineffective.  When asked how she was doing patient stated "Sometimes I wish I didn't call" "I wish I was successful."  Passive SI with no plan "earlier."  When asked if she could come to this nurse before acting on thoughts to harm self she said "what if I can't find you, what if you aren't around," nurse said he would be available until 7:30AM, and that if this nurse wasn't in the hallway, one of the techs or the other nurse could come and get him, or she could speak with one of them about it.   Patient agreed to come to staff after this conversation.  Patient reports she hasn't been eating anything for past 3 days.  Refused 8PM ensure.  Denied HI and AVH.  Action Spoke with patient 1:1 for about 5 minutes, offered to be a support for duration of the shift.  Spoke with Laz[place], [place] re: meloxicam, order received, first dose given to patient with chocolate pudding.  Would only eat chocolate pudding after encouragement as meloxicam needs to be taken with food.  Ibuprofen discontinued per order.  Sticky note left in chart re: intake.  Remained on 15 minute checks for safety.  Response Remains safe on unit.  Meloxicam effective evidenced by patient observed resting quietly in bed with eyes closed, even non-labored chest rises.

## 2015-10-12 NOTE — Progress Notes (Signed)
[facility] MD Progress Note     Stacey Weaver is a 40 y.o. female     MRN : [id number]     DOB: 01/26/1976   Stacey Weaver  MRN:  [id number]   Subjective:  "I'm not feeling so good; I'm just anxious" Patient seen by this provider and chart reviewed 10/12/2015.  On evaluation:  Stacey Weaver reports that she is feeling anxious today related to thinking about life's events and situations.  States that the Ativan and Vistaril has helped "a little"; but is still feeling agitated, irritable, and anxious "mood all over the place."  Reports that she is tolerating medication with out adverse.  States that she did not sleep well; and has not ate anything since she has been in the hospital.  "I go to the cafeteria and try to eat; but when I bring it to my mouth it makes me nausea."  Also states that she can't drink the Ensure ordered yesterday related to it giving her diarrhea. States that she is lactose intolerant.    Discussed increasing Lamictal or Tegretol to help with mood stabilization and Leaving the Ativan and Vistaril as current; patient in agreement.    Principal Problem: Bipolar I disorder, most recent episode depressed (HCC) Diagnosis:   Patient Active Problem List   Diagnosis Date Noted  . Insomnia [G47.00]   . MDD (major depressive disorder) (HCC) [F32.9] 10/10/2015  . Bipolar I disorder, most recent episode depressed (HCC) [F31.30]   . Feeling suicidal [F48.9] 10/09/2015  . Rhabdomyolysis [M62.82] 08/08/2011  . Antihistamines overdose [T45.0X1A] 08/08/2011  . SSRI overdose [T43.221A] 08/07/2011  . Depression [F32.9] 08/07/2011  . Anxiety disorder [F41.9] 08/07/2011  . Obesity [E66.9] 08/07/2011  . Hypothyroidism [E03.9] 08/07/2011   Total Time spent with patient: 15 minutes  Past Psychiatric History: Inpatient treatment 2012 for depression and suicidal ideation. Abilify 10 mg d/c due to cost. She was on Klonopin but stopped as it was not as effective for her anxiety. She was on  Wellbutrin XL 150 mg but she overdosed on these during 2012 admission and this has subsequently been stopped.   Past Medical History:  Past Medical History  Diagnosis Date  . Bipolar 1 disorder (HCC)   . Asthma   . Migraine   . Anxiety     Past Surgical History  Procedure Laterality Date  . Knee surgery     Family History:  Family History  Problem Relation Age of Onset  . Hyperlipidemia Mother   . Hypertension Mother   . Heart disease Father   . Hyperlipidemia Maternal Grandmother   . Cancer Maternal Grandmother   . Cancer Maternal Grandfather    Family Psychiatric  History: denies Social History:  History  Alcohol Use No     History  Drug Use No    Social History   Social History  . Marital Status: Single    Spouse Name: N/A  . Number of Children: N/A  . Years of Education: N/A   Social History Main Topics  . Smoking status: Never Smoker   . Smokeless tobacco: None  . Alcohol Use: No  . Drug Use: No  . Sexual Activity: Yes    Birth Control/ Protection: Implant   Other Topics Concern  . None   Social History Narrative   Additional Social History:   Sleep: Fair  Appetite:  Poor  Current Medications: Current Facility-Administered Medications  Medication Dose Route Frequency Provider Last Rate Last Dose  . alum &  mag hydroxide-simeth (MAALOX/MYLANTA) 200-200-20 MG/5ML suspension 30 mL  30 mL Oral Q4H PRN Truman Hayward, FNP      . carbamazepine (TEGRETOL) tablet 200 mg  200 mg Oral BID Shuvon B Rankin, NP      . feeding supplement (BOOST / RESOURCE BREEZE) liquid 1 Container  1 Container Oral TID BM Shuvon B Rankin, NP   1 Container at 10/12/15 1104  . hydrOXYzine (ATARAX/VISTARIL) tablet 10 mg  10 mg Oral Q4H PRN Shuvon B Rankin, NP   10 mg at 10/12/15 0908  . lamoTRIgine (LAMICTAL) tablet 25 mg  25 mg Oral BID Craige Cotta, MD   25 mg at 10/12/15 1610  . LORazepam (ATIVAN) tablet 0.5 mg  0.5 mg Oral Q6H PRN Craige Cotta, MD   0.5 mg at  10/12/15 0844  . magnesium hydroxide (MILK OF MAGNESIA) suspension 30 mL  30 mL Oral Daily PRN Truman Hayward, FNP      . meloxicam (MOBIC) tablet 7.5 mg  7.5 mg Oral BID Shuvon B Rankin, NP      . sertraline (ZOLOFT) tablet 50 mg  50 mg Oral Daily Adonis Brook, NP   50 mg at 10/12/15 0837  . traZODone (DESYREL) tablet 300 mg  300 mg Oral QHS Court Joy, PA-C   300 mg at 10/11/15 2113    Lab Results:  Results for orders placed or performed during the hospital encounter of 10/09/15 (from the past 48 hour(s))  Pregnancy, urine     Status: None   Collection Time: 10/10/15  6:14 PM  Result Value Ref Range   Preg Test, Ur NEGATIVE NEGATIVE    Comment:        THE SENSITIVITY OF THIS METHODOLOGY IS >20 mIU/mL. Performed at [facility]     Blood Alcohol level:  Lab Results  Component Value Date   [facility] <5 10/09/2015   ETH <11 05/10/2013    Metabolic Disorder Labs: No results found for: HGBA1C, MPG No results found for: PROLACTIN No results found for: CHOL, TRIG, HDL, CHOLHDL, VLDL, LDLCALC  Physical Findings: AIMS: Facial and Oral Movements Muscles of Facial Expression: None, normal Lips and Perioral Area: None, normal Jaw: None, normal Tongue: None, normal,Extremity Movements Upper (arms, wrists, hands, fingers): None, normal Lower (legs, knees, ankles, toes): None, normal, Trunk Movements Neck, shoulders, hips: None, normal, Overall Severity Severity of abnormal movements (highest score from questions above): None, normal Incapacitation due to abnormal movements: None, normal Patient's awareness of abnormal movements (rate only patient's report): No Awareness, Dental Status Current problems with teeth and/or dentures?: No Does patient usually wear dentures?: No  CIWA:    COWS:     Musculoskeletal: Strength & Muscle Tone: within normal limits Gait & Station: normal Patient leans: N/A  Psychiatric Specialty Exam: Physical Exam  Constitutional:  She is oriented to person, place, and time.  Neck: Normal range of motion.  Respiratory: Effort normal.  Musculoskeletal: Normal range of motion.  Neurological: She is alert and oriented to person, place, and time.    ROS  Blood pressure 98/76, pulse 130, temperature 98.9 F (37.2 C), temperature source Oral, resp. rate 16, height 5' 3.98" (1.625 m), weight 94.348 kg (208 lb).Body mass index is 35.73 kg/(m^2).  General Appearance: Casual  Eye Contact:  Good  Speech:  Clear and Coherent and Normal Rate  Volume:  Normal  Mood:  Anxious and Depressed  Affect:  Depressed and Flat  Thought Process:  Goal Directed  Orientation:  Full (Time, Place, and Person)  Thought Content:  Rumination  Suicidal Thoughts:  Denies at this time but continues to have thoughts that she did the wrong thing by calling her friend friend first; which caused the suicide attempt to fail in her eyes.  Homicidal Thoughts:  No  Memory:  Immediate;   Good Recent;   Good Remote;   Good  Judgement:  Impaired  Insight:  Lacking and Shallow  Psychomotor Activity:  Normal  Concentration:  Concentration: Fair and Attention Span: Fair  Recall:  Good  Fund of Knowledge:  Good  Language:  Good  Akathisia:  No  Handed:  Right  AIMS (if indicated):     Assets:  Communication Skills Desire for Improvement  ADL's:  Intact  Cognition:  WNL  Sleep:  Number of Hours: 6.75   Assessment: Some improvement with anxiety with taking Ativan and Vistaril. No improvement in Mood (irritability, mood swings).  Will Discontinue Ensure related to lactose intolerant and start Resource for nutritional supplement and also start a food diary to assess what the patient is eating.  Increase Tegretol to Bid to assess with mood stabilization  Treatment Plan Summary: Daily contact with patient to assess and evaluate symptoms and progress in treatment and Medication management    Plan: Encourage group milieu Continue 15 minute checks for  safety Medica ton management: Major Depression/Anxiety:  Zoloft 50 mg daily Mood Stabilization:  Lamictal 25 mg Bid; Increased Tegretol  200 mg Bid Insomnia (depression): Trazodone 300 mg Q hs Anxiety: Ativan 0.5 mg Q 6 hrs prn; Added Vistaril 10 mg Qid prn (may titrate up if needed) Decrease Appetite:  Discontinue Ensure and Start Resource Tid for nutritional support and Started food diary. Labs ordered to have Tegretol level checked 10/15/15 morning Social work to continue working with patient on discharge planning and follow up   Rankin, Shuvon, NP 10/12/2015, 11:22 AM Agree with NP Progress Note as above

## 2015-10-12 NOTE — Progress Notes (Signed)
DAR: Pt has been in the bed most of the time, pt complained of increased anxiety and had some episode of crying spells. Pt also complained headache and pain in the right leg. Pt given all her medications and scheduled. As per self inventory, pt had a fair night sleep, poor appetite, low energy, and poor concentration. Pt rate depression at 8, hopeless ness at 10, and anxiety at 10. Pt's safety ensured with 15 minute and environmental checks. Pt currently denies SI/HI and A/V hallucinations. Pt verbally agrees to seek staff if SI/HI or A/VH occurs and to consult with staff before acting on these thoughts. Will continue POC.

## 2015-10-12 NOTE — [facility] Group Notes (Signed)
[facility] LCSW Group Therapy  10/12/2015  11:15 AM to Noon  Type of Therapy:  Group Therapy  Participation Level:  Did Not Attend; although encouraged by MHT and CSW to do so   Carney Bernatherine C Krisandra Bueno, LCSW

## 2015-10-13 MED ORDER — TRAZODONE HCL 150 MG PO TABS
150.0000 mg | ORAL_TABLET | Freq: Every evening | ORAL | Status: DC | PRN
  Administered 2015-10-13 (×2): 150 mg via ORAL
  Filled 2015-10-13 (×5): qty 1

## 2015-10-13 MED ORDER — MIRTAZAPINE 7.5 MG PO TABS
7.5000 mg | ORAL_TABLET | Freq: Every day | ORAL | Status: DC
  Filled 2015-10-13 (×2): qty 1

## 2015-10-13 MED ORDER — TRAZODONE HCL 50 MG PO TABS
50.0000 mg | ORAL_TABLET | Freq: Every evening | ORAL | Status: DC | PRN
  Filled 2015-10-13 (×2): qty 1

## 2015-10-13 NOTE — [facility] Group Notes (Signed)
[facility] LCSW Group Therapy  10/13/2015 1:15pm  Type of Therapy:  Group Therapy vercoming Obstacles  Participation Level:  Active  Participation Quality:  Appropriate   Affect:  Appropriate  Cognitive:  Appropriate and Oriented  Insight:  Developing/Improving and Improving  Engagement in Therapy:  Improving  Modes of Intervention:  Discussion, Exploration, Problem-solving and Support  Description of Group:   In this group patients will be encouraged to explore what they see as obstacles to their own wellness and recovery. They will be guided to discuss their thoughts, feelings, and behaviors related to these obstacles. The group will process together ways to cope with barriers, with attention given to specific choices patients can make. Each patient will be challenged to identify changes they are motivated to make in order to overcome their obstacles. This group will be process-oriented, with patients participating in exploration of their own experiences as well as giving and receiving support and challenge from other group members.  Summary of Patient Progress: Pt was initially irritable in group, expressing frustration regarding her nurse and the administration of her medication. Pt is focused on her anxiety medication, describing a need for more. Pt also identified agitation and lack of sleep as obstacles to her wellness. She also discussed her tendency to isolate at levels that are detrimental to her well-being.   Therapeutic Modalities:   Cognitive Behavioral Therapy Solution Focused Therapy Motivational Interviewing Relapse Prevention Therapy   Chad CordialLauren Carter, LCSWA 10/13/2015 6:47 PM

## 2015-10-13 NOTE — [facility] Group Notes (Signed)
[facility]  10/13/2015 8:45 AM  Participation Quality: Alert, Appropriate and Oriented  Mood/Affect: Flat, Withdrawn  Depression Rating: Pt did not state  Anxiety Rating: Pt did not state  Plan for Discharge/Comments: Pt attended discharge planning group and actively participated in group. CSW discussed suicide prevention education with the group and encouraged them to discuss discharge planning and any relevant barriers. Pt reports that she is not feeling well today. Minimal interaction and was observed to be staring out of the window.  Transportation Means: Pt reports access to transportation  Supports: No supports mentioned at this time  Chad CordialLauren Carter, Theresia MajorsLCSWA 10/13/2015 9:33 AM

## 2015-10-13 NOTE — Progress Notes (Signed)
D: Pt observed to be guarded and flat this AM on initial approach. Pt then started to cry during shift assessment. Stated to Clinical research associatewriter "I did not sleep at all last night, my anxiety is getting high and high everyday, especially in the morning and at night, they keep changing my damn medicines". Pt denied SI, HI, AVH and pain at the time. Pt proceeded to be very demanding and argumentative in her request for Ativan on multiple occassions. Stated to Clinical research associatewriter "I don't know why you're telling me to wait an hour after the Vistaril when other nurses give me both the Vistaril and Ativan together".  A: Scheduled and PRN (Vistaril & Ativan) medications administered as ordered. Verbal educations done on scheduled and PRN medications dosing / administration to increase pt's knowledge. Safety checks continues at Q 15 minutes intervals without incidents.  R: Pt compliant with medications when offered. Pt went to bed right after she was reassessed post Vistaril administrations. Denies adverse drug reactions when assessed. Continues to yell and be demanding for more medications. Refused lunch when offered. Remains safe on unit.

## 2015-10-13 NOTE — Progress Notes (Addendum)
[facility] MD Progress Note   Stacey Weaver is a 40 y.o. female     MRN : [id number]     DOB: Aug 18, 1975   Stacey Weaver  MRN:  [id number]   Subjective: Stacey Weaver reports " My anxiety is up and the Ativan is not helping. It is just a little small pill."  Objective: On evaluation:  Stacey Weaver reports that she is feeling anxious.  Reports that her current medication is not helping her anxiety.  Patient reports the relationship with her mother is improving  Reports that she is tolerating medications well without medication side effects. States that she did not sleep well because of her anxiety and thoughts. Patient reports a fair appetite. States her depression 3/10. Support, encouragement and reassurance was provided.     Principal Problem: Bipolar I disorder, most recent episode depressed (HCC) Diagnosis:   Patient Active Problem List   Diagnosis Date Noted  . Severe episode of recurrent major depressive disorder, without psychotic features (HCC) [F33.2]   . Insomnia [G47.00]   . MDD (major depressive disorder) (HCC) [F32.9] 10/10/2015  . Bipolar I disorder, most recent episode depressed (HCC) [F31.30]   . Feeling suicidal [F48.9] 10/09/2015  . Rhabdomyolysis [M62.82] 08/08/2011  . Antihistamines overdose [T45.0X1A] 08/08/2011  . SSRI overdose [T43.221A] 08/07/2011  . Depression [F32.9] 08/07/2011  . Anxiety disorder [F41.9] 08/07/2011  . Obesity [E66.9] 08/07/2011  . Hypothyroidism [E03.9] 08/07/2011   Total Time spent with patient: 15 minutes  Past Psychiatric History: Inpatient treatment 2012 for depression and suicidal ideation. Abilify 10 mg d/c due to cost. She was on Klonopin but stopped as it was not as effective for her anxiety. She was on Wellbutrin XL 150 mg but she overdosed on these during 2012 admission and this has subsequently been stopped.   Past Medical History:  Past Medical History  Diagnosis Date  . Bipolar 1 disorder (HCC)   . Asthma   . Migraine   . Anxiety     Past Surgical History  Procedure Laterality Date  . Knee surgery     Family History:  Family History  Problem Relation Age of Onset  . Hyperlipidemia Mother   . Hypertension Mother   . Heart disease Father   . Hyperlipidemia Maternal Grandmother   . Cancer Maternal Grandmother   . Cancer Maternal Grandfather    Family Psychiatric  History: denies Social History:  History  Alcohol Use No     History  Drug Use No    Social History   Social History  . Marital Status: Single    Spouse Name: N/A  . Number of Children: N/A  . Years of Education: N/A   Social History Main Topics  . Smoking status: Never Smoker   . Smokeless tobacco: None  . Alcohol Use: No  . Drug Use: No  . Sexual Activity: Yes    Birth Control/ Protection: Implant   Other Topics Concern  . None   Social History Narrative   Additional Social History:   Sleep: Fair  Appetite:  Poor  Current Medications: Current Facility-Administered Medications  Medication Dose Route Frequency Provider Last Rate Last Dose  . acetaminophen (TYLENOL) tablet 650 mg  650 mg Oral Q6H PRN Truman Hayward, FNP      . alum & mag hydroxide-simeth (MAALOX/MYLANTA) 200-200-20 MG/5ML suspension 30 mL  30 mL Oral Q4H PRN Truman Hayward, FNP      . carbamazepine (TEGRETOL) tablet 200 mg  200 mg Oral BID  Shuvon B Rankin, NP   200 mg at 10/13/15 0849  . feeding supplement (BOOST / RESOURCE BREEZE) liquid 1 Container  1 Container Oral TID BM Shuvon B Rankin, NP   1 Container at 10/12/15 1104  . hydrOXYzine (ATARAX/VISTARIL) tablet 10 mg  10 mg Oral Q4H PRN Shuvon B Rankin, NP   10 mg at 10/13/15 0854  . lamoTRIgine (LAMICTAL) tablet 25 mg  25 mg Oral BID Craige Cotta, MD   25 mg at 10/13/15 0849  . LORazepam (ATIVAN) tablet 0.5 mg  0.5 mg Oral Q6H PRN Craige Cotta, MD   0.5 mg at 10/12/15 2126  . magnesium hydroxide (MILK OF MAGNESIA) suspension 30 mL  30 mL Oral Daily PRN Truman Hayward, FNP      . meloxicam [facility])  tablet 7.5 mg  7.5 mg Oral BID Shuvon B Rankin, NP   7.5 mg at 10/13/15 0849  . sertraline (ZOLOFT) tablet 50 mg  50 mg Oral Daily Adonis Brook, NP   50 mg at 10/13/15 0849  . traZODone (DESYREL) tablet 300 mg  300 mg Oral QHS Court Joy, PA-C   300 mg at 10/12/15 2126    Lab Results:  No results found for this or any previous visit (from the past 48 hour(s)).  Blood Alcohol level:  Lab Results  Component Value Date   ETH <5 10/09/2015   ETH <11 05/10/2013    Metabolic Disorder Labs: No results found for: HGBA1C, MPG No results found for: PROLACTIN No results found for: CHOL, TRIG, HDL, CHOLHDL, VLDL, LDLCALC  Physical Findings: AIMS: Facial and Oral Movements Muscles of Facial Expression: None, normal Lips and Perioral Area: None, normal Jaw: None, normal Tongue: None, normal,Extremity Movements Upper (arms, wrists, hands, fingers): None, normal Lower (legs, knees, ankles, toes): None, normal, Trunk Movements Neck, shoulders, hips: None, normal, Overall Severity Severity of abnormal movements (highest score from questions above): None, normal Incapacitation due to abnormal movements: None, normal Patient's awareness of abnormal movements (rate only patient's report): No Awareness, Dental Status Current problems with teeth and/or dentures?: No Does patient usually wear dentures?: No  CIWA:    COWS:     Musculoskeletal: Strength & Muscle Tone: within normal limits Gait & Station: normal Patient leans: N/A  Psychiatric Specialty Exam: Physical Exam  Constitutional: She is oriented to person, place, and time.  Neck: Normal range of motion.  Respiratory: Effort normal.  Musculoskeletal: Normal range of motion.  Neurological: She is alert and oriented to person, place, and time.    Review of Systems  Psychiatric/Behavioral: Positive for depression. Negative for suicidal ideas and hallucinations. The patient is nervous/anxious and has insomnia.   All other systems  reviewed and are negative.   Blood pressure 102/70, pulse 103, temperature 98.4 F (36.9 C), temperature source Oral, resp. rate 16, height 5' 3.98" (1.625 m), weight 94.348 kg (208 lb).Body mass index is 35.73 kg/(m^2).  General Appearance: Casual and Fairly Groomed  Eye Contact:  Good  Speech:  Clear and Coherent and Normal Rate  Volume:  Normal  Mood:  Anxious and Depressed  Affect:  Depressed and Flat  Thought Process:  Goal Directed  Orientation:  Full (Time, Place, and Person)  Thought Content:  Rumination  Suicidal Thoughts:  Denies at this time but continues to have thoughts that she did the wrong thing by calling her friend friend first; which caused the suicide attempt to fail in her eyes.  Homicidal Thoughts:  No  Memory:  Immediate;   Good Recent;   Good Remote;   Good  Judgement:  Impaired  Insight:  Lacking and Shallow  Psychomotor Activity:  Normal  Concentration:  Concentration: Fair and Attention Span: Fair  Recall:  Good  Fund of Knowledge:  Good  Language:  Good  Akathisia:  No  Handed:  Right  AIMS (if indicated):     Assets:  Communication Skills Desire for Improvement  ADL's:  Intact  Cognition:  WNL  Sleep:  Number of Hours: 6.75   Of Note: Patient has hx of rhabdomyolysis.     I agree with current treatment plan on 10/13/2015, Patient seen face-to-face for psychiatric evaluation follow-up, chart reviewed and case discussed with the MD Lucianne MussKumar and Treatment team. Reviewed the information documented and agree with the treatment plan.  Treatment Plan Summary: Daily contact with patient to assess and evaluate symptoms and progress in treatment and Medication management   Plan: Encourage group milieu Continue 15 minute checks for safety Medica ton management: Major Depression/Anxiety: Discontinue Zoloft 50 mg daily Mood Stabilization:  Lamictal 25 mg Bid;  ContiuneTegretol  200 mg BID Insomnia (depression):  Discontinue Trazodone 300 mg Q hs Start  Remeron 7.5mg  PO QHS (condsider titration) Anxiety: Ativan 0.5 mg Q 6 hrs prn; Added Vistaril 10 mg Qid prn (may titrate up if needed) Decrease Appetite:  Discontinue Ensure and Start Resource Tid for nutritional support and Started food diary. Labs ordered to have Tegretol level checked 10/15/15 morning Social work to continue working with patient on discharge planning and follow up   Oneta Rackanika N Lewis, NP 10/13/2015, 11:28 AM

## 2015-10-13 NOTE — Progress Notes (Signed)
Adult Psychoeducational Group Note  Date:  10/13/2015 Time:  9:43 PM  Group Topic/Focus:  Wrap-Up Group:   The focus of this group is to help patients review their daily goal of treatment and discuss progress on daily workbooks.  Participation Level:  Minimal  Participation Quality:  Appropriate  Affect:  Appropriate  Cognitive:  Appropriate  Insight: Appropriate  Engagement in Group:  Engaged  Modes of Intervention:  Socialization and Support  Additional Comments:  Patient attended and participated in group tonight. She reports having a good day. She has been having a problem eating, but did eat some.  She went to her groups   Scot DockFrancis, Shihab States [place] 10/13/2015, 9:43 PM

## 2015-10-14 LAB — CARBAMAZEPINE LEVEL, TOTAL: Carbamazepine Lvl: 8.7 ug/mL (ref 4.0–12.0)

## 2015-10-14 MED ORDER — CARBAMAZEPINE 200 MG PO TABS: 200.0000 mg | ORAL_TABLET | Freq: Two times a day (BID) | ORAL | Status: DC

## 2015-10-14 MED ORDER — LAMOTRIGINE 25 MG PO TABS: 25.0000 mg | ORAL_TABLET | Freq: Two times a day (BID) | ORAL | Status: DC

## 2015-10-14 MED ORDER — TRAZODONE HCL 150 MG PO TABS: 150.0000 mg | ORAL_TABLET | Freq: Every evening | ORAL | Status: DC | PRN

## 2015-10-14 MED ORDER — HYDROXYZINE HCL 10 MG PO TABS
10.0000 mg | ORAL_TABLET | ORAL | Status: DC | PRN
Start: 2015-10-14 — End: 2016-01-29

## 2015-10-14 NOTE — [facility] Group Notes (Signed)
[facility] Group Notes:  (Nursing/MHT/Case Management/Adjunct)  Date:  10/14/2015  Time:  10:14 AM  Type of Therapy:  Nurse Education  Participation Level:  Active  Participation Quality:  Appropriate and Attentive  Affect:  Appropriate  Cognitive:  Alert and Appropriate  Insight:  Appropriate and Good  Engagement in Group:  Engaged  Modes of Intervention:  Discussion and Education  Summary of Progress/Problems: Topic was on recovery. Discussed recovery as a process. Group encouraged to stay focus and to complete the recovery process. Group discussed what recovery means to them and what changes they are here to maintained. Patient was attentive and receptive. Stacey Weaver 10/14/2015, 10:14 AM

## 2015-10-14 NOTE — Tx Team (Signed)
Interdisciplinary Treatment Plan Update (Adult) Date: 10/14/2015   Date: 10/14/2015 9:39 AM  Progress in Treatment:  Attending groups: Yes Participating in groups: Yes  Taking medication as prescribed: Yes  Tolerating medication: Yes  Family/Significant othe contact made: Yes with mother Stacey Weaver understands diagnosis: Continuing to assess Discussing Stacey Weaver identified problems/goals with staff: Yes  Medical problems stabilized or resolved: Yes  Denies suicidal/homicidal ideation: Yes  Stacey Weaver has not harmed self or Others: Yes   New problem(s) identified: None identified at this time.   Discharge Plan or Barriers: Pt will return home and follow-up with outpatient services  Additional comments:  Stacey Weaver and CSW reviewed pt's identified goals and treatment plan. Stacey Weaver verbalized understanding and agreed to treatment plan.   Reason for Continuation of Hospitalization:  Anxiety Depression Medication stabilization Suicidal ideation  Estimated length of stay: 0 days  Review of initial/current Stacey Weaver goals per problem list:   1.  Goal(s): Stacey Weaver will participate in aftercare plan  Met:  Yes  Target date: 3-5 days from date of admission   As evidenced by: Stacey Weaver will participate within aftercare plan AEB aftercare provider and housing plan at discharge being identified.   10/10/15: Pt will return home and follow-up with outpatient services.   2.  Goal (s): Stacey Weaver will exhibit decreased depressive symptoms and suicidal ideations.  Met:  Adequate for DC  Target date: 3-5 days from date of admission   As evidenced by: Stacey Weaver will utilize self rating of depression at 3 or below and demonstrate decreased signs of depression or be deemed stable for discharge by MD. 10/10/15: Pt was admitted with symptoms of depression, rating 10/10. Pt continues to present with flat affect and depressive symptoms.  Pt will demonstrate decreased symptoms of depression and rate depression at 3/10  or lower prior to discharge. 10/14/2015: MD feels that Pt's symptoms have decreased to the point that they can be managed in an outpatient setting.   3.  Goal(s): Stacey Weaver will demonstrate decreased signs and symptoms of anxiety.  Met:  Adequate for DC  Target date: 3-5 days from date of admission   As evidenced by: Stacey Weaver will utilize self rating of anxiety at 3 or below and demonstrated decreased signs of anxiety, or be deemed stable for discharge by MD 10/10/15: Pt was admitted with increased levels of anxiety and is currently rating those symptoms highly. Pt will demonstrated decreased symptoms of anxiety and rate it at 3/10 prior to d/c. 10/14/2015: MD feels that Pt's symptoms have decreased to the point that they can be managed in an outpatient setting.   5.  Goal(s): Stacey Weaver will demonstrate decreased signs of psychosis  . Met:  Adequate for DC . Target date: 3-5 days from date of admission  . As evidenced by: Stacey Weaver will demonstrate decreased frequency of AVH or return to baseline function    10/10/15: Pt reports hx of AVH, presents with    vague speech.     10/14/2015: MD feels that Pt's symptoms have decreased to the point that they can be managed in an outpatient setting.   Attendees:  Stacey Weaver:    Family:    Physician: Dr. Dwyane Dee, MD  10/14/2015 9:39 AM  Nursing: Verner Chol., RN  10/14/2015 9:39 AM  Clinical Social Worker Peri Maris, Latanya Presser 10/14/2015 9:39 AM  Other:  10/14/2015 9:39 AM  Clinical: Lars Pinks, RN Case manager  10/14/2015 9:39 AM  Other:  10/14/2015 9:39 AM  Other:     Peri Maris, LCSWA Clinical Social  Work [phone]

## 2015-10-14 NOTE — Discharge Summary (Signed)
Physician Discharge Summary Note  Patient:  Stacey Weaver is an 40 y.o., female MRN:  [id number] DOB:  1976/01/14 Patient phone:  [phone] (home)  Patient address:   [address] [place] [address],  Total Time spent with patient: 45 minutes  Date of Admission:  10/09/2015 Date of Discharge: 10/14/2015  Reason for Admission:  History of Present Illness:   Stacey Weaver, 40 y.o. Female. She is seen today. She tearfully states, "it would be better if they just buried me so that I would not have to be a burden to anyone. She reports that she lives with her mom and they have a tense relationship. She feels that her mother does not care for her. She states that she is employed and that her main problem is that her moods are always erratic.  She expresses frustration that her meds are never working right.   She was last here in 2012 for same c/o of strong suicidal ideations due to her relationship with her mother. She is established mental health care with Dr Clyde Canterbury and is prescribed Lamictal 25 mg daily, Zoloft 50 mg, Navane 3 mg daily. She states compliance with meds. Pt reports SI with no plan. Pt states she cannot contract for safety. Pt reports 2 previous SI attempts. Pt denies HI and AVH. Pt reports severe depression. According to the Pt, she is frustrated with life. Pt states she feels the world would be better without her. Pt states she feels she is a burden to the world. Pt reports hopelessness and helplessness. According to the Pt, she resides with mother and they do not have a good relationship. Pt states "no one care about me." Pt reports 1 previous hospitalization for SI and depression. Pt states she is seen by a psychiatrist. Pt is prescribed Lamictal, Sertruline, and Trazodone. Pt does not feel her medications are effective. Pt denies abuse. Pt denies SA.  Associated Signs/Symptoms: Depression Symptoms: depressed mood, (Hypo) Manic Symptoms:  Impulsivity, Irritable Mood, Labiality of Mood, Anxiety Symptoms: Excessive Worry, Psychotic Symptoms: Hallucinations: Command: "telling me to shop shop shop" PTSD Symptoms: NA Total Time spent with patient: 30 minutes  Past Psychiatric History: Admitted in 2012 with suicidal ideations.   Is the patient at risk to self? Yes.   Has the patient been a risk to self in the past 6 months? Yes.   Has the patient been a risk to self within the distant past? Yes.   Is the patient a risk to others? No.  Has the patient been a risk to others in the past 6 months? No.  Has the patient been a risk to others within the distant past? No.   Prior Inpatient Therapy:   Prior Outpatient Therapy:    Alcohol Screening: 1. How often do you have a drink containing alcohol?: Never 9. Have you or someone else been injured as a result of your drinking?: No 10. Has a relative or friend or a doctor or another health worker been concerned about your drinking or suggested you cut down?: No Alcohol Use Disorder Identification Test Final Score (AUDIT): 0 Brief Intervention: AUDIT score less than 7 or less-screening does not suggest unhealthy drinking-brief intervention not indicated Substance Abuse History in the last 12 months: Yes.  Consequences of Substance Abuse: NA Previous Psychotropic Medications: Abilify 10 mg d/c due to cost. She was on Klonopin but stopped as it was not as effective for her anxiety. She was on Wellbutrin XL 150 mg but she overdosed  on these during 2012 admission and this has subsequently been stopped.  Principal Problem: Bipolar I disorder, most recent episode depressed [facility]) Discharge Diagnoses: Patient Active Problem List   Diagnosis Date Noted  . Severe episode of recurrent major depressive disorder, without psychotic features (HCC) [F33.2]   . Insomnia [G47.00]   . MDD (major depressive disorder) (HCC) [F32.9] 10/10/2015  . Bipolar I disorder, most recent episode  depressed (HCC) [F31.30]   . Feeling suicidal [F48.9] 10/09/2015  . Rhabdomyolysis [M62.82] 08/08/2011  . Antihistamines overdose [T45.0X1A] 08/08/2011  . SSRI overdose [T43.221A] 08/07/2011  . Depression [F32.9] 08/07/2011  . Anxiety disorder [F41.9] 08/07/2011  . Obesity [E66.9] 08/07/2011  . Hypothyroidism [E03.9] 08/07/2011   Past Medical History:  Past Medical History  Diagnosis Date  . Bipolar 1 disorder (HCC)   . Asthma   . Migraine   . Anxiety     Past Surgical History  Procedure Laterality Date  . Knee surgery     Family History:  Family History  Problem Relation Age of Onset  . Hyperlipidemia Mother   . Hypertension Mother   . Heart disease Father   . Hyperlipidemia Maternal Grandmother   . Cancer Maternal Grandmother   . Cancer Maternal Grandfather    Family Psychiatric  History: Patient Denies Social History:  History  Alcohol Use No     History  Drug Use No    Social History   Social History  . Marital Status: Single    Spouse Name: N/A  . Number of Children: N/A  . Years of Education: N/A   Social History Main Topics  . Smoking status: Never Smoker   . Smokeless tobacco: None  . Alcohol Use: No  . Drug Use: No  . Sexual Activity: Yes    Birth Control/ Protection: Implant   Other Topics Concern  . None   Social History Narrative    Hospital Course:  SAFIYYAH VASCONEZ was admitted for Bipolar I disorder, most recent episode depressed (HCC) and crisis management.  She was treated with the following medications Lamictal 25 mg BID mood sx, Sertraline 50 mg daily depression, Tegretol 200 mg daily mood stabilization, Ativan 0.5 mg PRN anxiety.  Stacey Weaver was discharged with current medication and was instructed on how to take medications as prescribed; (details listed below under Medication List).  Medical problems were identified and treated as needed.  Home medications were restarted as appropriate.  Improvement was monitored by observation  and Stacey Weaver daily report of symptom reduction.  Emotional and mental status was monitored by daily self-inventory reports completed by Stacey Weaver and clinical staff.         Stacey Weaver was evaluated by the treatment team for stability and plans for continued recovery upon discharge.  Stacey Weaver motivation was an integral factor for scheduling further treatment.  Employment, transportation, bed availability, health status, family support, and any pending legal issues were also considered during her hospital stay.  She was offered further treatment options upon discharge including but not limited to Residential, Intensive Outpatient, and Outpatient treatment.  Stacey Weaver will follow up with the services as listed below under Follow Up Information.     Upon completion of this admission the [employer] was both mentally and medically stable for discharge denying suicidal/homicidal ideation, auditory/visual/tactile hallucinations, delusional thoughts and paranoia.       Physical Findings: AIMS: Facial and Oral Movements Muscles of Facial Expression: None, normal Lips and  Perioral Area: None, normal Jaw: None, normal Tongue: None, normal,Extremity Movements Upper (arms, wrists, hands, fingers): None, normal Lower (legs, knees, ankles, toes): None, normal, Trunk Movements Neck, shoulders, hips: None, normal, Overall Severity Severity of abnormal movements (highest score from questions above): None, normal Incapacitation due to abnormal movements: None, normal Patient's awareness of abnormal movements (rate only patient's report): No Awareness, Dental Status Current problems with teeth and/or dentures?: No Does patient usually wear dentures?: No  CIWA:    COWS:     Musculoskeletal: Strength & Muscle Tone: within normal limits Gait & Station: normal Patient leans: N/A  Psychiatric Specialty Exam: see MD SRA Physical Exam  ROS  Blood pressure 102/70, pulse 103,  temperature 98.4 F (36.9 C), temperature source Oral, resp. rate 16, height 5' 3.98" (1.625 m), weight 94.348 kg (208 lb).Body mass index is 35.73 kg/(m^2).  Sleep:  Number of Hours: 6.25     Have you used any form of tobacco in the last 30 days? (Cigarettes, Smokeless Tobacco, Cigars, and/or Pipes): No  Has this patient used any form of tobacco in the last 30 days? (Cigarettes, Smokeless Tobacco, Cigars, and/or Pipes) No  Blood Alcohol level:  Lab Results  Component Value Date   ETH <5 10/09/2015   ETH <11 05/10/2013    Metabolic Disorder Labs:  No results found for: HGBA1C, MPG No results found for: PROLACTIN No results found for: CHOL, TRIG, HDL, CHOLHDL, VLDL, LDLCALC  See Psychiatric Specialty Exam and Suicide Risk Assessment completed by Attending Physician prior to discharge.  Discharge destination:  Home  Is patient on multiple antipsychotic therapies at discharge:  No   Has Patient had three or more failed trials of antipsychotic monotherapy by history:  No  Recommended Plan for Multiple Antipsychotic Therapies: NA  Discharge Instructions    Discharge instructions    Complete by:  As directed   Discharge Recommendations:  The patient is being discharged to her family. Patient is to take her discharge medications as ordered. See follow up below. We recommend that she participate in individual therapy to target depressive and bipolar symptoms and improving coping skills. Discussed with patient the importance of making responsible decisions and talking with her support system. Pt has a good family support system that she can continue to use to help maximize her safety plan and treatment options. Encouraged patient to trust her outpatient provider and therapist to ensure that she gets the most information out of each session so that she can make more informative decisions about her care. SHe is asked to make sure she is familiar with the symptoms of depression, so that she  can advise her therapist when things begin to become abnormal for her. We recommend having her CBC and carbamazepine levels checked every 3 months due to being on a antipsychotic medication Please also watch weight and sleep as these medications can affect weight.  We recommend that she participate in family therapy to target the conflict with his family , and improving communication skills and conflict resolution skills. Family is to initiate/implement a contingency based behavioral model to address patient's behavior. The patient should abstain from all illicit substances, alcohol, and peer pressure. If the patient's symptoms worsen or do not continue to improve or if the patient becomes actively suicidal or homicidal then it is recommended that the patient return to the closest hospital emergency room or call 911 for further evaluation and treatment. National Suicide Prevention Lifeline 1800-SUICIDE or 713 650 [phone]. Please follow up with your primary medical  doctor for all other medical needs.     The patient has been educated on the possible side effects to medications and she/her guardian is to contact a medical professional and inform outpatient provider of any new side effects of medication. SHe is to take regular diet and activity as tolerated.  Family was educated about removing/locking any firearms, medications or dangerous products from the home.            Medication List    STOP taking these medications        sertraline 50 MG tablet  Commonly known as:  ZOLOFT     thiothixene 1 MG capsule  Commonly known as:  NAVANE     zonisamide 25 MG capsule  Commonly known as:  ZONEGRAN      TAKE these medications      Indication   acetaminophen 500 MG tablet  Commonly known as:  TYLENOL  Take 1,000 mg by mouth every 6 (six) hours as needed for moderate pain.      ALPRAZolam 0.25 MG tablet  Commonly known as:  XANAX  Take 0.25 mg by mouth 2 (two) times daily as needed for  anxiety.      carbamazepine 200 MG tablet  Commonly known as:  TEGRETOL  Take 1 tablet (200 mg total) by mouth 2 (two) times daily.   Indication:  Manic-Depression     hydrOXYzine 10 MG tablet  Commonly known as:  ATARAX/VISTARIL  Take 1 tablet (10 mg total) by mouth every 4 (four) hours as needed (anxiety).   Indication:  Anxiety Neurosis     lamoTRIgine 25 MG tablet  Commonly known as:  LAMICTAL  Take 1 tablet (25 mg total) by mouth 2 (two) times daily.   Indication:  Manic-Depression, Depression     methocarbamol 500 MG tablet  Commonly known as:  ROBAXIN  Take 1 tablet (500 mg total) by mouth 2 (two) times daily.      NEXPLANON 68 MG Impl implant  Generic drug:  etonogestrel  Inject 1 each into the skin once.      traZODone 150 MG tablet  Commonly known as:  DESYREL  Take 1 tablet (150 mg total) by mouth at bedtime and may repeat dose one time if needed.   Indication:  Trouble Sleeping           Follow-up Information    Follow up with [facility] On 10/29/2015.   Why:  Patient current for medications management w this provider, hospital discharge followup appointment on August 2 at 2:30 PM.  Please call to cancel/reschedule if needed.    Contact information:   [address] ,  [place], [place] [address] [phone]   Phone: [phone]  Fax:  [phone]      Follow up with [facility] On 10/21/2015.   Why:  at 8:00am for therapy with Dr. Noe Gens. Please arrive at 7:45 to complete paperwork. If you need to cancel or reschedule, please provide 24 hr notice to avoid charges   Contact information:   [address] [place] [address] [phone]      Follow-up recommendations:  Activity:  Increase activity as tolerated Diet:  regular house diet Other:  follow up with [facility] on 10/29/2015. You have enough medications until this appointment. Also follow up with  [facility].   Comments: Take all medications as  prescribed. Keep all follow-up appointments as scheduled.  Do not consume alcohol or use illegal drugs while on  prescription medications. Report any adverse effects from your medications to your primary care provider promptly.  In the event of recurrent symptoms or worsening symptoms, call 911, a crisis hotline, or go to the nearest emergency department for evaluation.    Signed: Truman Hayward, FNP 10/14/2015, 10:43 AM

## 2015-10-14 NOTE — Progress Notes (Signed)
Patient ID: Stacey Weaver, female   DOB: 10/23/1975, 40 y.o.   MRN: [id number] D: Patient reports her day was a little rough but has gotten better as the day progressed. Pt sitting in dayroom watching TV and interacting well with peers. Pt reports tolerating medication well without adverse effects. Denies SI/HI/AVH.No behavioral issues noted.  A: Support and encouragement offered as needed. Medications administered as prescribed.  R: Patient cooperative and safe on unit. Will continue to monitor patient for safety and stability.

## 2015-10-14 NOTE — [facility] Suicide Risk Assessment (Signed)
[facility]  Patient is a 40 year old diagnosed with BIpolar Disorder, transferred from [facility] ED for worsening of depression along with suicidal ideation. Patient was unable to contract for safety and reported that she was frustrated with life, felt that the world would be better without her.  Patient has has had several psychiatric admissions, most recently 2012. Patient has been taking Lamictal ( has been on it for years), Navane ( has been on it for 1.5 years ), Xanax ( 0.25 mg BID ), Trazodone , Zoloft (50mg  started 2 months ago). States she had been taking her medications only a couple of times per week. She follows up with Dr. Sharee PimpleBaharani for psychiatric medication management .Patient has a prior history of suicide attempts by overdosing .   Patient's medications during the hospitalization were adjusted and on the day of discharge, patient reported that she was doing fairly well, no longer felt depressed, was not having suicidal thoughts, any racing thoughts, any side effects with the medications. She also added that she would like to change her psychiatrist, reports that the social worker has given her information about neuropsychiatric clinic and she plans to call them. Discussed in length with patient the need to take medications daily as prescribed, patient states that she will do so. Patient also denies any psychotic symptoms, any thoughts of hurting anyone else, any paranoia. She reports that she's eating fine and sleeping well Principal Problem: Bipolar I disorder, most recent episode depressed [facility](HCC) Discharge Diagnoses:  Patient Active Problem List   Diagnosis Date Noted  . Severe episode of recurrent major depressive disorder, without psychotic features (HCC) [F33.2]   . Insomnia [G47.00]   . MDD (major depressive disorder) (HCC) [F32.9] 10/10/2015  . Bipolar I disorder, most recent episode depressed (HCC) [F31.30]   . Feeling suicidal [F48.9] 10/09/2015   . Rhabdomyolysis [M62.82] 08/08/2011  . Antihistamines overdose [T45.0X1A] 08/08/2011  . SSRI overdose [T43.221A] 08/07/2011  . Depression [F32.9] 08/07/2011  . Anxiety disorder [F41.9] 08/07/2011  . Obesity [E66.9] 08/07/2011  . Hypothyroidism [E03.9] 08/07/2011    Total Time spent with patient: 30 minutes  Musculoskeletal: Strength & Muscle Tone: within normal limits Gait & Station: normal Patient leans: N/A  Psychiatric Specialty Exam: ROS  Blood pressure 102/70, pulse 103, temperature 98.4 F (36.9 C), temperature source Oral, resp. rate 16, height 5' 3.98" (1.625 m), weight 94.348 kg (208 lb).Body mass index is 35.73 kg/(m^2).  General Appearance: Casual  Eye Contact::  Fair  Speech:  Clear and Coherent and Normal Rate  Volume:  Normal  Mood:  Euthymic  Affect:  Appropriate, Congruent and Full Range  Thought Process:  Coherent and Goal Directed  Orientation:  Full (Time, Place, and Person)  Thought Content:  WDL  Suicidal Thoughts:  No  Homicidal Thoughts:  No  Memory:  Immediate;   Fair Recent;   Fair Remote;   Fair  Judgement:  Intact  Insight:  Present  Psychomotor Activity:  Normal  Concentration:  Fair  Recall:  Good  Fund of Knowledge:Good  Language: Good  Akathisia:  No  Handed:  Right  AIMS (if indicated):     Assets:  Desire for Improvement Housing Physical Health Transportation  Sleep:  Number of Hours: 6.25  Cognition: WNL  ADL's:  Intact   Mental Status Per Nursing Assessment::   On Admission:  Suicidal ideation indicated by patient, Self-harm thoughts  Demographic Factors:  Low socioeconomic status  Loss Factors: Loss of significant relationship  Historical Factors:  Prior suicide attempts and Family history of mental illness or substance abuse  Risk Reduction Factors:   Living with another person, especially a relative  Continued Clinical Symptoms:  Bipolar Disorder:   Depressive phase  Cognitive Features That Contribute To  Risk:  Loss of executive function    Suicide Risk:  Mild:  Suicidal ideation of limited frequency, intensity, duration, and specificity.  There are no identifiable plans, no associated intent, mild dysphoria and related symptoms, good self-control (both objective and subjective assessment), few other risk factors, and identifiable protective factors, including available and accessible social support.  Follow-up Information    Follow up with [facility] On 10/29/2015.   Why:  Patient current for medications management w this provider, hospital discharge followup appointment on August 2 at 2:30 PM.  Please call to cancel/reschedule if needed.    Contact information:   [address] ,  [place], [place] [address] [phone]   Phone: [phone]  Fax:  [phone]      Follow up with [facility] On 10/21/2015.   Why:  at 8:00am for therapy with Dr. Noe Gens. Please arrive at 7:45 to complete paperwork. If you need to cancel or reschedule, please provide 24 hr notice to avoid charges   Contact information:   [address] [place] [address] [phone]      Plan Of Care/Follow-up recommendations:  Activity:  As tolerated Diet:  Regular Other:  Keep follow-up appointments and take medications regularly  Nelly Rout, MD 10/14/2015, 11:17 AM

## 2015-10-14 NOTE — Progress Notes (Signed)
  [name] :  Will you be returning to the same living situation after discharge:  Yes,  Pt returning home with family At discharge, do you have transportation home?: Yes,  Pt mother to pick up Do you have the ability to pay for your medications: Yes,  Pt provided with prescriptions  Release of information consent forms completed and in the chart;  Patient's signature needed at discharge.  Patient to Follow up at: Follow-up Information    Follow up with [facility] On 10/29/2015.   Why:  Patient current for medications management w this provider, hospital discharge followup appointment on August 2 at 2:30 PM.  Please call to cancel/reschedule if needed.    Contact information:   [address] ,  [place], [place] [address] [phone]   Phone: [phone]  Fax:  [phone]      [facility] On 10/21/2015.   Why:  at 8:00am for therapy with Dr. Noe GensPeters. Please arrive at 7:45 to complete paperwork. If you need to cancel or reschedule, please provide 24 hr notice to avoid charges   Contact information:   [address] [place] [address] [phone]      Next level of care provider has [facility] Link:no  Safety Planning and Suicide Prevention discussed: Yes,  with mother; see SPE note  Have you used any form of tobacco in the last 30 days? (Cigarettes, Smokeless Tobacco, Cigars, and/or Pipes): No  Has patient been referred to the Quitline?: N/A patient is not a smoker  Patient has been referred for addiction treatment: Yes  Stacey Weaver, Stacey Weaver M 10/14/2015, 9:47 AM

## 2015-10-14 NOTE — Progress Notes (Signed)
Patient discharged to lobby. Patient was stable and appreciative at that time. All papers and prescriptions were given and valuables returned. Verbal understanding expressed. Denies SI/HI and A/VH. Patient given opportunity to express concerns and ask questions.  

## 2015-10-14 NOTE — B[facility] Suicide Risk Assessment (Signed)
B[facility] INPATIENT:  Family/Significant Other Suicide Prevention Education  Suicide Prevention Education:  Education Completed; Stacey Weaver, Pt's mother [phone], has been identified by the patient as the family member/significant other with whom the patient will be residing, and identified as the person(s) who will aid the patient in the event of a mental health crisis (suicidal ideations/suicide attempt).  With written consent from the patient, the family member/significant other has been provided the following suicide prevention education, prior to the and/or following the discharge of the patient.  The suicide prevention education provided includes the following:  Suicide risk factors  Suicide prevention and interventions  National Suicide Hotline telephone number  [facility] assessment telephone number  [facility] Emergency Assistance 911  [facility] and/or Residential Mobile Crisis Unit telephone number  Request made of family/significant other to:  Remove weapons (e.g., guns, rifles, knives), all items previously/currently identified as safety concern.    Remove drugs/medications (over-the-counter, prescriptions, illicit drugs), all items previously/currently identified as a safety concern.  The family member/significant other verbalizes understanding of the suicide prevention education information provided.  The family member/significant other agrees to remove the items of safety concern listed above.  Stacey Weaver, Stacey Weaver 10/14/2015, 9:09 AM

## 2015-10-21 ENCOUNTER — Ambulatory Visit: Payer: Medicare PPO | Admitting: Psychiatry

## 2016-01-29 ENCOUNTER — Encounter: Payer: Self-pay | Admitting: Family Medicine

## 2016-01-29 ENCOUNTER — Ambulatory Visit (INDEPENDENT_AMBULATORY_CARE_PROVIDER_SITE_OTHER): Payer: BLUE CROSS/BLUE SHIELD | Admitting: Family Medicine

## 2016-01-29 VITALS — BP 120/78 | HR 96 | Temp 98.2°F | Resp 12 | Ht 63.98 in | Wt 208.0 lb

## 2016-01-29 DIAGNOSIS — E785 Hyperlipidemia, unspecified: Secondary | ICD-10-CM | POA: Diagnosis not present

## 2016-01-29 DIAGNOSIS — R233 Spontaneous ecchymoses: Secondary | ICD-10-CM

## 2016-01-29 DIAGNOSIS — Z6835 Body mass index (BMI) 35.0-35.9, adult: Secondary | ICD-10-CM | POA: Diagnosis not present

## 2016-01-29 LAB — CBC WITH DIFFERENTIAL/PLATELET
BASOS PCT: 0.8 % (ref 0.0–3.0)
Basophils Absolute: 0 10*3/uL (ref 0.0–0.1)
EOS ABS: 0.1 10*3/uL (ref 0.0–0.7)
Eosinophils Relative: 1.5 % (ref 0.0–5.0)
HEMATOCRIT: 41.6 % (ref 36.0–46.0)
Hemoglobin: 13.4 g/dL (ref 12.0–15.0)
LYMPHS PCT: 45.5 % (ref 12.0–46.0)
Lymphs Abs: 2.5 10*3/uL (ref 0.7–4.0)
MCHC: 32.3 g/dL (ref 30.0–36.0)
MCV: 86.2 fl (ref 78.0–100.0)
Monocytes Absolute: 0.5 10*3/uL (ref 0.1–1.0)
Monocytes Relative: 8.1 % (ref 3.0–12.0)
NEUTROS ABS: 2.5 10*3/uL (ref 1.4–7.7)
Neutrophils Relative %: 44.1 % (ref 43.0–77.0)
PLATELETS: 291 10*3/uL (ref 150.0–400.0)
RBC: 4.83 Mil/uL (ref 3.87–5.11)
RDW: 12.7 % (ref 11.5–15.5)
WBC: 5.6 10*3/uL (ref 4.0–10.5)

## 2016-01-29 LAB — LIPID PANEL
CHOL/HDL RATIO: 4
Cholesterol: 250 mg/dL — ABNORMAL HIGH (ref 0–200)
HDL: 65.4 mg/dL (ref >39.00)
LDL Cholesterol: 167 mg/dL — ABNORMAL HIGH (ref 0–99)
NONHDL: 184.13
Triglycerides: 87 mg/dL (ref 0.0–149.0)
VLDL: 17.4 mg/dL (ref 0.0–40.0)

## 2016-01-29 NOTE — Progress Notes (Signed)
HPI:   Ms.Katianne S Rubye Oaksalmer is a 40 y.o. female, who is here today to establish care with me.  Former PCP: Dr Nehemiah SettlePolite. Last preventive routine visit: gyn regularly but has not had a routine CPE in "a while"  Concerns today: elevated cholesterol and bruising.   -She is reporting labs done through work and her cholesterol was very abnormal. She is not sure about numbers and states that lab was not fasting. Labs were done around 3 pm after lunch. She states that glucose was normal.  She does not exercise regularly and does not follow a healthful diet.    -She is also concerned about bruising she notices on LE intermittently and with no Hx of trauma. She is not on Aspirin and denies frequent use of NSAID's OTC. No associated arthralgias or myalgias. Lesions are not tender. She has not identified exacerbating factors or a pattern. Usually lesions resolve spontaneously in a couple weeks.   She denies epistaxis, gum bleeding, gross hematuria, blood in stool, or melena. No known Hx of hematologic disorders as sickle cell or thalassemias.    LMP 12/11/15 , denies Hx of heavy menstrual periods.    Lab Results  Component Value Date   WBC 7.3 10/09/2015   HGB 12.2 10/09/2015   HCT 38.2 10/09/2015   MCV 85.8 10/09/2015   PLT 315 10/09/2015    Lab Results  Component Value Date   TSH 1.36 06/30/2015     Hx of bipolar disorder, she is supposed to follow with psychiatrists monthly but has not done so, planning on scheduling an appt.  She is currently taking her medications as instructed.  Hx of migraines, states that has not had headaches in a while.    Review of Systems  Constitutional: Negative for activity change, appetite change, fatigue, fever and unexpected weight change.  HENT: Negative for mouth sores, nosebleeds and trouble swallowing.   Eyes: Negative for redness and visual disturbance.  Respiratory: Negative for cough, shortness of breath and wheezing.     Cardiovascular: Negative for chest pain, palpitations and leg swelling.  Gastrointestinal: Negative for abdominal pain, blood in stool, nausea and vomiting.       Negative for changes in bowel habits.  Endocrine: Negative for cold intolerance and heat intolerance.  Genitourinary: Negative for decreased urine volume, difficulty urinating and hematuria.  Musculoskeletal: Negative for gait problem and myalgias.  Skin: Negative for rash and wound.  Neurological: Negative for syncope, weakness, numbness and headaches.  Hematological: Negative for adenopathy. Bruises/bleeds easily.  Psychiatric/Behavioral: Negative for confusion. The patient is not nervous/anxious.       Current Outpatient Prescriptions on File Prior to Visit  Medication Sig Dispense Refill  . ALPRAZolam (XANAX) 0.25 MG tablet Take 0.25 mg by mouth at bedtime as needed for anxiety.     . carbamazepine (TEGRETOL) 200 MG tablet Take 1 tablet (200 mg total) by mouth 2 (two) times daily. 60 tablet 0  . lamoTRIgine (LAMICTAL) 25 MG tablet Take 1 tablet (25 mg total) by mouth 2 (two) times daily. 60 tablet 0   No current facility-administered medications on file prior to visit.      Past Medical History:  Diagnosis Date  . Anxiety   . Asthma   . Bipolar 1 disorder (HCC)   . Migraine    Allergies  Allergen Reactions  . Almotriptan Malate Shortness Of Breath  . Cymbalta [Duloxetine Hcl] Other (See Comments)    suicidal thoughts  . Imitrex [Sumatriptan]  Shortness Of Breath and Other (See Comments)    Muscle spasms  . Zomig Shortness Of Breath  . Geodon [Ziprasidone Hcl] Other (See Comments)    hallucinations  . Lactose Intolerance (Gi) Diarrhea and Other (See Comments)    Gas and bloated   . Penicillins Other (See Comments)    GI upset  Has patient had a PCN reaction causing immediate rash, facial/tongue/throat swelling, SOB or lightheadedness with hypotension: No Has patient had a PCN reaction causing severe rash  involving mucus membranes or skin necrosis: No Has patient had a PCN reaction that required hospitalization No Has patient had a PCN reaction occurring within the last 10 years: Yes If all of the above answers are "NO", then may proceed with Cephalosporin use.   . Zyprexa [Olanzapine] Other (See Comments)    Hallucinations     Family History  Problem Relation Age of Onset  . Hyperlipidemia Mother   . Hypertension Mother   . Heart disease Father 70    CAD  . Hyperlipidemia Maternal Grandmother   . Cancer Maternal Grandmother   . Cancer Maternal Grandfather     Social History   Social History  . Marital status: Single    Spouse name: N/A  . Number of children: N/A  . Years of education: N/A   Social History Main Topics  . Smoking status: Never Smoker  . Smokeless tobacco: Never Used  . Alcohol use No  . Drug use: No  . Sexual activity: Yes    Birth control/ protection: Implant   Other Topics Concern  . None   Social History Narrative  . None    Vitals:   01/29/16 1257  BP: 120/78  Pulse: 96  Resp: 12  Temp: 98.2 F (36.8 C)   O2 sat at RA 98%  Body mass index is 35.73 kg/m.    Physical Exam  Nursing note and vitals reviewed. Constitutional: She is oriented to person, place, and time. She appears well-developed. No distress.  HENT:  Head: Atraumatic.  Mouth/Throat: Oropharynx is clear and moist and mucous membranes are normal.  Eyes: Conjunctivae and EOM are normal. Pupils are equal, round, and reactive to light.  Neck: No tracheal deviation present. No thyroid mass and no thyromegaly present.  Cardiovascular: Normal rate and regular rhythm.   No murmur heard. Pulses:      Dorsalis pedis pulses are 2+ on the right side, and 2+ on the left side.  Respiratory: Effort normal and breath sounds normal. No respiratory distress.  GI: Soft. She exhibits no mass. There is no hepatomegaly. There is no tenderness.  Musculoskeletal: She exhibits no edema or  tenderness.  Lymphadenopathy:    She has no cervical adenopathy.  Neurological: She is alert and oriented to person, place, and time. She has normal strength. Coordination normal.  Skin: Skin is warm. Ecchymosis noted. No purpura and no rash noted. No erythema.  Scattered 1-1.5 cm ecchymotic lesions on LE, bilateral, total 4. No tenderness or hematoma appreciated.   Psychiatric: She has a normal mood and affect.  Well groomed, good eye contact.      ASSESSMENT AND PLAN:     Jenita was seen today for establish care.  Diagnoses and all orders for this visit:  Spontaneous ecchymosis  Reassured. Possible causes dicussed, minor trauma, venous fragility among some. At this time it does not seem to be a concerning problems. Some of her current medications could potentially aggravate problem, Carbamazepine and maybe Lamictal. Further recommendations will be  given according to lab results. Monitor for changes and follow as needed.  -     CBC w/Diff  Hyperlipidemia, unspecified hyperlipidemia type  Low fat diet discussed and recommended as first line of treatment. Today she is fasting, further recommendations will be given accordingly. F/U in 6-12 months.   -     Lipid panel  BMI 35.0-35.9,adult  We discussed benefits of wt loss as well as adverse effects of obesity. Consistency with healthy diet and physical activity recommended.      Betty G. SwazilandJordan, MD  [facility]. [place] office.

## 2016-01-29 NOTE — Patient Instructions (Signed)
A few things to remember from today's visit:   Spontaneous ecchymosis - Plan: CBC w/Diff  Hyperlipidemia, unspecified hyperlipidemia type - Plan: Lipid panel  What are some tips for weight loss?  People become overweight for many reasons. Weight issues can run in families. They can be caused by unhealthy behaviors and a person's environment. Certain health problems and medicines can also lead to weight gain. There are some simple things you can do to reach and maintain a healthy weight:  Eat small more frequent healthy meals instead 3 bid meals. Also Weight Watchers is a good option. Avoid sweet drinks. These include regular soft drinks, fruit juices, fruit drinks, energy drinks, sweetened iced tea, and flavored milk. Avoid fast foods. Fast foods such as french fries, hamburgers, chicken nuggets, and pizza are high in calories and can cause weight gain. Eat a healthy breakfast. People who skip breakfast tend to weigh more. Don't watch more than two hours of television per day. Chew sugar-free gum between meals to cut down on snacking. Avoid grocery shopping when you're hungry. Pack a healthy lunch instead of eating out to control what and how much you eat. Eat a lot of fruits and vegetables. Aim for about 2 cups of fruit and 2 to 3 cups of vegetables per day. Aim for 150 minutes per week of moderate-intensity exercise (such as brisk walking), or 75 minutes per week of vigorous exercise (such as jogging or running). OR 15-30 min of daily brisk walking. Be more active. Small changes in physical activity can easily be added to your daily routine. For example, take the stairs instead of the elevator. Take a walk with your family. A daily walk is a great way to get exercise and to catch up on the day's events.   Please be sure medication list is accurate. If a new problem present, please set up appointment sooner than planned today.

## 2016-02-12 ENCOUNTER — Ambulatory Visit (INDEPENDENT_AMBULATORY_CARE_PROVIDER_SITE_OTHER): Payer: BLUE CROSS/BLUE SHIELD | Admitting: Family Medicine

## 2016-02-12 VITALS — BP 118/80 | HR 87 | Temp 98.5°F | Resp 16 | Ht 64.0 in | Wt 211.0 lb

## 2016-02-12 DIAGNOSIS — M549 Dorsalgia, unspecified: Secondary | ICD-10-CM

## 2016-02-12 DIAGNOSIS — G44209 Tension-type headache, unspecified, not intractable: Secondary | ICD-10-CM | POA: Diagnosis not present

## 2016-02-12 DIAGNOSIS — M62838 Other muscle spasm: Secondary | ICD-10-CM | POA: Diagnosis not present

## 2016-02-12 MED ORDER — IBUPROFEN 800 MG PO TABS: 800.0000 mg | ORAL_TABLET | Freq: Three times a day (TID) | ORAL | 0 refills | Status: DC | PRN

## 2016-02-12 MED ORDER — LIDOCAINE 4 % EX CREA: 1.0000 "application " | TOPICAL_CREAM | Freq: Three times a day (TID) | CUTANEOUS | 0 refills | Status: DC | PRN

## 2016-02-12 MED ORDER — CYCLOBENZAPRINE HCL 5 MG PO TABS: 5.0000 mg | ORAL_TABLET | Freq: Three times a day (TID) | ORAL | 0 refills | Status: DC | PRN

## 2016-02-12 NOTE — Progress Notes (Signed)
Chief Complaint  Patient presents with  . Back Pain    upper, X this morning   . Headache    HPI  Pt is an established patient who presents with upper back pain  Started this morning She reports that her upper back between the shoulder blades and at the base of the neck feels like a throbbing pain that is worse with movement She has had similar symptoms in the past but in the lower back She reports that her pain is 5/10 She reports that walking aggravates it and turning her head aggravates it Turning aggravates it No previous injury Denies vision changes Denies weakness in the arms bilaterally No numbness or tingling    Past Medical History:  Diagnosis Date  . Anxiety   . Asthma   . Bipolar 1 disorder (HCC)   . Migraine     Current Outpatient Prescriptions  Medication Sig Dispense Refill  . ALPRAZolam (XANAX) 0.25 MG tablet Take 0.25 mg by mouth at bedtime as needed for anxiety.     . carbamazepine (TEGRETOL) 200 MG tablet Take 1 tablet (200 mg total) by mouth 2 (two) times daily. 60 tablet 0  . lamoTRIgine (LAMICTAL) 25 MG tablet Take 1 tablet (25 mg total) by mouth 2 (two) times daily. 60 tablet 0  . sertraline (ZOLOFT) 50 MG tablet Take 50 mg by mouth 2 (two) times daily.    . traZODone (DESYREL) 100 MG tablet Take 3 tablets by mouth at bedtime.    . cyclobenzaprine (FLEXERIL) 5 MG tablet Take 1 tablet (5 mg total) by mouth 3 (three) times daily as needed for muscle spasms. 15 tablet 0  . ibuprofen (ADVIL,MOTRIN) 800 MG tablet Take 1 tablet (800 mg total) by mouth every 8 (eight) hours as needed. 30 tablet 0  . lidocaine (ASPERCREME W/LIDOCAINE) 4 % cream Apply 1 application topically 3 (three) times daily as needed. 30 g 0   No current facility-administered medications for this visit.     Allergies:  Allergies  Allergen Reactions  . Almotriptan Malate Shortness Of Breath  . Cymbalta [Duloxetine Hcl] Other (See Comments)    suicidal thoughts  . Imitrex  [Sumatriptan] Shortness Of Breath and Other (See Comments)    Muscle spasms  . Zomig Shortness Of Breath  . Geodon [Ziprasidone Hcl] Other (See Comments)    hallucinations  . Lactose Intolerance (Gi) Diarrhea and Other (See Comments)    Gas and bloated   . Penicillins Other (See Comments)    GI upset  Has patient had a PCN reaction causing immediate rash, facial/tongue/throat swelling, SOB or lightheadedness with hypotension: No Has patient had a PCN reaction causing severe rash involving mucus membranes or skin necrosis: No Has patient had a PCN reaction that required hospitalization No Has patient had a PCN reaction occurring within the last 10 years: Yes If all of the above answers are "NO", then may proceed with Cephalosporin use.   . Zyprexa [Olanzapine] Other (See Comments)    Hallucinations     Past Surgical History:  Procedure Laterality Date  . KNEE SURGERY      Social History   Social History  . Marital status: Single    Spouse name: N/A  . Number of children: N/A  . Years of education: N/A   Social History Main Topics  . Smoking status: Never Smoker  . Smokeless tobacco: Never Used  . Alcohol use No  . Drug use: No  . Sexual activity: Yes    Birth  control/ protection: Implant   Other Topics Concern  . None   Social History Narrative  . None    ROS  Objective: Vitals:   02/12/16 1153  BP: 118/80  Pulse: 87  Resp: 16  Temp: 98.5 F (36.9 C)  TempSrc: Oral  SpO2: 100%  Weight: 211 lb (95.7 kg)  Height: 5\' 4"  (1.626 m)    Physical Exam  Constitutional: She is oriented to person, place, and time. She appears well-developed and well-nourished.  HENT:  Head: Normocephalic and atraumatic.  Eyes: Conjunctivae and EOM are normal.  Musculoskeletal:       Cervical back: She exhibits tenderness and spasm. She exhibits no bony tenderness, no swelling, no edema, no deformity, no laceration and no pain.       Back:  Neurological: She is alert and  oriented to person, place, and time. She displays normal reflexes. No cranial nerve deficit. She exhibits normal muscle tone. Coordination normal.    Assessment and Plan Annjanette was seen today for back pain and headache.  Diagnoses and all orders for this visit:  Tension headache Trapezius muscle spasm Acute upper back pain  Advised pt not to take flexeril and sertraline because it can increase risk of serotonin syndrome Discussed that she should use topical aspercreme with lidocaine for relief and NSAIDs for pain   -     lidocaine (ASPERCREME W/LIDOCAINE) 4 % cream; Apply 1 application topically 3 (three) times daily as needed. -     ibuprofen (ADVIL,MOTRIN) 800 MG tablet; Take 1 tablet (800 mg total) by mouth every 8 (eight) hours as needed. -     cyclobenzaprine (FLEXERIL) 5 MG tablet; Take 1 tablet (5 mg total) by mouth 3 (three) times daily as needed for muscle spasms.     Zoe A Stallings

## 2016-02-12 NOTE — Patient Instructions (Addendum)
     IF you received an x-ray today, you will receive an invoice from [facility]. Please contact [facility] at [phone] with questions or concerns regarding your invoice.   IF you received labwork today, you will receive an invoice from [facility]. Please contact [facility] at [phone] with questions or concerns regarding your invoice.   Our billing staff will not be able to assist you with questions regarding bills from these companies.  You will be contacted with the lab results as soon as they are available. The fastest way to get your results is to activate your My Chart account. Instructions are located on the last page of this paperwork. If you have not heard from us regarding the results in 2 weeks, please contact this office.     Muscle Cramps and Spasms Muscle cramps and spasms are when muscles tighten by themselves. They usually get better within minutes. Muscle cramps are painful. They are usually stronger and last longer than muscle spasms. Muscle spasms may or may not be painful. They can last a few seconds or much longer. HOME CARE  Drink enough fluid to keep your pee (urine) clear or pale yellow.  Massage, stretch, and relax the muscle.  Use a warm towel, heating pad, or warm shower water on tight muscles.  Place ice on the muscle if it is tender or in pain.  Put ice in a plastic bag.  Place a towel between your skin and the bag.  Leave the ice on for 15-20 minutes, 3-4 times a day.  Only take medicine as told by your doctor. GET HELP RIGHT AWAY IF:  Your cramps or spasms get worse, happen more often, or do not get better with time. MAKE SURE YOU:  Understand these instructions.  Will watch your condition.  Will get help right away if you are not doing well or get worse. This information is not intended to replace advice given to you by your health care provider. Make sure you discuss any questions you have  with your health care provider. Document Released: 02/26/2008 Document Revised: 07/10/2012 Document Reviewed: 12/17/2014 Elsevier Interactive Patient Education  2017 [employer]  

## 2016-03-01 ENCOUNTER — Ambulatory Visit: Payer: Self-pay | Admitting: Nurse Practitioner

## 2016-03-30 ENCOUNTER — Other Ambulatory Visit: Payer: Self-pay | Admitting: Obstetrics & Gynecology

## 2016-03-30 ENCOUNTER — Other Ambulatory Visit (HOSPITAL_COMMUNITY)
Admission: RE | Admit: 2016-03-30 | Discharge: 2016-03-30 | Disposition: A | Discharge status: Home / Self Care | Payer: BLUE CROSS/BLUE SHIELD | Source: Ambulatory Visit | Attending: Obstetrics & Gynecology | Admitting: Obstetrics & Gynecology

## 2016-03-30 DIAGNOSIS — Z1151 Encounter for screening for human papillomavirus (HPV): Secondary | ICD-10-CM | POA: Insufficient documentation

## 2016-03-30 DIAGNOSIS — Z01419 Encounter for gynecological examination (general) (routine) without abnormal findings: Secondary | ICD-10-CM | POA: Diagnosis present

## 2016-04-02 LAB — CYTOLOGY - PAP
DIAGNOSIS: NEGATIVE
HPV: NOT DETECTED

## 2016-09-14 ENCOUNTER — Encounter (HOSPITAL_COMMUNITY): Payer: Self-pay | Admitting: Emergency Medicine

## 2016-09-14 ENCOUNTER — Ambulatory Visit (HOSPITAL_COMMUNITY)
Admission: EM | Admit: 2016-09-14 | Discharge: 2016-09-14 | Disposition: A | Discharge status: Nursing Home (Includes ICF) | Payer: BLUE CROSS/BLUE SHIELD | Source: Home / Self Care

## 2016-09-14 ENCOUNTER — Emergency Department (HOSPITAL_COMMUNITY): Payer: BLUE CROSS/BLUE SHIELD

## 2016-09-14 ENCOUNTER — Emergency Department (HOSPITAL_COMMUNITY)
Admission: EM | Admit: 2016-09-14 | Discharge: 2016-09-14 | Disposition: A | Discharge status: Home / Self Care | Payer: BLUE CROSS/BLUE SHIELD | Attending: Emergency Medicine | Admitting: Emergency Medicine

## 2016-09-14 DIAGNOSIS — R079 Chest pain, unspecified: Secondary | ICD-10-CM | POA: Diagnosis present

## 2016-09-14 DIAGNOSIS — E039 Hypothyroidism, unspecified: Secondary | ICD-10-CM | POA: Diagnosis not present

## 2016-09-14 DIAGNOSIS — J45909 Unspecified asthma, uncomplicated: Secondary | ICD-10-CM | POA: Diagnosis not present

## 2016-09-14 DIAGNOSIS — Z79899 Other long term (current) drug therapy: Secondary | ICD-10-CM | POA: Insufficient documentation

## 2016-09-14 DIAGNOSIS — R091 Pleurisy: Secondary | ICD-10-CM | POA: Diagnosis not present

## 2016-09-14 DIAGNOSIS — R0602 Shortness of breath: Secondary | ICD-10-CM | POA: Diagnosis not present

## 2016-09-14 LAB — BASIC METABOLIC PANEL
ANION GAP: 7 (ref 5–15)
BUN: 5 mg/dL — ABNORMAL LOW (ref 6–20)
CHLORIDE: 105 mmol/L (ref 101–111)
CO2: 23 mmol/L (ref 22–32)
Calcium: 9.3 mg/dL (ref 8.9–10.3)
Creatinine, Ser: 0.85 mg/dL (ref 0.44–1.00)
GFR calc Af Amer: >60 mL/min (ref >60)
GLUCOSE: 92 mg/dL (ref 65–99)
POTASSIUM: 3.9 mmol/L (ref 3.5–5.1)
SODIUM: 135 mmol/L (ref 135–145)

## 2016-09-14 LAB — I-STAT TROPONIN, ED
Troponin i, poc: 0 ng/mL (ref 0.00–0.08)
Troponin i, poc: 0 ng/mL (ref 0.00–0.08)

## 2016-09-14 LAB — CBC
HEMATOCRIT: 39.7 % (ref 36.0–46.0)
HEMOGLOBIN: 12.8 g/dL (ref 12.0–15.0)
MCH: 28.2 pg (ref 26.0–34.0)
MCHC: 32.2 g/dL (ref 30.0–36.0)
MCV: 87.4 fL (ref 78.0–100.0)
Platelets: 260 10*3/uL (ref 150–400)
RBC: 4.54 MIL/uL (ref 3.87–5.11)
RDW: 12.2 % (ref 11.5–15.5)
WBC: 6.3 10*3/uL (ref 4.0–10.5)

## 2016-09-14 LAB — D-DIMER, QUANTITATIVE: D-Dimer, Quant: 0.29 ug{FEU}/mL (ref 0.00–0.50)

## 2016-09-14 MED ORDER — HYDROCODONE-ACETAMINOPHEN 5-325 MG PO TABS: 1.0000 | ORAL_TABLET | ORAL | 0 refills | Status: DC | PRN

## 2016-09-14 MED ORDER — NAPROXEN 500 MG PO TABS: 500.0000 mg | ORAL_TABLET | Freq: Two times a day (BID) | ORAL | 0 refills | Status: DC

## 2016-09-14 MED ORDER — KETOROLAC TROMETHAMINE 30 MG/ML IJ SOLN
30.0000 mg | Freq: Once | INTRAMUSCULAR | Status: AC
  Administered 2016-09-14: 30 mg via INTRAVENOUS
  Filled 2016-09-14: qty 1

## 2016-09-14 NOTE — ED Triage Notes (Signed)
Pt here for CP and SOB starting today; pt hyperventilating and tearful

## 2016-09-14 NOTE — ED Notes (Signed)
Pt called out stating she was having more difficulty breathing and that her left sided chest pain was coming back. Repeat EKG done and given to Dr. Fayrene FearingJames.

## 2016-09-14 NOTE — Discharge Instructions (Signed)
Medications as prescribed. Return to ER with any new, or worsening symptoms.

## 2016-09-14 NOTE — ED Notes (Signed)
Pt taken back into triage for blood draw

## 2016-09-15 NOTE — ED Provider Notes (Signed)
[facility] DEPT Provider Note   CSN: [id number] Arrival date & time: 09/14/16  1235     History   Chief Complaint Chief Complaint  Patient presents with  . Chest Pain  . Shortness of Breath    HPI Stacey Weaver is a 41 y.o. female. Chief complaint is chest pain, and palpitations  HPI: Patient presents for left-sided pleuritic chest pain and palpitations since yesterday. She has history of bipolar disorder and anxiety. Currently on, and compliant with medications. Denies any recent exacerbations are episodes of anxiety. During the day yesterday, and more frequent today she describes a feeling of palpitations. No description of tachycardia palpitations. Describes isolated episodes of feeling her heart move her shift in her chest. Has some intermittent left-sided pleuritic chest pain. Not severe, but persistent "starting to worry me". She is a nonsmoker. She does not take by mouth hormones, but has a hormone containing IUD. Family history negative for heart disease and DVT and PE. No leg swelling or other symptoms. She is a nonsmoker.  Past Medical History:  Diagnosis Date  . Anxiety   . Asthma   . Bipolar 1 disorder (HCC)   . Migraine     Patient Active Problem List   Diagnosis Date Noted  . Hyperlipidemia 01/29/2016  . Severe episode of recurrent major depressive disorder, without psychotic features (HCC)   . Insomnia   . MDD (major depressive disorder) 10/10/2015  . Bipolar I disorder, most recent episode depressed (HCC)   . Feeling suicidal 10/09/2015  . Rhabdomyolysis 08/08/2011  . Antihistamines overdose 08/08/2011  . SSRI overdose 08/07/2011  . Depression 08/07/2011  . Anxiety disorder 08/07/2011  . BMI 35.0-35.9,adult 08/07/2011  . Hypothyroidism 08/07/2011    Past Surgical History:  Procedure Laterality Date  . KNEE SURGERY      OB History    No data available       Home Medications    Prior to Admission medications   Medication Sig Start  Date End Date Taking? Authorizing Provider  ALPRAZolam Prudy Feeler(XANAX) 0.25 MG tablet Take 0.25 mg by mouth at bedtime as needed for anxiety.    Yes [provider]  carbamazepine (TEGRETOL) 200 MG tablet Take 1 tablet (200 mg total) by mouth 2 (two) times daily. 10/14/15  Yes Starkes, Juel Burrowakia S, FNP  lamoTRIgine (LAMICTAL) 25 MG tablet Take 1 tablet (25 mg total) by mouth 2 (two) times daily. 10/14/15  Yes Starkes, Juel Burrowakia S, FNP  sertraline (ZOLOFT) 50 MG tablet Take 50 mg by mouth 2 (two) times daily.   Yes [provider]  traZODone (DESYREL) 100 MG tablet Take 3 tablets by mouth at bedtime.   Yes [provider]  HYDROcodone-acetaminophen (NORCO/VICODIN) 5-325 MG tablet Take 1 tablet by mouth every 4 (four) hours as needed. 09/14/16   Rolland PorterJames, Khamarion Bjelland, MD  naproxen (NAPROSYN) 500 MG tablet Take 1 tablet (500 mg total) by mouth 2 (two) times daily. 09/14/16   Rolland PorterJames, Shadaya Marschner, MD    Family History Family History  Problem Relation Age of Onset  . Hyperlipidemia Mother   . Hypertension Mother   . Heart disease Father 3046       CAD  . Hyperlipidemia Maternal Grandmother   . Cancer Maternal Grandmother   . Cancer Maternal Grandfather     Social History Social History  Substance Use Topics  . Smoking status: Never Smoker  . Smokeless tobacco: Never Used  . Alcohol use No     Allergies   Almotriptan malate; Cymbalta [duloxetine  hcl]; Imitrex [sumatriptan]; Zomig; Geodon [ziprasidone hcl]; Lactose intolerance (gi); Penicillins; and Zyprexa [olanzapine]   Review of Systems Review of Systems  Constitutional: Negative for appetite change, chills, diaphoresis, fatigue and fever.  HENT: Negative for mouth sores, sore throat and trouble swallowing.   Eyes: Negative for visual disturbance.  Respiratory: Negative for cough, chest tightness, shortness of breath and wheezing.   Cardiovascular: Positive for chest pain and palpitations.  Gastrointestinal: Negative for abdominal distention,  abdominal pain, diarrhea, nausea and vomiting.  Endocrine: Negative for polydipsia, polyphagia and polyuria.  Genitourinary: Negative for dysuria, frequency and hematuria.  Musculoskeletal: Negative for gait problem.  Skin: Negative for color change, pallor and rash.  Neurological: Negative for dizziness, syncope, light-headedness and headaches.  Hematological: Does not bruise/bleed easily.  Psychiatric/Behavioral: Negative for behavioral problems and confusion.     Physical Exam Updated Vital Signs BP (!) 100/59   Pulse 68   Temp 98.3 F (36.8 C) (Oral)   Resp 11   SpO2 100%   Physical Exam  Constitutional: She is oriented to person, place, and time. She appears well-developed and well-nourished. No distress.  HENT:  Head: Normocephalic.  Eyes: Conjunctivae are normal. Pupils are equal, round, and reactive to light. No scleral icterus.  Neck: Normal range of motion. Neck supple. No thyromegaly present.  Cardiovascular: Normal rate and regular rhythm.  Exam reveals no gallop and no friction rub.   No murmur heard. Sinus rhythm. She has occasional PACs. These do reproduce her symptoms of the "palpitations".  Pulmonary/Chest: Effort normal and breath sounds normal. No respiratory distress. She has no wheezes. She has no rales.  Abdominal: Soft. Bowel sounds are normal. She exhibits no distension. There is no tenderness. There is no rebound.  Musculoskeletal: Normal range of motion.  Neurological: She is alert and oriented to person, place, and time.  Skin: Skin is warm and dry. No rash noted.  Psychiatric: She has a normal mood and affect. Her behavior is normal.     ED Treatments / Results  Labs (all labs ordered are listed, but only abnormal results are displayed) Labs Reviewed  BASIC METABOLIC PANEL - Abnormal; Notable for the following:       Result Value   BUN 5 (*)    All other components within normal limits  CBC  D-DIMER, QUANTITATIVE (NOT AT [facility])  Rosezena Sensor, ED  I-STAT TROPOININ, ED    EKG  EKG Interpretation  Date/Time:  Tuesday September 14 2016 12:42:19 EDT Ventricular Rate:  101 PR Interval:  130 QRS Duration: 68 QT Interval:  352 QTC Calculation: 456 R Axis:   77 Text Interpretation:  Sinus tachycardia T wave abnormality, consider inferolateral ischemia Abnormal ECG No significant change vs 2013 Confirmed by Rolland Porter ([id number]) on 09/14/2016 4:48:37 PM       Radiology Dg Chest 2 View  Result Date: 09/14/2016 CLINICAL DATA:  Two days of chest pain. Dizziness today. History of asthma, never smoked. EXAM: CHEST  2 VIEW COMPARISON:  Chest x-ray of June 30, 2012 FINDINGS: The lungs are adequately inflated and clear. The heart is top-normal in size but stable. The pulmonary vascularity is normal. The mediastinum is normal in width. The trachea is midline. The bony thorax exhibits no acute abnormality. IMPRESSION: There is no acute cardiopulmonary abnormality. Electronically Signed   By: David  [place] M.D.   On: 09/14/2016 13:24    Procedures Procedures (including critical care time)  Medications Ordered in ED Medications  ketorolac (TORADOL) 30 MG/ML injection 30  mg (30 mg Intravenous Given 09/14/16 1759)     Initial Impression / Assessment and Plan / ED Course  I have reviewed the triage vital signs and the nursing notes.  Pertinent labs & imaging results that were available during my care of the patient were reviewed by me and considered in my medical decision making (see chart for details).    Normal exam. EKG, initial, Dr. troponin normal. D-dimer normal. Symptoms do improve after medications. Likely pleurisy and PACs. She has had difficulty with sleep recently, has been drinking a "large coffee" to the course of the day. Was asked to avoid caffeine. Hopefully with avoidance of caffeine improving sleep her symptoms may improve with sees. Primary care follow-up. Anti-inflammatories over a short course for her  pleurisy.  Final Clinical Impressions(s) / ED Diagnoses   Final diagnoses:  Chest pain, unspecified type  Pleurisy    New Prescriptions Discharge Medication List as of 09/14/2016  8:14 PM    START taking these medications   Details  HYDROcodone-acetaminophen (NORCO/VICODIN) 5-325 MG tablet Take 1 tablet by mouth every 4 (four) hours as needed., Starting Tue 09/14/2016, Print    naproxen (NAPROSYN) 500 MG tablet Take 1 tablet (500 mg total) by mouth 2 (two) times daily., Starting Tue 09/14/2016, Print         Rolland Porter, MD 09/15/16 0100

## 2017-01-06 ENCOUNTER — Ambulatory Visit (INDEPENDENT_AMBULATORY_CARE_PROVIDER_SITE_OTHER): Payer: BLUE CROSS/BLUE SHIELD | Admitting: Neurology

## 2017-01-06 ENCOUNTER — Encounter: Payer: Self-pay | Admitting: Neurology

## 2017-01-06 VITALS — BP 106/64 | HR 72 | Resp 16 | Ht 64.0 in | Wt 213.0 lb

## 2017-01-06 DIAGNOSIS — M549 Dorsalgia, unspecified: Secondary | ICD-10-CM

## 2017-01-06 DIAGNOSIS — F3130 Bipolar disorder, current episode depressed, mild or moderate severity, unspecified: Secondary | ICD-10-CM | POA: Diagnosis not present

## 2017-01-06 DIAGNOSIS — G4700 Insomnia, unspecified: Secondary | ICD-10-CM

## 2017-01-06 DIAGNOSIS — M5412 Radiculopathy, cervical region: Secondary | ICD-10-CM

## 2017-01-06 DIAGNOSIS — M542 Cervicalgia: Secondary | ICD-10-CM | POA: Insufficient documentation

## 2017-01-06 MED ORDER — TIZANIDINE HCL 4 MG PO TABS: 4.0000 mg | ORAL_TABLET | Freq: Three times a day (TID) | ORAL | 3 refills | Status: DC | PRN

## 2017-01-06 NOTE — Patient Instructions (Signed)
Neck Exercises Neck exercises can be important for many reasons:  They can help you to improve and maintain flexibility in your neck. This can be especially important as you age.  They can help to make your neck stronger. This can make movement easier.  They can reduce or prevent neck pain.  They may help your upper back.  Ask your health care provider which neck exercises would be best for you. Exercises Neck Press Repeat this exercise 10 times. Do it first thing in the morning and right before bed or as told by your health care provider. 1. Lie on your back on a firm bed or on the floor with a pillow under your head. 2. Use your neck muscles to push your head down on the pillow and straighten your spine. 3. Hold the position as well as you can. Keep your head facing up and your chin tucked. 4. Slowly count to 5 while holding this position. 5. Relax for a few seconds. Then repeat.  Isometric Strengthening Do a full set of these exercises 2 times a day or as told by your health care provider. 1. Sit in a supportive chair and place your hand on your forehead. 2. Push forward with your head and neck while pushing back with your hand. Hold for 10 seconds. 3. Relax. Then repeat the exercise 3 times. 4. Next, do thesequence again, this time putting your hand against the back of your head. Use your head and neck to push backward against the hand pressure. 5. Finally, do the same exercise on either side of your head, pushing sideways against the pressure of your hand.  Prone Head Lifts Repeat this exercise 5 times. Do this 2 times a day or as told by your health care provider. 1. Lie face-down, resting on your elbows so that your chest and upper back are raised. 2. Start with your head facing downward, near your chest. Position your chin either on or near your chest. 3. Slowly lift your head upward. Lift until you are looking straight ahead. Then continue lifting your head as far back as  you can stretch. 4. Hold your head up for 5 seconds. Then slowly lower it to your starting position.  Supine Head Lifts Repeat this exercise 8-10 times. Do this 2 times a day or as told by your health care provider. 1. Lie on your back, bending your knees to point to the ceiling and keeping your feet flat on the floor. 2. Lift your head slowly off the floor, raising your chin toward your chest. 3. Hold for 5 seconds. 4. Relax and repeat.  Scapular Retraction Repeat this exercise 5 times. Do this 2 times a day or as told by your health care provider. 1. Stand with your arms at your sides. Look straight ahead. 2. Slowly pull both shoulders backward and downward until you feel a stretch between your shoulder blades in your upper back. 3. Hold for 10-30 seconds. 4. Relax and repeat.  Contact a health care provider if:  Your neck pain or discomfort gets much worse when you do an exercise.  Your neck pain or discomfort does not improve within 2 hours after you exercise. If you have any of these problems, stop exercising right away. Do not do the exercises again unless your health care provider says that you can. Get help right away if:  You develop sudden, severe neck pain. If this happens, stop exercising right away. Do not do the exercises again unless your   health care provider says that you can. Exercises Neck Stretch  Repeat this exercise 3-5 times. 1. Do this exercise while standing or while sitting in a chair. 2. Place your feet flat on the floor, shoulder-width apart. 3. Slowly turn your head to the right. Turn it all the way to the right so you can look over your right shoulder. Do not tilt or tip your head. 4. Hold this position for 10-30 seconds. 5. Slowly turn your head to the left, to look over your left shoulder. 6. Hold this position for 10-30 seconds.  Neck Retraction Repeat this exercise 8-10 times. Do this 3-4 times a day or as told by your health care  provider. 1. Do this exercise while standing or while sitting in a sturdy chair. 2. Look straight ahead. Do not bend your neck. 3. Use your fingers to push your chin backward. Do not bend your neck for this movement. Continue to face straight ahead. If you are doing the exercise properly, you will feel a slight sensation in your throat and a stretch at the back of your neck. 4. Hold the stretch for 1-2 seconds. Relax and repeat.  This information is not intended to replace advice given to you by your health care provider. Make sure you discuss any questions you have with your health care provider. Document Released: 02/24/2015 Document Revised: 08/21/2015 Document Reviewed: 09/23/2014 Elsevier Interactive Patient Education  2017 [employer]vier Inc.  

## 2017-01-06 NOTE — Progress Notes (Signed)
[facility]  PATIENT: Stacey Weaver DOB: 28-Jun-1975  REFERRING DOCTOR OR PCP:  Marguerita Merles, FNP SOURCE: patient, notes form [facility] urgent [facility], imaging and lab reports and images on PACS  _________________________________   HISTORICAL  CHIEF COMPLAINT:  Chief Complaint  Patient presents with  . Neck Pain    Stacey Weaver is here for eval of neck pain, bilat shoulder pain, upper back pain, onset yrs. ago, worse in the last 3 yrs. Some tingling/numbness bilat hands.No known injury. "It just goes out."  Toradol, Vicodin, heat help. Has not had imaging studies/fim  . Back Pain    HISTORY OF PRESENT ILLNESS:  I had the pleasure seeing you patient, Stacey Weaver, at [facility] for a consultation regarding her neck, shoulder and back pain.  She is a 41 year old woman who has had worsening neck pain the past 2 months.  Pain starts in the lower neck and then involves upper back and across to shoulders as it intensifies.     Randomly, her pain greatly intensifies, not always associated with activity.    The last time pain intensified (12/10/2016) she had just gone to the bathroom.   That was thte worse pain ever and radiated to the proximal right arm.   She denies weakness, numbness or change in bladder.   A thoracic spine x-ray 10/16/2014 was normal.    She has been on cyclobenzaprine and Robaxin without benefit.  Diclofenac and motrin did not help help.  Toradol helped short term.   Gabapentin caused hallucinations.    She has insomnia due to too many thoughts at night.    She sometimes wakes up and can't fall back asleep.  About 5 years ago, she had lower back pain and an xray showed DJD at L4L5.     A lumbar spine x-ray 03/28/2012 showed minimal L4-L5 disc space narrowing and mild retrolisthesis of L4.  CT head 01/09/2012 was normal.   REVIEW OF SYSTEMS: Constitutional: No fevers, chills, sweats, or change in appetite.   Insomnia Eyes:  No visual changes, double vision, eye pain Ear, nose and throat: No hearing loss, ear pain, nasal congestion, sore throat Cardiovascular: No chest pain, palpitations Respiratory: No shortness of breath at rest or with exertion.   No wheezes GastrointestinaI: No nausea, vomiting, diarrhea, abdominal pain, fecal incontinence Genitourinary: No dysuria, urinary retention or frequency.  No nocturia. Musculoskeletal: No neck pain, back pain Integumentary: No rash, pruritus, skin lesions Neurological: as above Psychiatric: No depression at this time.  No anxiety.  Shehas been diagnosed with Bipolar and is on lamotrigine and Tegretol and zoloft Endocrine: No palpitations, diaphoresis, change in appetite, change in weigh or increased thirst Hematologic/Lymphatic: No anemia, purpura, petechiae. Allergic/Immunologic: No itchy/runny eyes, nasal congestion, recent allergic reactions, rashes  ALLERGIES: Allergies  Allergen Reactions  . Almotriptan Malate Shortness Of Breath and Other (See Comments)    Muscle spasms  . Cymbalta [Duloxetine Hcl] Other (See Comments)    Other reaction(s): Other Muscle spasms suicidal thoughts  . Imitrex [Sumatriptan] Shortness Of Breath, Other (See Comments) and Palpitations    Muscle spasms  . Zomig Shortness Of Breath  . Geodon [Ziprasidone Hcl] Other (See Comments)    hallucinations  . Lactose Intolerance (Gi) Diarrhea and Other (See Comments)    Gas and bloated   . Penicillins Other (See Comments)    GI upset  Has patient had a PCN reaction causing immediate rash, facial/tongue/throat swelling, SOB or lightheadedness with hypotension: No Has patient had  a PCN reaction causing severe rash involving mucus membranes or skin necrosis: No Has patient had a PCN reaction that required hospitalization No Has patient had a PCN reaction occurring within the last 10 years: Yes If all of the above answers are "NO", then may proceed with Cephalosporin use.   .  Zyprexa [Olanzapine] Other (See Comments)    Hallucinations     HOME MEDICATIONS:  Current Outpatient Prescriptions:  .  ALPRAZolam (XANAX) 0.25 MG tablet, Take 0.25 mg by mouth at bedtime as needed for anxiety. , Disp: , Rfl:  .  carbamazepine (TEGRETOL) 200 MG tablet, Take 1 tablet (200 mg total) by mouth 2 (two) times daily., Disp: 60 tablet, Rfl: 0 .  lamoTRIgine (LAMICTAL) 25 MG tablet, Take 1 tablet (25 mg total) by mouth 2 (two) times daily., Disp: 60 tablet, Rfl: 0 .  sertraline (ZOLOFT) 50 MG tablet, Take 50 mg by mouth 2 (two) times daily., Disp: , Rfl:  .  traZODone (DESYREL) 100 MG tablet, Take 3 tablets by mouth at bedtime., Disp: , Rfl:   PAST MEDICAL HISTORY: Past Medical History:  Diagnosis Date  . Anxiety   . Asthma   . Bipolar 1 disorder (HCC)   . Migraine     PAST SURGICAL HISTORY: Past Surgical History:  Procedure Laterality Date  . KNEE SURGERY      FAMILY HISTORY: Family History  Problem Relation Age of Onset  . Hyperlipidemia Mother   . Hypertension Mother   . Heart disease Father 74       CAD  . Hyperlipidemia Maternal Grandmother   . Cancer Maternal Grandmother   . Cancer Maternal Grandfather   . Hyperlipidemia Brother     SOCIAL HISTORY:  Social History   Social History  . Marital status: Single    Spouse name: N/A  . Number of children: N/A  . Years of education: N/A   Occupational History  . Not on file.   Social History Main Topics  . Smoking status: Never Smoker  . Smokeless tobacco: Never Used  . Alcohol use No  . Drug use: No  . Sexual activity: Yes    Birth control/ protection: Implant   Other Topics Concern  . Not on file   Social History Narrative  . No narrative on file     PHYSICAL EXAM  Vitals:   01/06/17 1050  BP: 106/64  Pulse: 72  Resp: 16  Weight: 213 lb (96.6 kg)  Height:  (1.626 m)    Body mass index is 36.56 kg/m.   General: The patient is well-developed and well-nourished and in no  acute distress   Neck: The neck is supple, no carotid bruits are noted.  There is mild to moderate tenderness in the lower cervical paraspinal muscles and the upper thoracic paraspinal muscle/rhomboids. There is mild tenderness in the trapezius muscles. Range of motion is good in the shoulders. The bursae and joints of the shoulders are nontender.  Cardiovascular: The heart has a regular rate and rhythm with a normal S1 and S2. There were no murmurs, gallops or rubs. Lungs are clear to auscultation.  Skin: Extremities are without significant edema.  Musculoskeletal:  Back is nontender  Neurologic Exam  Mental status: The patient is alert and oriented x 3 at the time of the examination. The patient has apparent normal recent and remote memory, with an apparently normal attention span and concentration ability.   Speech is normal.  Cranial nerves: Extraocular movements are full. Pupils  are equal, round, and reactive to light and accomodation.  Visual fields are full.  Facial symmetry is present. There is good facial sensation to soft touch bilaterally.Facial strength is normal.  Trapezius and sternocleidomastoid strength is normal. No dysarthria is noted.  The tongue is midline, and the patient has symmetric elevation of the soft palate. No obvious hearing deficits are noted.  Motor:  Muscle bulk is normal.   Tone is normal. Strength is  5 / 5 in all 4 extremities.   Sensory: Sensory testing is intact to pinprick, soft touch and vibration sensation in all 4 extremities.  Coordination: Cerebellar testing reveals good finger-nose-finger and heel-to-shin bilaterally.  Gait and station: Station is normal.   Gait is normal. Tandem gait is normal. Romberg is negative.   Reflexes: Deep tendon reflexes are symmetric and normal bilaterally.   Plantar responses are flexor.    DIAGNOSTIC DATA (LABS, IMAGING, TESTING) - I reviewed patient records, labs, notes, testing and imaging myself where  available.  Lab Results  Component Value Date   WBC 6.3 09/14/2016   HGB 12.8 09/14/2016   HCT 39.7 09/14/2016   MCV 87.4 09/14/2016   PLT 260 09/14/2016      Component Value Date/Time   NA 135 09/14/2016 1340   K 3.9 09/14/2016 1340   CL 105 09/14/2016 1340   CO2 23 09/14/2016 1340   GLUCOSE 92 09/14/2016 1340   BUN 5 (L) 09/14/2016 1340   CREATININE 0.85 09/14/2016 1340   CREATININE 0.85 06/30/2015 1205   CALCIUM 9.3 09/14/2016 1340   PROT 8.1 10/09/2015 1401   ALBUMIN 4.1 10/09/2015 1401   AST 15 10/09/2015 1401   ALT 9 (L) 10/09/2015 1401   ALKPHOS 94 10/09/2015 1401   BILITOT 0.8 10/09/2015 1401   GFRNONAA >60 09/14/2016 1340   GFRAA >60 09/14/2016 1340   Lab Results  Component Value Date   CHOL 250 (H) 01/29/2016   HDL 65.40 01/29/2016   LDLCALC 167 (H) 01/29/2016   TRIG 87.0 01/29/2016   CHOLHDL 4 01/29/2016   No results found for: HGBA1C No results found for: VITAMINB12 Lab Results  Component Value Date   TSH 1.36 06/30/2015       ASSESSMENT AND PLAN  Bipolar I disorder, most recent episode depressed (HCC)  Neck pain  Upper back pain  Cervical radiculopathy  Insomnia, unspecified type    In summary, Stacey Weaver is a 41 year old woman with neck and upper back pain. On examination, she is tender over the cervical and thoracic paraspinal muscles and the rhomboids.    With her recent pain she had pain that radiated down the right arm consistent with a radiculopathy. She currently does not have numbness or weakness. We will check a cervical spine x-ray and I will have her start tizanidine as a muscle relaxant.  She was also given a handout with some neck exercises and advised to check on YouTube for other exercises or for demonstrations. If she is not any better when I see her back in 6 weeks, we will do a trigger point injection and consider a cervical spine MRI.  She will return to see me in 6 weeks. Call sooner if she has new or worsening  neurologic symptoms.  Thank you for asking me to see Stacey Weaver. Please let me know if I can be of further assistance with her or other patients in the future.    Morad Tal A. Epimenio Foot, MD, [facility] 01/06/2017, 11:19 AM Certified in Neurology, Clinical Neurophysiology, Sleep  Medicine, Pain Medicine and Neuroimaging  [facility] [address], [place], [place] [address] [phone]

## 2017-03-01 ENCOUNTER — Ambulatory Visit: Payer: BLUE CROSS/BLUE SHIELD | Admitting: Neurology

## 2017-04-28 ENCOUNTER — Emergency Department (HOSPITAL_BASED_OUTPATIENT_CLINIC_OR_DEPARTMENT_OTHER)
Admission: EM | Admit: 2017-04-28 | Discharge: 2017-04-28 | Disposition: A | Discharge status: Home / Self Care | Payer: BLUE CROSS/BLUE SHIELD | Attending: Emergency Medicine | Admitting: Emergency Medicine

## 2017-04-28 ENCOUNTER — Encounter (HOSPITAL_BASED_OUTPATIENT_CLINIC_OR_DEPARTMENT_OTHER): Payer: Self-pay

## 2017-04-28 ENCOUNTER — Other Ambulatory Visit: Payer: Self-pay

## 2017-04-28 DIAGNOSIS — J45909 Unspecified asthma, uncomplicated: Secondary | ICD-10-CM | POA: Insufficient documentation

## 2017-04-28 DIAGNOSIS — Z79899 Other long term (current) drug therapy: Secondary | ICD-10-CM | POA: Insufficient documentation

## 2017-04-28 DIAGNOSIS — G43809 Other migraine, not intractable, without status migrainosus: Secondary | ICD-10-CM | POA: Insufficient documentation

## 2017-04-28 DIAGNOSIS — E039 Hypothyroidism, unspecified: Secondary | ICD-10-CM | POA: Diagnosis not present

## 2017-04-28 DIAGNOSIS — G43909 Migraine, unspecified, not intractable, without status migrainosus: Secondary | ICD-10-CM | POA: Diagnosis present

## 2017-04-28 LAB — PREGNANCY, URINE: Preg Test, Ur: NEGATIVE

## 2017-04-28 MED ORDER — KETOROLAC TROMETHAMINE 30 MG/ML IJ SOLN
30.0000 mg | Freq: Once | INTRAMUSCULAR | Status: AC
  Administered 2017-04-28: 30 mg via INTRAVENOUS
  Filled 2017-04-28: qty 1

## 2017-04-28 MED ORDER — DIPHENHYDRAMINE HCL 50 MG/ML IJ SOLN
25.0000 mg | Freq: Once | INTRAMUSCULAR | Status: AC
  Administered 2017-04-28: 25 mg via INTRAVENOUS
  Filled 2017-04-28: qty 1

## 2017-04-28 MED ORDER — SODIUM CHLORIDE 0.9 % IV BOLUS (SEPSIS)
1000.0000 mL | Freq: Once | INTRAVENOUS | Status: AC
  Administered 2017-04-28: 1000 mL via INTRAVENOUS

## 2017-04-28 MED ORDER — DEXAMETHASONE SODIUM PHOSPHATE 10 MG/ML IJ SOLN
10.0000 mg | Freq: Once | INTRAMUSCULAR | Status: AC
  Administered 2017-04-28: 10 mg via INTRAVENOUS
  Filled 2017-04-28: qty 1

## 2017-04-28 MED ORDER — PROCHLORPERAZINE EDISYLATE 5 MG/ML IJ SOLN
10.0000 mg | Freq: Once | INTRAMUSCULAR | Status: AC
  Administered 2017-04-28: 10 mg via INTRAVENOUS
  Filled 2017-04-28: qty 2

## 2017-04-28 NOTE — ED Triage Notes (Signed)
C/o "migraine" x 4 days-NAD-steady gait

## 2017-04-28 NOTE — ED Provider Notes (Signed)
[facility] EMERGENCY DEPARTMENT Provider Note   CSN: [id number] Arrival date & time: 04/28/17  1103     History   Chief Complaint Chief Complaint  Patient presents with  . Migraine    HPI Stacey Weaver is a 42 y.o. female.  HPI   For 4 days has had headache, throbbing sharp pain, over left eye mostly but this morning had some neck pain as well.  Similar to past headaches except lasting longer, not getting better. [facility], hx of headaches, tried baclofen, motrin, toradol (not today) and tylenol without relief.   Nausea yesterday and today, no vomiting Headache started slowly, got worse, no fevers Worse with bright lights, loud sounds, movement    Past Medical History:  Diagnosis Date  . Anxiety   . Asthma   . Bipolar 1 disorder (HCC)   . Migraine     Patient Active Problem List   Diagnosis Date Noted  . Neck pain 01/06/2017  . Upper back pain 01/06/2017  . Cervical radiculopathy 01/06/2017  . Hyperlipidemia 01/29/2016  . Severe episode of recurrent major depressive disorder, without psychotic features (HCC)   . Insomnia   . MDD (major depressive disorder) 10/10/2015  . Bipolar I disorder, most recent episode depressed (HCC)   . Feeling suicidal 10/09/2015  . Rhabdomyolysis 08/08/2011  . Antihistamines overdose 08/08/2011  . SSRI overdose 08/07/2011  . Depression 08/07/2011  . Anxiety disorder 08/07/2011  . BMI 35.0-35.9,adult 08/07/2011  . Hypothyroidism 08/07/2011    Past Surgical History:  Procedure Laterality Date  . KNEE SURGERY      OB History    No data available       Home Medications    Prior to Admission medications   Medication Sig Start Date End Date Taking? Authorizing Provider  ALPRAZolam Prudy Feeler) 0.25 MG tablet Take 0.25 mg by mouth at bedtime as needed for anxiety.     [provider]  carbamazepine (TEGRETOL) 200 MG tablet Take 1 tablet (200 mg total) by mouth 2 (two) times daily.  10/14/15   Truman Hayward, FNP  lamoTRIgine (LAMICTAL) 25 MG tablet Take 1 tablet (25 mg total) by mouth 2 (two) times daily. 10/14/15   Truman Hayward, FNP  sertraline (ZOLOFT) 50 MG tablet Take 50 mg by mouth 2 (two) times daily.    [provider]  traZODone (DESYREL) 100 MG tablet Take 3 tablets by mouth at bedtime.    [provider]    Family History Family History  Problem Relation Age of Onset  . Hyperlipidemia Mother   . Hypertension Mother   . Heart disease Father 67       CAD  . Hyperlipidemia Maternal Grandmother   . Cancer Maternal Grandmother   . Cancer Maternal Grandfather   . Hyperlipidemia Brother     Social History Social History   Tobacco Use  . Smoking status: Never Smoker  . Smokeless tobacco: Never Used  Substance Use Topics  . Alcohol use: No  . Drug use: No     Allergies   Almotriptan malate; Cymbalta [duloxetine hcl]; Imitrex [sumatriptan]; Zomig; Geodon [ziprasidone hcl]; Lactose intolerance (gi); Penicillins; and Zyprexa [olanzapine]   Review of Systems Review of Systems  Constitutional: Negative for fever.  HENT: Negative for sore throat.   Eyes: Negative for visual disturbance.  Respiratory: Negative for cough and shortness of breath.   Cardiovascular: Negative for chest pain.  Gastrointestinal: Positive for nausea. Negative for abdominal pain and vomiting.  Genitourinary:  Negative for difficulty urinating.  Musculoskeletal: Negative for back pain and neck pain.  Skin: Negative for rash.  Neurological: Positive for headaches. Negative for syncope, facial asymmetry, speech difficulty, weakness and numbness.     Physical Exam Updated Vital Signs BP 125/82   Pulse 73   Temp 98.1 F (36.7 C) (Oral)   Resp 16   Ht 5\' 4"  (1.626 m)   Wt 96.5 kg (212 lb 11.9 oz)   SpO2 100%   BMI 36.52 kg/m   Physical Exam  Constitutional: She is oriented to person, place, and time. She appears well-developed and well-nourished.  No distress.  HENT:  Head: Normocephalic and atraumatic.  Eyes: Conjunctivae and EOM are normal.  Neck: Normal range of motion.  Cardiovascular: Normal rate, regular rhythm, normal heart sounds and intact distal pulses. Exam reveals no gallop and no friction rub.  No murmur heard. Pulmonary/Chest: Effort normal and breath sounds normal. No respiratory distress. She has no wheezes. She has no rales.  Abdominal: Soft. She exhibits no distension. There is no tenderness. There is no guarding.  Musculoskeletal: She exhibits no edema or tenderness.  Neurological: She is alert and oriented to person, place, and time. She has normal strength. No cranial nerve deficit or sensory deficit. Coordination and gait normal. GCS eye subscore is 4. GCS verbal subscore is 5. GCS motor subscore is 6.  Skin: Skin is warm and dry. No rash noted. She is not diaphoretic. No erythema.  Nursing note and vitals reviewed.    ED Treatments / Results  Labs (all labs ordered are listed, but only abnormal results are displayed) Labs Reviewed  PREGNANCY, URINE    EKG  EKG Interpretation None       Radiology No results found.  Procedures Procedures (including critical care time)  Medications Ordered in ED Medications  sodium chloride 0.9 % bolus 1,000 mL (0 mLs Intravenous Stopped 04/28/17 1419)  prochlorperazine (COMPAZINE) injection 10 mg (10 mg Intravenous Given 04/28/17 1257)  diphenhydrAMINE (BENADRYL) injection 25 mg (25 mg Intravenous Given 04/28/17 1257)  ketorolac (TORADOL) 30 MG/ML injection 30 mg (30 mg Intravenous Given 04/28/17 1416)  dexamethasone (DECADRON) injection 10 mg (10 mg Intravenous Given 04/28/17 1416)     Initial Impression / Assessment and Plan / ED Course  I have reviewed the triage vital signs and the nursing notes.  Pertinent labs & imaging results that were available during my care of the patient were reviewed by me and considered in my medical decision making (see chart for  details).     42yo female with history of headaches, bipolar, presents with concern for headache.  Headache began slowly, no trauma, no fevers, and normal neurologic exam and have low suspicion for [facility], SDH or meningitis.  Patient was given compazine and benadryl with improvement in headache. Given decadron, toradol. Suspect migraine. Patient discharged in stable condition with understanding of reasons to return.    Final Clinical Impressions(s) / ED Diagnoses   Final diagnoses:  Other migraine without status migrainosus, not intractable    ED Discharge Orders    None       Alvira MondaySchlossman, Takia Runyon, MD 04/28/17 2053

## 2017-09-24 ENCOUNTER — Emergency Department (HOSPITAL_BASED_OUTPATIENT_CLINIC_OR_DEPARTMENT_OTHER)
Admission: EM | Admit: 2017-09-24 | Discharge: 2017-09-24 | Disposition: A | Discharge status: Home / Self Care | Payer: BLUE CROSS/BLUE SHIELD | Attending: Emergency Medicine | Admitting: Emergency Medicine

## 2017-09-24 ENCOUNTER — Encounter (HOSPITAL_BASED_OUTPATIENT_CLINIC_OR_DEPARTMENT_OTHER): Payer: Self-pay | Admitting: Emergency Medicine

## 2017-09-24 ENCOUNTER — Emergency Department (HOSPITAL_BASED_OUTPATIENT_CLINIC_OR_DEPARTMENT_OTHER): Payer: BLUE CROSS/BLUE SHIELD

## 2017-09-24 ENCOUNTER — Other Ambulatory Visit: Payer: Self-pay

## 2017-09-24 DIAGNOSIS — Z79899 Other long term (current) drug therapy: Secondary | ICD-10-CM | POA: Insufficient documentation

## 2017-09-24 DIAGNOSIS — R197 Diarrhea, unspecified: Secondary | ICD-10-CM | POA: Insufficient documentation

## 2017-09-24 DIAGNOSIS — J45909 Unspecified asthma, uncomplicated: Secondary | ICD-10-CM | POA: Diagnosis not present

## 2017-09-24 DIAGNOSIS — E039 Hypothyroidism, unspecified: Secondary | ICD-10-CM | POA: Insufficient documentation

## 2017-09-24 DIAGNOSIS — R51 Headache: Secondary | ICD-10-CM | POA: Diagnosis not present

## 2017-09-24 DIAGNOSIS — R519 Headache, unspecified: Secondary | ICD-10-CM

## 2017-09-24 DIAGNOSIS — Z3202 Encounter for pregnancy test, result negative: Secondary | ICD-10-CM | POA: Insufficient documentation

## 2017-09-24 DIAGNOSIS — N3000 Acute cystitis without hematuria: Secondary | ICD-10-CM | POA: Diagnosis not present

## 2017-09-24 DIAGNOSIS — J181 Lobar pneumonia, unspecified organism: Secondary | ICD-10-CM | POA: Insufficient documentation

## 2017-09-24 DIAGNOSIS — J189 Pneumonia, unspecified organism: Secondary | ICD-10-CM

## 2017-09-24 LAB — URINALYSIS, ROUTINE W REFLEX MICROSCOPIC
BILIRUBIN URINE: NEGATIVE
Glucose, UA: NEGATIVE mg/dL
Hgb urine dipstick: NEGATIVE
Ketones, ur: NEGATIVE mg/dL
NITRITE: NEGATIVE
PROTEIN: NEGATIVE mg/dL
SPECIFIC GRAVITY, URINE: 1.01 (ref 1.005–1.030)
pH: 6 (ref 5.0–8.0)

## 2017-09-24 LAB — COMPREHENSIVE METABOLIC PANEL
ALK PHOS: 103 U/L (ref 38–126)
ALT: 11 U/L (ref 0–44)
AST: 21 U/L (ref 15–41)
Albumin: 3.9 g/dL (ref 3.5–5.0)
Anion gap: 9 (ref 5–15)
BILIRUBIN TOTAL: 0.2 mg/dL — AB (ref 0.3–1.2)
BUN: 8 mg/dL (ref 6–20)
CALCIUM: 9.6 mg/dL (ref 8.9–10.3)
CHLORIDE: 105 mmol/L (ref 98–111)
CO2: 22 mmol/L (ref 22–32)
Creatinine, Ser: 0.88 mg/dL (ref 0.44–1.00)
GFR calc Af Amer: >60 mL/min (ref >60)
Glucose, Bld: 114 mg/dL — ABNORMAL HIGH (ref 70–99)
Potassium: 4.1 mmol/L (ref 3.5–5.1)
Sodium: 136 mmol/L (ref 135–145)
Total Protein: 8.8 g/dL — ABNORMAL HIGH (ref 6.5–8.1)

## 2017-09-24 LAB — CBC WITH DIFFERENTIAL/PLATELET
BASOS ABS: 0 10*3/uL (ref 0.0–0.1)
Basophils Relative: 0 %
Eosinophils Absolute: 0.2 10*3/uL (ref 0.0–0.7)
Eosinophils Relative: 1 %
HEMATOCRIT: 41.4 % (ref 36.0–46.0)
HEMOGLOBIN: 13.6 g/dL (ref 12.0–15.0)
LYMPHS PCT: 13 %
Lymphs Abs: 1.5 10*3/uL (ref 0.7–4.0)
MCH: 28.4 pg (ref 26.0–34.0)
MCHC: 32.9 g/dL (ref 30.0–36.0)
MCV: 86.4 fL (ref 78.0–100.0)
Monocytes Absolute: 1 10*3/uL (ref 0.1–1.0)
Monocytes Relative: 9 %
NEUTROS ABS: 9.2 10*3/uL — AB (ref 1.7–7.7)
Neutrophils Relative %: 77 %
Platelets: 288 10*3/uL (ref 150–400)
RBC: 4.79 MIL/uL (ref 3.87–5.11)
RDW: 12.3 % (ref 11.5–15.5)
WBC: 11.9 10*3/uL — AB (ref 4.0–10.5)

## 2017-09-24 LAB — URINALYSIS, MICROSCOPIC (REFLEX)

## 2017-09-24 LAB — LIPASE, BLOOD: LIPASE: 25 U/L (ref 11–51)

## 2017-09-24 LAB — PREGNANCY, URINE: PREG TEST UR: NEGATIVE

## 2017-09-24 MED ORDER — PROCHLORPERAZINE MALEATE 10 MG PO TABS: 10.0000 mg | ORAL_TABLET | Freq: Two times a day (BID) | ORAL | 0 refills | Status: DC | PRN

## 2017-09-24 MED ORDER — DIPHENHYDRAMINE HCL 50 MG/ML IJ SOLN
25.0000 mg | Freq: Once | INTRAMUSCULAR | Status: AC
Start: 2017-09-24 — End: 2017-09-24
  Administered 2017-09-24: 25 mg via INTRAVENOUS
  Filled 2017-09-24: qty 1

## 2017-09-24 MED ORDER — KETOROLAC TROMETHAMINE 30 MG/ML IJ SOLN
30.0000 mg | Freq: Once | INTRAMUSCULAR | Status: AC
  Administered 2017-09-24: 30 mg via INTRAVENOUS
  Filled 2017-09-24: qty 1

## 2017-09-24 MED ORDER — LEVOFLOXACIN 750 MG PO TABS: 750.0000 mg | ORAL_TABLET | Freq: Every day | ORAL | 0 refills | Status: AC

## 2017-09-24 MED ORDER — SODIUM CHLORIDE 0.9 % IV BOLUS
1000.0000 mL | Freq: Once | INTRAVENOUS | Status: AC
  Administered 2017-09-24: 1000 mL via INTRAVENOUS

## 2017-09-24 MED ORDER — PROCHLORPERAZINE EDISYLATE 10 MG/2ML IJ SOLN
10.0000 mg | Freq: Once | INTRAMUSCULAR | Status: AC
  Administered 2017-09-24: 10 mg via INTRAVENOUS
  Filled 2017-09-24: qty 2

## 2017-09-24 NOTE — ED Provider Notes (Signed)
[facility] EMERGENCY DEPARTMENT Provider Note   CSN: [id number] Arrival date & time: 09/24/17  0740     History   Chief Complaint Chief Complaint  Patient presents with  . Headache    HPI Stacey Weaver is a 42 y.o. female.  The history is provided by the patient and medical records.  Headache   This is a recurrent problem. The current episode started more than 2 days ago. The problem occurs constantly. The problem has not changed since onset.The headache is associated with bright light and loud noise. The quality of the pain is described as dull. The pain is severe. The pain does not radiate. Associated symptoms include nausea. Pertinent negatives include no anorexia, no fever, no chest pressure, no near-syncope, no palpitations, no syncope, no shortness of breath and no vomiting. She has tried nothing (solumedrol) for the symptoms. The treatment provided no relief.    Past Medical History:  Diagnosis Date  . Anxiety   . Asthma   . Bipolar 1 disorder (HCC)   . Migraine     Patient Active Problem List   Diagnosis Date Noted  . Neck pain 01/06/2017  . Upper back pain 01/06/2017  . Cervical radiculopathy 01/06/2017  . Hyperlipidemia 01/29/2016  . Severe episode of recurrent major depressive disorder, without psychotic features (HCC)   . Insomnia   . MDD (major depressive disorder) 10/10/2015  . Bipolar I disorder, most recent episode depressed (HCC)   . Feeling suicidal 10/09/2015  . Rhabdomyolysis 08/08/2011  . Antihistamines overdose 08/08/2011  . SSRI overdose 08/07/2011  . Depression 08/07/2011  . Anxiety disorder 08/07/2011  . BMI 35.0-35.9,adult 08/07/2011  . Hypothyroidism 08/07/2011    Past Surgical History:  Procedure Laterality Date  . KNEE SURGERY       OB History   None      Home Medications    Prior to Admission medications   Medication Sig Start Date End Date Taking? Authorizing Provider  ALPRAZolam Prudy Feeler) 0.25 MG tablet  Take 0.25 mg by mouth at bedtime as needed for anxiety.     [provider]  carbamazepine (TEGRETOL) 200 MG tablet Take 1 tablet (200 mg total) by mouth 2 (two) times daily. 10/14/15   Truman Hayward, FNP  lamoTRIgine (LAMICTAL) 25 MG tablet Take 1 tablet (25 mg total) by mouth 2 (two) times daily. 10/14/15   Truman Hayward, FNP  sertraline (ZOLOFT) 50 MG tablet Take 50 mg by mouth 2 (two) times daily.    [provider]  traZODone (DESYREL) 100 MG tablet Take 3 tablets by mouth at bedtime.    [provider]    Family History Family History  Problem Relation Age of Onset  . Hyperlipidemia Mother   . Hypertension Mother   . Heart disease Father 86       CAD  . Hyperlipidemia Maternal Grandmother   . Cancer Maternal Grandmother   . Cancer Maternal Grandfather   . Hyperlipidemia Brother     Social History Social History   Tobacco Use  . Smoking status: Never Smoker  . Smokeless tobacco: Never Used  Substance Use Topics  . Alcohol use: No  . Drug use: No     Allergies   Almotriptan malate; Cymbalta [duloxetine hcl]; Imitrex [sumatriptan]; Zomig; Geodon [ziprasidone hcl]; Lactose intolerance (gi); Penicillins; and Zyprexa [olanzapine]   Review of Systems Review of Systems  Constitutional: Negative for chills, diaphoresis, fatigue and fever.  HENT: Negative for congestion and rhinorrhea.  Eyes: Positive for photophobia. Negative for visual disturbance.  Respiratory: Positive for cough. Negative for chest tightness, shortness of breath and wheezing.   Cardiovascular: Negative for chest pain, palpitations, leg swelling, syncope and near-syncope.  Gastrointestinal: Positive for diarrhea and nausea. Negative for abdominal pain, anorexia, constipation and vomiting.  Genitourinary: Positive for frequency. Negative for dysuria and flank pain.  Musculoskeletal: Negative for back pain, neck pain and neck stiffness.  Neurological: Positive for headaches.  Negative for dizziness, seizures, facial asymmetry, speech difficulty, weakness, light-headedness and numbness.  Psychiatric/Behavioral: Negative for agitation.  All other systems reviewed and are negative.    Physical Exam Updated Vital Signs BP 136/71 (BP Location: Left Arm)   Pulse (!) 144   Temp 99.6 F (37.6 C) (Oral)   Resp (!) 24   Ht 5\' 4"  (1.626 m)   Wt 95.3 kg (210 lb)   SpO2 98%   BMI 36.05 kg/m   Physical Exam  Constitutional: She is oriented to person, place, and time. She appears well-developed and well-nourished.  Non-toxic appearance. She does not appear ill. No distress.  HENT:  Head: Normocephalic and atraumatic.  Nose: Nose normal.  Mouth/Throat: Oropharynx is clear and moist. No oropharyngeal exudate.  Eyes: Pupils are equal, round, and reactive to light. Conjunctivae and EOM are normal.  Neck: Normal range of motion. Neck supple.  Cardiovascular: Regular rhythm and intact distal pulses. Tachycardia present.  No murmur heard. Pulmonary/Chest: Effort normal and breath sounds normal. No respiratory distress. She has no wheezes. She has no rales. She exhibits no tenderness.  Abdominal: Soft. There is no tenderness. There is no guarding.  Musculoskeletal: She exhibits no edema or tenderness.  Lymphadenopathy:    She has no cervical adenopathy.  Neurological: She is alert and oriented to person, place, and time. No cranial nerve deficit or sensory deficit. She exhibits normal muscle tone. Coordination normal.  Skin: Skin is warm and dry. Capillary refill takes less than 2 seconds. No rash noted. She is not diaphoretic. No erythema.  Psychiatric: She has a normal mood and affect.  Nursing note and vitals reviewed.    ED Treatments / Results  Labs (all labs ordered are listed, but only abnormal results are displayed) Labs Reviewed  CBC WITH DIFFERENTIAL/PLATELET - Abnormal; Notable for the following components:      Result Value   WBC 11.9 (*)    Neutro  Abs 9.2 (*)    All other components within normal limits  COMPREHENSIVE METABOLIC PANEL - Abnormal; Notable for the following components:   Glucose, Bld 114 (*)    Total Protein 8.8 (*)    Total Bilirubin 0.2 (*)    All other components within normal limits  URINALYSIS, ROUTINE W REFLEX MICROSCOPIC - Abnormal; Notable for the following components:   Leukocytes, UA TRACE (*)    All other components within normal limits  URINALYSIS, MICROSCOPIC (REFLEX) - Abnormal; Notable for the following components:   Bacteria, UA MANY (*)    All other components within normal limits  URINE CULTURE  LIPASE, BLOOD  PREGNANCY, URINE    EKG None  Radiology Dg Chest 2 View  Result Date: 09/24/2017 CLINICAL DATA:  Bronchitis for the past week.  Cough. EXAM: CHEST - 2 VIEW COMPARISON:  09/14/2016. FINDINGS: Normal sized heart. Patchy opacity in the lingula and left lower lobe. Clear right lung. Mild peribronchial thickening. Mild thoracic spine degenerative changes. IMPRESSION: 1. Lingular and left lower lobe pneumonia. 2. Mild bronchitic changes. Electronically Signed   By: Viviann Spare  Azucena Kubaeid M.D.   On: 09/24/2017 09:01    Procedures Procedures (including critical care time)  Medications Ordered in ED Medications  prochlorperazine (COMPAZINE) injection 10 mg (10 mg Intravenous Given 09/24/17 0823)  diphenhydrAMINE (BENADRYL) injection 25 mg (25 mg Intravenous Given 09/24/17 0823)  sodium chloride 0.9 % bolus 1,000 mL (0 mLs Intravenous Stopped 09/24/17 0951)  ketorolac (TORADOL) 30 MG/ML injection 30 mg (30 mg Intravenous Given 09/24/17 0825)     Initial Impression / Assessment and Plan / ED Course  I have reviewed the triage vital signs and the nursing notes.  Pertinent labs & imaging results that were available during my care of the patient were reviewed by me and considered in my medical decision making (see chart for details).     Stacey Weaver is a 42 y.o. female with a past medical history  significant for bipolar disorder, asthma, and migraines who presents with 4 days of diarrhea, 4 days of headache, and 1 week of worsening cough.  Patient reports that for the last week she has had a cough that has become productive.  She reports coughing up phlegm-like sputum.  She reports that she has a history of strong migraines and reports that her headache has been worsening.  She denies neck pain or neck stiffness.  She reports that she was seen by her PCP yesterday for headache and was given Toradol and Solu-Medrol and she reports her headache is continued.  She denies numbness, tingling, or weakness of extremities.  She denies any vision changes.  She denies any dysuria but reports urinary frequency. Pt reports continued diarrhea.  Patient denies shortness of breath or chest pain.  On exam, patient has mild coarse breath sounds in the bases.  No wheezing.  Chest was nontender.  No murmur appreciated.  No focal neurologic deficit seen.  Normal neck range of motion.  Patient was tachycardic on arrival.  Patient will have x-ray to look for pneumonia given her productive cough with her tachycardia and tachypnea.  Patient will have electrolytes and labs to look for abnormalities with her diarrhea.  Patient given headache cocktail.  Have low suspicion for meningitis given the normal neck range of motion with no stiffness and no neurologic deficits.    Patient's headache significantly improved after headache cocktail.  Chest x-ray revealed pneumonia.  Mild leukocytosis was seen which may be due to the pneumonia or the recent steroid use yesterday.  Patient also found to have urinary tract infection with her urinary frequency.  Patient will be given levofloxacin for the pneumonia and UTI.  Given patient's maintaining oxygen saturations on room air, do not feel patient needs admission.  Patient had improvement in heart rate with fluids.  Patient is feeling much better.  Patient will follow-up with her PCP  in several days and understood return precautions.  Patient had no other questions or concerns and was discharged in good condition.   Final Clinical Impressions(s) / ED Diagnoses   Final diagnoses:  Acute nonintractable headache, unspecified headache type  Community acquired pneumonia of left lower lobe of lung (HCC)  Acute cystitis without hematuria  Diarrhea, unspecified type     Clinical Impression: 1. Acute nonintractable headache, unspecified headache type   2. Community acquired pneumonia of left lower lobe of lung (HCC)   3. Acute cystitis without hematuria   4. Diarrhea, unspecified type     Disposition: Admit  This note was prepared with assistance of Dragon voice recognition software. Occasional wrong-word or sound-a-like substitutions  may have occurred due to the inherent limitations of voice recognition software.     Tegeler, Canary Brim, MD 09/24/17 1750

## 2017-09-24 NOTE — ED Notes (Signed)
ED Provider at bedside. 

## 2017-09-24 NOTE — ED Notes (Signed)
Patient transported to X-ray 

## 2017-09-24 NOTE — Discharge Instructions (Signed)
Your work-up today showed evidence of pneumonia and urinary tract infection.  We suspect this contributed to your headaches and malaise.  Please take the antibiotics to treat your infections and use the Compazine to help with the nausea and headache.  Please follow-up with your primary doctor in several days.  If any symptoms change or worsen, please return to the nearest emergency department.

## 2017-09-24 NOTE — ED Triage Notes (Signed)
Pt reports headache x 4 days. Hx of migraines. Normal meds are not working. Also seen by PCP yesterday and given Toradol and solumedrol without relief. Pt also reports 4 episodes of diarrhea today.

## 2017-09-25 LAB — URINE CULTURE

## 2017-11-21 ENCOUNTER — Other Ambulatory Visit: Payer: Self-pay

## 2017-11-21 ENCOUNTER — Emergency Department (HOSPITAL_BASED_OUTPATIENT_CLINIC_OR_DEPARTMENT_OTHER)
Admission: EM | Admit: 2017-11-21 | Discharge: 2017-11-21 | Disposition: A | Discharge status: Home / Self Care | Payer: BLUE CROSS/BLUE SHIELD | Attending: Emergency Medicine | Admitting: Emergency Medicine

## 2017-11-21 ENCOUNTER — Encounter (HOSPITAL_BASED_OUTPATIENT_CLINIC_OR_DEPARTMENT_OTHER): Payer: Self-pay | Admitting: *Deleted

## 2017-11-21 ENCOUNTER — Emergency Department (HOSPITAL_BASED_OUTPATIENT_CLINIC_OR_DEPARTMENT_OTHER): Payer: BLUE CROSS/BLUE SHIELD

## 2017-11-21 DIAGNOSIS — Z79899 Other long term (current) drug therapy: Secondary | ICD-10-CM | POA: Insufficient documentation

## 2017-11-21 DIAGNOSIS — J45909 Unspecified asthma, uncomplicated: Secondary | ICD-10-CM | POA: Diagnosis not present

## 2017-11-21 DIAGNOSIS — E039 Hypothyroidism, unspecified: Secondary | ICD-10-CM | POA: Insufficient documentation

## 2017-11-21 DIAGNOSIS — R109 Unspecified abdominal pain: Secondary | ICD-10-CM | POA: Diagnosis present

## 2017-11-21 LAB — BASIC METABOLIC PANEL
Anion gap: 10 (ref 5–15)
BUN: 11 mg/dL (ref 6–20)
CALCIUM: 9.3 mg/dL (ref 8.9–10.3)
CHLORIDE: 104 mmol/L (ref 98–111)
CO2: 24 mmol/L (ref 22–32)
CREATININE: 0.89 mg/dL (ref 0.44–1.00)
GFR calc non Af Amer: >60 mL/min (ref >60)
Glucose, Bld: 95 mg/dL (ref 70–99)
Potassium: 3.7 mmol/L (ref 3.5–5.1)
Sodium: 138 mmol/L (ref 135–145)

## 2017-11-21 LAB — CBC
HCT: 44.1 % (ref 36.0–46.0)
Hemoglobin: 14.1 g/dL (ref 12.0–15.0)
MCH: 28.1 pg (ref 26.0–34.0)
MCHC: 32 g/dL (ref 30.0–36.0)
MCV: 88 fL (ref 78.0–100.0)
PLATELETS: 253 10*3/uL (ref 150–400)
RBC: 5.01 MIL/uL (ref 3.87–5.11)
RDW: 12.7 % (ref 11.5–15.5)
WBC: 6.9 10*3/uL (ref 4.0–10.5)

## 2017-11-21 LAB — URINALYSIS, ROUTINE W REFLEX MICROSCOPIC
Bilirubin Urine: NEGATIVE
GLUCOSE, UA: NEGATIVE mg/dL
Hgb urine dipstick: NEGATIVE
KETONES UR: NEGATIVE mg/dL
LEUKOCYTES UA: NEGATIVE
NITRITE: NEGATIVE
PH: 6.5 (ref 5.0–8.0)
Protein, ur: NEGATIVE mg/dL
Specific Gravity, Urine: 1.015 (ref 1.005–1.030)

## 2017-11-21 LAB — PREGNANCY, URINE: Preg Test, Ur: NEGATIVE

## 2017-11-21 MED ORDER — ONDANSETRON HCL 4 MG/2ML IJ SOLN
4.0000 mg | Freq: Once | INTRAMUSCULAR | Status: AC
  Administered 2017-11-21: 4 mg via INTRAVENOUS

## 2017-11-21 MED ORDER — METOCLOPRAMIDE HCL 10 MG PO TABS: 10.0000 mg | ORAL_TABLET | Freq: Four times a day (QID) | ORAL | 0 refills | Status: DC

## 2017-11-21 MED ORDER — METOCLOPRAMIDE HCL 5 MG/ML IJ SOLN
10.0000 mg | Freq: Once | INTRAMUSCULAR | Status: AC
  Administered 2017-11-21: 10 mg via INTRAVENOUS
  Filled 2017-11-21: qty 2

## 2017-11-21 MED ORDER — MORPHINE SULFATE (PF) 4 MG/ML IV SOLN
4.0000 mg | Freq: Once | INTRAVENOUS | Status: AC
  Administered 2017-11-21: 4 mg via INTRAVENOUS
  Filled 2017-11-21: qty 1

## 2017-11-21 MED ORDER — SODIUM CHLORIDE 0.9 % IV BOLUS
1000.0000 mL | Freq: Once | INTRAVENOUS | Status: AC
  Administered 2017-11-21: 1000 mL via INTRAVENOUS

## 2017-11-21 MED ORDER — ONDANSETRON HCL 4 MG/2ML IJ SOLN
INTRAMUSCULAR | Status: AC
  Administered 2017-11-21: 4 mg via INTRAVENOUS
  Filled 2017-11-21: qty 2

## 2017-11-21 MED ORDER — KETOROLAC TROMETHAMINE 30 MG/ML IJ SOLN
30.0000 mg | Freq: Once | INTRAMUSCULAR | Status: AC
  Administered 2017-11-21: 30 mg via INTRAVENOUS
  Filled 2017-11-21: qty 1

## 2017-11-21 NOTE — ED Notes (Signed)
Pt drinking ginger ale for the fluid chanllenge

## 2017-11-21 NOTE — Discharge Instructions (Addendum)
Thank you for allowing me to care for you today in the Emergency Department.   Your work-up today was reassuring. You have been seen in the Emergency Department (ED) for abdominal and flank pain.  Your evaluation did not identify a clear cause of your symptoms but was generally reassuring.  Let 1 tablet of Zofran dissolve in your tongue every 8 hours as needed for nausea or vomiting.  It is important to stay hydrated if you continue to have vomiting.  Take Tylenol or ibuprofen at home for pain as directed on the label.  Please follow up with your primary care provider regarding todays emergent visit and the symptoms that are bothering you.  Return to the ED if your abdominal pain worsens or fails to improve, you develop bloody vomiting, bloody diarrhea, you are unable to tolerate fluids due to vomiting, fever greater than 101, or other symptoms that concern you.

## 2017-11-21 NOTE — ED Notes (Signed)
Pt on monitor 

## 2017-11-21 NOTE — ED Triage Notes (Signed)
Pt reports right flank pain x last week, waxing and waning, today is 10/10, pt is moaning and crying during triage. Pt denies any radiation of the pain, vomiting at triage.

## 2017-11-21 NOTE — ED Provider Notes (Signed)
[facility] EMERGENCY DEPARTMENT Provider Note   CSN: [id number] Arrival date & time: 11/21/17  1107     History   Chief Complaint Chief Complaint  Patient presents with  . Flank Pain    HPI Stacey Weaver is a 42 y.o. female with a history of rhabdomyolysis, hyperlipidemia, bipolar 1 disorder, and asthma who presents to the emergency department with a chief complaint of right flank pain.    She reports sudden onset right flank pain that began 4 days ago.  Pain has been constant, but waxing and waning.  She reports it significantly worsened this morning and is currently 10 out of 10.  Pain has begun to radiate to the right lower quadrant as it is progressively worsened.  She characterizes the pain as stabbing.  Pain is improved she is curled up in the fetal position and worse when she lays flat on her back.  She reports that she gave a urine sample at work 4 days ago, which showed some blood and white blood cells.  The nurse practitioner at work started her on a course of levofloxacin for UTI.  She reports associated chills, urinary hesitancy and frequency.  She began to have nausea and nonbloody, nonbilious vomiting this morning.  She denies vaginal itching or bleeding, constipation, or diarrhea.  No history of nephrolithiasis.   The history is provided by the patient. No language interpreter was used.    Past Medical History:  Diagnosis Date  . Anxiety   . Asthma   . Bipolar 1 disorder (HCC)   . Migraine     Patient Active Problem List   Diagnosis Date Noted  . Neck pain 01/06/2017  . Upper back pain 01/06/2017  . Cervical radiculopathy 01/06/2017  . Hyperlipidemia 01/29/2016  . Severe episode of recurrent major depressive disorder, without psychotic features (HCC)   . Insomnia   . MDD (major depressive disorder) 10/10/2015  . Bipolar I disorder, most recent episode depressed (HCC)   . Feeling suicidal 10/09/2015  . Rhabdomyolysis 08/08/2011  .  Antihistamines overdose 08/08/2011  . SSRI overdose 08/07/2011  . Depression 08/07/2011  . Anxiety disorder 08/07/2011  . BMI 35.0-35.9,adult 08/07/2011  . Hypothyroidism 08/07/2011    Past Surgical History:  Procedure Laterality Date  . KNEE SURGERY       OB History   None      Home Medications    Prior to Admission medications   Medication Sig Start Date End Date Taking? Authorizing Provider  temazepam (RESTORIL) 30 MG capsule Take 30 mg by mouth at bedtime as needed for sleep.   Yes [provider]  ALPRAZolam (XANAX) 0.25 MG tablet Take 0.25 mg by mouth at bedtime as needed for anxiety.     [provider]  carbamazepine (TEGRETOL) 200 MG tablet Take 1 tablet (200 mg total) by mouth 2 (two) times daily. 10/14/15   Truman Hayward, FNP  lamoTRIgine (LAMICTAL) 25 MG tablet Take 1 tablet (25 mg total) by mouth 2 (two) times daily. 10/14/15   Truman Hayward, FNP  metoCLOPramide (REGLAN) 10 MG tablet Take 1 tablet (10 mg total) by mouth every 6 (six) hours. 11/21/17   Katie Faraone A, PA-C  prochlorperazine (COMPAZINE) 10 MG tablet Take 1 tablet (10 mg total) by mouth 2 (two) times daily as needed for nausea or vomiting. 09/24/17   Tegeler, Canary Brim, MD  sertraline (ZOLOFT) 50 MG tablet Take 50 mg by mouth 2 (two) times daily.    [provider]  traZODone (DESYREL) 100 MG tablet Take 3 tablets by mouth at bedtime.    [provider]    Family History Family History  Problem Relation Age of Onset  . Hyperlipidemia Mother   . Hypertension Mother   . Heart disease Father 7       CAD  . Hyperlipidemia Maternal Grandmother   . Cancer Maternal Grandmother   . Cancer Maternal Grandfather   . Hyperlipidemia Brother     Social History Social History   Tobacco Use  . Smoking status: Never Smoker  . Smokeless tobacco: Never Used  Substance Use Topics  . Alcohol use: No  . Drug use: No     Allergies   Almotriptan malate; Cymbalta  [duloxetine hcl]; Imitrex [sumatriptan]; Zomig; Geodon [ziprasidone hcl]; Lactose intolerance (gi); Penicillins; and Zyprexa [olanzapine]   Review of Systems Review of Systems  Constitutional: Positive for chills. Negative for activity change and fever.  Respiratory: Negative for shortness of breath.   Cardiovascular: Negative for chest pain.  Gastrointestinal: Positive for abdominal pain, nausea and vomiting. Negative for constipation and diarrhea.  Genitourinary: Positive for frequency and urgency. Negative for dysuria, vaginal bleeding, vaginal discharge and vaginal pain.  Musculoskeletal: Negative for back pain.  Skin: Negative for rash.  Allergic/Immunologic: Negative for immunocompromised state.  Neurological: Negative for headaches.  Psychiatric/Behavioral: Negative for confusion.   Physical Exam Updated Vital Signs BP 105/77   Pulse 69   Temp 98.2 F (36.8 C) (Oral)   Resp 17   Ht 5\' 4"  (1.626 m)   Wt 95.3 kg   SpO2 100%   BMI 36.05 kg/m   Physical Exam  Constitutional: No distress.  HENT:  Head: Normocephalic.  Eyes: Conjunctivae are normal.  Neck: Neck supple.  Cardiovascular: Normal rate, regular rhythm, normal heart sounds and intact distal pulses. Exam reveals no gallop and no friction rub.  No murmur heard. Pulmonary/Chest: Effort normal. No stridor. No respiratory distress. She has no wheezes. She has no rales. She exhibits no tenderness.  Abdominal: Soft. She exhibits no distension and no mass. There is tenderness. There is no rebound and no guarding. No hernia.  Exquisitely tender with minimal palpation to the right CVA.  No left CVA tenderness.  Moderate right lower quadrant tenderness without rebound or guarding.  No tenderness over McBurney's point.  Abdomen is obese, but no distention and is soft.  Negative Murphy sign.  No right upper quadrant tenderness and the remainder of the abdomen is nontender.  Neurological: She is alert.  Skin: Skin is warm. No  rash noted.  Psychiatric: Her behavior is normal.  Nursing note and vitals reviewed.    ED Treatments / Results  Labs (all labs ordered are listed, but only abnormal results are displayed) Labs Reviewed  URINALYSIS, ROUTINE W REFLEX MICROSCOPIC  PREGNANCY, URINE  BASIC METABOLIC PANEL  CBC    EKG None  Radiology Ct Renal Stone Study  Result Date: 11/21/2017 CLINICAL DATA:  Right flank pain hematuria. EXAM: CT ABDOMEN AND PELVIS WITHOUT CONTRAST TECHNIQUE: Multidetector CT imaging of the abdomen and pelvis was performed following the standard protocol without IV contrast. COMPARISON:  None. FINDINGS: Lower chest: Mild atelectasis in the lung bases. No infiltrate or effusion Hepatobiliary: Normal liver, gallbladder, bile ducts. Pancreas: Negative Spleen: Negative Adrenals/Urinary Tract: Kidneys ureters and bladder normal bilaterally. No hydronephrosis or renal stone. No renal mass lesion. Stomach/Bowel: Negative for bowel obstruction. Small hiatal hernia. No bowel mass or edema. Normal appendix. Vascular/Lymphatic: Negative Reproductive: IUD  in the uterus in good position. Other: Negative for free fluid Musculoskeletal: Negative IMPRESSION: No acute abnormality.  Negative for urinary tract calculi. Electronically Signed   By: Marlan Palauharles  Clark M.D.   On: 11/21/2017 14:02    Procedures Procedures (including critical care time)  Medications Ordered in ED Medications  sodium chloride 0.9 % bolus 1,000 mL ( Intravenous Stopped 11/21/17 1319)  ondansetron (ZOFRAN) injection 4 mg (4 mg Intravenous Given 11/21/17 1142)  ketorolac (TORADOL) 30 MG/ML injection 30 mg (30 mg Intravenous Given 11/21/17 1248)  metoCLOPramide (REGLAN) injection 10 mg (10 mg Intravenous Given 11/21/17 1247)  morphine 4 MG/ML injection 4 mg (4 mg Intravenous Given 11/21/17 1337)     Initial Impression / Assessment and Plan / ED Course  I have reviewed the triage vital signs and the nursing notes.  Pertinent labs &  imaging results that were available during my care of the patient were reviewed by me and considered in my medical decision making (see chart for details).     42 year old female with a history of rhabdomyolysis, hyperlipidemia, bipolar 1 disorder, and asthma presenting with right flank pain, nausea, and vomiting.  She was started on levofloxacin after she had a UA performed at work that showed pyuria and hemoglobinuria.  On exam, the patient is tender to the right CVA region.  Her urinalysis today is negative, but she is also been taking antibiotics for the last few days.  CT stone study is negative.  Labs are otherwise reassuring.  The patient was given Zofran by triage staff, but continued to have nausea.  Reglan given with resolution of her symptoms.  She was successfully fluid challenge.  Toradol initially given for pain control, but she later required morphine.  After 1 dose of morphine, she reports that her pain had resolved.  She was having no further symptoms.  She continued to have some mild right flank pain.  Low suspicion for ovarian torsion, appendicitis, or pyelonephritis.  She is afebrile.  At this time, I am uncertain of the etiology of her symptoms, but she is well-appearing and in no acute distress.Will discharge with Reglan and encourage PCP follow-up if her symptoms do not improve in the next few days.  Strict return precautions given.  She is hemodynamically stable and safe for discharge home with outpatient follow-up.  Final Clinical Impressions(s) / ED Diagnoses   Final diagnoses:  Right flank pain    ED Discharge Orders         Ordered    metoCLOPramide (REGLAN) 10 MG tablet  Every 6 hours     11/21/17 1528           Annalisa Colonna A, PA-C 11/21/17 2103    Sabas SousBero, Michael M, MD 11/24/17 [id number]

## 2017-12-12 DIAGNOSIS — R9431 Abnormal electrocardiogram [ECG] [EKG]: Secondary | ICD-10-CM | POA: Diagnosis not present

## 2017-12-12 DIAGNOSIS — M79604 Pain in right leg: Secondary | ICD-10-CM | POA: Diagnosis not present

## 2017-12-12 DIAGNOSIS — R0989 Other specified symptoms and signs involving the circulatory and respiratory systems: Secondary | ICD-10-CM | POA: Diagnosis not present

## 2017-12-12 DIAGNOSIS — R002 Palpitations: Secondary | ICD-10-CM | POA: Diagnosis not present

## 2017-12-14 DIAGNOSIS — F2081 Schizophreniform disorder: Secondary | ICD-10-CM | POA: Diagnosis not present

## 2017-12-16 DIAGNOSIS — R002 Palpitations: Secondary | ICD-10-CM | POA: Diagnosis not present

## 2017-12-22 DIAGNOSIS — R9431 Abnormal electrocardiogram [ECG] [EKG]: Secondary | ICD-10-CM | POA: Diagnosis not present

## 2017-12-26 DIAGNOSIS — M79605 Pain in left leg: Secondary | ICD-10-CM | POA: Diagnosis not present

## 2017-12-26 DIAGNOSIS — R9431 Abnormal electrocardiogram [ECG] [EKG]: Secondary | ICD-10-CM | POA: Diagnosis not present

## 2017-12-26 DIAGNOSIS — M79604 Pain in right leg: Secondary | ICD-10-CM | POA: Diagnosis not present

## 2017-12-26 DIAGNOSIS — R0989 Other specified symptoms and signs involving the circulatory and respiratory systems: Secondary | ICD-10-CM | POA: Diagnosis not present

## 2018-01-05 DIAGNOSIS — R0989 Other specified symptoms and signs involving the circulatory and respiratory systems: Secondary | ICD-10-CM | POA: Diagnosis not present

## 2018-01-05 DIAGNOSIS — M79605 Pain in left leg: Secondary | ICD-10-CM | POA: Diagnosis not present

## 2018-01-05 DIAGNOSIS — R9431 Abnormal electrocardiogram [ECG] [EKG]: Secondary | ICD-10-CM | POA: Diagnosis not present

## 2018-01-05 DIAGNOSIS — M79604 Pain in right leg: Secondary | ICD-10-CM | POA: Diagnosis not present

## 2018-01-15 DIAGNOSIS — S335XXD Sprain of ligaments of lumbar spine, subsequent encounter: Secondary | ICD-10-CM | POA: Diagnosis not present

## 2018-04-12 DIAGNOSIS — F2081 Schizophreniform disorder: Secondary | ICD-10-CM | POA: Diagnosis not present

## 2018-04-12 DIAGNOSIS — F250 Schizoaffective disorder, bipolar type: Secondary | ICD-10-CM | POA: Diagnosis not present

## 2018-04-13 DIAGNOSIS — N951 Menopausal and female climacteric states: Secondary | ICD-10-CM | POA: Diagnosis not present

## 2018-05-04 DIAGNOSIS — J111 Influenza due to unidentified influenza virus with other respiratory manifestations: Secondary | ICD-10-CM | POA: Diagnosis not present

## 2018-05-11 DIAGNOSIS — J988 Other specified respiratory disorders: Secondary | ICD-10-CM | POA: Diagnosis not present

## 2018-05-11 DIAGNOSIS — R51 Headache: Secondary | ICD-10-CM | POA: Diagnosis not present

## 2018-09-12 DIAGNOSIS — G43509 Persistent migraine aura without cerebral infarction, not intractable, without status migrainosus: Secondary | ICD-10-CM | POA: Diagnosis not present

## 2018-09-20 DIAGNOSIS — H5213 Myopia, bilateral: Secondary | ICD-10-CM | POA: Diagnosis not present

## 2018-09-25 DIAGNOSIS — M545 Low back pain: Secondary | ICD-10-CM | POA: Diagnosis not present

## 2018-11-02 DIAGNOSIS — R079 Chest pain, unspecified: Secondary | ICD-10-CM | POA: Diagnosis not present

## 2018-11-08 DIAGNOSIS — F2081 Schizophreniform disorder: Secondary | ICD-10-CM | POA: Diagnosis not present

## 2018-11-08 DIAGNOSIS — F250 Schizoaffective disorder, bipolar type: Secondary | ICD-10-CM | POA: Diagnosis not present

## 2018-11-30 DIAGNOSIS — R51 Headache: Secondary | ICD-10-CM | POA: Diagnosis not present

## 2018-11-30 DIAGNOSIS — J069 Acute upper respiratory infection, unspecified: Secondary | ICD-10-CM | POA: Diagnosis not present

## 2018-11-30 DIAGNOSIS — R451 Restlessness and agitation: Secondary | ICD-10-CM | POA: Diagnosis not present

## 2018-11-30 DIAGNOSIS — Z20828 Contact with and (suspected) exposure to other viral communicable diseases: Secondary | ICD-10-CM | POA: Diagnosis not present

## 2018-11-30 DIAGNOSIS — R197 Diarrhea, unspecified: Secondary | ICD-10-CM | POA: Diagnosis not present

## 2018-12-11 DIAGNOSIS — G43839 Menstrual migraine, intractable, without status migrainosus: Secondary | ICD-10-CM | POA: Diagnosis not present

## 2018-12-11 DIAGNOSIS — G43719 Chronic migraine without aura, intractable, without status migrainosus: Secondary | ICD-10-CM | POA: Diagnosis not present

## 2018-12-25 DIAGNOSIS — G43839 Menstrual migraine, intractable, without status migrainosus: Secondary | ICD-10-CM | POA: Diagnosis not present

## 2018-12-25 DIAGNOSIS — G43719 Chronic migraine without aura, intractable, without status migrainosus: Secondary | ICD-10-CM | POA: Diagnosis not present

## 2019-01-18 DIAGNOSIS — M79672 Pain in left foot: Secondary | ICD-10-CM | POA: Diagnosis not present

## 2019-01-18 DIAGNOSIS — L600 Ingrowing nail: Secondary | ICD-10-CM | POA: Diagnosis not present

## 2019-02-08 DIAGNOSIS — S46811A Strain of other muscles, fascia and tendons at shoulder and upper arm level, right arm, initial encounter: Secondary | ICD-10-CM | POA: Diagnosis not present

## 2019-02-08 DIAGNOSIS — M62838 Other muscle spasm: Secondary | ICD-10-CM | POA: Diagnosis not present

## 2019-04-06 DIAGNOSIS — G43719 Chronic migraine without aura, intractable, without status migrainosus: Secondary | ICD-10-CM | POA: Diagnosis not present

## 2019-04-06 DIAGNOSIS — G43839 Menstrual migraine, intractable, without status migrainosus: Secondary | ICD-10-CM | POA: Diagnosis not present

## 2019-05-28 DIAGNOSIS — G43909 Migraine, unspecified, not intractable, without status migrainosus: Secondary | ICD-10-CM | POA: Diagnosis not present

## 2019-05-28 DIAGNOSIS — F319 Bipolar disorder, unspecified: Secondary | ICD-10-CM | POA: Diagnosis not present

## 2019-05-28 DIAGNOSIS — J45909 Unspecified asthma, uncomplicated: Secondary | ICD-10-CM | POA: Diagnosis not present

## 2019-06-01 DIAGNOSIS — H01001 Unspecified blepharitis right upper eyelid: Secondary | ICD-10-CM | POA: Diagnosis not present

## 2019-06-01 DIAGNOSIS — H01004 Unspecified blepharitis left upper eyelid: Secondary | ICD-10-CM | POA: Diagnosis not present

## 2019-06-04 DIAGNOSIS — Z1231 Encounter for screening mammogram for malignant neoplasm of breast: Secondary | ICD-10-CM | POA: Diagnosis not present

## 2019-06-07 DIAGNOSIS — K219 Gastro-esophageal reflux disease without esophagitis: Secondary | ICD-10-CM | POA: Diagnosis not present

## 2019-06-07 DIAGNOSIS — Z800 Family history of malignant neoplasm of digestive organs: Secondary | ICD-10-CM | POA: Diagnosis not present

## 2019-06-07 DIAGNOSIS — K5900 Constipation, unspecified: Secondary | ICD-10-CM | POA: Diagnosis not present

## 2019-06-15 DIAGNOSIS — Z800 Family history of malignant neoplasm of digestive organs: Secondary | ICD-10-CM | POA: Diagnosis not present

## 2019-06-15 DIAGNOSIS — K635 Polyp of colon: Secondary | ICD-10-CM | POA: Diagnosis not present

## 2019-06-15 DIAGNOSIS — R12 Heartburn: Secondary | ICD-10-CM | POA: Diagnosis not present

## 2019-06-18 DIAGNOSIS — M546 Pain in thoracic spine: Secondary | ICD-10-CM | POA: Diagnosis not present

## 2019-06-27 DIAGNOSIS — Z01419 Encounter for gynecological examination (general) (routine) without abnormal findings: Secondary | ICD-10-CM | POA: Diagnosis not present

## 2019-07-16 DIAGNOSIS — J302 Other seasonal allergic rhinitis: Secondary | ICD-10-CM | POA: Diagnosis not present

## 2019-07-16 DIAGNOSIS — K5904 Chronic idiopathic constipation: Secondary | ICD-10-CM | POA: Diagnosis not present

## 2019-07-16 DIAGNOSIS — J0100 Acute maxillary sinusitis, unspecified: Secondary | ICD-10-CM | POA: Diagnosis not present

## 2019-07-16 DIAGNOSIS — J4520 Mild intermittent asthma, uncomplicated: Secondary | ICD-10-CM | POA: Diagnosis not present

## 2019-08-13 DIAGNOSIS — Z131 Encounter for screening for diabetes mellitus: Secondary | ICD-10-CM | POA: Diagnosis not present

## 2019-08-13 DIAGNOSIS — Z1329 Encounter for screening for other suspected endocrine disorder: Secondary | ICD-10-CM | POA: Diagnosis not present

## 2019-08-13 DIAGNOSIS — J302 Other seasonal allergic rhinitis: Secondary | ICD-10-CM | POA: Diagnosis not present

## 2019-08-13 DIAGNOSIS — Z130 Encounter for screening for diseases of the blood and blood-forming organs and certain disorders involving the immune mechanism: Secondary | ICD-10-CM | POA: Diagnosis not present

## 2019-08-13 DIAGNOSIS — K5904 Chronic idiopathic constipation: Secondary | ICD-10-CM | POA: Diagnosis not present

## 2019-08-13 DIAGNOSIS — S39012A Strain of muscle, fascia and tendon of lower back, initial encounter: Secondary | ICD-10-CM | POA: Diagnosis not present

## 2019-08-13 DIAGNOSIS — Z13220 Encounter for screening for lipoid disorders: Secondary | ICD-10-CM | POA: Diagnosis not present

## 2019-09-13 ENCOUNTER — Encounter (HOSPITAL_BASED_OUTPATIENT_CLINIC_OR_DEPARTMENT_OTHER): Payer: Self-pay | Admitting: Emergency Medicine

## 2019-09-13 ENCOUNTER — Other Ambulatory Visit: Payer: Self-pay

## 2019-09-13 ENCOUNTER — Emergency Department (HOSPITAL_BASED_OUTPATIENT_CLINIC_OR_DEPARTMENT_OTHER)
Admission: EM | Admit: 2019-09-13 | Discharge: 2019-09-13 | Disposition: A | Discharge status: Home / Self Care | Payer: BLUE CROSS/BLUE SHIELD | Attending: Emergency Medicine | Admitting: Emergency Medicine

## 2019-09-13 DIAGNOSIS — E039 Hypothyroidism, unspecified: Secondary | ICD-10-CM | POA: Insufficient documentation

## 2019-09-13 DIAGNOSIS — J45909 Unspecified asthma, uncomplicated: Secondary | ICD-10-CM | POA: Insufficient documentation

## 2019-09-13 DIAGNOSIS — H60332 Swimmer's ear, left ear: Secondary | ICD-10-CM | POA: Insufficient documentation

## 2019-09-13 DIAGNOSIS — H9202 Otalgia, left ear: Secondary | ICD-10-CM | POA: Diagnosis not present

## 2019-09-13 MED ORDER — HYDROCODONE-ACETAMINOPHEN 5-325 MG PO TABS: 1.0000 | ORAL_TABLET | ORAL | 0 refills | Status: DC | PRN

## 2019-09-13 MED ORDER — KETOROLAC TROMETHAMINE 30 MG/ML IJ SOLN
30.0000 mg | Freq: Once | INTRAMUSCULAR | Status: AC
  Administered 2019-09-13: 30 mg via INTRAMUSCULAR
  Filled 2019-09-13: qty 1

## 2019-09-13 MED ORDER — IBUPROFEN 800 MG PO TABS: 800.0000 mg | ORAL_TABLET | Freq: Three times a day (TID) | ORAL | 0 refills | Status: DC

## 2019-09-13 NOTE — Discharge Instructions (Addendum)
Continue current antibiotics and ear drops.

## 2019-09-13 NOTE — ED Triage Notes (Signed)
Left ear pain for 4 days, . States saw urgent care MD today, given a/b and ear drops. Did not take a/b. States still in pain. No OTC medications are helping.

## 2019-09-13 NOTE — ED Provider Notes (Signed)
[facility] EMERGENCY DEPARTMENT Provider Note   CSN: [id number] Arrival date & time: 09/13/19  1747     History Chief Complaint  Patient presents with  . Otalgia    Stacey Weaver is a 44 y.o. female.  Pt presents to the ED today with left ear pain.  Pt said she's had pain for 4 days.  She has not been able to sleep.  She has been on doxy for 4 days without improvement.  She saw UC today who gave her cortisporin and omnicef.  The pt said the pain is still there.        Past Medical History:  Diagnosis Date  . Anxiety   . Asthma   . Bipolar 1 disorder (HCC)   . Migraine     Patient Active Problem List   Diagnosis Date Noted  . Neck pain 01/06/2017  . Upper back pain 01/06/2017  . Cervical radiculopathy 01/06/2017  . Hyperlipidemia 01/29/2016  . Severe episode of recurrent major depressive disorder, without psychotic features (HCC)   . Insomnia   . MDD (major depressive disorder) 10/10/2015  . Bipolar I disorder, most recent episode depressed (HCC)   . Feeling suicidal 10/09/2015  . Rhabdomyolysis 08/08/2011  . Antihistamines overdose 08/08/2011  . SSRI overdose 08/07/2011  . Depression 08/07/2011  . Anxiety disorder 08/07/2011  . BMI 35.0-35.9,adult 08/07/2011  . Hypothyroidism 08/07/2011    Past Surgical History:  Procedure Laterality Date  . KNEE SURGERY       OB History   No obstetric history on file.     Family History  Problem Relation Age of Onset  . Hyperlipidemia Mother   . Hypertension Mother   . Heart disease Father 32       CAD  . Hyperlipidemia Maternal Grandmother   . Cancer Maternal Grandmother   . Cancer Maternal Grandfather   . Hyperlipidemia Brother     Social History   Tobacco Use  . Smoking status: Never Smoker  . Smokeless tobacco: Never Used  Substance Use Topics  . Alcohol use: No  . Drug use: No    Home Medications Prior to Admission medications   Medication Sig Start Date End Date Taking?  Authorizing Provider  ALPRAZolam Prudy Feeler) 0.25 MG tablet Take 0.25 mg by mouth at bedtime as needed for anxiety.     [provider]  carbamazepine (TEGRETOL) 200 MG tablet Take 1 tablet (200 mg total) by mouth 2 (two) times daily. 10/14/15   Starkes-Perry, Juel Burrow, FNP  HYDROcodone-acetaminophen (NORCO/VICODIN) 5-325 MG tablet Take 1 tablet by mouth every 4 (four) hours as needed. 09/13/19   Jacalyn Lefevre, MD  ibuprofen (ADVIL) 800 MG tablet Take 1 tablet (800 mg total) by mouth 3 (three) times daily. 09/13/19   Jacalyn Lefevre, MD  lamoTRIgine (LAMICTAL) 25 MG tablet Take 1 tablet (25 mg total) by mouth 2 (two) times daily. 10/14/15   Starkes-Perry, Juel Burrow, FNP  metoCLOPramide (REGLAN) 10 MG tablet Take 1 tablet (10 mg total) by mouth every 6 (six) hours. 11/21/17   McDonald, Mia A, PA-C  prochlorperazine (COMPAZINE) 10 MG tablet Take 1 tablet (10 mg total) by mouth 2 (two) times daily as needed for nausea or vomiting. 09/24/17   Tegeler, Canary Brim, MD  sertraline (ZOLOFT) 50 MG tablet Take 50 mg by mouth 2 (two) times daily.    [provider]  temazepam (RESTORIL) 30 MG capsule Take 30 mg by mouth at bedtime as needed for sleep.  [provider]  traZODone (DESYREL) 100 MG tablet Take 3 tablets by mouth at bedtime.    [provider]    Allergies    Almotriptan malate, Cymbalta [duloxetine hcl], Imitrex [sumatriptan], Prednisone, Zomig, Geodon [ziprasidone hcl], Lactose intolerance (gi), Penicillins, and Zyprexa [olanzapine]  Review of Systems   Review of Systems  HENT: Positive for ear pain.   All other systems reviewed and are negative.   Physical Exam Updated Vital Signs BP 118/65 (BP Location: Right Arm)   Pulse 78   Temp 98.2 F (36.8 C) (Oral)   Resp 20   Ht 5\' 4"  (1.626 m)   Wt 95.3 kg   SpO2 99%   BMI 36.06 kg/m   Physical Exam Vitals and nursing note reviewed.  Constitutional:      Appearance: Normal appearance.  HENT:      Head: Normocephalic and atraumatic.     Right Ear: External ear normal.     Left Ear: External ear normal. Tenderness present.     Nose: Nose normal.     Mouth/Throat:     Mouth: Mucous membranes are dry.     Pharynx: Oropharynx is clear.  Eyes:     Extraocular Movements: Extraocular movements intact.     Conjunctiva/sclera: Conjunctivae normal.     Pupils: Pupils are equal, round, and reactive to light.  Cardiovascular:     Rate and Rhythm: Normal rate and regular rhythm.     Pulses: Normal pulses.     Heart sounds: Normal heart sounds.  Pulmonary:     Effort: Pulmonary effort is normal.     Breath sounds: Normal breath sounds.  Abdominal:     General: Abdomen is flat. Bowel sounds are normal.     Palpations: Abdomen is soft.  Musculoskeletal:        General: Normal range of motion.     Cervical back: Normal range of motion and neck supple.  Skin:    General: Skin is warm.     Capillary Refill: Capillary refill takes less than 2 seconds.  Neurological:     General: No focal deficit present.     Mental Status: She is alert and oriented to person, place, and time.  Psychiatric:        Mood and Affect: Mood normal.        Behavior: Behavior normal.        Thought Content: Thought content normal.        Judgment: Judgment normal.     ED Results / Procedures / Treatments   Labs (all labs ordered are listed, but only abnormal results are displayed) Labs Reviewed - No data to display  EKG None  Radiology No results found.  Procedures Procedures (including critical care time)  Medications Ordered in ED Medications  ketorolac (TORADOL) 30 MG/ML injection 30 mg (has no administration in time range)    ED Course  I have reviewed the triage vital signs and the nursing notes.  Pertinent labs & imaging results that were available during my care of the patient were reviewed by me and considered in my medical decision making (see chart for details).    MDM  Rules/Calculators/A&P                         Pt told to continue current abx.  Pt given a dose of toradol here.  She is d/c with ibuprofen and lortab.  She is instructed to f/u with ENT if  sx do not improve.  Final Clinical Impression(s) / ED Diagnoses Final diagnoses:  Acute swimmer's ear of left side    Rx / DC Orders ED Discharge Orders         Ordered    HYDROcodone-acetaminophen (NORCO/VICODIN) 5-325 MG tablet  Every 4 hours PRN     Discontinue  Reprint     09/13/19 1807    ibuprofen (ADVIL) 800 MG tablet  3 times daily     Discontinue  Reprint     09/13/19 1807           Jacalyn Lefevre, MD 09/13/19 1813

## 2019-09-18 DIAGNOSIS — G43719 Chronic migraine without aura, intractable, without status migrainosus: Secondary | ICD-10-CM | POA: Diagnosis not present

## 2019-09-18 DIAGNOSIS — G43839 Menstrual migraine, intractable, without status migrainosus: Secondary | ICD-10-CM | POA: Diagnosis not present

## 2020-03-18 DIAGNOSIS — G43719 Chronic migraine without aura, intractable, without status migrainosus: Secondary | ICD-10-CM | POA: Diagnosis not present

## 2020-03-18 DIAGNOSIS — G43839 Menstrual migraine, intractable, without status migrainosus: Secondary | ICD-10-CM | POA: Diagnosis not present

## 2020-10-31 ENCOUNTER — Encounter (HOSPITAL_BASED_OUTPATIENT_CLINIC_OR_DEPARTMENT_OTHER): Payer: Self-pay | Admitting: Emergency Medicine

## 2020-10-31 ENCOUNTER — Other Ambulatory Visit: Payer: Self-pay

## 2020-10-31 ENCOUNTER — Emergency Department (HOSPITAL_BASED_OUTPATIENT_CLINIC_OR_DEPARTMENT_OTHER)
Admission: EM | Admit: 2020-10-31 | Discharge: 2020-10-31 | Disposition: A | Discharge status: Home / Self Care | Payer: No Typology Code available for payment source | Attending: Emergency Medicine | Admitting: Emergency Medicine

## 2020-10-31 DIAGNOSIS — E039 Hypothyroidism, unspecified: Secondary | ICD-10-CM | POA: Diagnosis not present

## 2020-10-31 DIAGNOSIS — Z8669 Personal history of other diseases of the nervous system and sense organs: Secondary | ICD-10-CM | POA: Insufficient documentation

## 2020-10-31 DIAGNOSIS — R519 Headache, unspecified: Secondary | ICD-10-CM | POA: Insufficient documentation

## 2020-10-31 DIAGNOSIS — R110 Nausea: Secondary | ICD-10-CM | POA: Diagnosis not present

## 2020-10-31 DIAGNOSIS — J45909 Unspecified asthma, uncomplicated: Secondary | ICD-10-CM | POA: Insufficient documentation

## 2020-10-31 MED ORDER — PROCHLORPERAZINE EDISYLATE 10 MG/2ML IJ SOLN: 10.0000 mg | Freq: Once | INTRAMUSCULAR | Status: DC

## 2020-10-31 MED ORDER — KETOROLAC TROMETHAMINE 15 MG/ML IJ SOLN
15.0000 mg | Freq: Once | INTRAMUSCULAR | Status: AC
  Administered 2020-10-31: 15 mg via INTRAMUSCULAR
  Filled 2020-10-31: qty 1

## 2020-10-31 MED ORDER — SODIUM CHLORIDE 0.9 % IV BOLUS: 500.0000 mL | Freq: Once | INTRAVENOUS | Status: DC

## 2020-10-31 MED ORDER — KETOROLAC TROMETHAMINE 15 MG/ML IJ SOLN: 7.5000 mg | Freq: Once | INTRAMUSCULAR | Status: DC

## 2020-10-31 MED ORDER — PROCHLORPERAZINE EDISYLATE 10 MG/2ML IJ SOLN
10.0000 mg | Freq: Once | INTRAMUSCULAR | Status: AC
  Administered 2020-10-31: 10 mg via INTRAMUSCULAR
  Filled 2020-10-31: qty 2

## 2020-10-31 MED ORDER — DIPHENHYDRAMINE HCL 50 MG/ML IJ SOLN: 12.5000 mg | Freq: Once | INTRAMUSCULAR | Status: DC

## 2020-10-31 NOTE — ED Provider Notes (Signed)
[facility] EMERGENCY DEPARTMENT Provider Note  CSN: [id number] Arrival date & time: 10/31/20 0423  Chief Complaint(s) Headache  HPI Stacey Weaver is a 45 y.o. female with a past medical history listed below including migraines who was diagnosed with COVID-19 on August 1 here for gradually worsening bitemporal headache described as stabbing ice picks.  Worse than usual migraines.  Exacerbated with light and sound.  Has tried taking over-the-counter medication with minimal relief.Endorsed nausea without emesis.  No coughing or congestion.  No nuchal rigidity.    No focal deficits.  No visual disturbance.   Headache  Past Medical History Past Medical History:  Diagnosis Date   Anxiety    Asthma    Bipolar 1 disorder (HCC)    Migraine    Patient Active Problem List   Diagnosis Date Noted   Neck pain 01/06/2017   Upper back pain 01/06/2017   Cervical radiculopathy 01/06/2017   Hyperlipidemia 01/29/2016   Severe episode of recurrent major depressive disorder, without psychotic features (HCC)    Insomnia    MDD (major depressive disorder) 10/10/2015   Bipolar I disorder, most recent episode depressed (HCC)    Feeling suicidal 10/09/2015   Rhabdomyolysis 08/08/2011   Antihistamines overdose 08/08/2011   SSRI overdose 08/07/2011   Depression 08/07/2011   Anxiety disorder 08/07/2011   BMI 35.0-35.9,adult 08/07/2011   Hypothyroidism 08/07/2011   Home Medication(s) Prior to Admission medications   Medication Sig Start Date End Date Taking? Authorizing Provider  ALPRAZolam Prudy Feeler) 0.25 MG tablet Take 0.25 mg by mouth at bedtime as needed for anxiety.     [provider]  carbamazepine (TEGRETOL) 200 MG tablet Take 1 tablet (200 mg total) by mouth 2 (two) times daily. 10/14/15   Starkes-Perry, Juel Burrow, FNP  HYDROcodone-acetaminophen (NORCO/VICODIN) 5-325 MG tablet Take 1 tablet by mouth every 4 (four) hours as needed. 09/13/19   Jacalyn Lefevre, MD  ibuprofen  (ADVIL) 800 MG tablet Take 1 tablet (800 mg total) by mouth 3 (three) times daily. 09/13/19   Jacalyn Lefevre, MD  lamoTRIgine (LAMICTAL) 25 MG tablet Take 1 tablet (25 mg total) by mouth 2 (two) times daily. 10/14/15   Starkes-Perry, Juel Burrow, FNP  metoCLOPramide (REGLAN) 10 MG tablet Take 1 tablet (10 mg total) by mouth every 6 (six) hours. 11/21/17   McDonald, Mia A, PA-C  prochlorperazine (COMPAZINE) 10 MG tablet Take 1 tablet (10 mg total) by mouth 2 (two) times daily as needed for nausea or vomiting. 09/24/17   Tegeler, Canary Brim, MD  sertraline (ZOLOFT) 50 MG tablet Take 50 mg by mouth 2 (two) times daily.    [provider]  temazepam (RESTORIL) 30 MG capsule Take 30 mg by mouth at bedtime as needed for sleep.    [provider]  traZODone (DESYREL) 100 MG tablet Take 3 tablets by mouth at bedtime.    [provider]  Past Surgical History Past Surgical History:  Procedure Laterality Date   KNEE SURGERY     Family History Family History  Problem Relation Age of Onset   Hyperlipidemia Mother    Hypertension Mother    Heart disease Father 66       CAD   Hyperlipidemia Maternal Grandmother    Cancer Maternal Grandmother    Cancer Maternal Grandfather    Hyperlipidemia Brother     Social History Social History   Tobacco Use   Smoking status: Never   Smokeless tobacco: Never  Substance Use Topics   Alcohol use: No   Drug use: No   Allergies Almotriptan malate, Cymbalta [duloxetine hcl], Imitrex [sumatriptan], Prednisone, Zomig, Geodon [ziprasidone hcl], Lactose intolerance (gi), Penicillins, and Zyprexa [olanzapine]  Review of Systems Review of Systems  Neurological:  Positive for headaches.  All other systems are reviewed and are negative for acute change except as noted in the HPI  Physical Exam Vital Signs  I  have reviewed the triage vital signs BP (!) 117/59   Pulse 98   Temp 99.7 F (37.6 C) (Oral)   Resp 16   Ht 5\' 4"  (1.626 m)   Wt 90.7 kg   SpO2 99%   BMI 34.33 kg/m   Physical Exam Vitals reviewed.  Constitutional:      General: She is not in acute distress.    Appearance: She is well-developed. She is not diaphoretic.  HENT:     Head: Normocephalic and atraumatic.     Right Ear: External ear normal.     Left Ear: External ear normal.     Nose: Nose normal.  Eyes:     General: No scleral icterus.    Conjunctiva/sclera: Conjunctivae normal.  Neck:     Trachea: Phonation normal.  Cardiovascular:     Rate and Rhythm: Normal rate and regular rhythm.  Pulmonary:     Effort: Pulmonary effort is normal. No respiratory distress.     Breath sounds: No stridor.  Abdominal:     General: There is no distension.  Musculoskeletal:        General: Normal range of motion.     Cervical back: Normal range of motion.  Neurological:     Mental Status: She is alert and oriented to person, place, and time.     Cranial Nerves: Cranial nerves are intact.     Sensory: Sensation is intact.     Motor: Motor function is intact.     Coordination: Coordination is intact.  Psychiatric:        Behavior: Behavior normal.    ED Results and Treatments Labs (all labs ordered are listed, but only abnormal results are displayed) Labs Reviewed - No data to display                                                                                                                       EKG  EKG Interpretation  Date/Time:    Ventricular Rate:  PR Interval:    QRS Duration:   QT Interval:    QTC Calculation:   R Axis:     Text Interpretation:         Radiology No results found.  Pertinent labs & imaging results that were available during my care of the patient were reviewed by me and considered in my medical decision making (see MDM for details).  Medications Ordered in ED Medications   ketorolac (TORADOL) 15 MG/ML injection 15 mg (15 mg Intramuscular Given 10/31/20 0456)  prochlorperazine (COMPAZINE) injection 10 mg (10 mg Intramuscular Given 10/31/20 0457)                                                                                                                                    Procedures Procedures  (including critical care time)  Medical Decision Making / ED Course I have reviewed the nursing notes for this encounter and the patient's prior records (if available in EHR or on provided paperwork).  Raevin Billey CoShante Hume was evaluated in Emergency Department on 10/31/2020 for the symptoms described in the history of present illness. She was evaluated in the context of the global COVID-19 pandemic, which necessitated consideration that the patient might be at risk for infection with the SARS-CoV-2 virus that causes COVID-19. Institutional protocols and algorithms that pertain to the evaluation of patients at risk for COVID-19 are in a state of rapid change based on information released by regulatory bodies including the CDC and federal and state organizations. These policies and algorithms were followed during the patient's care in the ED.     Typical migraine headache for the pt. Non focal neuro exam. No recent head trauma. No nuchal rigidity. Doubt meningitis. Doubt intracranial bleed. Doubt IIH. No indication for imaging.   Patient requested IM treatment with migraine cocktail and DC home.  Final Clinical Impression(s) / ED Diagnoses Final diagnoses:  Bad headache   The patient appears reasonably screened and/or stabilized for discharge and I doubt any other medical condition or other [facility] requiring further screening, evaluation, or treatment in the ED at this time prior to discharge. Safe for discharge with strict return precautions.  Disposition: Discharge  Condition: Good  I have discussed the results, Dx and Tx plan with the patient/family who expressed  understanding and agree(s) with the plan. Discharge instructions discussed at length. The patient/family was given strict return precautions who verbalized understanding of the instructions. No further questions at time of discharge.    ED Discharge Orders     None       Follow Up: Heron NayYoung, Lauren E, PA [address] [place] [id number] [phone]  Call  in 3-5 days, if symptoms do not improve or  worsen     This chart was dictated using voice recognition software.  Despite best efforts to proofread,  errors can occur which can change the documentation meaning.    Drema Pryardama, Abhijot Straughter  Domingo Cocking, MD 10/31/20 [phone]

## 2020-10-31 NOTE — ED Triage Notes (Signed)
Pt dx with Covid 8/1 and has HA since then. Tonight woke up with headache significantly worse. Pt also c/o nausea and light sensitivity. Pt has hx of migraines.

## 2021-07-01 ENCOUNTER — Ambulatory Visit (HOSPITAL_COMMUNITY)
Admission: EM | Admit: 2021-07-01 | Discharge: 2021-07-01 | Disposition: A | Discharge status: Home / Self Care | Payer: No Typology Code available for payment source

## 2021-07-01 DIAGNOSIS — R4586 Emotional lability: Secondary | ICD-10-CM | POA: Diagnosis not present

## 2021-07-01 DIAGNOSIS — Z91148 Patient's other noncompliance with medication regimen for other reason: Secondary | ICD-10-CM

## 2021-07-01 DIAGNOSIS — F3130 Bipolar disorder, current episode depressed, mild or moderate severity, unspecified: Secondary | ICD-10-CM | POA: Diagnosis not present

## 2021-07-01 DIAGNOSIS — F331 Major depressive disorder, recurrent, moderate: Secondary | ICD-10-CM

## 2021-07-01 NOTE — Progress Notes (Signed)
Pt presents as routine.  She is still grieving the death of her mother from back in December '21.  No HI, SI, A/V hallucinations.  Is looking for possible medication adjustment.  Has psychiatry currently.  Tearful at times in triage.   ?

## 2021-07-01 NOTE — ED Provider Notes (Signed)
[facility] Medical Screening Exam ? ?Patient Name: Stacey Weaver ?MRN: [id number] ?Date of Evaluation: 07/01/21 ?Chief Complaint:   ?Diagnosis:  ?Final diagnoses:  ?Moderate episode of recurrent major depressive disorder ([place])  ?Bipolar affective disorder, current episode depressed, current episode severity unspecified ([place])  ?Mood altered  ?Non compliance w medication regimen  ? ? ?History of Present illness: Stacey Weaver is a 46 y.o. female. Stacey Weaver 46 y.o female present to [facility] c/o of depress mood and sadness. According to patient she lost her mother in 2021,  and since then thing has not been the same,  she is depressed. " I'm really depressed and sad,  I'm still grieving my mom death".   ? ?Pt stated she had tried therapy in the past but don't thing it did anything for her,  pt currently see a psychiatrist at [place] for Bipolar disorder and depression.  Currently takes Carbamazepine 200 mg BID,  Lamictal 25 mg BID she had been on this dosage for years,  Quetiapine 200 mg BID however patient stated she only take 100 mg as needed when she can fall a sleep.  Zoloft 50 mg BID,  Trazodone 100 gm qhs.   ? ?Discuss with patient medication compliance Seroquel is order 200 mg BID therefore she need to take it as prescribed.   ? ?Observation of patient: pt is alert and oriented x 4,  speech clear,  mood depressed and  affect congruent with mood.  Pt denies SI,  HI, AVH.  Pt report feeling paranoia at time "something I cant differentiate reality,  I always feel like I'm going do something wrong,  and I feel like people are mad at me.   ? ?Recommendation: Pt to continue taking Seroquel as order 200 mg BID,  talk with psych provider about increasing Lamictal dosage.  Restart therapy  ? ?Psychiatric Specialty Exam ? ?Presentation  ?General Appearance:Appropriate for Environment ? ?Eye Contact:Good ? ?Speech:Clear and Coherent ? ?Speech  Volume:Normal ? ?Handedness:Ambidextrous ? ? ?Mood and Affect  ?Mood:Anxious; Depressed ? ?Affect:Depressed ? ? ?Thought Process  ?Thought Processes:Coherent ? ?Descriptions of Associations:Circumstantial ? ?Orientation:Full (Time, Place and Person) ? ?Thought Content:Abstract Reasoning ?   Hallucinations:None ? ?Ideas of Reference:Other (comment) ? ?Suicidal Thoughts:No ? ?Homicidal Thoughts:No ? ? ?Sensorium  ?Memory:Immediate Good ? ?Judgment:Fair ? ?Insight:Good ? ? ?Executive Functions  ?Concentration:Fair ? ?Attention Span:Good ? ?Recall:Good ? ?[employer] [place] ? ?Language:Good ? ? ?Psychomotor Activity  ?Psychomotor Activity:No data recorded ? ?Assets  ?Assets:No data recorded ? ?Sleep  ?Sleep:Good ? ?Number of hours: No data recorded ? ?Nutritional Assessment (For OBS and FBC admissions only) ?Has the patient had a weight loss or gain of 10 pounds or more in the last 3 months?: No ?Has the patient had a decrease in food intake/or appetite?: No ?Does the patient have dental problems?: No ?Does the patient have eating habits or behaviors that may be indicators of an eating disorder including binging or inducing vomiting?: No ?Has the patient recently lost weight without trying?: 0 ?Has the patient been eating poorly because of a decreased appetite?: 0 ?Malnutrition Screening Tool Score: 0 ? ? ? ?Physical Exam: ?Physical Exam ?HENT:  ?   Head: Normocephalic.  ?   Nose: Nose normal.  ?Cardiovascular:  ?   Rate and Rhythm: Normal rate.  ?Pulmonary:  ?   Effort: Pulmonary effort is normal.  ?Genitourinary: ?   General: Normal vulva.  ?Musculoskeletal:     ?   General:  Normal range of motion.  ?   Cervical back: Normal range of motion.  ?Skin: ?   General: Skin is warm.  ?Neurological:  ?   General: No focal deficit present.  ?   Mental Status: She is alert.  ?Psychiatric:     ?   Mood and Affect: Mood normal.     ?   Thought Content: Thought content normal.  ? ?Review of Systems  ?Constitutional: Negative.    ?HENT: Negative.    ?Eyes: Negative.   ?Respiratory: Negative.    ?Cardiovascular: Negative.   ?Gastrointestinal: Negative.   ?Genitourinary: Negative.   ?Musculoskeletal: Negative.   ?Skin: Negative.   ?Neurological: Negative.   ?Endo/Heme/Allergies: Negative.   ?Psychiatric/Behavioral:  Positive for depression. The patient is nervous/anxious.   ?Blood pressure 116/69, pulse 88, temperature 97.8 ?F (36.6 ?C), temperature source Oral, resp. rate 18, SpO2 100 %. There is no height or weight on file to calculate BMI. ? ?Musculoskeletal: ?Strength & Muscle Tone: within normal limits ?Gait & Station: normal ?Patient leans: N/A ? ? ?[facility] Discharge Disposition for Follow up and Recommendations: ?Based on my evaluation the patient does not appear to have an emergency medical condition and can be discharged with resources and follow up care in outpatient services for Individual Therapy ? ? ?Evette Georges, NP ?07/01/2021, 9:15 PM ? ?

## 2021-07-01 NOTE — Discharge Instructions (Signed)
Follow up with psychiatric ?Follow medication regimen as prescribed ?

## 2021-09-14 ENCOUNTER — Other Ambulatory Visit: Payer: Self-pay

## 2021-09-14 ENCOUNTER — Emergency Department (HOSPITAL_BASED_OUTPATIENT_CLINIC_OR_DEPARTMENT_OTHER): Payer: PRIVATE HEALTH INSURANCE

## 2021-09-14 ENCOUNTER — Encounter (HOSPITAL_BASED_OUTPATIENT_CLINIC_OR_DEPARTMENT_OTHER): Payer: Self-pay | Admitting: Emergency Medicine

## 2021-09-14 ENCOUNTER — Emergency Department (HOSPITAL_BASED_OUTPATIENT_CLINIC_OR_DEPARTMENT_OTHER)
Admission: EM | Admit: 2021-09-14 | Discharge: 2021-09-14 | Disposition: A | Discharge status: Home / Self Care | Payer: PRIVATE HEALTH INSURANCE | Attending: Emergency Medicine | Admitting: Emergency Medicine

## 2021-09-14 DIAGNOSIS — S161XXA Strain of muscle, fascia and tendon at neck level, initial encounter: Secondary | ICD-10-CM | POA: Insufficient documentation

## 2021-09-14 DIAGNOSIS — W19XXXA Unspecified fall, initial encounter: Secondary | ICD-10-CM | POA: Diagnosis not present

## 2021-09-14 DIAGNOSIS — E876 Hypokalemia: Secondary | ICD-10-CM | POA: Insufficient documentation

## 2021-09-14 DIAGNOSIS — R55 Syncope and collapse: Secondary | ICD-10-CM | POA: Insufficient documentation

## 2021-09-14 DIAGNOSIS — S199XXA Unspecified injury of neck, initial encounter: Secondary | ICD-10-CM | POA: Diagnosis present

## 2021-09-14 LAB — URINALYSIS, ROUTINE W REFLEX MICROSCOPIC
Bilirubin Urine: NEGATIVE
Glucose, UA: NEGATIVE mg/dL
Ketones, ur: NEGATIVE mg/dL
Leukocytes,Ua: NEGATIVE
Nitrite: NEGATIVE
Protein, ur: NEGATIVE mg/dL
Specific Gravity, Urine: >=1.03 (ref 1.005–1.030)
pH: 6 (ref 5.0–8.0)

## 2021-09-14 LAB — URINALYSIS, MICROSCOPIC (REFLEX)

## 2021-09-14 LAB — CBC
HCT: 40.3 % (ref 36.0–46.0)
Hemoglobin: 12.8 g/dL (ref 12.0–15.0)
MCH: 28.3 pg (ref 26.0–34.0)
MCHC: 31.8 g/dL (ref 30.0–36.0)
MCV: 89.2 fL (ref 80.0–100.0)
Platelets: 276 10*3/uL (ref 150–400)
RBC: 4.52 MIL/uL (ref 3.87–5.11)
RDW: 12.9 % (ref 11.5–15.5)
WBC: 5.9 10*3/uL (ref 4.0–10.5)
nRBC: 0 % (ref 0.0–0.2)

## 2021-09-14 LAB — BASIC METABOLIC PANEL
Anion gap: 6 (ref 5–15)
BUN: 7 mg/dL (ref 6–20)
CO2: 27 mmol/L (ref 22–32)
Calcium: 9 mg/dL (ref 8.9–10.3)
Chloride: 105 mmol/L (ref 98–111)
Creatinine, Ser: 1 mg/dL (ref 0.44–1.00)
GFR, Estimated: >60 mL/min (ref >60)
Glucose, Bld: 108 mg/dL — ABNORMAL HIGH (ref 70–99)
Potassium: 2.9 mmol/L — ABNORMAL LOW (ref 3.5–5.1)
Sodium: 138 mmol/L (ref 135–145)

## 2021-09-14 LAB — PREGNANCY, URINE: Preg Test, Ur: NEGATIVE

## 2021-09-14 LAB — TROPONIN I (HIGH SENSITIVITY): Troponin I (High Sensitivity): <2 ng/L (ref <18)

## 2021-09-14 LAB — CBG MONITORING, ED: Glucose-Capillary: 102 mg/dL — ABNORMAL HIGH (ref 70–99)

## 2021-09-14 LAB — MAGNESIUM: Magnesium: 1.9 mg/dL (ref 1.7–2.4)

## 2021-09-14 MED ORDER — METHOCARBAMOL 500 MG PO TABS: 500.0000 mg | ORAL_TABLET | Freq: Two times a day (BID) | ORAL | 0 refills | Status: DC | PRN

## 2021-09-14 MED ORDER — POTASSIUM CHLORIDE CRYS ER 20 MEQ PO TBCR
40.0000 meq | EXTENDED_RELEASE_TABLET | Freq: Once | ORAL | Status: AC
  Administered 2021-09-14: 40 meq via ORAL
  Filled 2021-09-14: qty 2

## 2021-09-14 MED ORDER — KETOROLAC TROMETHAMINE 30 MG/ML IJ SOLN
30.0000 mg | Freq: Once | INTRAMUSCULAR | Status: AC
  Administered 2021-09-14: 30 mg via INTRAVENOUS
  Filled 2021-09-14: qty 1

## 2021-09-14 MED ORDER — POTASSIUM CHLORIDE 10 MEQ/100ML IV SOLN
10.0000 meq | Freq: Once | INTRAVENOUS | Status: AC
  Administered 2021-09-14: 10 meq via INTRAVENOUS
  Filled 2021-09-14: qty 100

## 2021-09-14 MED ORDER — POTASSIUM CHLORIDE ER 10 MEQ PO TBCR: 10.0000 meq | EXTENDED_RELEASE_TABLET | Freq: Every day | ORAL | 0 refills | Status: DC

## 2021-09-14 MED ORDER — HYDROMORPHONE HCL 1 MG/ML IJ SOLN
0.5000 mg | Freq: Once | INTRAMUSCULAR | Status: AC
  Administered 2021-09-14: 0.5 mg via INTRAVENOUS
  Filled 2021-09-14: qty 1

## 2021-09-14 MED ORDER — SODIUM CHLORIDE 0.9 % IV BOLUS
1000.0000 mL | Freq: Once | INTRAVENOUS | Status: AC
  Administered 2021-09-14: 1000 mL via INTRAVENOUS

## 2021-09-14 NOTE — ED Notes (Signed)
D/c paperwork reviewed with pt, including prescriptions and f/u care. All questions addressed prior to d/c. Pt wheeled to ED exit to meet ride home.

## 2021-09-14 NOTE — ED Notes (Signed)
Pt transported to xray 

## 2021-09-14 NOTE — Discharge Instructions (Addendum)
Cardiology office should call you within 3 business days to set up a follow-up appointment.  If you do not hear from them, please call the number above for heart care.  Remember to take the potassium I prescribed daily for your low potassium levels.  Your doctor or cardiologist should recheck your potassium at the end of the week.  Continue drinking plenty of water at home.  Take your time when standing up.

## 2021-09-14 NOTE — ED Triage Notes (Signed)
Pt states ~45 minutes pta she had a syncopal episode at home. Reports she stood up out of bed, was walking to the restroom when episode occurred. No witness. Endorses right sided head pain and right shoulder pain from fall. + loc. Pt unsure how long she was out for. Denies blood thinners

## 2021-09-14 NOTE — ED Provider Notes (Signed)
[facility] EMERGENCY DEPARTMENT Provider Note   CSN: [id number] Arrival date & time: 09/14/21  1843     History {Add pertinent medical, surgical, social history, OB history to HPI:1} Chief Complaint  Patient presents with   Loss of Consciousness    Stacey Weaver is a 46 y.o. female presenting to the emergency department with an episode of syncope versus near syncope.  The patient reports she been lying on the bed watching television, she stood up in the bed and began to feel very lightheaded.  She subsequently started walking towards the bathroom and lost consciousness.  She is not certain how long she was unconscious.  She feels that she fell and struck her right shoulder and her head on the floor.  She is not on blood thinners.  She denies biting her tongue or urinating on herself or any history of seizures.  She denies history of syncope.  She reports that she does not drink a lot of water at home in the daytime.  She reports her blood pressure is typically low or normal, but not high.  HPI     Home Medications Prior to Admission medications   Medication Sig Start Date End Date Taking? Authorizing Provider  ALPRAZolam Prudy Feeler) 0.25 MG tablet Take 0.25 mg by mouth at bedtime as needed for anxiety.     [provider]  carbamazepine (TEGRETOL XR) 200 MG 12 hr tablet Take 200 mg by mouth 2 (two) times daily. 06/28/21   [provider]  carbamazepine (TEGRETOL) 200 MG tablet Take 1 tablet (200 mg total) by mouth 2 (two) times daily. 10/14/15   Starkes-Perry, Juel Burrow, FNP  HYDROcodone-acetaminophen (NORCO/VICODIN) 5-325 MG tablet Take 1 tablet by mouth every 4 (four) hours as needed. 09/13/19   Jacalyn Lefevre, MD  ibuprofen (ADVIL) 800 MG tablet Take 1 tablet (800 mg total) by mouth 3 (three) times daily. 09/13/19   Jacalyn Lefevre, MD  lamoTRIgine (LAMICTAL) 25 MG tablet Take 1 tablet (25 mg total) by mouth 2 (two) times daily. 10/14/15   Starkes-Perry, Juel Burrow, FNP  metoCLOPramide (REGLAN) 10 MG tablet Take 1 tablet (10 mg total) by mouth every 6 (six) hours. 11/21/17   McDonald, Mia A, PA-C  prochlorperazine (COMPAZINE) 10 MG tablet Take 1 tablet (10 mg total) by mouth 2 (two) times daily as needed for nausea or vomiting. 09/24/17   Tegeler, Canary Brim, MD  QUEtiapine (SEROQUEL) 200 MG tablet Take 200 mg by mouth 2 (two) times daily. 06/28/21   [provider]  sertraline (ZOLOFT) 50 MG tablet Take 50 mg by mouth 2 (two) times daily.    [provider]  temazepam (RESTORIL) 30 MG capsule Take 30 mg by mouth at bedtime as needed for sleep.    [provider]  traZODone (DESYREL) 100 MG tablet Take 3 tablets by mouth at bedtime.    [provider]      Allergies    Almotriptan malate, Cymbalta [duloxetine hcl], Imitrex [sumatriptan], Prednisone, Zomig, Geodon [ziprasidone hcl], Lactose intolerance (gi), Penicillins, and Zyprexa [olanzapine]    Review of Systems   Review of Systems  Physical Exam Updated Vital Signs BP 94/66   Pulse 76   Temp 98 F (36.7 C) (Oral)   Resp 17   Wt 88 kg   SpO2 100%   BMI 33.30 kg/m  Physical Exam Constitutional:      General: She is not in acute distress. HENT:     Head: Normocephalic and atraumatic.  Eyes:     Conjunctiva/sclera: Conjunctivae normal.     Pupils: Pupils are equal, round, and reactive to light.  Cardiovascular:     Rate and Rhythm: Normal rate and regular rhythm.  Pulmonary:     Effort: Pulmonary effort is normal. No respiratory distress.  Abdominal:     General: There is no distension.     Tenderness: There is no abdominal tenderness.  Skin:    General: Skin is warm and dry.  Neurological:     General: No focal deficit present.     Mental Status: She is alert and oriented to person, place, and time. Mental status is at baseline.  Psychiatric:        Mood and Affect: Mood normal.        Behavior: Behavior normal.     ED Results /  Procedures / Treatments   Labs (all labs ordered are listed, but only abnormal results are displayed) Labs Reviewed  BASIC METABOLIC PANEL - Abnormal; Notable for the following components:      Result Value   Potassium 2.9 (*)    Glucose, Bld 108 (*)    All other components within normal limits  URINALYSIS, ROUTINE W REFLEX MICROSCOPIC - Abnormal; Notable for the following components:   Color, Urine AMBER (*)    APPearance HAZY (*)    Hgb urine dipstick TRACE (*)    All other components within normal limits  URINALYSIS, MICROSCOPIC (REFLEX) - Abnormal; Notable for the following components:   Bacteria, UA MANY (*)    All other components within normal limits  CBG MONITORING, ED - Abnormal; Notable for the following components:   Glucose-Capillary 102 (*)    All other components within normal limits  CBC  PREGNANCY, URINE  MAGNESIUM  TROPONIN I (HIGH SENSITIVITY)    EKG EKG Interpretation  Date/Time:  Monday September 14 2021 18:57:46 EDT Ventricular Rate:  107 PR Interval:  146 QRS Duration: 68 QT Interval:  324 QTC Calculation: 432 R Axis:   67 Text Interpretation: Sinus tachycardia Nonspecific T wave abnormality Abnormal ECG When compared with ECG of 21-Nov-2017 11:34, PREVIOUS ECG IS PRESENT- no sig changes Confirmed by Alvester Chou [phone]) on 09/14/2021 7:43:30 PM  Radiology DG Shoulder Right  Result Date: 09/14/2021 CLINICAL DATA:  Syncope and subsequent fall. EXAM: RIGHT SHOULDER - 2+ VIEW COMPARISON:  None Available. FINDINGS: There is no evidence of fracture or dislocation. There is no evidence of arthropathy or other focal bone abnormality. Soft tissues are unremarkable. IMPRESSION: Negative. Electronically Signed   By: Aram Candela M.D.   On: 09/14/2021 19:39    Procedures Procedures  {Document cardiac monitor, telemetry assessment procedure when appropriate:1}  Medications Ordered in ED Medications  HYDROmorphone (DILAUDID) injection 0.5 mg (has no  administration in time range)  potassium chloride 10 mEq in 100 mL IVPB (0 mEq Intravenous Stopped 09/14/21 2107)  potassium chloride SA (KLOR-CON M) CR tablet 40 mEq (40 mEq Oral Given 09/14/21 2010)  sodium chloride 0.9 % bolus 1,000 mL (1,000 mLs Intravenous New Bag/Given 09/14/21 2008)  ketorolac (TORADOL) 30 MG/ML injection 30 mg (30 mg Intravenous Given 09/14/21 2008)    ED Course/ Medical Decision Making/ A&P                           Medical Decision Making Amount and/or Complexity of Data Reviewed Labs: ordered. Radiology: ordered.  Risk Prescription drug management.   This patient presents to the ED with  concern for near syncope or syncope. This involves an extensive number of treatment options, and is a complaint that carries with it a high risk of complications and morbidity.  The differential diagnosis includes orthostatic hypotension versus arrhythmia versus anemia versus vasovagal episode versus other   I ordered and personally interpreted labs.  The pertinent results include: Hypokalemia potassium 2.9.  No AKI.  No acute anemia.  I ordered imaging studies including x-ray of the right shoulder I independently visualized and interpreted imaging which showed no focal fracture dislocation or pneumothorax I agree with the radiologist interpretation  The patient was maintained on a cardiac monitor.  I personally viewed and interpreted the cardiac monitored which showed an underlying rhythm of: Normal sinus rhythm  Per my interpretation the patient's ECG shows normal sinus rhythm no acute ischemic findings  I ordered medication including IV potassium, oral potassium, IV fluids I have reviewed the patients home medicines and have made adjustments as needed  Test Considered: Low suspicion for acute PE at this time, no tachycardia or hypoxia normal troponins, unlikely to have a large PE to cause syncope.  After the interventions noted above, I reevaluated the patient and found  that they have: improved  Dispostion:  After consideration of the diagnostic results and the patients response to treatment, I feel that the patent would benefit from outpatient follow-up and referral to cardiology.  Telemetry did not show arrhythmia here, but she may be due for an echocardiogram or that she may benefit from this type of evaluation as an outpatient.  We will continue replenishing potassium as an outpatient.  Her fiance is coming to pick her up and take her home.   {Document critical care time when appropriate:1} {Document review of labs and clinical decision tools ie heart score, Chads2Vasc2 etc:1}  {Document your independent review of radiology images, and any outside records:1} {Document your discussion with family members, caretakers, and with consultants:1} {Document social determinants of health affecting pt's care:1} {Document your decision making why or why not admission, treatments were needed:1} Final Clinical Impression(s) / ED Diagnoses Final diagnoses:  Near syncope    Rx / DC Orders ED Discharge Orders     None

## 2021-12-11 ENCOUNTER — Other Ambulatory Visit: Payer: Self-pay

## 2021-12-11 ENCOUNTER — Emergency Department (HOSPITAL_BASED_OUTPATIENT_CLINIC_OR_DEPARTMENT_OTHER)
Admission: EM | Admit: 2021-12-11 | Discharge: 2021-12-11 | Disposition: A | Discharge status: Home / Self Care | Payer: PRIVATE HEALTH INSURANCE | Attending: Emergency Medicine | Admitting: Emergency Medicine

## 2021-12-11 ENCOUNTER — Encounter (HOSPITAL_BASED_OUTPATIENT_CLINIC_OR_DEPARTMENT_OTHER): Payer: Self-pay | Admitting: *Deleted

## 2021-12-11 DIAGNOSIS — G43009 Migraine without aura, not intractable, without status migrainosus: Secondary | ICD-10-CM | POA: Insufficient documentation

## 2021-12-11 DIAGNOSIS — G43109 Migraine with aura, not intractable, without status migrainosus: Secondary | ICD-10-CM

## 2021-12-11 MED ORDER — METOCLOPRAMIDE HCL 5 MG/ML IJ SOLN
10.0000 mg | Freq: Once | INTRAMUSCULAR | Status: AC
  Administered 2021-12-11: 10 mg via INTRAMUSCULAR
  Filled 2021-12-11: qty 2

## 2021-12-11 MED ORDER — KETOROLAC TROMETHAMINE 30 MG/ML IJ SOLN
30.0000 mg | Freq: Once | INTRAMUSCULAR | Status: AC
  Administered 2021-12-11: 30 mg via INTRAMUSCULAR
  Filled 2021-12-11: qty 1

## 2021-12-11 NOTE — ED Provider Notes (Signed)
[facility] EMERGENCY DEPARTMENT Provider Note   CSN: [id number] Arrival date & time: 12/11/21  1353     History  Chief Complaint  Patient presents with   Migraine    Stacey Weaver is a 46 y.o. female.  Patient is a 46 year old female with past medical history of migraine headaches and bipolar disorder presenting to the emergency department with a headache.  Patient states that she was at work this morning when she started to get her usual prodromal symptoms with lines in her vision and pain behind her left eye.  She reports associated photophobia and phonophobia with nausea.  She denies any fevers or neck pain, numbness or weakness.  She states that she tried taking Motrin this morning and had Tylenol at lunch but had no significant improvement so she decided to come to the emergency department.  She states this feels typical of her migraine headaches and that she has occasionally had to come to the ER for her headaches in the past.  She states that she does follow with neurology and is taking Bernita Raisin but has had no significant improvement of her migraines on this medication.  The history is provided by the patient.  Migraine       Home Medications Prior to Admission medications   Medication Sig Start Date End Date Taking? Authorizing Provider  ALPRAZolam Prudy Feeler) 0.25 MG tablet Take 0.25 mg by mouth at bedtime as needed for anxiety.     [provider]  carbamazepine (TEGRETOL XR) 200 MG 12 hr tablet Take 200 mg by mouth 2 (two) times daily. 06/28/21   [provider]  carbamazepine (TEGRETOL) 200 MG tablet Take 1 tablet (200 mg total) by mouth 2 (two) times daily. 10/14/15   Starkes-Perry, Juel Burrow, FNP  HYDROcodone-acetaminophen (NORCO/VICODIN) 5-325 MG tablet Take 1 tablet by mouth every 4 (four) hours as needed. 09/13/19   Jacalyn Lefevre, MD  ibuprofen (ADVIL) 800 MG tablet Take 1 tablet (800 mg total) by mouth 3 (three) times daily. 09/13/19    Jacalyn Lefevre, MD  lamoTRIgine (LAMICTAL) 25 MG tablet Take 1 tablet (25 mg total) by mouth 2 (two) times daily. 10/14/15   Starkes-Perry, Juel Burrow, FNP  methocarbamol (ROBAXIN) 500 MG tablet Take 1 tablet (500 mg total) by mouth 2 (two) times daily as needed for up to 15 doses for muscle spasms. 09/14/21   Terald Sleeper, MD  metoCLOPramide (REGLAN) 10 MG tablet Take 1 tablet (10 mg total) by mouth every 6 (six) hours. 11/21/17   McDonald, Mia A, PA-C  potassium chloride (KLOR-CON) 10 MEQ tablet Take 1 tablet (10 mEq total) by mouth daily for 30 doses. 09/14/21 10/14/21  Terald Sleeper, MD  prochlorperazine (COMPAZINE) 10 MG tablet Take 1 tablet (10 mg total) by mouth 2 (two) times daily as needed for nausea or vomiting. 09/24/17   Tegeler, Canary Brim, MD  QUEtiapine (SEROQUEL) 200 MG tablet Take 200 mg by mouth 2 (two) times daily. 06/28/21   [provider]  sertraline (ZOLOFT) 50 MG tablet Take 50 mg by mouth 2 (two) times daily.    [provider]  temazepam (RESTORIL) 30 MG capsule Take 30 mg by mouth at bedtime as needed for sleep.    [provider]  traZODone (DESYREL) 100 MG tablet Take 3 tablets by mouth at bedtime.    [provider]      Allergies    Almotriptan malate, Cymbalta [duloxetine hcl], Imitrex [sumatriptan], Prednisone, Zomig, Geodon [ziprasidone hcl],  Lactose intolerance (gi), Penicillins, and Zyprexa [olanzapine]    Review of Systems   Review of Systems  Physical Exam Updated Vital Signs BP 107/66 (BP Location: Right Arm)   Pulse 80   Temp 98.1 F (36.7 C) (Oral)   Resp 17   SpO2 100%  Physical Exam Vitals and nursing note reviewed.  Constitutional:      General: She is not in acute distress.    Appearance: Normal appearance.     Comments: Lying in the dark room  HENT:     Head: Normocephalic and atraumatic.     Nose: Nose normal.     Mouth/Throat:     Mouth: Mucous membranes are moist.  Eyes:     Extraocular  Movements: Extraocular movements intact.     Conjunctiva/sclera: Conjunctivae normal.     Pupils: Pupils are equal, round, and reactive to light.  Cardiovascular:     Rate and Rhythm: Normal rate and regular rhythm.  Pulmonary:     Effort: Pulmonary effort is normal.     Breath sounds: Normal breath sounds.  Abdominal:     General: Abdomen is flat.     Palpations: Abdomen is soft.     Tenderness: There is no abdominal tenderness.  Musculoskeletal:        General: Normal range of motion.     Cervical back: Normal range of motion and neck supple.     Right lower leg: No edema.     Left lower leg: No edema.  Skin:    General: Skin is warm and dry.  Neurological:     General: No focal deficit present.     Mental Status: She is alert and oriented to person, place, and time.     Sensory: No sensory deficit.     Motor: No weakness.  Psychiatric:        Mood and Affect: Mood normal.        Behavior: Behavior normal.     ED Results / Procedures / Treatments   Labs (all labs ordered are listed, but only abnormal results are displayed) Labs Reviewed - No data to display  EKG None  Radiology No results found.  Procedures Procedures    Medications Ordered in ED Medications  ketorolac (TORADOL) 30 MG/ML injection 30 mg (has no administration in time range)  metoCLOPramide (REGLAN) injection 10 mg (has no administration in time range)    ED Course/ Medical Decision Making/ A&P Clinical Course as of 12/11/21 1743  Fri Dec 11, 2021  1740 On reassessment, patient has received her medications.  She reports no significant change in her headache but states that these medications usually work well for her and that she would rather manage her headache at home.  The patient is stable for discharge and was recommended to follow-up with her primary care doctor.  She was given strict return precautions. [VK]    Clinical Course User Index [VK] Phoebe Sharps, DO                            Medical Decision Making This patient presents to the ED with chief complaint(s) of headache with pertinent past medical history of migraines which further complicates the presenting complaint. The complaint involves an extensive differential diagnosis and also carries with it a high risk of complications and morbidity.    The differential diagnosis includes likely migraine headache as this is consistent with her previous migraines, no sudden onset,  trauma or focal neurologic deficits making ICH or mass effect unlikely, no fever or neck stiffness making meningitis or encephalitis unlikely  Additional history obtained: Additional history obtained from N/A Records reviewed Primary Care Documents  ED Course and Reassessment: Upon patient's arrival she is lying in a dark room otherwise well-appearing with no focal neurologic deficits.  Tachycardia is likely secondary to migraine and she will be treated with IM Toradol and Reglan she is refusing IV.  She will be closely reassessed.  Independent labs interpretation:  N/A  Independent visualization of imaging: N/A  Consultation: - Consulted or discussed management/test interpretation w/ external professional: N/A  Consideration for admission or further workup: Patient has no emergent conditions that require admission at this time and is stable for discharge home with primary care follow-up. Social Determinants of health: N/A    Risk Prescription drug management.           Final Clinical Impression(s) / ED Diagnoses Final diagnoses:  None    Rx / DC Orders ED Discharge Orders     None         Phoebe Sharps, DO 12/11/21 1744

## 2021-12-11 NOTE — ED Notes (Signed)
Pt in bed resting, respirations even and unlabored. 

## 2021-12-11 NOTE — Discharge Instructions (Signed)
You were seen in the emergency department for your migraine.  We treated you with Reglan and Toradol to help with your headache.  Toradol is a shot version of an NSAID so do not take any additional Advil for at least 6 hours after you got the Toradol (11 PM tonight).  You are due for another dose of Tylenol around 6 PM and you can take this at home if you are still having a headache.  You should follow-up with your primary doctor in the next few days to have your symptoms rechecked.  You should return to the emergency department if your headaches getting significantly worse, it feels different from her typical migraines, you have numbness or weakness on one side of your body compared to the other, you have confusion, you have fevers or if you have any other new or concerning symptoms.

## 2021-12-11 NOTE — ED Triage Notes (Signed)
Pt with hx of migraines is here for migraine which began this am.  She states that it began at work this am and it has been associated with sensitivity to light and nausea. Pt has been here before for same and received both IM as well as IV migraine cocktail for this. No neuro deficits

## 2022-05-09 ENCOUNTER — Encounter (HOSPITAL_BASED_OUTPATIENT_CLINIC_OR_DEPARTMENT_OTHER): Payer: Self-pay | Admitting: Emergency Medicine

## 2022-05-09 ENCOUNTER — Emergency Department (HOSPITAL_BASED_OUTPATIENT_CLINIC_OR_DEPARTMENT_OTHER)
Admission: EM | Admit: 2022-05-09 | Discharge: 2022-05-09 | Discharge status: Against Medical Advice | Payer: PRIVATE HEALTH INSURANCE | Attending: Emergency Medicine | Admitting: Emergency Medicine

## 2022-05-09 ENCOUNTER — Other Ambulatory Visit: Payer: Self-pay

## 2022-05-09 DIAGNOSIS — R109 Unspecified abdominal pain: Secondary | ICD-10-CM | POA: Diagnosis present

## 2022-05-09 DIAGNOSIS — Z5321 Procedure and treatment not carried out due to patient leaving prior to being seen by health care provider: Secondary | ICD-10-CM | POA: Insufficient documentation

## 2022-05-09 DIAGNOSIS — H9202 Otalgia, left ear: Secondary | ICD-10-CM | POA: Insufficient documentation

## 2022-05-09 DIAGNOSIS — R1084 Generalized abdominal pain: Secondary | ICD-10-CM | POA: Insufficient documentation

## 2022-05-09 DIAGNOSIS — R110 Nausea: Secondary | ICD-10-CM | POA: Diagnosis not present

## 2022-05-09 LAB — CBC
HCT: 40.4 % (ref 36.0–46.0)
Hemoglobin: 12.9 g/dL (ref 12.0–15.0)
MCH: 28.9 pg (ref 26.0–34.0)
MCHC: 31.9 g/dL (ref 30.0–36.0)
MCV: 90.6 fL (ref 80.0–100.0)
Platelets: 260 10*3/uL (ref 150–400)
RBC: 4.46 MIL/uL (ref 3.87–5.11)
RDW: 12.4 % (ref 11.5–15.5)
WBC: 8.1 10*3/uL (ref 4.0–10.5)
nRBC: 0 % (ref 0.0–0.2)

## 2022-05-09 NOTE — ED Triage Notes (Signed)
Pt c/o burning pain "all over" abd area since 1300; tried Pepcid,Pepto Bismol and Copywriter, advertising; +nausea

## 2022-05-09 NOTE — ED Triage Notes (Signed)
Pt presents to ED POV. Pt c/o generalized abd pain and L otalgia. Pt reports that pain is cramping/burning, and intermittent. Pt reports that pain began around 1300 and keep worsening. Taken otc antiacids w/o relief.

## 2022-05-10 ENCOUNTER — Emergency Department (HOSPITAL_BASED_OUTPATIENT_CLINIC_OR_DEPARTMENT_OTHER)
Admission: EM | Admit: 2022-05-10 | Discharge: 2022-05-10 | Discharge status: Against Medical Advice | Payer: PRIVATE HEALTH INSURANCE | Attending: Emergency Medicine | Admitting: Emergency Medicine

## 2022-05-10 LAB — COMPREHENSIVE METABOLIC PANEL
ALT: 30 U/L (ref 0–44)
AST: 60 U/L — ABNORMAL HIGH (ref 15–41)
Albumin: 4.2 g/dL (ref 3.5–5.0)
Alkaline Phosphatase: 93 U/L (ref 38–126)
Anion gap: 8 (ref 5–15)
BUN: 16 mg/dL (ref 6–20)
CO2: 24 mmol/L (ref 22–32)
Calcium: 9.5 mg/dL (ref 8.9–10.3)
Chloride: 104 mmol/L (ref 98–111)
Creatinine, Ser: 0.94 mg/dL (ref 0.44–1.00)
GFR, Estimated: >60 mL/min (ref >60)
Glucose, Bld: 100 mg/dL — ABNORMAL HIGH (ref 70–99)
Potassium: 3.9 mmol/L (ref 3.5–5.1)
Sodium: 136 mmol/L (ref 135–145)
Total Bilirubin: 0.3 mg/dL (ref 0.3–1.2)
Total Protein: 7.5 g/dL (ref 6.5–8.1)

## 2022-05-10 LAB — LIPASE, BLOOD: Lipase: 62 U/L — ABNORMAL HIGH (ref 11–51)

## 2022-07-13 ENCOUNTER — Other Ambulatory Visit: Payer: Self-pay

## 2022-07-13 ENCOUNTER — Emergency Department (HOSPITAL_BASED_OUTPATIENT_CLINIC_OR_DEPARTMENT_OTHER): Payer: PRIVATE HEALTH INSURANCE

## 2022-07-13 ENCOUNTER — Encounter (HOSPITAL_BASED_OUTPATIENT_CLINIC_OR_DEPARTMENT_OTHER): Payer: Self-pay

## 2022-07-13 ENCOUNTER — Emergency Department (HOSPITAL_BASED_OUTPATIENT_CLINIC_OR_DEPARTMENT_OTHER)
Admission: EM | Admit: 2022-07-13 | Discharge: 2022-07-13 | Disposition: A | Discharge status: Home / Self Care | Payer: PRIVATE HEALTH INSURANCE | Attending: Emergency Medicine | Admitting: Emergency Medicine

## 2022-07-13 DIAGNOSIS — K5900 Constipation, unspecified: Secondary | ICD-10-CM

## 2022-07-13 DIAGNOSIS — R1084 Generalized abdominal pain: Secondary | ICD-10-CM | POA: Diagnosis present

## 2022-07-13 DIAGNOSIS — J45909 Unspecified asthma, uncomplicated: Secondary | ICD-10-CM | POA: Diagnosis not present

## 2022-07-13 LAB — COMPREHENSIVE METABOLIC PANEL
ALT: 12 U/L (ref 0–44)
AST: 16 U/L (ref 15–41)
Albumin: 4.2 g/dL (ref 3.5–5.0)
Alkaline Phosphatase: 84 U/L (ref 38–126)
Anion gap: 6 (ref 5–15)
BUN: 11 mg/dL (ref 6–20)
CO2: 23 mmol/L (ref 22–32)
Calcium: 8.8 mg/dL — ABNORMAL LOW (ref 8.9–10.3)
Chloride: 102 mmol/L (ref 98–111)
Creatinine, Ser: 0.9 mg/dL (ref 0.44–1.00)
GFR, Estimated: >60 mL/min (ref >60)
Glucose, Bld: 91 mg/dL (ref 70–99)
Potassium: 3.4 mmol/L — ABNORMAL LOW (ref 3.5–5.1)
Sodium: 131 mmol/L — ABNORMAL LOW (ref 135–145)
Total Bilirubin: 0.5 mg/dL (ref 0.3–1.2)
Total Protein: 8.1 g/dL (ref 6.5–8.1)

## 2022-07-13 LAB — CBC
HCT: 39.8 % (ref 36.0–46.0)
Hemoglobin: 12.8 g/dL (ref 12.0–15.0)
MCH: 28.8 pg (ref 26.0–34.0)
MCHC: 32.2 g/dL (ref 30.0–36.0)
MCV: 89.4 fL (ref 80.0–100.0)
Platelets: 207 10*3/uL (ref 150–400)
RBC: 4.45 MIL/uL (ref 3.87–5.11)
RDW: 11.9 % (ref 11.5–15.5)
WBC: 6.3 10*3/uL (ref 4.0–10.5)
nRBC: 0 % (ref 0.0–0.2)

## 2022-07-13 LAB — LIPASE, BLOOD: Lipase: 28 U/L (ref 11–51)

## 2022-07-13 LAB — PREGNANCY, URINE: Preg Test, Ur: NEGATIVE

## 2022-07-13 MED ORDER — IOHEXOL 300 MG/ML  SOLN
100.0000 mL | Freq: Once | INTRAMUSCULAR | Status: AC | PRN
  Administered 2022-07-13: 100 mL via INTRAVENOUS

## 2022-07-13 MED ORDER — ONDANSETRON 4 MG PO TBDP
4.0000 mg | ORAL_TABLET | Freq: Once | ORAL | Status: AC
  Administered 2022-07-13: 4 mg via ORAL
  Filled 2022-07-13: qty 1

## 2022-07-13 MED ORDER — ONDANSETRON HCL 4 MG PO TABS: 4.0000 mg | ORAL_TABLET | ORAL | 0 refills | Status: DC | PRN

## 2022-07-13 MED ORDER — MINERAL OIL RE ENEM
ENEMA | RECTAL | Status: AC
  Filled 2022-07-13: qty 1

## 2022-07-13 MED ORDER — ALUM & MAG HYDROXIDE-SIMETH 200-200-20 MG/5ML PO SUSP
30.0000 mL | Freq: Once | ORAL | Status: AC
  Administered 2022-07-13: 30 mL via ORAL
  Filled 2022-07-13: qty 30

## 2022-07-13 MED ORDER — MINERAL OIL RE ENEM
1.0000 | ENEMA | Freq: Once | RECTAL | Status: AC
  Administered 2022-07-13: 1 via RECTAL

## 2022-07-13 NOTE — ED Provider Notes (Signed)
[place] [facility]ERGENCY DEPARTMENT AT [facility] Provider Note  CSN: [id number] Arrival date & time: 07/13/22 1833  Chief Complaint(s) Abdominal Pain  HPI Stacey Weaver is a 47 y.o. female with past medical history as below, significant for anxiety, bipolar 1 disorder, migraines, HLD, MDD, depression, asthma who presents to the ED with complaint of constipation.  Patient reports that she had colonoscopy approximately 2 weeks ago, since then has not had a bowel movement.  She does report reduced p.o. intake over the past 2 weeks and increased bloating during that time.  Nausea without vomiting.  Reports irregular bowel movements in the past, frequent constipation.  She tried multiple over-the-counter laxatives and stool softeners without much relief of her symptoms.  She called her gastroenterologist for guidance and advised her to continue taking laxatives.  She reports abdominal bloating, no fevers or chills.  No change urination.  No melena or BRBPR.  Past Medical History Past Medical History:  Diagnosis Date   Anxiety    Asthma    Bipolar 1 disorder    Migraine    Patient Active Problem List   Diagnosis Date Noted   Neck pain 01/06/2017   Upper back pain 01/06/2017   Cervical radiculopathy 01/06/2017   Hyperlipidemia 01/29/2016   Severe episode of recurrent major depressive disorder, without psychotic features    Insomnia    MDD (major depressive disorder) 10/10/2015   Bipolar I disorder, most recent episode depressed    Feeling suicidal 10/09/2015   Rhabdomyolysis 08/08/2011   Antihistamines overdose 08/08/2011   SSRI overdose 08/07/2011   Depression 08/07/2011   Anxiety disorder 08/07/2011   BMI 35.0-35.9,adult 08/07/2011   Hypothyroidism 08/07/2011   Home Medication(s) Prior to Admission medications   Medication Sig Start Date End Date Taking? Authorizing Provider  ondansetron (ZOFRAN) 4 MG tablet Take 1 tablet (4 mg total) by mouth every 4 (four) hours  as needed for nausea or vomiting. 07/13/22  Yes Sloan Leiter, DO  ALPRAZolam Prudy Feeler) 0.25 MG tablet Take 0.25 mg by mouth at bedtime as needed for anxiety.     [provider]  carbamazepine (TEGRETOL XR) 200 MG 12 hr tablet Take 200 mg by mouth 2 (two) times daily. 06/28/21   [provider]  carbamazepine (TEGRETOL) 200 MG tablet Take 1 tablet (200 mg total) by mouth 2 (two) times daily. 10/14/15   Starkes-Perry, Juel Burrow, FNP  HYDROcodone-acetaminophen (NORCO/VICODIN) 5-325 MG tablet Take 1 tablet by mouth every 4 (four) hours as needed. 09/13/19   Jacalyn Lefevre, MD  ibuprofen (ADVIL) 800 MG tablet Take 1 tablet (800 mg total) by mouth 3 (three) times daily. 09/13/19   Jacalyn Lefevre, MD  lamoTRIgine (LAMICTAL) 25 MG tablet Take 1 tablet (25 mg total) by mouth 2 (two) times daily. 10/14/15   Starkes-Perry, Juel Burrow, FNP  methocarbamol (ROBAXIN) 500 MG tablet Take 1 tablet (500 mg total) by mouth 2 (two) times daily as needed for up to 15 doses for muscle spasms. 09/14/21   Terald Sleeper, MD  metoCLOPramide (REGLAN) 10 MG tablet Take 1 tablet (10 mg total) by mouth every 6 (six) hours. 11/21/17   McDonald, Mia A, PA-C  potassium chloride (KLOR-CON) 10 MEQ tablet Take 1 tablet (10 mEq total) by mouth daily for 30 doses. 09/14/21 10/14/21  Terald Sleeper, MD  prochlorperazine (COMPAZINE) 10 MG tablet Take 1 tablet (10 mg total) by mouth 2 (two) times daily as needed for nausea or vomiting. 09/24/17   Tegeler, Canary Brim,  MD  QUEtiapine (SEROQUEL) 200 MG tablet Take 200 mg by mouth 2 (two) times daily. 06/28/21   [provider]  sertraline (ZOLOFT) 50 MG tablet Take 50 mg by mouth 2 (two) times daily.    [provider]  temazepam (RESTORIL) 30 MG capsule Take 30 mg by mouth at bedtime as needed for sleep.    [provider]  traZODone (DESYREL) 100 MG tablet Take 3 tablets by mouth at bedtime.    [provider]                                                                                                                                     Past Surgical History Past Surgical History:  Procedure Laterality Date   KNEE SURGERY     Family History Family History  Problem Relation Age of Onset   Hyperlipidemia Mother    Hypertension Mother    Heart disease Father 58       CAD   Hyperlipidemia Maternal Grandmother    Cancer Maternal Grandmother    Cancer Maternal Grandfather    Hyperlipidemia Brother     Social History Social History   Tobacco Use   Smoking status: Never   Smokeless tobacco: Never  Substance Use Topics   Alcohol use: No   Drug use: No   Allergies Almotriptan malate, Cymbalta [duloxetine hcl], Imitrex [sumatriptan], Prednisone, Zomig, Geodon [ziprasidone hcl], Lactose intolerance (gi), Penicillins, and Zyprexa [olanzapine]  Review of Systems Review of Systems  Constitutional:  Negative for activity change and fever.  HENT:  Negative for facial swelling and trouble swallowing.   Eyes:  Negative for discharge and redness.  Respiratory:  Negative for cough and shortness of breath.   Cardiovascular:  Negative for chest pain and palpitations.  Gastrointestinal:  Positive for abdominal pain, constipation and nausea. Negative for vomiting.  Genitourinary:  Negative for dysuria and flank pain.  Musculoskeletal:  Negative for back pain and gait problem.  Skin:  Negative for pallor and rash.  Neurological:  Negative for syncope and headaches.    Physical Exam Vital Signs  I have reviewed the triage vital signs BP 110/71 (BP Location: Left Arm)   Pulse 84   Temp 98.4 F (36.9 C)   Resp 18   Ht 5\' 4"  (1.626 m)   Wt 82.6 kg   SpO2 100%   BMI 31.24 kg/m  Physical Exam Vitals and nursing note reviewed.  Constitutional:      General: She is not in acute distress.    Appearance: Normal appearance.  HENT:     Head: Normocephalic and atraumatic.     Right Ear: External ear normal.     Left Ear:  External ear normal.     Nose: Nose normal.     Mouth/Throat:     Mouth: Mucous membranes are moist.  Eyes:     General: No scleral icterus.  Right eye: No discharge.        Left eye: No discharge.  Cardiovascular:     Rate and Rhythm: Normal rate and regular rhythm.     Pulses: Normal pulses.     Heart sounds: Normal heart sounds.  Pulmonary:     Effort: Pulmonary effort is normal. No respiratory distress.     Breath sounds: Normal breath sounds.  Abdominal:     General: Abdomen is flat.     Palpations: Abdomen is soft.     Tenderness: There is no abdominal tenderness. There is no guarding or rebound.  Musculoskeletal:     Right lower leg: No edema.     Left lower leg: No edema.  Skin:    General: Skin is warm and dry.     Capillary Refill: Capillary refill takes less than 2 seconds.  Neurological:     Mental Status: She is alert.  Psychiatric:        Mood and Affect: Mood normal.        Behavior: Behavior normal.     ED Results and Treatments Labs (all labs ordered are listed, but only abnormal results are displayed) Labs Reviewed  COMPREHENSIVE METABOLIC PANEL - Abnormal; Notable for the following components:      Result Value   Sodium 131 (*)    Potassium 3.4 (*)    Calcium 8.8 (*)    All other components within normal limits  LIPASE, BLOOD  CBC  PREGNANCY, URINE                                                                                                                          Radiology CT ABDOMEN PELVIS W CONTRAST  Result Date: 07/13/2022 CLINICAL DATA:  Patient had a colonoscopy on 04/03 and has not had a bowel movement since. Nausea. Not passing gas. Abdominal pain. EXAM: CT ABDOMEN AND PELVIS WITH CONTRAST TECHNIQUE: Multidetector CT imaging of the abdomen and pelvis was performed using the standard protocol following bolus administration of intravenous contrast. RADIATION DOSE REDUCTION: This exam was performed according to the departmental  dose-optimization program which includes automated exposure control, adjustment of the mA and/or kV according to patient size and/or use of iterative reconstruction technique. CONTRAST:  OMNIPAQUE IOHEXOL 300 MG/ML  SOLN COMPARISON:  CT renal stone protocol 11/21/2017. FINDINGS: Lower chest: No acute abnormality. Hepatobiliary: Hepatic steatosis. Unremarkable gallbladder and biliary tree. Pancreas: Unremarkable. Spleen: Unremarkable. Adrenals/Urinary Tract: Normal adrenal glands. No urinary calculi or hydronephrosis. Unremarkable bladder. Stomach/Bowel: Normal caliber large and small bowel. No bowel wall thickening. Normal colonic stool load. Normal appendix. The stomach is within normal limits. Vascular/Lymphatic: No significant vascular findings are present. No enlarged abdominal or pelvic lymph nodes. Reproductive: IUD in the uterus.  No adnexal mass. Other: No free intraperitoneal fluid or air. Musculoskeletal: No acute osseous abnormality. IMPRESSION: No acute abnormality in the abdomen or pelvis. Electronically Signed   By: Minerva Fester M.D.   On: 07/13/2022 20:48  Pertinent labs & imaging results that were available during my care of the patient were reviewed by me and considered in my medical decision making (see MDM for details).  Medications Ordered in ED Medications  mineral oil enema 1 enema (has no administration in time range)  iohexol (OMNIPAQUE) 300 MG/ML solution 100 mL (100 mLs Intravenous Contrast Given 07/13/22 2023)  ondansetron (ZOFRAN-ODT) disintegrating tablet 4 mg (4 mg Oral Given 07/13/22 2139)  alum & mag hydroxide-simeth (MAALOX/MYLANTA) 200-200-20 MG/5ML suspension 30 mL (30 mLs Oral Given 07/13/22 2139)                                                                                                                                     Procedures Procedures  (including critical care time)  Medical Decision Making / ED Course    Medical Decision Making:     Alaine Tierre Gerard is a 47 y.o. female with past medical history as below, significant for anxiety, bipolar 1 disorder, migraines, HLD, MDD, depression, asthma who presents to the ED with complaint of constipation.. The complaint involves an extensive differential diagnosis and also carries with it a high risk of complications and morbidity.  Serious etiology was considered. Ddx includes but is not limited to: Bowel obstruction, chronic constipation, post colonoscopy complication,Differential diagnosis includes but is not exclusive to acute cholecystitis, intrathoracic causes for epigastric abdominal pain, gastritis, duodenitis, pancreatitis, small bowel or large bowel obstruction, abdominal aortic aneurysm, hernia, gastritis, etc.   Complete initial physical exam performed, notably the patient  was no acute distress, resting comfortably, abdomen is soft, nonperitoneal.    Reviewed and confirmed nursing documentation for past medical history, family history, social history.  Vital signs reviewed.     Patient with intermittent constipation, she has not had a bowel movement since her colonoscopy around 2 weeks ago.  He has been using over-the-counter laxatives and stool softeners without much relief.  She had some mild nausea but no vomiting.  Feels that her abdomen is bloated.  Labs reviewed and are stable.  CT imaging reviewed and are stable.  No evidence of obstruction.  Normal stool burden  Patient offered enema in the ED, she prefers to do the enema at home.  Given Zofran and GI cocktail which has improved her bloating and nausea symptoms.  She is able to tolerate p.o. intake without difficulty.  Dietary recommendations in regards to her recurrent constipation.  Outpatient management guidelines for constipation and outpatient follow-up instructions.  Advised her to follow-up with her gastroenterologist  The patient improved significantly and was discharged in stable condition. Detailed  discussions were had with the patient regarding current findings, and need for close f/u with PCP or on call doctor. The patient has been instructed to return immediately if the symptoms worsen in any way for re-evaluation. Patient verbalized understanding and is in agreement with current care plan. All questions answered prior to discharge.  Additional history obtained: -Additional history obtained from na -External records from outside source obtained and reviewed including: Chart review including previous notes, labs, imaging, consultation notes including medications, prior ED visits, primary care documentation, prior labs and imaging   Lab Tests: -I ordered, reviewed, and interpreted labs.   The pertinent results include:   Labs Reviewed  COMPREHENSIVE METABOLIC PANEL - Abnormal; Notable for the following components:      Result Value   Sodium 131 (*)    Potassium 3.4 (*)    Calcium 8.8 (*)    All other components within normal limits  LIPASE, BLOOD  CBC  PREGNANCY, URINE    Notable for as above stable  EKG   EKG Interpretation  Date/Time:    Ventricular Rate:    PR Interval:    QRS Duration:   QT Interval:    QTC Calculation:   R Axis:     Text Interpretation:           Imaging Studies ordered: I ordered imaging studies including CTAP I independently visualized the following imaging with scope of interpretation limited to determining acute life threatening conditions related to emergency care; findings noted above, significant for stable imaging, no sbo I independently visualized and interpreted imaging. I agree with the radiologist interpretation   Medicines ordered and prescription drug management: Meds ordered this encounter  Medications   iohexol (OMNIPAQUE) 300 MG/ML solution 100 mL   ondansetron (ZOFRAN-ODT) disintegrating tablet 4 mg   alum & mag hydroxide-simeth (MAALOX/MYLANTA) 200-200-20 MG/5ML suspension 30 mL   mineral oil enema 1 enema    ondansetron (ZOFRAN) 4 MG tablet    Sig: Take 1 tablet (4 mg total) by mouth every 4 (four) hours as needed for nausea or vomiting.    Dispense:  6 tablet    Refill:  0    -I have reviewed the patients home medicines and have made adjustments as needed   Consultations Obtained: a   Cardiac Monitoring: The patient was maintained on a cardiac monitor.  I personally viewed and interpreted the cardiac monitored which showed an underlying rhythm of: SR  Social Determinants of Health:  Diagnosis or treatment significantly limited by social determinants of health: na   Reevaluation: After the interventions noted above, I reevaluated the patient and found that they have improved  Co morbidities that complicate the patient evaluation  Past Medical History:  Diagnosis Date   Anxiety    Asthma    Bipolar 1 disorder    Migraine       Dispostion: Disposition decision including need for hospitalization was considered, and patient discharged from emergency department.    Final Clinical Impression(s) / ED Diagnoses Final diagnoses:  Constipation, unspecified constipation type  Generalized abdominal pain     This chart was dictated using voice recognition software.  Despite best efforts to proofread,  errors can occur which can change the documentation meaning.    Sloan Leiter, DO 07/13/22 2208

## 2022-07-13 NOTE — ED Notes (Signed)
Provided pt with warm blanket and updated on plan of care.

## 2022-07-13 NOTE — ED Notes (Signed)
Patient ambulatory to restroom without assistance. 

## 2022-07-13 NOTE — ED Triage Notes (Signed)
Pt states she had colonoscopy on 4/3, has not had bowel movement since. Has tried mirilax (2 bottles), mag citrate, dulcolax suppositories, black coffee, "everything I can think of, everything my doctor has recommended." Endorses associated abd pain, rectal pressure. States she is no longer passing gas, endorses "lots of nausea"

## 2022-07-13 NOTE — ED Notes (Signed)
Reviewed AVS with patient, patient expressed understanding of directions, denies further questions at this time. 

## 2022-07-13 NOTE — Discharge Instructions (Addendum)
Please follow up with your primary care doctor within 2-3 days. For constipation we also recommend a diet high in fiber (beans, fruits, vegetables, whole grains). Take Colace 100-200 mg up to three times per day. You may take along with Senokot 1-2 tabs, ingest with full glass of water.  You may also take MiraLAX 1-2 capfuls 1-2 times a day until stools become soft and then slowly decrease the amount of MiraLAX used.  Maintain fluid intake 6-8 glasses per day. Please increase fibers in your diet. You may also take Milk of Magnesia 30 mL as needed for constipation, you may repeat in 2 hours again if no bowl movement.  It was a pleasure caring for you today in the emergency department.  Please return to the emergency department for any worsening or worrisome symptoms.  

## 2022-08-11 ENCOUNTER — Emergency Department (HOSPITAL_BASED_OUTPATIENT_CLINIC_OR_DEPARTMENT_OTHER): Payer: PRIVATE HEALTH INSURANCE

## 2022-08-11 ENCOUNTER — Other Ambulatory Visit: Payer: Self-pay

## 2022-08-11 ENCOUNTER — Emergency Department (HOSPITAL_BASED_OUTPATIENT_CLINIC_OR_DEPARTMENT_OTHER)
Admission: EM | Admit: 2022-08-11 | Discharge: 2022-08-11 | Disposition: A | Discharge status: Home / Self Care | Payer: PRIVATE HEALTH INSURANCE | Attending: Emergency Medicine | Admitting: Emergency Medicine

## 2022-08-11 ENCOUNTER — Encounter (HOSPITAL_BASED_OUTPATIENT_CLINIC_OR_DEPARTMENT_OTHER): Payer: Self-pay | Admitting: Emergency Medicine

## 2022-08-11 DIAGNOSIS — R1033 Periumbilical pain: Secondary | ICD-10-CM | POA: Diagnosis present

## 2022-08-11 LAB — HCG, SERUM, QUALITATIVE: Preg, Serum: NEGATIVE

## 2022-08-11 LAB — CBC
HCT: 45.7 % (ref 36.0–46.0)
Hemoglobin: 14.6 g/dL (ref 12.0–15.0)
MCH: 29 pg (ref 26.0–34.0)
MCHC: 31.9 g/dL (ref 30.0–36.0)
MCV: 90.7 fL (ref 80.0–100.0)
Platelets: 266 10*3/uL (ref 150–400)
RBC: 5.04 MIL/uL (ref 3.87–5.11)
RDW: 12.5 % (ref 11.5–15.5)
WBC: 6.3 10*3/uL (ref 4.0–10.5)
nRBC: 0 % (ref 0.0–0.2)

## 2022-08-11 LAB — COMPREHENSIVE METABOLIC PANEL
ALT: 11 U/L (ref 0–44)
AST: 17 U/L (ref 15–41)
Albumin: 4.7 g/dL (ref 3.5–5.0)
Alkaline Phosphatase: 80 U/L (ref 38–126)
Anion gap: 8 (ref 5–15)
BUN: 13 mg/dL (ref 6–20)
CO2: 25 mmol/L (ref 22–32)
Calcium: 9.5 mg/dL (ref 8.9–10.3)
Chloride: 103 mmol/L (ref 98–111)
Creatinine, Ser: 0.99 mg/dL (ref 0.44–1.00)
GFR, Estimated: >60 mL/min (ref >60)
Glucose, Bld: 92 mg/dL (ref 70–99)
Potassium: 3.7 mmol/L (ref 3.5–5.1)
Sodium: 136 mmol/L (ref 135–145)
Total Bilirubin: 0.3 mg/dL (ref 0.3–1.2)
Total Protein: 8.5 g/dL — ABNORMAL HIGH (ref 6.5–8.1)

## 2022-08-11 LAB — HCG, QUANTITATIVE, PREGNANCY: hCG, Beta Chain, Quant, S: <1 m[IU]/mL (ref <5)

## 2022-08-11 LAB — LIPASE, BLOOD: Lipase: 23 U/L (ref 11–51)

## 2022-08-11 MED ORDER — FAMOTIDINE 20 MG PO TABS: 20.0000 mg | ORAL_TABLET | Freq: Two times a day (BID) | ORAL | 0 refills | Status: DC

## 2022-08-11 MED ORDER — DICYCLOMINE HCL 20 MG PO TABS: 20.0000 mg | ORAL_TABLET | Freq: Two times a day (BID) | ORAL | 0 refills | Status: DC

## 2022-08-11 MED ORDER — IOHEXOL 300 MG/ML  SOLN
100.0000 mL | Freq: Once | INTRAMUSCULAR | Status: AC | PRN
  Administered 2022-08-11: 80 mL via INTRAVENOUS

## 2022-08-11 MED ORDER — DICYCLOMINE HCL 10 MG/ML IM SOLN
20.0000 mg | Freq: Once | INTRAMUSCULAR | Status: AC
  Administered 2022-08-11: 20 mg via INTRAMUSCULAR
  Filled 2022-08-11: qty 2

## 2022-08-11 MED ORDER — MORPHINE SULFATE (PF) 4 MG/ML IV SOLN
4.0000 mg | Freq: Once | INTRAVENOUS | Status: DC
  Filled 2022-08-11: qty 1

## 2022-08-11 MED ORDER — ONDANSETRON HCL 4 MG/2ML IJ SOLN
4.0000 mg | Freq: Once | INTRAMUSCULAR | Status: AC
  Administered 2022-08-11: 4 mg via INTRAVENOUS
  Filled 2022-08-11: qty 2

## 2022-08-11 NOTE — ED Provider Notes (Signed)
[place] EMERGENCY DEPARTMENT AT [facility] Provider Note   CSN: [id number] Arrival date & time: 08/11/22  4098     History  Chief Complaint  Patient presents with   Abdominal Pain    Stacey Weaver is a 47 y.o. female.  Patient is a 47 year old female with a past medical history of bipolar disorder presenting to the emergency department with abdominal pain.  Patient states that she woke up this morning with periumbilical abdominal pain.  She states that she felt like she needed to have a bowel movement and went to the bathroom several times and only passed a small amount of stool.  She denies any black or bloody stools.  She states that she did have a fever this morning of 101 after which she took Tylenol.  She denies any nausea or vomiting, dysuria or hematuria, abnormal vaginal bleeding or discharge.  She states that she went to urgent care this morning and had urine studies performed and was told that her pregnancy test was positive.  She states that she does have the [place] IUD in for the past 2 years and does not get a period.  Also of note she did have a colonoscopy about a month ago that was normal that she had constipation with fecal impaction afterwards and has been started on MiraLAX and has been having normal bowel movements since then.  She states she had no abnormalities on her colonoscopy.  She states that she also recently saw her GYN and had normal STI screening and was recommended an ultrasound for postcoital bleeding.  The history is provided by the patient.  Abdominal Pain      Home Medications Prior to Admission medications   Medication Sig Start Date End Date Taking? Authorizing Provider  atorvastatin (LIPITOR) 10 MG tablet Take 10 mg by mouth daily. 04/29/22 04/29/23 Yes [provider]  dicyclomine (BENTYL) 20 MG tablet Take 1 tablet (20 mg total) by mouth 2 (two) times daily. 08/11/22  Yes Theresia Lo, Benetta Spar K, DO  famotidine (PEPCID) 20  MG tablet Take 1 tablet (20 mg total) by mouth 2 (two) times daily. 08/11/22  Yes Theresia Lo, [place] K, DO  metroNIDAZOLE (FLAGYL) 500 MG tablet Take 500 mg by mouth 2 (two) times daily. 07/28/22  Yes [provider]  metroNIDAZOLE (METROGEL) 0.75 % vaginal gel Place 1 Applicatorful vaginally 2 (two) times a week. 07/29/22 09/17/22 Yes [provider]  phentermine 15 MG capsule Take 15 mg by mouth daily. 07/19/22  Yes [provider]  WEGOVY 0.5 MG/0.5ML SOAJ Inject 0.5 mg into the skin once a week. 07/26/22  Yes [provider]  zolpidem (AMBIEN) 10 MG tablet Take 10 mg by mouth at bedtime. 06/08/22  Yes [provider]  AIMOVIG 140 MG/ML SOAJ Inject into the skin.    [provider]  ALPRAZolam Prudy Feeler) 0.25 MG tablet Take 0.25 mg by mouth at bedtime as needed for anxiety.     [provider]  carbamazepine (TEGRETOL XR) 200 MG 12 hr tablet Take 200 mg by mouth 2 (two) times daily. 06/28/21   [provider]  carbamazepine (TEGRETOL) 200 MG tablet Take 1 tablet (200 mg total) by mouth 2 (two) times daily. 10/14/15   Starkes-Perry, Juel Burrow, FNP  clonazePAM (KLONOPIN) 0.5 MG tablet Take by mouth.    [provider]  HYDROcodone-acetaminophen (NORCO/VICODIN) 5-325 MG tablet Take 1 tablet by mouth every 4 (four) hours as needed. 09/13/19   Jacalyn Lefevre, MD  ibuprofen (  ADVIL) 800 MG tablet Take 1 tablet (800 mg total) by mouth 3 (three) times daily. 09/13/19   Jacalyn Lefevre, MD  lamoTRIgine (LAMICTAL) 25 MG tablet Take 1 tablet (25 mg total) by mouth 2 (two) times daily. 10/14/15   Starkes-Perry, Juel Burrow, FNP  methocarbamol (ROBAXIN) 500 MG tablet Take 1 tablet (500 mg total) by mouth 2 (two) times daily as needed for up to 15 doses for muscle spasms. 09/14/21   Terald Sleeper, MD  metoCLOPramide (REGLAN) 10 MG tablet Take 1 tablet (10 mg total) by mouth every 6 (six) hours. 11/21/17   McDonald, Mia A, PA-C  ondansetron (ZOFRAN) 4 MG  tablet Take 1 tablet (4 mg total) by mouth every 4 (four) hours as needed for nausea or vomiting. 07/13/22   Sloan Leiter, DO  potassium chloride (KLOR-CON) 10 MEQ tablet Take 1 tablet (10 mEq total) by mouth daily for 30 doses. 09/14/21 10/14/21  Terald Sleeper, MD  prochlorperazine (COMPAZINE) 10 MG tablet Take 1 tablet (10 mg total) by mouth 2 (two) times daily as needed for nausea or vomiting. 09/24/17   Tegeler, Canary Brim, MD  QUEtiapine (SEROQUEL) 200 MG tablet Take 200 mg by mouth 2 (two) times daily. 06/28/21   [provider]  sertraline (ZOLOFT) 50 MG tablet Take 50 mg by mouth 2 (two) times daily.    [provider]  temazepam (RESTORIL) 30 MG capsule Take 30 mg by mouth at bedtime as needed for sleep.    [provider]  traZODone (DESYREL) 100 MG tablet Take 3 tablets by mouth at bedtime.    [provider]  UBRELVY 100 MG TABS Take by mouth.    [provider]      Allergies    Almotriptan malate, Cymbalta [duloxetine hcl], Imitrex [sumatriptan], Prednisone, Zomig, Geodon [ziprasidone hcl], Lactose intolerance (gi), Penicillins, and Zyprexa [olanzapine]    Review of Systems   Review of Systems  Gastrointestinal:  Positive for abdominal pain.    Physical Exam Updated Vital Signs BP 103/69   Pulse 79   Temp 97.8 F (36.6 C) (Oral)   Resp 16   Ht 5\' 4"  (1.626 m)   Wt 83.5 kg   SpO2 100%   BMI 31.58 kg/m  Physical Exam Vitals and nursing note reviewed.  Constitutional:      General: She is not in acute distress.    Appearance: She is well-developed.  HENT:     Head: Normocephalic and atraumatic.     Mouth/Throat:     Mouth: Mucous membranes are moist.  Eyes:     Extraocular Movements: Extraocular movements intact.  Cardiovascular:     Rate and Rhythm: Normal rate and regular rhythm.     Heart sounds: Normal heart sounds.  Pulmonary:     Effort: Pulmonary effort is normal.     Breath sounds: Normal breath sounds.   Abdominal:     General: Abdomen is flat.     Palpations: Abdomen is soft.     Tenderness: There is no abdominal tenderness. There is no guarding or rebound.  Skin:    General: Skin is warm and dry.  Neurological:     General: No focal deficit present.     Mental Status: She is alert and oriented to person, place, and time.  Psychiatric:        Mood and Affect: Mood normal.        Behavior: Behavior normal.     ED Results / Procedures /  Treatments   Labs (all labs ordered are listed, but only abnormal results are displayed) Labs Reviewed  COMPREHENSIVE METABOLIC PANEL - Abnormal; Notable for the following components:      Result Value   Total Protein 8.5 (*)    All other components within normal limits  LIPASE, BLOOD  CBC  HCG, QUANTITATIVE, PREGNANCY  HCG, SERUM, QUALITATIVE    EKG None  Radiology CT ABDOMEN PELVIS W CONTRAST  Result Date: 08/11/2022 CLINICAL DATA:  Acute abdominal pain. Nausea vomiting and dizziness. EXAM: CT ABDOMEN AND PELVIS WITH CONTRAST TECHNIQUE: Multidetector CT imaging of the abdomen and pelvis was performed using the standard protocol following bolus administration of intravenous contrast. RADIATION DOSE REDUCTION: This exam was performed according to the departmental dose-optimization program which includes automated exposure control, adjustment of the mA and/or kV according to patient size and/or use of iterative reconstruction technique. CONTRAST:  80mL OMNIPAQUE IOHEXOL 300 MG/ML  SOLN COMPARISON:  CT examination dated July 13, 2022 FINDINGS: Lower chest: No acute abnormality. Hepatobiliary: No focal liver abnormality is seen. No gallstones, gallbladder wall thickening, or biliary dilatation. Pancreas: Unremarkable. No pancreatic ductal dilatation or surrounding inflammatory changes. Spleen: Normal in size without focal abnormality. Adrenals/Urinary Tract: Adrenal glands are unremarkable. Kidneys are normal, without renal calculi, focal lesion, or  hydronephrosis. Bladder is unremarkable. Stomach/Bowel: Stomach is within normal limits. Appendix appears normal. No evidence of bowel wall thickening, distention, or inflammatory changes. Vascular/Lymphatic: No significant vascular findings are present. No enlarged abdominal or pelvic lymph nodes. Reproductive: Intrauterine contraceptive device in satisfactory position. Uterus and bilateral adnexa are unremarkable. Other: No abdominal wall hernia or abnormality. No abdominopelvic ascites. Musculoskeletal: No acute or significant osseous findings. IMPRESSION: 1. No CT evidence of acute abdominal/pelvic process. 2. Intrauterine contraceptive device in satisfactory position. Electronically Signed   By: Larose Hires D.O.   On: 08/11/2022 10:03    Procedures Procedures    Medications Ordered in ED Medications  ondansetron (ZOFRAN) injection 4 mg (4 mg Intravenous Given 08/11/22 0926)  iohexol (OMNIPAQUE) 300 MG/ML solution 100 mL (80 mLs Intravenous Contrast Given 08/11/22 0931)  dicyclomine (BENTYL) injection 20 mg (20 mg Intramuscular Given 08/11/22 0948)    ED Course/ Medical Decision Making/ A&P Clinical Course as of 08/11/22 1027  Wed Aug 11, 2022  0900 Pregnancy test here negative, labs within normal range. [VK]  [id number] Patient continued to endorse significant pain, abd remains soft. Will have CTAP to further evaluate cause of pain. [VK]  1007 No abnormalities within CT. Likely viral etiology of pain and recommended continued symptomatic management. [VK]    Clinical Course User Index [VK] Rexford Maus, DO                             Medical Decision Making This patient presents to the ED with chief complaint(s) of abdominal pain with pertinent past medical history of bipolar which further complicates the presenting complaint. The complaint involves an extensive differential diagnosis and also carries with it a high risk of complications and morbidity.    The differential diagnosis  includes pregnancy, ectopic, UTI less likely as UA was negative at urgent care, constipation, diverticulitis less likely as no diverticuli colonoscopy per the patient, noting appendicitis or other intra-abdominal infection, gastroenteritis  Additional history obtained: Additional history obtained from N/A Records reviewed outpatient GI and urgent care records  ED Course and Reassessment: On patient's arrival to the emergency department she is hemodynamically stable in no  acute distress.  Her abdomen is minimally tender without rebound or guarding and she is afebrile here.  Patient will have labs including hCG quant, LFTs and lipase to evaluate for cause of her pain.  She did take Tylenol prior to arrival and further pain management will be held at this time pending pregnancy test she will be closely reassessed.  Independent labs interpretation:  The following labs were independently interpreted: within normal range  Independent visualization of imaging: - I independently visualized the following imaging with scope of interpretation limited to determining acute life threatening conditions related to emergency care: CTAP, which revealed no acute disease  Consultation: - Consulted or discussed management/test interpretation w/ external professional: N/A  Consideration for admission or further workup: Patient has no emergent conditions requiring admission or further work-up at this time and is stable for discharge home with primary care follow-up  Social Determinants of health: N/A    Amount and/or Complexity of Data Reviewed Labs: ordered. Radiology: ordered.  Risk Prescription drug management.           Final Clinical Impression(s) / ED Diagnoses Final diagnoses:  Periumbilical pain    Rx / DC Orders ED Discharge Orders          Ordered    dicyclomine (BENTYL) 20 MG tablet  2 times daily        08/11/22 1026    famotidine (PEPCID) 20 MG tablet  2 times daily         08/11/22 1026              [place], [place] K, DO 08/11/22 1027

## 2022-08-11 NOTE — Discharge Instructions (Signed)
You were seen in the emergency department for your abdominal pain.  Your pregnancy test here was negative and your CAT scan showed no obvious cause of your pain.  You likely have a viral infection and you can continue to take your home with Phenergan for your nausea as well as Tylenol, Bentyl and Pepcid for your abdominal pain.  You can follow-up with your primary doctor in the next few days to have your symptoms rechecked.  You should return to the emergency department for significantly worsening pain, multiple days of fevers, repetitive vomiting despite your nausea medication or any other new or concerning symptoms.

## 2022-08-11 NOTE — ED Notes (Signed)
ED Provider at bedside. 

## 2022-08-11 NOTE — ED Notes (Signed)
Patient verbalizes understanding of discharge instructions. Opportunity for questioning and answers were provided. Patient discharged from ED.  °

## 2022-08-11 NOTE — ED Triage Notes (Signed)
Pt arrives to ED with c/o umbilical abdominal pain, nausea, vomiting, and dizziness that started at 3am this morning. She notes she went to UC and had a positive pregnancy test, she does have an IUD.

## 2022-08-11 NOTE — ED Notes (Signed)
Patient transported to CT 

## 2022-12-06 ENCOUNTER — Ambulatory Visit (HOSPITAL_COMMUNITY)
Admission: EM | Admit: 2022-12-06 | Discharge: 2022-12-06 | Disposition: A | Discharge status: Home / Self Care | Payer: PRIVATE HEALTH INSURANCE

## 2022-12-06 DIAGNOSIS — F332 Major depressive disorder, recurrent severe without psychotic features: Secondary | ICD-10-CM | POA: Diagnosis not present

## 2022-12-06 NOTE — Discharge Instructions (Addendum)

## 2022-12-06 NOTE — Progress Notes (Signed)
   12/06/22 1504  [facility] Triage Screening (Walk-ins at [facility] only)  How Did You Hear About [place]? Self  What Is the Reason for Your Visit/Call Today? Pt presents to [facility] accompanied by her husband. Pt reports that she is dealing with significant depression and PTSD. Pt reports she is not eating or sleeping. Pt states, "I cannot gather my thoughts and I feel very depressed". Pt reports she has not slept well due to the "visions" she has. Pt reports she is getting 2 hours of sleep per night. Pt states she hasnt had any "good" sleep for 2 weeks. Pt reports that she sees her dead mother in her dreams. Pt also reports she hears her grandmother gasping for air. Pt reports taking trazodone, zoloft, and a few other medications to help with her anxiety and depression. Pt reports she has not eaten in several days and does not feel hungry. Pt reports that she is unable to focus at work due to her ongoing situation. Pt reports she had grief counseling on 8/18 and believes this triggered her ability to focus and sleep. Pt states, "after the grief counseling I had a hard time focusing at work and I couldnt eat or sleep". Pt denies substance use, SI and HI.  How Long Has This Been Causing You Problems? <Week  Have You Recently Had Any Thoughts About Hurting Yourself? No  Are You Planning to Commit Suicide/Harm Yourself At This time? No  Have you Recently Had Thoughts About Hurting Someone Karolee Ohs? No  Are You Planning To Harm Someone At This Time? No  Are you currently experiencing any auditory, visual or other hallucinations? Yes  Please explain the hallucinations you are currently experiencing: Pt reports she hears grandmother gasping for air, sees her dead mother  Have You Used Any Alcohol or Drugs in the Past 24 Hours? No  Do you have any current medical co-morbidities that require immediate attention? No  Clinician description of patient physical appearance/behavior: cooperative, calm, well groomed  What Do You Feel  Would Help You the Most Today? Medication(s);Stress Management  If access to [facility] was not available, would you have sought care in the Emergency Department? No  Determination of Need Urgent (48 hours)  Options For Referral Outpatient Therapy;Medication Management

## 2022-12-06 NOTE — ED Provider Notes (Cosign Needed Addendum)
Behavioral Health Urgent Care Medical Screening Exam  Patient Name: Stacey Weaver MRN: [id number] Date of Evaluation: 12/06/22 Chief Complaint:  Increased depression and decreased sleep. Diagnosis:  Final diagnoses:  Severe episode of recurrent major depressive disorder, without psychotic features (HCC)    History of Present illness: Stacey Weaver is a 47 y.o. female patient presented to [facility] as a walk in unaccompanied with complaints of increased depression and decreased sleep.  [name], 47 y.o., female patient seen face to face by this provider, consulted with Dr. Lucianne Muss; and chart reviewed on 12/06/22.   Patient reports she has been diagnosed with bipolar disorder, depression, PTSD and anxiety.  She is followed by [facility]uss B[facility]  Her next appointment is December 15, 2022.  She is prescribed Lamictal 100 mg daily, trazodone 300 mg nightly, Zoloft 100 mg daily, Tegretol 200 mg daily, and Klonopin 1 mg twice daily as needed, and Lipitor.  She lives alone.  She is employed full-time.  She denies all substance use.  On evaluation Stacey Weaver reports she lost her mother in 2021 and her grandmother in 2024.  Reports she feels as though she has PTSD related to those losses.  States she does not sleep well due to the patient states she has.  She keeps thinking back and visulaizing her grandmother gasping for air as she was passing.  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  Today while she was at work her supervisor called her in the office because they noticed that she was having a difficult time.  She expressed to them how she was feeling and they told her to go home for the rest of today.  She contacted [facility] and they are unable to see her until September 18.  They sent the mobile crisis out to visit  patient.  After talking with her and due to her increased depression and being unable to obtain a medication management appointment they recommended she come to [facility] for medication adjustments.  During evaluation [name] is observed sitting in the assessment room in no acute distress.  She is casually dressed and makes good eye contact.  She is alert/oriented x 4, calm, cooperative, and attentive.  She has normal speech and behavior.  She endorses an increase in her depression and anxiety with feelings of sadness, tearfulness, decreased motivation, decreased focus, decreased appetite and sleep.  Reports she sleeps only 2 hours at a time.  She has a depressed affect.  She identifies the loss of her mother and grandmother as her contributing factors.  She reports having a fantastic support system in her life.  She denies suicidal ideation/homicidal ideations.  She denies auditory/visual hallucinations.  She does not appear psychotic, manic, or paranoid.  She does not appear to be responding to internal/external stimuli.  She is able to answer questions appropriately.  Discussed with patient to follow-up with [facility] and  that medication changes should not be made in the crisis setting.  Discussed meditation and different coping skills to help with depression and anxiety.  Patient is currently prescribed Klonopin and states she cannot take Klonopin twice a day due to it making her sleepy.  Discussed with patient to try taking Klonopin at night instead of the trazodone to see if it would assist in helping her  to get to sleep.  Also discussed PHP program with patient she may be interested.  She is willing to have a referral be made and to have PHP program contact her.  Flowsheet Row ED from 12/06/2022 in [facility] ED from 08/11/2022 in [facility] Emergency Department at [facility] ED from 05/10/2022 in [facility] Emergency Department at [facility]  C-SSRS RISK CATEGORY No Risk No Risk No Risk       Psychiatric Specialty Exam  Presentation  General Appearance:Appropriate for Environment; Casual  Eye Contact:Good  Speech:Clear and Coherent; Normal Rate  Speech Volume:Normal  Handedness:No data recorded  Mood and Affect  Mood: Anxious; Depressed  Affect: Congruent   Thought Process  Thought Processes: Coherent  Descriptions of Associations:Intact  Orientation:Full (Time, Place and Person)  Thought Content:Logical    Hallucinations:None  Ideas of Reference:None  Suicidal Thoughts:No  Homicidal Thoughts:No   Sensorium  Memory: Immediate Good; Recent Good; Remote Good  Judgment: Good  Insight: Good   Executive Functions  Concentration: Good  Attention Span: Good  Recall: Good  Fund of Knowledge: Good  Language: Good   Psychomotor Activity  Psychomotor Activity: Normal   Assets  Assets: Communication Skills; Desire for Improvement; Financial Resources/Insurance; Housing; Physical Health; Resilience; Social Support; Vocational/Educational   Sleep  Sleep: Poor  Number of hours:  0   Physical Exam: Physical Exam Vitals and nursing note reviewed.  Constitutional:      General: She is not in acute distress.    Appearance: She is well-developed.  Eyes:     General:        Right eye: No discharge.        Left eye: No discharge.  Cardiovascular:     Rate and Rhythm: Normal rate.  Pulmonary:     Effort: Pulmonary effort is normal. No respiratory distress.  Musculoskeletal:        General: Normal range of motion.     Cervical back: Normal range of motion.  Skin:    Coloration: Skin is not jaundiced or pale.  Neurological:     Mental Status: She is alert and oriented to person, place, and time.  Psychiatric:        Attention and Perception: Attention and perception normal.        Mood and Affect: Mood is anxious and depressed.        Speech: Speech normal.         Behavior: Behavior normal. Behavior is cooperative.        Thought Content: Thought content normal.        Cognition and Memory: Cognition normal.        Judgment: Judgment normal.    Review of Systems  Constitutional: Negative.   HENT: Negative.    Eyes: Negative.   Respiratory: Negative.    Cardiovascular: Negative.   Musculoskeletal: Negative.   Skin: Negative.   Neurological: Negative.   Psychiatric/Behavioral:  Positive for depression. The patient is nervous/anxious.    Blood pressure 117/74, pulse 88, temperature 98.5 F (36.9 C), temperature source Oral, resp. rate 19, SpO2 100%. There is no height or weight on file to calculate BMI.  Musculoskeletal: Strength & Muscle Tone: within normal limits Gait & Station: normal Patient leans: N/A   [facility] MSE Discharge Disposition for Follow up and Recommendations: Based on my evaluation the patient does not appear to have an emergency medical condition and can be discharged with resources and follow up care in outpatient services  for Medication Management, Partial Hospitalization Program, and Individual Therapy   Discharge patient  Please follow-up with current provider [facility]-Dr. Challa  Referral made to [facility] PHP program  Ardis Hughs, NP 12/06/2022, 4:30 PM

## 2022-12-07 ENCOUNTER — Telehealth (HOSPITAL_COMMUNITY): Payer: Self-pay | Admitting: Professional

## 2022-12-07 NOTE — Telephone Encounter (Signed)
Cln rtn pt call. Pt reports insurance rep told her she would have to pay $144/day and $800 up front to [facility] PHP before participating. Cln explains billing process. Ins rep mentioned RAV code 912/913 after pt provided CPT code, this cln is unaware of RAV codes. Pt reports she feels better knowing she will not pay anything each day she shows to group and will be billed after services are rendered. Pt wants to move fwd with [facility].

## 2022-12-08 ENCOUNTER — Telehealth (HOSPITAL_COMMUNITY): Payer: Self-pay | Admitting: Professional

## 2022-12-09 ENCOUNTER — Other Ambulatory Visit (HOSPITAL_COMMUNITY): Payer: PRIVATE HEALTH INSURANCE | Attending: Psychiatry | Admitting: Professional

## 2022-12-09 DIAGNOSIS — F4381 Prolonged grief disorder: Secondary | ICD-10-CM | POA: Insufficient documentation

## 2022-12-09 DIAGNOSIS — F32A Depression, unspecified: Secondary | ICD-10-CM | POA: Diagnosis not present

## 2022-12-09 DIAGNOSIS — F419 Anxiety disorder, unspecified: Secondary | ICD-10-CM | POA: Diagnosis not present

## 2022-12-09 DIAGNOSIS — F3130 Bipolar disorder, current episode depressed, mild or moderate severity, unspecified: Secondary | ICD-10-CM

## 2022-12-10 ENCOUNTER — Other Ambulatory Visit (HOSPITAL_COMMUNITY): Payer: No Typology Code available for payment source

## 2022-12-13 ENCOUNTER — Other Ambulatory Visit (HOSPITAL_COMMUNITY): Payer: No Typology Code available for payment source
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
## 2022-12-14 ENCOUNTER — Other Ambulatory Visit (HOSPITAL_COMMUNITY): Payer: No Typology Code available for payment source

## 2022-12-15 ENCOUNTER — Other Ambulatory Visit (HOSPITAL_COMMUNITY): Payer: No Typology Code available for payment source

## 2022-12-15 DIAGNOSIS — F319 Bipolar disorder, unspecified: Secondary | ICD-10-CM | POA: Insufficient documentation

## 2022-12-15 DIAGNOSIS — R4589 Other symptoms and signs involving emotional state: Secondary | ICD-10-CM | POA: Insufficient documentation

## 2022-12-15 DIAGNOSIS — F3130 Bipolar disorder, current episode depressed, mild or moderate severity, unspecified: Secondary | ICD-10-CM | POA: Insufficient documentation

## 2022-12-15 DIAGNOSIS — F4381 Prolonged grief disorder: Secondary | ICD-10-CM | POA: Insufficient documentation

## 2022-12-15 DIAGNOSIS — Z79899 Other long term (current) drug therapy: Secondary | ICD-10-CM | POA: Insufficient documentation

## 2022-12-15 DIAGNOSIS — F411 Generalized anxiety disorder: Secondary | ICD-10-CM | POA: Insufficient documentation

## 2022-12-15 DIAGNOSIS — F4310 Post-traumatic stress disorder, unspecified: Secondary | ICD-10-CM | POA: Insufficient documentation

## 2022-12-15 DIAGNOSIS — R440 Auditory hallucinations: Secondary | ICD-10-CM | POA: Insufficient documentation

## 2022-12-16 ENCOUNTER — Other Ambulatory Visit (HOSPITAL_COMMUNITY)
Payer: No Typology Code available for payment source | Attending: Psychiatry | Admitting: Licensed Clinical Social Worker

## 2022-12-16 ENCOUNTER — Other Ambulatory Visit (HOSPITAL_COMMUNITY): Payer: No Typology Code available for payment source

## 2022-12-16 DIAGNOSIS — F411 Generalized anxiety disorder: Secondary | ICD-10-CM | POA: Diagnosis not present

## 2022-12-16 DIAGNOSIS — F4381 Prolonged grief disorder: Secondary | ICD-10-CM | POA: Diagnosis not present

## 2022-12-16 DIAGNOSIS — F319 Bipolar disorder, unspecified: Secondary | ICD-10-CM | POA: Diagnosis not present

## 2022-12-16 DIAGNOSIS — F3130 Bipolar disorder, current episode depressed, mild or moderate severity, unspecified: Secondary | ICD-10-CM | POA: Diagnosis present

## 2022-12-16 DIAGNOSIS — R4589 Other symptoms and signs involving emotional state: Secondary | ICD-10-CM

## 2022-12-16 DIAGNOSIS — Z79899 Other long term (current) drug therapy: Secondary | ICD-10-CM | POA: Diagnosis not present

## 2022-12-16 DIAGNOSIS — F4310 Post-traumatic stress disorder, unspecified: Secondary | ICD-10-CM | POA: Diagnosis not present

## 2022-12-16 DIAGNOSIS — R440 Auditory hallucinations: Secondary | ICD-10-CM | POA: Diagnosis not present

## 2022-12-17 ENCOUNTER — Encounter (HOSPITAL_COMMUNITY): Payer: Self-pay

## 2022-12-17 ENCOUNTER — Other Ambulatory Visit (HOSPITAL_COMMUNITY): Payer: No Typology Code available for payment source | Admitting: Licensed Clinical Social Worker

## 2022-12-17 ENCOUNTER — Encounter (HOSPITAL_COMMUNITY): Payer: Self-pay | Admitting: Family

## 2022-12-17 ENCOUNTER — Other Ambulatory Visit (HOSPITAL_COMMUNITY): Payer: No Typology Code available for payment source

## 2022-12-17 DIAGNOSIS — F4381 Prolonged grief disorder: Secondary | ICD-10-CM

## 2022-12-17 DIAGNOSIS — R4589 Other symptoms and signs involving emotional state: Secondary | ICD-10-CM

## 2022-12-17 DIAGNOSIS — F3130 Bipolar disorder, current episode depressed, mild or moderate severity, unspecified: Secondary | ICD-10-CM | POA: Diagnosis not present

## 2022-12-17 MED ORDER — ZOLPIDEM TARTRATE ER 6.25 MG PO TBCR: 6.2500 mg | EXTENDED_RELEASE_TABLET | Freq: Every evening | ORAL | 0 refills | Status: DC | PRN

## 2022-12-17 NOTE — Addendum Note (Signed)
Addended by: Ted Mcalpine on: 12/17/2022 09:42 AM   Modules accepted: Orders

## 2022-12-17 NOTE — Progress Notes (Addendum)
Virtual Visit via Video Note  I connected with Stacey Weaver on 12/17/22 at  9:00 AM EDT by a video enabled telemedicine application and verified that I am speaking with the correct person using two identifiers.  Location: Patient: Home Provider: Office   I discussed the limitations of evaluation and management by telemedicine and the availability of in person appointments. The patient expressed understanding and agreed to proceed.    I discussed the assessment and treatment plan with the patient. The patient was provided an opportunity to ask questions and all were answered. The patient agreed with the plan and demonstrated an understanding of the instructions.   The patient was advised to call back or seek an in-person evaluation if the symptoms worsen or if the condition fails to improve as anticipated.  I provided 15 minutes of non-face-to-face time during this encounter.   [name], NP     Psychiatric Initial Adult Assessment   Patient Identification: Stacey Weaver MRN:  [id number] Date of Evaluation:  12/17/2022 Referral Source: [facility] urgent care Chief Complaint:  No chief complaint on file.  Visit Diagnosis:    ICD-10-CM   1. Bipolar I disorder, most recent episode depressed (HCC)  F31.30     2. Prolonged grief disorder  F43.81       History of Present Illness:  Stacey Weaver 47 year old African-American female is seen via video assessment.  She was referred by [facility] urgent care walk-in clinic.  States she was struggling with grief and loss related to the passing of her grandmother and mother.  She states her mother passed away in Dec 29, 2019 and her maternal grandmother passed away in December 28, 2021.  States she was the caregiver for both of them with the healthcare power of attorney.  States she continues to second-guess herself related to the endings of their lives.  States she has ongoing nightmares and night terrors related to some of the decisions  that she has made during the death process.  States ongoing grief, feelings of guilt and mood irritability.  She reports she is followed by Dr.Challa through [facility].  States she is currently diagnosed with bipolar disorder, generalized anxiety disorder, posttraumatic stress disorder, and prolonged grief disorder.  States she is currently prescribed Lamictal, trazodone, Zoloft, Tegretol, Klonopin and Ambien.  She reports she has tried multiple medications in the past to help stabilize her mood.  She reports previous inpatient admissions.  She reports history of suicidal ideations and attempts.  States her last hospitalization was 5 to 6 years ago.  She states more recently she has not been able to sleep well.  Reports racing thoughts, " my brain is on overload."  Does report auditory hallucinations.  States " I hear my mom a lot."  However would not classify that as an hallucinations more of my subconscious.  Stacey Weaver denied illicit drug use or substance abuse history.  She reports she has a good support system.  States she and her oldest brother are fairly close. Does report family history related to mental illness: States a couple of her aunts on her father side diagnosed with bipolar paranoid schizophrenia and anxiety.  She states on her mother side she has a couple of uncles passed away from substance abuse drug overdose.  Patient to start partial hospitalization programming on 12/16/2022.  During evaluation Stacey Weaver is sitting in bed;she is alert/oriented x 4; calm/cooperative; and mood congruent with affect.  Patient is speaking in a clear tone at  moderate volume, and normal pace; with good eye contact.Her thought process is coherent and relevant; There is no indication that she is currently responding to internal/external stimuli or experiencing delusional thought content.  Patient denies suicidal/self-harm/homicidal ideation, psychosis, and paranoia.  Patient has remained calm  throughout assessment and has answered questions appropriately.    Associated Signs/Symptoms: Depression Symptoms:  depressed mood, insomnia, difficulty concentrating, (Hypo) Manic Symptoms:  Distractibility, Irritable Mood, Anxiety Symptoms:  Excessive Worry, Psychotic Symptoms:  Hallucinations: None PTSD Symptoms: Had a traumatic exposure:  Decision making related to end-of-life decisions for her mother and grandmother  Past Psychiatric History: See HPI  Previous Psychotropic Medications: Yes she reports she has tried multiple medications in the past to include Remeron, Zyprexa, Geodon, Seroquel states she does not do well with new medications.  And would like to continue her current medication regimen  Substance Abuse History in the last 12 months:  No.  Consequences of Substance Abuse: NA  Past Medical History:  Past Medical History:  Diagnosis Date   Anxiety    Asthma    Bipolar 1 disorder (HCC)    Migraine     Past Surgical History:  Procedure Laterality Date   KNEE SURGERY      Family Psychiatric History: See HPI  Family History:  Family History  Problem Relation Age of Onset   Hyperlipidemia Mother    Hypertension Mother    Heart disease Father 80       CAD   Hyperlipidemia Maternal Grandmother    Cancer Maternal Grandmother    Cancer Maternal Grandfather    Hyperlipidemia Brother     Social History:   Social History   Socioeconomic History   Marital status: Single    Spouse name: Not on file   Number of children: Not on file   Years of education: Not on file   Highest education level: Not on file  Occupational History   Not on file  Tobacco Use   Smoking status: Never   Smokeless tobacco: Never  Substance and Sexual Activity   Alcohol use: No   Drug use: No   Sexual activity: Yes    Birth control/protection: I.U.D.  Other Topics Concern   Not on file  Social History Narrative   Not on file   Social Determinants of Health    Financial Resource Strain: Not on file  Food Insecurity: Medium Risk (11/04/2022)   Received from [facility]   Hunger Vital Sign    Worried About Running Out of Food in the Last Year: Sometimes true    Ran Out of Food in the Last Year: Sometimes true  Transportation Needs: Not on file (11/04/2022)  Physical Activity: Not on file  Stress: Not on file  Social Connections: Unknown (08/04/2021)   [facility]   Social Network    Social Network: Not on file    Additional Social History:   Allergies:   Allergies  Allergen Reactions   Almotriptan Malate Shortness Of Breath and Other (See Comments)    Muscle spasms   Cymbalta [Duloxetine Hcl] Other (See Comments)    Other reaction(s): Other Muscle spasms suicidal thoughts   Imitrex [Sumatriptan] Shortness Of Breath, Other (See Comments) and Palpitations    Muscle spasms   Prednisone Other (See Comments) and Palpitations   Zomig Shortness Of Breath   Geodon [Ziprasidone Hcl] Other (See Comments)    hallucinations   Lactose Intolerance (Gi) Diarrhea and Other (See Comments)    Gas  and bloated    Penicillins Other (See Comments)    GI upset  Has patient had a PCN reaction causing immediate rash, facial/tongue/throat swelling, SOB or lightheadedness with hypotension: No Has patient had a PCN reaction causing severe rash involving mucus membranes or skin necrosis: No Has patient had a PCN reaction that required hospitalization No Has patient had a PCN reaction occurring within the last 10 years: Yes If all of the above answers are "NO", then may proceed with Cephalosporin use.    Zyprexa [Olanzapine] Other (See Comments)    Hallucinations     Metabolic Disorder Labs: No results found for: "HGBA1C", "MPG" No results found for: "PROLACTIN" Lab Results  Component Value Date   CHOL 250 (H) 01/29/2016   TRIG 87.0 01/29/2016   HDL 65.40 01/29/2016   CHOLHDL 4 01/29/2016   VLDL 17.4 01/29/2016    LDLCALC 167 (H) 01/29/2016   Lab Results  Component Value Date   TSH 1.36 06/30/2015    Therapeutic Level Labs: No results found for: "LITHIUM" Lab Results  Component Value Date   CBMZ 8.7 10/14/2015   Lab Results  Component Value Date   VALPROATE 32.4 (L) 10/14/2009    Current Medications: Current Outpatient Medications  Medication Sig Dispense Refill   zolpidem (AMBIEN CR) 6.25 MG CR tablet Take 1 tablet (6.25 mg total) by mouth at bedtime as needed for up to 10 doses for sleep. 10 tablet 0   AIMOVIG 140 MG/ML SOAJ Inject into the skin.     ALPRAZolam (XANAX) 0.25 MG tablet Take 0.25 mg by mouth at bedtime as needed for anxiety.      atorvastatin (LIPITOR) 10 MG tablet Take 10 mg by mouth daily.     carbamazepine (TEGRETOL XR) 200 MG 12 hr tablet Take 200 mg by mouth 2 (two) times daily.     carbamazepine (TEGRETOL) 200 MG tablet Take 1 tablet (200 mg total) by mouth 2 (two) times daily. 60 tablet 0   clonazePAM (KLONOPIN) 0.5 MG tablet Take by mouth.     dicyclomine (BENTYL) 20 MG tablet Take 1 tablet (20 mg total) by mouth 2 (two) times daily. 20 tablet 0   famotidine (PEPCID) 20 MG tablet Take 1 tablet (20 mg total) by mouth 2 (two) times daily. 30 tablet 0   HYDROcodone-acetaminophen (NORCO/VICODIN) 5-325 MG tablet Take 1 tablet by mouth every 4 (four) hours as needed. 10 tablet 0   ibuprofen (ADVIL) 800 MG tablet Take 1 tablet (800 mg total) by mouth 3 (three) times daily. 21 tablet 0   lamoTRIgine (LAMICTAL) 25 MG tablet Take 1 tablet (25 mg total) by mouth 2 (two) times daily. 60 tablet 0   methocarbamol (ROBAXIN) 500 MG tablet Take 1 tablet (500 mg total) by mouth 2 (two) times daily as needed for up to 15 doses for muscle spasms. 15 tablet 0   metoCLOPramide (REGLAN) 10 MG tablet Take 1 tablet (10 mg total) by mouth every 6 (six) hours. 30 tablet 0   metroNIDAZOLE (FLAGYL) 500 MG tablet Take 500 mg by mouth 2 (two) times daily.     ondansetron (ZOFRAN) 4 MG tablet Take  1 tablet (4 mg total) by mouth every 4 (four) hours as needed for nausea or vomiting. 6 tablet 0   phentermine 15 MG capsule Take 15 mg by mouth daily.     potassium chloride (KLOR-CON) 10 MEQ tablet Take 1 tablet (10 mEq total) by mouth daily for 30 doses. 30 tablet 0   prochlorperazine (  COMPAZINE) 10 MG tablet Take 1 tablet (10 mg total) by mouth 2 (two) times daily as needed for nausea or vomiting. 10 tablet 0   QUEtiapine (SEROQUEL) 200 MG tablet Take 200 mg by mouth 2 (two) times daily.     sertraline (ZOLOFT) 50 MG tablet Take 50 mg by mouth 2 (two) times daily.     traZODone (DESYREL) 100 MG tablet Take 3 tablets by mouth at bedtime.     UBRELVY 100 MG TABS Take by mouth.     WEGOVY 0.5 MG/0.5ML SOAJ Inject 0.5 mg into the skin once a week.     No current facility-administered medications for this visit.    Musculoskeletal: Strength & Muscle Tone: within normal limits Gait & Station: normal Patient leans: N/A  Psychiatric Specialty Exam: Review of Systems  Psychiatric/Behavioral:  Positive for decreased concentration and sleep disturbance. The patient is nervous/anxious.   All other systems reviewed and are negative.   There were no vitals taken for this visit.There is no height or weight on file to calculate BMI.  General Appearance: Casual  Eye Contact:  Good  Speech:  Clear and Coherent  Volume:  Normal  Mood:  Anxious and Depressed  Affect:  Congruent  Thought Process:  Coherent  Orientation:  Full (Time, Place, and Person)  Thought Content:  Logical  Suicidal Thoughts:  No  Homicidal Thoughts:  No  Memory:  Immediate;   Good Recent;   Good Remote;   Fair  Judgement:  Good  Insight:  Good  Psychomotor Activity:  Normal  Concentration:  Concentration: Good  Recall:  Good  Fund of Knowledge:Good  Language: Good  Akathisia:  No  Handed:  Right  AIMS (if indicated):  not done  Assets:  Communication Skills Desire for Improvement Resilience Social Support   ADL's:  Intact  Cognition: WNL  Sleep:  Poor   Screenings: AIMS    Flowsheet Row [facility]  AIMS Total Score 0      AUDIT    Flowsheet Row [facility]  Alcohol Use Disorder Identification Test Final Score (AUDIT) 0      PHQ2-9    Flowsheet Row Counselor from 12/09[facility]2022 [facility] Office Visit from 02/12/2016 in Primary Care at [facility] Visit from 06/30/2015 in Primary Care at [facility] Visit from 12/02/2014 in Primary Care at [facility] Visit from 09/18/2014 in Primary Care at [facility] Total Score 6 0 2 0 0  PHQ-9 Total Score 20 -- 19 -- --      Flowsheet Row Counselor from 12/09/2022 in [facility]OSPITALIZATION PROGRAM ED from 12/06/2022 in [facility] ED from 08/11/2022 in [facility] Emergency Department at [facility]  C-SSRS RISK CATEGORY Low Risk No Risk No Risk       Assessment and Plan:  Patient to start partial hospitalization programming Orders placed for Occupational therapy  (OT)  -Initiated Ambien CR 6.25 mg nightly x 10 tablets-(follow-up with primary psychiatrist for additional refills) -Discussed discontinuing phentermine 15 mg daily due to reported inability to sleep increased, agitation and worsening anxiety. "  I do not think this is my problem I been on this medication for years"    Collaboration of Care: Medication Management AEB see above and Psychiatrist AEB Challa  Patient/Guardian was advised Release of Information must be obtained prior to any record  release in order to collaborate their care with an outside provider. Patient/Guardian was advised if they have not already done so to contact the registration department to sign all necessary forms in order for [place] to release information regarding their  care.   Consent: Patient/Guardian gives verbal consent for treatment and assignment of benefits for services provided during this visit. Patient/Guardian expressed understanding and agreed to proceed.   [name], NP 9/20/202411:16 AM

## 2022-12-17 NOTE — Therapy (Signed)
[facility] PARTIAL HOSPITALIZATION PROGRAM [address] [place], [place], [address] Phone: [phone]   Fax:  [phone]  Occupational Therapy Evaluation Virtual Visit via Video Note  I connected with Stacey Weaver on 12/17/22 at  8:00 AM EDT by a video enabled telemedicine application and verified that I am speaking with the correct person using two identifiers.  Location: Patient: home Provider: office   I discussed the limitations of evaluation and management by telemedicine and the availability of in person appointments. The patient expressed understanding and agreed to proceed.    The patient was advised to call back or seek an in-person evaluation if the symptoms worsen or if the condition fails to improve as anticipated.  I provided 85 minutes of non-face-to-face time during this encounter.   Patient Details  Name: Stacey Weaver MRN: [id number] Date of Birth: 11/15/1975 No data recorded  Encounter Date: 12/16/2022   OT End of Session - 12/17/22 0930     Visit Number 1    Number of Visits 20    Date for OT Re-Evaluation 01/16/23    OT Start Time 0930    OT Stop Time 1255   Ev: 30; Tx: 55   OT Time Calculation (min) 205 min    Activity Tolerance Patient tolerated treatment well             Past Medical History:  Diagnosis Date   Anxiety    Asthma    Bipolar 1 disorder (HCC)    Migraine     Past Surgical History:  Procedure Laterality Date   KNEE SURGERY      There were no vitals filed for this visit.   Subjective Assessment - 12/17/22 0927     Subjective  I'm hoping to learn new coping skills while I am here.    Pertinent History MDD, GAD,    Patient Stated Goals improve areas that inhibit participation in daily life    Currently in Pain? No/denies    Pain Score 0-No pain    Multiple Pain Sites No               Group Session:  O: In this group therapy session, the objective was to explore the  multifaceted concept of purpose. The participants engaged in a deep exploration of the innate human impulse for purposeful engagement and the impact of purpose on mental and emotional well-being. The discussion centered around the challenges of modern life, including feelings of isolation despite technological advancements, the connection between purposelessness and depression/anxiety, and the importance of occupational roles in achieving fulfillment. Strategies were shared to identify areas of life where purpose is lacking and to develop immediate and actionable plans to overcome these challenges. The participants gained valuable insights into the intersection of purpose and mental health, as well as practical tools to navigate their personal journeys towards purpose-driven lives. Overall, the session fostered a sense of empowerment and inspired the participants to take proactive steps towards aligning their lives with their true purpose.   A: Patient demonstrated a high level of engagement and active participation. They actively contributed to the discussions, sharing personal insights and experiences related to the concept of purpose. Patient demonstrated a clear understanding of the relationship between purpose and mental well-being, acknowledging the importance of aligning actions with values and setting meaningful goals. They actively engaged in self-reflection exercises and expressed a commitment to incorporating strategies discussed in the session into their daily life. Patient exhibited a strong  motivation to cultivate purpose and showed a willingness to take proactive steps towards living a more purposeful and fulfilling life. Overall, Patient's active participation and enthusiasm contributed to a positive and collaborative therapeutic environment.   OT Assessment   OCAIRS Mental Health Interview Summary of Client Scores:  Facilitates participation in occupation Allows participation in occupation  Inhibits participation in occupation Restricts participation in occupation Comments:  Roles X      Habits  X     Personal Causation   X    Values  X     Interests    X   Skills   X    Short-Term Goals   X    Long-term Goals   X    Interpretation of Past Experiences   X    Physical Environment  X     Social Environment   X    Readiness for Change   X      Need for Occupational Therapy:  4 Shows positive occupational participation, no need for OT.   3 Need for minimal intervention/consultative participation   2 Need for OT intervention indicated to restore/improve participation   1 Need for extensive OT intervention indicated to improve participation.  Referral for follow up services also recommended.   Assessment:  Patient demonstrates behavior that INHIBITS participation in occupation.  Patient will benefit from occupational therapy intervention in order to improve time management, financial management, stress management, job readiness skills, social skills, and health management skills in preparation to return to full time community living and to be a productive community member.    Plan:  Patient will participate in skilled occupational therapy sessions individually or in a group setting to improve coping skills, psychosocial skills, and emotional skills required to return to prior level of function. Treatment will be 4-5 times per week for 4 weeks.     Kerrin Champagne, OT                    OT Education - 12/17/22 [phone]     Education Details OCAIRS / Purpose 1    Person(s) Educated Patient    Methods Explanation;Handout    Comprehension Verbalized understanding              OT Short Term Goals - 12/17/22 0932       OT SHORT TERM GOAL #1   Title Patient will be educated on strategies to improve psychosocial skills needed to participate fully in all daily, work, and leisure activities.    Time 4    Period Weeks    Status On-going    Target Date 01/16/23       OT SHORT TERM GOAL #2   Title Pt will apply psychosocial skills and coping mechanisms to daily activities in order to function independently and reintegrate into community dwelling    Status On-going      OT SHORT TERM GOAL #3   Title Pt will choose and/or engage in 1-3 socially engaging leisure activities to improve social participation skills upon reintegrating into community    Status On-going                      Plan - 12/17/22 0930     Clinical Impression Statement Pt presents w/ deficits in multiple psychosocial doamins that inhibit participation in daily activities and overall occupaitonal performance.    OT Occupational Profile and History Problem Focused Assessment - Including review of records relating to presenting problem  Occupational performance deficits (Please refer to evaluation for details): Rest and Sleep;Work;Social Participation;Leisure    Psychosocial Skills Interpersonal Interaction;Routines and Behaviors;Coping Strategies;Habits    Rehab Potential Good    Clinical Decision Making Limited treatment options, no task modification necessary    Comorbidities Affecting Occupational Performance: None    Modification or Assistance to Complete Evaluation  No modification of tasks or assist necessary to complete eval    OT Frequency 5x / week    OT Duration 4 weeks    OT Treatment/Interventions Psychosocial skills training;Coping strategies training    Consulted and Agree with Plan of Care Patient             Patient will benefit from skilled therapeutic intervention in order to improve the following deficits and impairments:       Psychosocial Skills: Interpersonal Interaction, Routines and Behaviors, Coping Strategies, Habits   Visit Diagnosis: Bipolar I disorder, most recent episode depressed (HCC)  Difficulty coping  Prolonged grief disorder    Problem List Patient Active Problem List   Diagnosis Date Noted   Prolonged grief  disorder 12/09/2022   Neck pain 01/06/2017   Upper back pain 01/06/2017   Cervical radiculopathy 01/06/2017   Hyperlipidemia 01/29/2016   Severe episode of recurrent major depressive disorder, without psychotic features (HCC)    Insomnia    MDD (major depressive disorder) 10/10/2015   Bipolar I disorder, most recent episode depressed (HCC)    Feeling suicidal 10/09/2015   Rhabdomyolysis 08/08/2011   Antihistamines overdose 08/08/2011   SSRI overdose 08/07/2011   Depression 08/07/2011   Anxiety disorder 08/07/2011   BMI 35.0-35.9,adult 08/07/2011   Hypothyroidism 08/07/2011    Ted Mcalpine, OT 12/17/2022, 9:36 AM  Kerrin Champagne, OT   [facility] HOSPITALIZATION PROGRAM [address] [place], [place], [address] Phone: [phone]   Fax:  [phone]  Name: Stacey Weaver MRN: [id number] Date of Birth: [birth year]

## 2022-12-17 NOTE — Addendum Note (Signed)
Addended by: [name] on: 12/17/2022 11:58 AM   Modules accepted: Orders

## 2022-12-20 ENCOUNTER — Other Ambulatory Visit (HOSPITAL_COMMUNITY): Payer: No Typology Code available for payment source | Admitting: Licensed Clinical Social Worker

## 2022-12-20 ENCOUNTER — Other Ambulatory Visit (HOSPITAL_COMMUNITY): Payer: No Typology Code available for payment source

## 2022-12-20 DIAGNOSIS — F4381 Prolonged grief disorder: Secondary | ICD-10-CM

## 2022-12-20 DIAGNOSIS — F3130 Bipolar disorder, current episode depressed, mild or moderate severity, unspecified: Secondary | ICD-10-CM | POA: Diagnosis not present

## 2022-12-20 DIAGNOSIS — R4589 Other symptoms and signs involving emotional state: Secondary | ICD-10-CM

## 2022-12-21 ENCOUNTER — Encounter (HOSPITAL_COMMUNITY): Payer: Self-pay

## 2022-12-21 ENCOUNTER — Other Ambulatory Visit (HOSPITAL_COMMUNITY): Payer: No Typology Code available for payment source | Admitting: Licensed Clinical Social Worker

## 2022-12-21 ENCOUNTER — Other Ambulatory Visit (HOSPITAL_COMMUNITY): Payer: No Typology Code available for payment source

## 2022-12-21 DIAGNOSIS — R4589 Other symptoms and signs involving emotional state: Secondary | ICD-10-CM

## 2022-12-21 DIAGNOSIS — F4381 Prolonged grief disorder: Secondary | ICD-10-CM

## 2022-12-21 DIAGNOSIS — F3130 Bipolar disorder, current episode depressed, mild or moderate severity, unspecified: Secondary | ICD-10-CM | POA: Diagnosis not present

## 2022-12-21 NOTE — Therapy (Signed)
[facility] PARTIAL HOSPITALIZATION PROGRAM [address] [place], [place], [address] Phone: [phone]   Fax:  [phone]  Occupational Therapy Treatment Virtual Visit via Video Note  I connected with Stacey Weaver on 12/21/22 at  8:00 AM EDT by a video enabled telemedicine application and verified that I am speaking with the correct person using two identifiers.  Location: Patient: home Provider: office   I discussed the limitations of evaluation and management by telemedicine and the availability of in person appointments. The patient expressed understanding and agreed to proceed.    The patient was advised to call back or seek an in-person evaluation if the symptoms worsen or if the condition fails to improve as anticipated.  I provided 55 minutes of non-face-to-face time during this encounter.   Patient Details  Name: Stacey Weaver: [id number] Date of Birth: 01-19-1976 No data recorded  Encounter Date: 12/17/2022   OT End of Session - 12/21/22 0836     Visit Number 2    Number of Visits 20    Date for OT Re-Evaluation 01/16/23    OT Start Time 1200    OT Stop Time 1255    OT Time Calculation (min) 55 min             Past Medical History:  Diagnosis Date   Anxiety    Asthma    Bipolar 1 disorder (HCC)    Migraine     Past Surgical History:  Procedure Laterality Date   KNEE SURGERY      There were no vitals filed for this visit.   Subjective Assessment - 12/21/22 0836     Currently in Pain? No/denies    Pain Score 0-No pain               Group Session:  S: Feeling better today   O: In this group therapy session, the objective was to explore the multifaceted concept of purpose. The participants engaged in a deep exploration of the innate human impulse for purposeful engagement and the impact of purpose on mental and emotional well-being. The discussion centered around the challenges of modern life,  including feelings of isolation despite technological advancements, the connection between purposelessness and depression/anxiety, and the importance of occupational roles in achieving fulfillment. Strategies were shared to identify areas of life where purpose is lacking and to develop immediate and actionable plans to overcome these challenges. The participants gained valuable insights into the intersection of purpose and mental health, as well as practical tools to navigate their personal journeys towards purpose-driven lives. Overall, the session fostered a sense of empowerment and inspired the participants to take proactive steps towards aligning their lives with their true purpose.   A: Patient demonstrated a high level of engagement and active participation. They actively contributed to the discussions, sharing personal insights and experiences related to the concept of purpose. Patient demonstrated a clear understanding of the relationship between purpose and mental well-being, acknowledging the importance of aligning actions with values and setting meaningful goals. They actively engaged in self-reflection exercises and expressed a commitment to incorporating strategies discussed in the session into their daily life. Patient exhibited a strong motivation to cultivate purpose and showed a willingness to take proactive steps towards living a more purposeful and fulfilling life. Overall, Patient's active participation and enthusiasm contributed to a positive and collaborative therapeutic environment.   P: Continue to attend PHP OT group sessions 5x week for 4 weeks to promote daily  structure, social engagement, and opportunities to develop and utilize adaptive strategies to maximize functional performance in preparation for safe transition and integration back into school, work, and the community. Plan to address topic of SMART Goals in next OT group session.                    OT  Education - 12/21/22 0836     Education Details Purpose 2              OT Short Term Goals - 12/17/22 0932       OT SHORT TERM GOAL #1   Title Patient will be educated on strategies to improve psychosocial skills needed to participate fully in all daily, work, and leisure activities.    Time 4    Period Weeks    Status On-going    Target Date 01/16/23      OT SHORT TERM GOAL #2   Title Pt will apply psychosocial skills and coping mechanisms to daily activities in order to function independently and reintegrate into community dwelling    Status On-going      OT SHORT TERM GOAL #3   Title Pt will choose and/or engage in 1-3 socially engaging leisure activities to improve social participation skills upon reintegrating into community    Status On-going                      Plan - 12/21/22 0836     Psychosocial Skills Interpersonal Interaction;Routines and Behaviors;Coping Strategies;Habits             Patient will benefit from skilled therapeutic intervention in order to improve the following deficits and impairments:       Psychosocial Skills: Interpersonal Interaction, Routines and Behaviors, Coping Strategies, Habits   Visit Diagnosis: Difficulty coping    Problem List Patient Active Problem List   Diagnosis Date Noted   Prolonged grief disorder 12/09/2022   Neck pain 01/06/2017   Upper back pain 01/06/2017   Cervical radiculopathy 01/06/2017   Hyperlipidemia 01/29/2016   Severe episode of recurrent major depressive disorder, without psychotic features (HCC)    Insomnia    MDD (major depressive disorder) 10/10/2015   Bipolar I disorder, most recent episode depressed (HCC)    Feeling suicidal 10/09/2015   Rhabdomyolysis 08/08/2011   Antihistamines overdose 08/08/2011   SSRI overdose 08/07/2011   Depression 08/07/2011   Anxiety disorder 08/07/2011   BMI 35.0-35.9,adult 08/07/2011   Hypothyroidism 08/07/2011    Ted Mcalpine,  OT 12/21/2022, 8:37 AM  Kerrin Champagne, OT   [facility] HOSPITALIZATION PROGRAM [address] [place], [place], [address] Phone: [phone]   Fax:  [phone]  Name: Stacey Weaver Weaver: [id number] Date of Birth: October 13, 1975

## 2022-12-21 NOTE — Progress Notes (Signed)
BH MD/PA/NP OP Progress Note  12/21/2022 11:35 AM Stacey Weaver  MRN:  [id number]  Chief Complaint:  "I am still not sleeping."  HPI: Stacey Weaver 47 year old African-American female carries a diagnosis with bipolar 1 disorder, generalized anxiety disorder major depressive disorder.  Currently she is prescribed Lamictal, trazodone, Zoloft, Klonopin, Tegretol and interrogated.  She continues to endorse having sleeping issues.  She does report a situation that happened over the weekend where she traveled with grief and loss again and has not been able to get any rest.  Patient was initiated on Ambien 6.25 CR 3 days prior.  She reports she was able to sleep the first night however has not been able to sleep since. Medication options were provided on initial assessment however patient reported adverse reactions to medications and/or poor adherence.  She declined Doxepin, Remeron, Ramelton and trazodone does not help with her sleep. stated that she has been on Seroquel in the past and " it doesn't help." Declined Unisom.     Consider following up with sleep study.  "  I have already done that before."  Discussed that patient is discontinue phentermine because it  may be causing increased anxiety and inability to rest patient was reluctant to discontinue medication at this time.   Discussed following up with her attending psychiatrist [place].  May be excused 1 day in order to get stabilized on sleep medications.  Case staffed with attending psychiatrist MD Lucianne Muss. Support, encouragement and reassurance was provided.    Visit Diagnosis:    ICD-10-CM   1. Bipolar I disorder, most recent episode depressed (HCC)  F31.30       Past Psychiatric History:   Past Medical History:  Past Medical History:  Diagnosis Date   Anxiety    Asthma    Bipolar 1 disorder (HCC)    Migraine     Past Surgical History:  Procedure Laterality Date   KNEE SURGERY      Family Psychiatric History:   Family  History:  Family History  Problem Relation Age of Onset   Hyperlipidemia Mother    Hypertension Mother    Heart disease Father 67       CAD   Hyperlipidemia Maternal Grandmother    Cancer Maternal Grandmother    Cancer Maternal Grandfather    Hyperlipidemia Brother     Social History:  Social History   Socioeconomic History   Marital status: Single    Spouse name: Not on file   Number of children: Not on file   Years of education: Not on file   Highest education level: Not on file  Occupational History   Not on file  Tobacco Use   Smoking status: Never   Smokeless tobacco: Never  Substance and Sexual Activity   Alcohol use: No   Drug use: No   Sexual activity: Yes    Birth control/protection: I.U.D.  Other Topics Concern   Not on file  Social History Narrative   Not on file   Social Determinants of Health   Financial Resource Strain: Not on file  Food Insecurity: Medium Risk (11/04/2022)   Received from [facility]   Hunger Vital Sign    Worried About Running Out of Food in the Last Year: Sometimes true    Ran Out of Food in the Last Year: Sometimes true  Transportation Needs: Not on file (11/04/2022)  Physical Activity: Not on file  Stress: Not on file  Social Connections: Unknown (08/04/2021)   [facility]   Social Network    Social Network: Not on file    Allergies:  Allergies  Allergen Reactions   Almotriptan Malate Shortness Of Breath and Other (See Comments)    Muscle spasms   Cymbalta [Duloxetine Hcl] Other (See Comments)    Other reaction(s): Other Muscle spasms suicidal thoughts   Imitrex [Sumatriptan] Shortness Of Breath, Other (See Comments) and Palpitations    Muscle spasms   Prednisone Other (See Comments) and Palpitations   Zomig Shortness Of Breath   Geodon [Ziprasidone Hcl] Other (See Comments)    hallucinations   Lactose Intolerance (Gi) Diarrhea and Other (See Comments)    Gas and bloated     Penicillins Other (See Comments)    GI upset  Has patient had a PCN reaction causing immediate rash, facial/tongue/throat swelling, SOB or lightheadedness with hypotension: No Has patient had a PCN reaction causing severe rash involving mucus membranes or skin necrosis: No Has patient had a PCN reaction that required hospitalization No Has patient had a PCN reaction occurring within the last 10 years: Yes If all of the above answers are "NO", then may proceed with Cephalosporin use.    Zyprexa [Olanzapine] Other (See Comments)    Hallucinations     Metabolic Disorder Labs: No results found for: "HGBA1C", "MPG" No results found for: "PROLACTIN" Lab Results  Component Value Date   CHOL 250 (H) 01/29/2016   TRIG 87.0 01/29/2016   HDL 65.40 01/29/2016   CHOLHDL 4 01/29/2016   VLDL 17.4 01/29/2016   LDLCALC 167 (H) 01/29/2016   Lab Results  Component Value Date   TSH 1.36 06/30/2015   TSH 4.132 08/24/2011    Therapeutic Level Labs: No results found for: "LITHIUM" Lab Results  Component Value Date   VALPROATE 32.4 (L) 10/14/2009   VALPROATE 68.2 10/19/2008   Lab Results  Component Value Date   CBMZ 8.7 10/14/2015    Current Medications: Current Outpatient Medications  Medication Sig Dispense Refill   AIMOVIG 140 MG/ML SOAJ Inject into the skin.     ALPRAZolam (XANAX) 0.25 MG tablet Take 0.25 mg by mouth at bedtime as needed for anxiety.      atorvastatin (LIPITOR) 10 MG tablet Take 10 mg by mouth daily.     carbamazepine (TEGRETOL XR) 200 MG 12 hr tablet Take 200 mg by mouth 2 (two) times daily.     carbamazepine (TEGRETOL) 200 MG tablet Take 1 tablet (200 mg total) by mouth 2 (two) times daily. 60 tablet 0   clonazePAM (KLONOPIN) 0.5 MG tablet Take by mouth.     dicyclomine (BENTYL) 20 MG tablet Take 1 tablet (20 mg total) by mouth 2 (two) times daily. 20 tablet 0   famotidine (PEPCID) 20 MG tablet Take 1 tablet (20 mg total) by mouth 2 (two) times daily. 30 tablet 0    HYDROcodone-acetaminophen (NORCO/VICODIN) 5-325 MG tablet Take 1 tablet by mouth every 4 (four) hours as needed. 10 tablet 0   ibuprofen (ADVIL) 800 MG tablet Take 1 tablet (800 mg total) by mouth 3 (three) times daily. 21 tablet 0   lamoTRIgine (LAMICTAL) 25 MG tablet Take 1 tablet (25 mg total) by mouth 2 (two) times daily. 60 tablet 0   methocarbamol (ROBAXIN) 500 MG tablet Take 1 tablet (500 mg total) by mouth 2 (two) times daily as needed for up to 15 doses for muscle spasms. 15 tablet 0   metoCLOPramide (REGLAN) 10 MG tablet Take 1 tablet (10 mg total)  by mouth every 6 (six) hours. 30 tablet 0   metroNIDAZOLE (FLAGYL) 500 MG tablet Take 500 mg by mouth 2 (two) times daily.     ondansetron (ZOFRAN) 4 MG tablet Take 1 tablet (4 mg total) by mouth every 4 (four) hours as needed for nausea or vomiting. 6 tablet 0   phentermine 15 MG capsule Take 15 mg by mouth daily.     potassium chloride (KLOR-CON) 10 MEQ tablet Take 1 tablet (10 mEq total) by mouth daily for 30 doses. 30 tablet 0   prochlorperazine (COMPAZINE) 10 MG tablet Take 1 tablet (10 mg total) by mouth 2 (two) times daily as needed for nausea or vomiting. 10 tablet 0   QUEtiapine (SEROQUEL) 200 MG tablet Take 200 mg by mouth 2 (two) times daily.     sertraline (ZOLOFT) 50 MG tablet Take 50 mg by mouth 2 (two) times daily.     traZODone (DESYREL) 100 MG tablet Take 3 tablets by mouth at bedtime.     UBRELVY 100 MG TABS Take by mouth.     WEGOVY 0.5 MG/0.5ML SOAJ Inject 0.5 mg into the skin once a week.     zolpidem (AMBIEN CR) 6.25 MG CR tablet Take 1 tablet (6.25 mg total) by mouth at bedtime as needed for up to 10 doses for sleep. 10 tablet 0   No current facility-administered medications for this visit.     Musculoskeletal: Strength & Muscle Tone: within normal limits Gait & Station: normal Patient leans: N/A  Psychiatric Specialty Exam: Review of Systems  Psychiatric/Behavioral:  Positive for sleep disturbance.   All  other systems reviewed and are negative.   There were no vitals taken for this visit.There is no height or weight on file to calculate BMI.  General Appearance: Casual  Eye Contact:  Good  Speech:  Clear and Coherent  Volume:  Normal  Mood:  Anxious  Affect:  Congruent  Thought Process:  Coherent  Orientation:  Full (Time, Place, and Person)  Thought Content: Logical   Suicidal Thoughts:  No  Homicidal Thoughts:  No  Memory:  Immediate;   Fair Recent;   Fair  Judgement:  Good  Insight:  Good  Psychomotor Activity:  Normal  Concentration:  Concentration: Good  Recall:  Good  Fund of Knowledge: Good  Language: Good  Akathisia:  No  Handed:  Right  AIMS (if indicated): done  Assets:  Communication Skills Desire for Improvement Social Support  ADL's:  Intact  Cognition: WNL  Sleep:  Good   Screenings: AIMS    Flowsheet Row [facility]  AIMS Total Score 0      AUDIT    Flowsheet Row [facility]  Alcohol Use Disorder Identification Test Final Score (AUDIT) 0      PHQ2-9    Flowsheet Row Counselor from 12/09/2022 [facility] PARTIAL HOSPITALIZATION PROGRAM Office Visit from 02/12/2016 in Primary Care at [facility] Visit from 06/30/2015 in Primary Care at [facility] Visit from 12/02/2014 in Primary Care at [facility] Visit from 09/18/2014 in Primary Care at [facility] Total Score 6 0 2 0 0  PHQ-9 Total Score 20 -- 19 -- --      Flowsheet Row Counselor from 12/09/2022 in [facility] PARTIAL HOSPITALIZATION PROGRAM ED from 12/06/2022 in [facility] ED from 08/11/2022 in [facility] Emergency Department at [facility]  C-SSRS RISK CATEGORY Low Risk No Risk No Risk        Assessment and Plan:  Continue partial hospitalization programming Continue medications as  indicated Consider following up with attending psychiatrist for medication management related to sleep issues   Collaboration of Care: Collaboration of Care: Medication Management AEB continue Ambien 6.25  Patient/Guardian was advised Release of Information must be obtained prior to any record release in order to collaborate their care with an outside provider. Patient/Guardian was advised if they have not already done so to contact the registration department to sign all necessary forms in order for [place] to release information regarding their care.   Consent: Patient/Guardian gives verbal consent for treatment and assignment of benefits for services provided during this visit. Patient/Guardian expressed understanding and agreed to proceed.    [name], NP 12/21/2022, 11:35 AM

## 2022-12-21 NOTE — Therapy (Signed)
[facility] PARTIAL HOSPITALIZATION PROGRAM [address] [place], [place], [address] Phone: [phone]   Fax:  [phone]  Occupational Therapy Treatment Virtual Visit via Video Note  I connected with Stacey Weaver on 12/21/22 at  8:00 AM EDT by a video enabled telemedicine application and verified that I am speaking with the correct person using two identifiers.  Location: Patient: home Provider: office   I discussed the limitations of evaluation and management by telemedicine and the availability of in person appointments. The patient expressed understanding and agreed to proceed.    The patient was advised to call back or seek an in-person evaluation if the symptoms worsen or if the condition fails to improve as anticipated.  I provided 55 minutes of non-face-to-face time during this encounter.   Patient Details  Name: Stacey Weaver MRN: [id number] Date of Birth: 1975/09/23 No data recorded  Encounter Date: 12/20/2022   OT End of Session - 12/21/22 0850     Visit Number 3    Number of Visits 20    Date for OT Re-Evaluation 01/16/23    OT Start Time 1200    OT Stop Time 1255    OT Time Calculation (min) 55 min             Past Medical History:  Diagnosis Date   Anxiety    Asthma    Bipolar 1 disorder (HCC)    Migraine     Past Surgical History:  Procedure Laterality Date   KNEE SURGERY      There were no vitals filed for this visit.   Subjective Assessment - 12/21/22 0850     Currently in Pain? No/denies    Pain Score 0-No pain                Group Session:  S: Doing a bit better today I think.   O: During today's OT group session, the patient participated in an educational segment about the importance of goal-setting and the application of the SMART framework to enhance daily life, particularly focusing on ADLs and iADLs. The session began with five open-ended pre-session questions that facilitated  group discussion and introspection about their current relationship with goals. Following the introduction and educational segment, participants engaged in brainstorming and group discussions to devise hypothetical SMART goals. The session concluded with five post-session questions to reinforce understanding and facilitate reflection. Throughout the session, there was a range of engagement levels noted among the participants.   A:  Patient demonstrated a high level of engagement throughout the session. They actively participated in discussions, sharing personal experiences related to goal setting and challenges faced. Patient was able to clearly articulate an understanding of the SMART framework and proposed personal SMART goals related to their own ADLs with minimal assistance. They expressed enthusiasm about applying what they learned to their daily routine and appeared motivated to make changes.    P: Continue to attend PHP OT group sessions 5x week for 4 weeks to promote daily structure, social engagement, and opportunities to develop and utilize adaptive strategies to maximize functional performance in preparation for safe transition and integration back into school, work, and the community. Plan to address topic of pt 2 in next OT group session.                   OT Education - 12/21/22 0850     Education Details SMART Goals  OT Short Term Goals - 12/17/22 0932       OT SHORT TERM GOAL #1   Title Patient will be educated on strategies to improve psychosocial skills needed to participate fully in all daily, work, and leisure activities.    Time 4    Period Weeks    Status On-going    Target Date 01/16/23      OT SHORT TERM GOAL #2   Title Pt will apply psychosocial skills and coping mechanisms to daily activities in order to function independently and reintegrate into community dwelling    Status On-going      OT SHORT TERM GOAL #3   Title Pt will  choose and/or engage in 1-3 socially engaging leisure activities to improve social participation skills upon reintegrating into community    Status On-going                      Plan - 12/21/22 0850     Psychosocial Skills Interpersonal Interaction;Routines and Behaviors;Coping Strategies;Habits             Patient will benefit from skilled therapeutic intervention in order to improve the following deficits and impairments:       Psychosocial Skills: Interpersonal Interaction, Routines and Behaviors, Coping Strategies, Habits   Visit Diagnosis: Difficulty coping    Problem List Patient Active Problem List   Diagnosis Date Noted   Prolonged grief disorder 12/09/2022   Neck pain 01/06/2017   Upper back pain 01/06/2017   Cervical radiculopathy 01/06/2017   Hyperlipidemia 01/29/2016   Severe episode of recurrent major depressive disorder, without psychotic features (HCC)    Insomnia    MDD (major depressive disorder) 10/10/2015   Bipolar I disorder, most recent episode depressed (HCC)    Feeling suicidal 10/09/2015   Rhabdomyolysis 08/08/2011   Antihistamines overdose 08/08/2011   SSRI overdose 08/07/2011   Depression 08/07/2011   Anxiety disorder 08/07/2011   BMI 35.0-35.9,adult 08/07/2011   Hypothyroidism 08/07/2011    Ted Mcalpine, OT 12/21/2022, 8:51 AM  Kerrin Champagne, OT   [facility] HOSPITALIZATION PROGRAM [address] [place], [place], [address] Phone: [phone]   Fax:  [phone]  Name: Stacey Weaver MRN: [id number] Date of Birth: 08-28-1975

## 2022-12-22 ENCOUNTER — Other Ambulatory Visit (HOSPITAL_BASED_OUTPATIENT_CLINIC_OR_DEPARTMENT_OTHER): Payer: No Typology Code available for payment source | Admitting: Licensed Clinical Social Worker

## 2022-12-22 ENCOUNTER — Telehealth (HOSPITAL_COMMUNITY): Payer: Self-pay | Admitting: Licensed Clinical Social Worker

## 2022-12-22 ENCOUNTER — Other Ambulatory Visit (HOSPITAL_COMMUNITY): Payer: No Typology Code available for payment source

## 2022-12-22 DIAGNOSIS — F4381 Prolonged grief disorder: Secondary | ICD-10-CM

## 2022-12-22 DIAGNOSIS — F3130 Bipolar disorder, current episode depressed, mild or moderate severity, unspecified: Secondary | ICD-10-CM

## 2022-12-23 ENCOUNTER — Encounter (HOSPITAL_COMMUNITY): Payer: Self-pay

## 2022-12-23 ENCOUNTER — Other Ambulatory Visit (HOSPITAL_COMMUNITY): Payer: No Typology Code available for payment source | Admitting: Licensed Clinical Social Worker

## 2022-12-23 ENCOUNTER — Other Ambulatory Visit (HOSPITAL_COMMUNITY): Payer: No Typology Code available for payment source

## 2022-12-23 DIAGNOSIS — F4381 Prolonged grief disorder: Secondary | ICD-10-CM

## 2022-12-23 DIAGNOSIS — F3130 Bipolar disorder, current episode depressed, mild or moderate severity, unspecified: Secondary | ICD-10-CM | POA: Diagnosis not present

## 2022-12-23 DIAGNOSIS — R4589 Other symptoms and signs involving emotional state: Secondary | ICD-10-CM

## 2022-12-23 NOTE — Therapy (Signed)
[facility] PARTIAL HOSPITALIZATION PROGRAM [address] [place], [place], [address] Phone: [phone]   Fax:  [phone]  Occupational Therapy Treatment Virtual Visit via Video Note  I connected with Stacey Weaver on 12/23/22 at  8:00 AM EDT by a video enabled telemedicine application and verified that I am speaking with the correct person using two identifiers.  Location: Patient: home Provider: office   I discussed the limitations of evaluation and management by telemedicine and the availability of in person appointments. The patient expressed understanding and agreed to proceed.    The patient was advised to call back or seek an in-person evaluation if the symptoms worsen or if the condition fails to improve as anticipated.  I provided 55 minutes of non-face-to-face time during this encounter.   Patient Details  Name: Stacey Weaver MRN: [id number] Date of Birth: 1976-02-11 No data recorded  Encounter Date: 12/21/2022   OT End of Session - 12/23/22 0959     Visit Number 4    Number of Visits 20    Date for OT Re-Evaluation 01/16/23    OT Start Time 1200    OT Stop Time 1255    OT Time Calculation (min) 55 min             Past Medical History:  Diagnosis Date   Anxiety    Asthma    Bipolar 1 disorder (HCC)    Migraine     Past Surgical History:  Procedure Laterality Date   KNEE SURGERY      There were no vitals filed for this visit.   Subjective Assessment - 12/23/22 0959     Currently in Pain? No/denies    Pain Score 0-No pain               Group Session:  S: Feeling better today.   O: During today's OT group session, the patient participated in an educational segment about the importance of goal-setting and the application of the SMART framework to enhance daily life, particularly focusing on ADLs and iADLs. The session began with five open-ended pre-session questions that facilitated group  discussion and introspection about their current relationship with goals. Following the introduction and educational segment, participants engaged in brainstorming and group discussions to devise hypothetical SMART goals. The session concluded with five post-session questions to reinforce understanding and facilitate reflection. Throughout the session, there was a range of engagement levels noted among the participants.   A:  Patient demonstrated a high level of engagement throughout the session. They actively participated in discussions, sharing personal experiences related to goal setting and challenges faced. Patient was able to clearly articulate an understanding of the SMART framework and proposed personal SMART goals related to their own ADLs with minimal assistance. They expressed enthusiasm about applying what they learned to their daily routine and appeared motivated to make changes.   P: Continue to attend PHP OT group sessions 5x week for 4 weeks to promote daily structure, social engagement, and opportunities to develop and utilize adaptive strategies to maximize functional performance in preparation for safe transition and integration back into school, work, and the community. Plan to address topic of tbd in next OT group session.                    OT Education - 12/23/22 0959     Education Details SMART Goals              OT Short  Term Goals - 12/17/22 0932       OT SHORT TERM GOAL #1   Title Patient will be educated on strategies to improve psychosocial skills needed to participate fully in all daily, work, and leisure activities.    Time 4    Period Weeks    Status On-going    Target Date 01/16/23      OT SHORT TERM GOAL #2   Title Pt will apply psychosocial skills and coping mechanisms to daily activities in order to function independently and reintegrate into community dwelling    Status On-going      OT SHORT TERM GOAL #3   Title Pt will choose  and/or engage in 1-3 socially engaging leisure activities to improve social participation skills upon reintegrating into community    Status On-going                      Plan - 12/23/22 0959     Occupational performance deficits (Please refer to evaluation for details): Rest and Sleep;Work;Social Participation;Leisure    Psychosocial Skills Interpersonal Interaction;Routines and Behaviors;Coping Strategies;Habits             Patient will benefit from skilled therapeutic intervention in order to improve the following deficits and impairments:       Psychosocial Skills: Interpersonal Interaction, Routines and Behaviors, Coping Strategies, Habits   Visit Diagnosis: Difficulty coping    Problem List Patient Active Problem List   Diagnosis Date Noted   Prolonged grief disorder 12/09/2022   Neck pain 01/06/2017   Upper back pain 01/06/2017   Cervical radiculopathy 01/06/2017   Hyperlipidemia 01/29/2016   Severe episode of recurrent major depressive disorder, without psychotic features (HCC)    Insomnia    MDD (major depressive disorder) 10/10/2015   Bipolar I disorder, most recent episode depressed (HCC)    Feeling suicidal 10/09/2015   Rhabdomyolysis 08/08/2011   Antihistamines overdose 08/08/2011   SSRI overdose 08/07/2011   Depression 08/07/2011   Anxiety disorder 08/07/2011   BMI 35.0-35.9,adult 08/07/2011   Hypothyroidism 08/07/2011    Ted Mcalpine, OT 12/23/2022, 10:00 AM  Kerrin Champagne, OT   [facility] HOSPITALIZATION PROGRAM [address] [place], [place], [address] Phone: [phone]   Fax:  [phone]  Name: Stacey Weaver MRN: [id number] Date of Birth: 07-07-1975

## 2022-12-24 ENCOUNTER — Other Ambulatory Visit (HOSPITAL_COMMUNITY): Payer: No Typology Code available for payment source

## 2022-12-24 ENCOUNTER — Other Ambulatory Visit (HOSPITAL_COMMUNITY): Payer: No Typology Code available for payment source | Admitting: Licensed Clinical Social Worker

## 2022-12-24 ENCOUNTER — Encounter (HOSPITAL_COMMUNITY): Payer: Self-pay | Admitting: Family

## 2022-12-24 DIAGNOSIS — F4381 Prolonged grief disorder: Secondary | ICD-10-CM

## 2022-12-24 DIAGNOSIS — F3130 Bipolar disorder, current episode depressed, mild or moderate severity, unspecified: Secondary | ICD-10-CM | POA: Diagnosis not present

## 2022-12-24 DIAGNOSIS — G4709 Other insomnia: Secondary | ICD-10-CM

## 2022-12-24 NOTE — Progress Notes (Unsigned)
This provider spoke to patient regarding issues related to sleep.  Consulted with MD Lucianne Muss, who asked that patient be placed on her schedule for 9/30 to review medications. Patient stated " that's not going to help me this weekend." Patient was encouraged to follow-up with her outpatient provider for other medication options to assist with reported sleep issues's. Patient stated " this is tacky" as this provider is unable to assist. Support, encouragement and reassurance was provided.

## 2022-12-26 NOTE — Psych (Signed)
Virtual Visit via Video Note  I connected with Stacey Weaver on 12/20/22 at  9:00 AM EDT by a video enabled telemedicine application and verified that I am speaking with the correct person using two identifiers.  Location: Patient: patient home Provider: clinical home office   I discussed the limitations of evaluation and management by telemedicine and the availability of in person appointments. The patient expressed understanding and agreed to proceed.  I discussed the assessment and treatment plan with the patient. The patient was provided an opportunity to ask questions and all were answered. The patient agreed with the plan and demonstrated an understanding of the instructions.   The patient was advised to call back or seek an in-person evaluation if the symptoms worsen or if the condition fails to improve as anticipated.  Pt was provided 240 minutes of non-face-to-face time during this encounter.   Donia Guiles, LCSW   [facility] PHP THERAPIST PROGRESS NOTE  Stacey Weaver [id number]  Session Time: 9:00 - 10:00  Participation Level: Active  Behavioral Response: CasualAlertAnxious and Depressed  Type of Therapy: Group Therapy  Treatment Goals addressed: Coping  Progress Towards Goals: Initial  Interventions: CBT, DBT, Supportive, and Reframing  Summary: Stacey Weaver is a 47 y.o. female who presents with mood symptoms and grief.  Clinician led check-in regarding current stressors and situation, and review of patient completed daily inventory. Clinician utilized active listening and empathetic response and validated patient emotions. Clinician facilitated processing group on pertinent issues.?    Therapist Response:  Patient arrived within time allowed. Patient rates her mood at a 3 on a scale of 1-10 with 10 being best. Pt states she feels "I don't even know." Pt states she slept 2 broken hours and ate 1x. Pt reports she slept 5 hours on Saturday and thinks  rumination may have deterred her more on Sunday night. Pt shares that a "good coworker" died over the weekend and the office was told on Sunday. Pt states the loss hit too much in light of her current grief and connecting to her coworker's children who have just lost their mom. Pt reports work is typically her escape and losing that on Sunday was not good for her. Pt states increased rumination and body aches. Pt reports drinking 1-2 protein shakes a day to supplement for not eating. Patient able to process. Patient engaged in discussion.          Session Time: 10:00 am - 11:00 am   Participation Level: Active   Behavioral Response: CasualAlertDepressed   Type of Therapy: Group Therapy   Treatment Goals addressed: Coping   Progress Towards Goals: Progressing   Interventions: CBT, DBT, Solution Focused, Strength-based, Supportive, and Reframing   Therapist Response: Cln led processing group for pt's current struggles. Group members shared stressors and provided support and feedback. Cln brought in topics of boundaries, healthy relationships, and unhealthy thought processes to inform discussion.    Therapist Response:  Pt able to process and provide support to group.          Session Time: 11:00 -12:00   Participation Level: Active   Behavioral Response: CasualAlertDepressed   Type of Therapy: Group Therapy   Treatment Goals addressed: Coping   Progress Towards Goals: Progressing   Interventions: CBT, DBT, Solution Focused, Strength-based, Supportive, and Reframing   Summary: Cln led discussion on how to handle big feelings. Cln encouraged pt's to use the intensity of the feeling to guide how to proceed: if it is  a lower intensity problem solving and thought challenging can be useful, for a larger intensity distress tolerance skills are the most effective strategy.    Therapist Response:  Pt engaged in discussion and is able to share ways in which re-thinking action when they  are emotional may benefit.          Session Time: 12:00 -1:00   Participation Level: Active   Behavioral Response: CasualAlertDepressed   Type of Therapy: Group therapy   Treatment Goals addressed: Coping   Progress Towards Goals: Progressing   Interventions: OT group   Summary: 12:00 - 12:50: Occupational Therapy group with cln E. Hollan.  12:50 - 1:00 Clinician assessed for immediate needs, medication compliance and efficacy, and safety concerns.   Therapist Response: 12:00 - 12:50: See note 12:50 - 1:00 pm: At check-out, patient reports no immediate concerns. Patient demonstrates progress as evidenced by continued engagement and responsiveness to treatment. Patient denies SI/HI/self-harm thoughts at the end of group.     Suicidal/Homicidal: Nowithout intent/plan  Plan: Pt will continue in PHP while working to decrease depression/anxiety/grief symptoms, increase emotion regulation, and increase ability to manage symptoms in a healthy manner.   Collaboration of Care: Medication Management AEB T Lewis  Patient/Guardian was advised Release of Information must be obtained prior to any record release in order to collaborate their care with an outside provider. Patient/Guardian was advised if they have not already done so to contact the registration department to sign all necessary forms in order for [place] to release information regarding their care.   Consent: Patient/Guardian gives verbal consent for treatment and assignment of benefits for services provided during this visit. Patient/Guardian expressed understanding and agreed to proceed.   Diagnosis: Bipolar I disorder, most recent episode depressed (HCC) [F31.30]    1. Bipolar I disorder, most recent episode depressed (HCC)   2. Prolonged grief disorder       Donia Guiles, LCSW

## 2022-12-26 NOTE — Psych (Signed)
Virtual Visit via Video Note  I connected with Stacey Weaver on 12/17/22 at  9:00 AM EDT by a video enabled telemedicine application and verified that I am speaking with the correct person using two identifiers.  Location: Patient: patient home Provider: clinical home office   I discussed the limitations of evaluation and management by telemedicine and the availability of in person appointments. The patient expressed understanding and agreed to proceed.  I discussed the assessment and treatment plan with the patient. The patient was provided an opportunity to ask questions and all were answered. The patient agreed with the plan and demonstrated an understanding of the instructions.   The patient was advised to call back or seek an in-person evaluation if the symptoms worsen or if the condition fails to improve as anticipated.  Pt was provided 240 minutes of non-face-to-face time during this encounter.   Donia Guiles, LCSW   [facility] PHP THERAPIST PROGRESS NOTE  Stacey Weaver [id number]  Session Time: 9:00 - 10:00  Participation Level: Active  Behavioral Response: CasualAlertAnxious and Depressed  Type of Therapy: Group Therapy  Treatment Goals addressed: Coping  Progress Towards Goals: Initial  Interventions: CBT, DBT, Supportive, and Reframing  Summary: Stacey Weaver is a 47 y.o. female who presents with mood symptoms and grief.  Clinician led check-in regarding current stressors and situation, and review of patient completed daily inventory. Clinician utilized active listening and empathetic response and validated patient emotions. Clinician facilitated processing group on pertinent issues.?    Therapist Response:  Patient arrived within time allowed. Patient rates her mood at a 2 on a scale of 1-10 with 10 being best. Pt states she feels "extremely annoyed." Pt states she slept 1 broken hours and ate 0x. Pt reports she continues to not sleep and is  frustrated that "no one is helping her." Cln encouraged pt to remember that change takes time and there is not a magic pill. Pt reports her feelings are "all over the place" and she is over-thinking and ruminating . Pt share she has good support but is not utilizing them because she is tired of people not getting it and/or saying the wrong thing, or feeling like a burden. Pt states she is dedicated to completing the group because she often stops treatment in the middle, however she continues to be skeptical that anything will help. Patient able to process. Patient engaged in discussion. Pt de-escalates as group progresses and she is able to process and accept feedback/support.          Session Time: 10:00 am - 11:00 am   Participation Level: Active   Behavioral Response: CasualAlertDepressed   Type of Therapy: Group Therapy   Treatment Goals addressed: Coping   Progress Towards Goals: Progressing   Interventions: CBT, DBT, Solution Focused, Strength-based, Supportive, and Reframing   Therapist Response: Cln led discussion on CBT thinking error: mind reading. Cln worked with group members to identify examples of mind reading and the consequences that can come. Group shared ways in which mind reading has been an issue for them and barriers to working on it.    Therapist Response:   Pt engaged in discussion and reports understanding of mind reading.           Session Time: 11:00 -12:00   Participation Level: Active   Behavioral Response: CasualAlertDepressed   Type of Therapy: Group Therapy   Treatment Goals addressed: Coping   Progress Towards Goals: Progressing   Interventions: CBT, DBT, Solution  Focused, Strength-based, Supportive, and Reframing   Summary: Cln led discussion on CBT reframing and how to look for alternative possibilities to decrease distress from negative thinking. Cln utilized the CBT triangle to aid discussion. Group members shared difficult situations and  worked with group to reframe the negative thinking present.    Therapist Response: Pt engaged in Engineer, manufacturing.          Session Time: 12:00 -1:00   Participation Level: Active   Behavioral Response: CasualAlertDepressed   Type of Therapy: Group therapy   Treatment Goals addressed: Coping   Progress Towards Goals: Progressing   Interventions: OT group   Summary: 12:00 - 12:50: Occupational Therapy group with cln E. Hollan.  12:50 - 1:00 Clinician assessed for immediate needs, medication compliance and efficacy, and safety concerns.   Therapist Response: 12:00 - 12:50: See note 12:50 - 1:00 pm: At check-out, patient reports no immediate concerns. Patient demonstrates progress as evidenced by continued engagement and responsiveness to treatment. Patient denies SI/HI/self-harm thoughts at the end of group.     Suicidal/Homicidal: Nowithout intent/plan  Plan: Pt will continue in PHP while working to decrease depression/anxiety/grief symptoms, increase emotion regulation, and increase ability to manage symptoms in a healthy manner.   Collaboration of Care: Medication Management AEB T Lewis  Patient/Guardian was advised Release of Information must be obtained prior to any record release in order to collaborate their care with an outside provider. Patient/Guardian was advised if they have not already done so to contact the registration department to sign all necessary forms in order for [place] to release information regarding their care.   Consent: Patient/Guardian gives verbal consent for treatment and assignment of benefits for services provided during this visit. Patient/Guardian expressed understanding and agreed to proceed.   Diagnosis: Bipolar I disorder, most recent episode depressed (HCC) [F31.30]    1. Bipolar I disorder, most recent episode depressed (HCC)   2. Prolonged grief disorder       Donia Guiles, LCSW

## 2022-12-26 NOTE — Psych (Signed)
Virtual Visit via Video Note  I connected with Stacey Weaver on 12/16/22 at  9:00 AM EDT by a video enabled telemedicine application and verified that I am speaking with the correct person using two identifiers.  Location: Patient: patient home Provider: clinical home office   I discussed the limitations of evaluation and management by telemedicine and the availability of in person appointments. The patient expressed understanding and agreed to proceed.  I discussed the assessment and treatment plan with the patient. The patient was provided an opportunity to ask questions and all were answered. The patient agreed with the plan and demonstrated an understanding of the instructions.   The patient was advised to call back or seek an in-person evaluation if the symptoms worsen or if the condition fails to improve as anticipated.  Pt was provided 240 minutes of non-face-to-face time during this encounter.   Donia Guiles, LCSW   [facility] PHP THERAPIST PROGRESS NOTE  Stacey Weaver [id number]  Session Time: 9:00 - 10:00  Participation Level: Active  Behavioral Response: CasualAlertAnxious and Depressed  Type of Therapy: Group Therapy  Treatment Goals addressed: Coping  Progress Towards Goals: Initial  Interventions: CBT, DBT, Supportive, and Reframing  Summary: Stacey Weaver is a 47 y.o. female who presents with mood symptoms and grief.  Clinician led check-in regarding current stressors and situation, and review of patient completed daily inventory. Clinician utilized active listening and empathetic response and validated patient emotions. Clinician facilitated processing group on pertinent issues.?    Therapist Response:  Patient arrived within time allowed. Patient rates her mood at a 2 on a scale of 1-10 with 10 being best. Pt states she feels "exhausted." Pt states she slept 2 broken hours and ate 0x. Pt reports she is tired of being tired and does not think  anything can help her. Pt reports she does not believe therapy is helpful and thinks doctors cannot help her as she has been asking for help and "not getting it." Pt reports high grief symptoms and states no one can understand because no one had the close relationship she had with her mom and grandmother. Pt reports trauma symptoms relating to her mom/grandmother's deaths. Pt states she has racing thoughts and is not sleeping. Pt reports working 12 hours shifts and will continue to work because she cannot be alone with her mind. Pt identifies passive SI and states she promised her mom she would take care of her brother so she will never act on any ideations. Patient able to process. Patient engaged in discussion.            Session Time: 10:00 am - 11:00 am   Participation Level: Active   Behavioral Response: CasualAlertDepressed   Type of Therapy: Group Therapy   Treatment Goals addressed: Coping   Progress Towards Goals: Progressing   Interventions: CBT, DBT, Solution Focused, Strength-based, Supportive, and Reframing   Therapist Response: Cln led discussion on impulsivity. Group discussed struggles with impulsivity. Cln highlighted theme of immediacy. Group built insight around the way immediacy interacts with impulsivity. Cln encouraged pt's to consider mantras and grounding statements to remind themselves there is time to think/feel/act.    Therapist Response:   Pt engaged in discussion and is able to make connections and gain insight.          Session Time: 11:00 -12:00   Participation Level: Active   Behavioral Response: CasualAlertDepressed   Type of Therapy: Group Therapy   Treatment Goals addressed: Coping  Progress Towards Goals: Progressing   Interventions: CBT, DBT, Solution Focused, Strength-based, Supportive, and Reframing   Summary: Cln led discussion on unknowns and the way they impact our anxiety. Cln discussed cognitive restructuring, DBT distraction  skills, and positive mantras/prayer as ways to address the anxiety. Cln discussed idea "I can do hard things" and ways to bolster confidence in our ability to handle difficult situations.    Therapist Response:  Pt shared current unknowns they are dealing with and is able to identify ways to manage.          Session Time: 12:00 -1:00   Participation Level: Active   Behavioral Response: CasualAlertDepressed   Type of Therapy: Group therapy   Treatment Goals addressed: Coping   Progress Towards Goals: Progressing   Interventions: OT group   Summary: 12:00 - 12:50: Occupational Therapy group with cln E. Hollan.  12:50 - 1:00 Clinician assessed for immediate needs, medication compliance and efficacy, and safety concerns.   Therapist Response: 12:00 - 12:50: See note 12:50 - 1:00 pm: At check-out, patient reports no immediate concerns. Patient demonstrates progress as evidenced by participating in first group session. Patient denies SI/HI/self-harm thoughts at the end of group.     Suicidal/Homicidal: Nowithout intent/plan  Plan: Pt will continue in PHP while working to decrease depression/anxiety/grief symptoms, increase emotion regulation, and increase ability to manage symptoms in a healthy manner.   Collaboration of Care: Medication Management AEB T Lewis  Patient/Guardian was advised Release of Information must be obtained prior to any record release in order to collaborate their care with an outside provider. Patient/Guardian was advised if they have not already done so to contact the registration department to sign all necessary forms in order for [place] to release information regarding their care.   Consent: Patient/Guardian gives verbal consent for treatment and assignment of benefits for services provided during this visit. Patient/Guardian expressed understanding and agreed to proceed.   Diagnosis: Bipolar I disorder, most recent episode depressed (HCC) [F31.30]    1.  Bipolar I disorder, most recent episode depressed (HCC)   2. Prolonged grief disorder       Donia Guiles, LCSW

## 2022-12-26 NOTE — Psych (Signed)
Virtual Visit via Video Note  I connected with Stacey Weaver on 12/21/22 at  9:00 AM EDT by a video enabled telemedicine application and verified that I am speaking with the correct person using two identifiers.  Location: Patient: patient home Provider: clinical home office   I discussed the limitations of evaluation and management by telemedicine and the availability of in person appointments. The patient expressed understanding and agreed to proceed.  I discussed the assessment and treatment plan with the patient. The patient was provided an opportunity to ask questions and all were answered. The patient agreed with the plan and demonstrated an understanding of the instructions.   The patient was advised to call back or seek an in-person evaluation if the symptoms worsen or if the condition fails to improve as anticipated.  Pt was provided 240 minutes of non-face-to-face time during this encounter.   Donia Guiles, LCSW   [facility] PHP THERAPIST PROGRESS NOTE  Caralee Morea [id number]  Session Time: 9:00 - 10:00  Participation Level: Active  Behavioral Response: CasualAlertAnxious and Depressed  Type of Therapy: Group Therapy  Treatment Goals addressed: Coping  Progress Towards Goals: Progressing  Interventions: CBT, DBT, Supportive, and Reframing  Summary: Stacey Weaver is a 47 y.o. female who presents with mood symptoms and grief.  Clinician led check-in regarding current stressors and situation, and review of patient completed daily inventory. Clinician utilized active listening and empathetic response and validated patient emotions. Clinician facilitated processing group on pertinent issues.?    Therapist Response:  Patient arrived within time allowed. Patient rates her mood at a 5 on a scale of 1-10 with 10 being best. Pt states she feels "okay." Pt states she slept 2.5 broken hours and ate 2x. Pt reports work was "hard" and she struggled to stay  present and manage racing thoughts. Pt reports feeling disappointed because work is normally the only time she is not dealing with racing thoughts. Pt states she talked to her cousin on the phone for 2 hours and shared more than she normally would. Pt states it was draining but she enjoyed the call and it was "ok" to share more. Pt reports continued frustration re: lack of sleep and not having a medication that makes her sleep.  Patient able to process. Patient engaged in discussion.          Session Time: 10:00 am - 11:00 am   Participation Level: Active   Behavioral Response: CasualAlertDepressed   Type of Therapy: Group Therapy   Treatment Goals addressed: Coping   Progress Towards Goals: Progressing   Interventions: CBT, DBT, Solution Focused, Strength-based, Supportive, and Reframing   Therapist Response: Cln introduced DBT distress tolerance distraction skills. Cln provides context for distraction skills and why distraction is a foundational way to manage mood dysregulation. Group discussed how to apply distraction.    Therapist Response:  Pt engaged in discussion and reports understanding of how distraction can be applied to manage big feelings.         Session Time: 11:00 -12:00   Participation Level: Active   Behavioral Response: CasualAlertDepressed   Type of Therapy: Group Therapy   Treatment Goals addressed: Coping   Progress Towards Goals: Progressing   Interventions: CBT, DBT, Solution Focused, Strength-based, Supportive, and Reframing   Summary: Cln continued topic of DBT distress tolerance skills. Cln introduced the ACCEPTS distraction skills and group discusses ways to utilize "A" activities.    Therapist Response: Pt engaged in discussion and is able to  state ways to apply this skill.          Session Time: 12:00 -1:00   Participation Level: Active   Behavioral Response: CasualAlertDepressed   Type of Therapy: Group therapy   Treatment Goals  addressed: Coping   Progress Towards Goals: Progressing   Interventions: OT group   Summary: 12:00 - 12:50: Occupational Therapy group with cln E. Hollan.  12:50 - 1:00 Clinician assessed for immediate needs, medication compliance and efficacy, and safety concerns.   Therapist Response: 12:00 - 12:50: See note 12:50 - 1:00 pm: At check-out, patient reports no immediate concerns. Patient demonstrates progress as evidenced by continued engagement and responsiveness to treatment. Patient denies SI/HI/self-harm thoughts at the end of group.     Suicidal/Homicidal: Nowithout intent/plan  Plan: Pt will continue in PHP while working to decrease depression/anxiety/grief symptoms, increase emotion regulation, and increase ability to manage symptoms in a healthy manner.   Collaboration of Care: Medication Management AEB T Lewis  Patient/Guardian was advised Release of Information must be obtained prior to any record release in order to collaborate their care with an outside provider. Patient/Guardian was advised if they have not already done so to contact the registration department to sign all necessary forms in order for [place] to release information regarding their care.   Consent: Patient/Guardian gives verbal consent for treatment and assignment of benefits for services provided during this visit. Patient/Guardian expressed understanding and agreed to proceed.   Diagnosis: Bipolar I disorder, most recent episode depressed (HCC) [F31.30]    1. Bipolar I disorder, most recent episode depressed (HCC)   2. Prolonged grief disorder       Donia Guiles, LCSW

## 2022-12-27 ENCOUNTER — Other Ambulatory Visit (HOSPITAL_COMMUNITY): Payer: No Typology Code available for payment source | Admitting: Licensed Clinical Social Worker

## 2022-12-27 ENCOUNTER — Encounter (HOSPITAL_COMMUNITY): Payer: Self-pay

## 2022-12-27 ENCOUNTER — Other Ambulatory Visit (HOSPITAL_COMMUNITY): Payer: No Typology Code available for payment source

## 2022-12-27 ENCOUNTER — Encounter (HOSPITAL_COMMUNITY): Payer: Self-pay | Admitting: Family

## 2022-12-27 DIAGNOSIS — R4589 Other symptoms and signs involving emotional state: Secondary | ICD-10-CM

## 2022-12-27 DIAGNOSIS — F3130 Bipolar disorder, current episode depressed, mild or moderate severity, unspecified: Secondary | ICD-10-CM

## 2022-12-27 DIAGNOSIS — F4381 Prolonged grief disorder: Secondary | ICD-10-CM

## 2022-12-27 DIAGNOSIS — G4709 Other insomnia: Secondary | ICD-10-CM

## 2022-12-27 MED ORDER — ZOLPIDEM TARTRATE ER 12.5 MG PO TBCR: 12.5000 mg | EXTENDED_RELEASE_TABLET | Freq: Every evening | ORAL | 0 refills | Status: DC | PRN

## 2022-12-27 NOTE — Psych (Signed)
Virtual Visit via Video Note  I connected with Stacey Weaver on 12/23/22 at  9:00 AM EDT by a video enabled telemedicine application and verified that I am speaking with the correct person using two identifiers.  Location: Patient: patient home Provider: clinical home office   I discussed the limitations of evaluation and management by telemedicine and the availability of in person appointments. The patient expressed understanding and agreed to proceed.  I discussed the assessment and treatment plan with the patient. The patient was provided an opportunity to ask questions and all were answered. The patient agreed with the plan and demonstrated an understanding of the instructions.   The patient was advised to call back or seek an in-person evaluation if the symptoms worsen or if the condition fails to improve as anticipated.  Pt was provided 240 minutes of non-face-to-face time during this encounter.   Donia Guiles, LCSW   [facility] PHP THERAPIST PROGRESS NOTE  Stacey Weaver [id number]  Session Time: 9:00 - 10:00  Participation Level: Active  Behavioral Response: CasualAlertAnxious and Depressed  Type of Therapy: Group Therapy  Treatment Goals addressed: Coping  Progress Towards Goals: Progressing  Interventions: CBT, DBT, Supportive, and Reframing  Summary: Stacey Weaver is a 47 y.o. female who presents with mood symptoms and grief.  Clinician led check-in regarding current stressors and situation, and review of patient completed daily inventory. Clinician utilized active listening and empathetic response and validated patient emotions. Clinician facilitated processing group on pertinent issues.?    Therapist Response:  Patient arrived within time allowed. Patient rates her mood at a 5 on a scale of 1-10 with 10 being best. Pt states she feels "exhausted." Pt states she slept 4.5 hours and ate 1x. Pt reports she took her sleep medication in addition to 5  benadryls in order to get the sleep that she did. Pt reports continued frustration with medications not touching her insomnia. Pt reports feeling "very emotional" after her friend's death and all the unresolved grief already carried. Pt reports crying without being able to stop. Pt states "I think one more thing will cause a mental breakdown."  Pt demonstrates progress in sharing and openness in session. Patient able to process. Patient engaged in discussion.          Session Time: 10:00 am - 11:00 am   Participation Level: Active   Behavioral Response: CasualAlertDepressed   Type of Therapy: Group Therapy   Treatment Goals addressed: Coping   Progress Towards Goals: Progressing   Interventions: CBT, DBT, Solution Focused, Strength-based, Supportive, and Reframing   Therapist Response: Cln facilitated processing group around difficult relationships. Group members shared struggles they face in relationships. Cln brought in topics of self-esteem, CBT thought challenging, boundaries, and communication to aid growth.   Therapist Response: Pt engaged in discussion and is able to process.           Session Time: 11:00 -12:00   Participation Level: Active   Behavioral Response: CasualAlertDepressed   Type of Therapy: Group Therapy   Treatment Goals addressed: Coping   Progress Towards Goals: Progressing   Interventions: CBT, DBT, Solution Focused, Strength-based, Supportive, and Reframing   Summary: Cln continued topic of DBT distress tolerance skills and the ACCEPTS distraction skill. Group reviewed C-C skills and discussed how they can practice them in their every day life.    Therapist Response: Pt engaged in discussion and is able to state ways to apply these skills.  Session Time: 12:00 -1:00   Participation Level: Active   Behavioral Response: CasualAlertDepressed   Type of Therapy: Group therapy   Treatment Goals addressed: Coping   Progress Towards  Goals: Progressing   Interventions: OT group   Summary: 12:00 - 12:50: Occupational Therapy group with cln E. Hollan.  12:50 - 1:00 Clinician assessed for immediate needs, medication compliance and efficacy, and safety concerns.   Therapist Response: 12:00 - 12:50: See note 12:50 - 1:00 pm: At check-out, patient reports no immediate concerns. Patient demonstrates progress as evidenced by continued engagement and responsiveness to treatment. Patient denies SI/HI/self-harm thoughts at the end of group.     Suicidal/Homicidal: Nowithout intent/plan  Plan: Pt will continue in PHP while working to decrease depression/anxiety/grief symptoms, increase emotion regulation, and increase ability to manage symptoms in a healthy manner.   Collaboration of Care: Medication Management AEB T Lewis  Patient/Guardian was advised Release of Information must be obtained prior to any record release in order to collaborate their care with an outside provider. Patient/Guardian was advised if they have not already done so to contact the registration department to sign all necessary forms in order for [place] to release information regarding their care.   Consent: Patient/Guardian gives verbal consent for treatment and assignment of benefits for services provided during this visit. Patient/Guardian expressed understanding and agreed to proceed.   Diagnosis: Bipolar I disorder, most recent episode depressed (HCC) [F31.30]    1. Bipolar I disorder, most recent episode depressed (HCC)   2. Prolonged grief disorder       Donia Guiles, LCSW

## 2022-12-27 NOTE — Psych (Signed)
Virtual Visit via Video Note  I connected with Maurina Billey Co on 12/27/22 at  9:00 AM EDT by a video enabled telemedicine application and verified that I am speaking with the correct person using two identifiers.  Location: Patient: patient home Provider: clinical home office   I discussed the limitations of evaluation and management by telemedicine and the availability of in person appointments. The patient expressed understanding and agreed to proceed.  I discussed the assessment and treatment plan with the patient. The patient was provided an opportunity to ask questions and all were answered. The patient agreed with the plan and demonstrated an understanding of the instructions.   The patient was advised to call back or seek an in-person evaluation if the symptoms worsen or if the condition fails to improve as anticipated.  Pt was provided 240 minutes of non-face-to-face time during this encounter.   Donia Guiles, LCSW   [facility] PHP THERAPIST PROGRESS NOTE  Candi Profit [id number]  Session Time: 9:00 - 10:00  Participation Level: Active  Behavioral Response: CasualAlertAnxious and Depressed  Type of Therapy: Group Therapy  Treatment Goals addressed: Coping  Progress Towards Goals: Progressing  Interventions: CBT, DBT, Supportive, and Reframing  Summary: Yazmyn Ayslin Kundert is a 47 y.o. female who presents with mood symptoms and grief.  Clinician led check-in regarding current stressors and situation, and review of patient completed daily inventory. Clinician utilized active listening and empathetic response and validated patient emotions. Clinician facilitated processing group on pertinent issues.?    Therapist Response:  Patient arrived within time allowed. Patient rates her mood at a 5 on a scale of 1-10 with 10 being best. Pt states she feels "a lot." Pt states she slept 3.5 hours and ate 2x. Pt reports protein shakes continue to be the majority of what  she consumes due to lack of appetite. Pt reports feeling "ignored" due to her sleep not improving through medication. Pt states she worked through the weekend and it was more emotionally draining than normal. Pt shares her friends showed up unannounced to her house on Saturday for an "intervention" because she has not been communicating. Pt states she was initially upset but tried to focus on the feeling of love and care. Pt states she sent them home without talking to them, yet later communicated through text. Pt identifies passive SI and denies plan/intent.  Patient able to process. Patient engaged in discussion.          Session Time: 10:00 am - 11:00 am   Participation Level: Active   Behavioral Response: CasualAlertDepressed   Type of Therapy: Group Therapy   Treatment Goals addressed: Coping   Progress Towards Goals: Progressing   Interventions: CBT, DBT, Solution Focused, Strength-based, Supportive, and Reframing   Therapist Response: Cln led processing group for pt's current struggles. Group members shared stressors and provided support and feedback. Cln brought in topics of boundaries, healthy relationships, and unhealthy thought processes to inform discussion.    Therapist Response:  Pt able to process and provide support to group.        Session Time: 11:00 -12:00   Participation Level: Active   Behavioral Response: CasualAlertDepressed   Type of Therapy: Group Therapy   Treatment Goals addressed: Coping   Progress Towards Goals: Progressing   Interventions: CBT, DBT, Solution Focused, Strength-based, Supportive, and Reframing   Summary: Cln led discussion on healthy relationships. Group members shared issues they have experienced in past relationships and in identifying what "healthy" looks like. Cln  discussed respect, trust, and honesty as non-negotiable traits in a healthy dynamic. Group shared and problem solved barriers to recognizing and prioritizing these  traits.    Therapist Response: Pt engaged in discussion and is able to process.         Session Time: 12:00 -1:00   Participation Level: Active   Behavioral Response: CasualAlertDepressed   Type of Therapy: Group therapy   Treatment Goals addressed: Coping   Progress Towards Goals: Progressing   Interventions: OT group   Summary: 12:00 - 12:50: Occupational Therapy group with cln E. Hollan.  12:50 - 1:00 Clinician assessed for immediate needs, medication compliance and efficacy, and safety concerns.   Therapist Response: 12:00 - 12:50: See note 12:50 - 1:00 pm: At check-out, patient reports no immediate concerns. Patient demonstrates progress as evidenced by continued engagement and responsiveness to treatment. Patient denies SI/HI/self-harm thoughts at the end of group.     Suicidal/Homicidal: Nowithout intent/plan  Plan: Pt will continue in PHP while working to decrease depression/anxiety/grief symptoms, increase emotion regulation, and increase ability to manage symptoms in a healthy manner.   Collaboration of Care: Medication Management AEB T Lewis  Patient/Guardian was advised Release of Information must be obtained prior to any record release in order to collaborate their care with an outside provider. Patient/Guardian was advised if they have not already done so to contact the registration department to sign all necessary forms in order for [place] to release information regarding their care.   Consent: Patient/Guardian gives verbal consent for treatment and assignment of benefits for services provided during this visit. Patient/Guardian expressed understanding and agreed to proceed.   Diagnosis: Other insomnia [G47.09]    1. Other insomnia   2. Bipolar I disorder, most recent episode depressed (HCC)   3. Prolonged grief disorder       Donia Guiles, LCSW

## 2022-12-27 NOTE — Progress Notes (Signed)
Virtual Visit via Video Note  I connected with [name] on 12/27/22 at  9:00 AM EDT by a video enabled telemedicine application and verified that I am speaking with the correct person using two identifiers.  Location: Patient: Home Provider: Office   I discussed the limitations of evaluation and management by telemedicine and the availability of in person appointments. The patient expressed understanding and agreed to proceed.    I discussed the assessment and treatment plan with the patient. The patient was provided an opportunity to ask questions and all were answered. The patient agreed with the plan and demonstrated an understanding of the instructions.   The patient was advised to call back or seek an in-person evaluation if the symptoms worsen or if the condition fails to improve as anticipated.  I provided 15 minutes of non-face-to-face time during this encounter.   Stacey Rack, NP   [facility] MD/PA/NP OP Progress Note  12/27/2022 10:33 AM Stacey Weaver  MRN:  [id number]  Chief Complaint: Sleeping concerns  HPI: Stacey Weaver 47 year old African-American female was seen and evaluated via video telehealth assessment.  She was seen and evaluated by this provider and attending psychiatrist Lucianne Muss.  Patient continues to endorse symptoms related to insomnia.  States more recently she has been having "night terrors."  States her symptoms started after the passing of her mother.  States her nightmares/night terrors have become more intrusive and not allowing her to sleep well at night.  "  Afraid to fall asleep."  states she had concerns with taking Ambien due to worsening of night terrors.  Patient continues to report taken multiple sleep medications in the past without relief.  Patient discussed with attending psychiatrist Lucianne Muss increasing Ambien 6.25 mg to 12.5 milligrams nightly coupled with trazodone 300 mg nightly.  Patient to start taking medications about 8 PM.  She  was receptive to plan.  Discussed consideration for initiating Prazosin 1-2 mg nigltly at follow-up appointment.  Support,encouragement and  reassurance was provided.  During evaluation Takelia Shante Beshears is sitting; she is alert/oriented x 4; calm/cooperative; and mood congruent with affect.  Patient is speaking in a clear tone at moderate volume, and normal pace; with good eye contact. Her thought process is coherent and relevant; There is no indication that she is currently responding to internal/external stimuli or experiencing delusional thought content.  Patient denies suicidal/self-harm/homicidal ideation, psychosis, and paranoia.  Patient has remained calm throughout assessment and has answered questions appropriately.   Visit Diagnosis:    ICD-10-CM   1. Other insomnia  G47.09       Past Psychiatric History:   Past Medical History:  Past Medical History:  Diagnosis Date   Anxiety    Asthma    Bipolar 1 disorder (HCC)    Migraine     Past Surgical History:  Procedure Laterality Date   KNEE SURGERY      Family Psychiatric History:   Family History:  Family History  Problem Relation Age of Onset   Hyperlipidemia Mother    Hypertension Mother    Heart disease Father 72       CAD   Hyperlipidemia Maternal Grandmother    Cancer Maternal Grandmother    Cancer Maternal Grandfather    Hyperlipidemia Brother     Social History:  Social History   Socioeconomic History   Marital status: Single    Spouse name: Not on file   Number of children: Not on file   Years of education: Not  on file   Highest education level: Not on file  Occupational History   Not on file  Tobacco Use   Smoking status: Never   Smokeless tobacco: Never  Substance and Sexual Activity   Alcohol use: No   Drug use: No   Sexual activity: Yes    Birth control/protection: I.U.D.  Other Topics Concern   Not on file  Social History Narrative   Not on file   Social Determinants of Health    Financial Resource Strain: Not on file  Food Insecurity: Medium Risk (11/04/2022)   Received from [facility]   Hunger Vital Sign    Worried About Running Out of Food in the Last Year: Sometimes true    Ran Out of Food in the Last Year: Sometimes true  Transportation Needs: Not on file (11/04/2022)  Physical Activity: Not on file  Stress: Not on file  Social Connections: Unknown (08/04/2021)   Received from [facility]   Social Network    Social Network: Not on file    Allergies:  Allergies  Allergen Reactions   Almotriptan Malate Shortness Of Breath and Other (See Comments)    Muscle spasms   Cymbalta [Duloxetine Hcl] Other (See Comments)    Other reaction(s): Other Muscle spasms suicidal thoughts   Imitrex [Sumatriptan] Shortness Of Breath, Other (See Comments) and Palpitations    Muscle spasms   Prednisone Other (See Comments) and Palpitations   Zomig Shortness Of Breath   Geodon [Ziprasidone Hcl] Other (See Comments)    hallucinations   Lactose Intolerance (Gi) Diarrhea and Other (See Comments)    Gas and bloated    Penicillins Other (See Comments)    GI upset  Has patient had a PCN reaction causing immediate rash, facial/tongue/throat swelling, SOB or lightheadedness with hypotension: No Has patient had a PCN reaction causing severe rash involving mucus membranes or skin necrosis: No Has patient had a PCN reaction that required hospitalization No Has patient had a PCN reaction occurring within the last 10 years: Yes If all of the above answers are "NO", then may proceed with Cephalosporin use.    Zyprexa [Olanzapine] Other (See Comments)    Hallucinations     Metabolic Disorder Labs: No results found for: "HGBA1C", "MPG" No results found for: "PROLACTIN" Lab Results  Component Value Date   CHOL 250 (H) 01/29/2016   TRIG 87.0 01/29/2016   HDL 65.40 01/29/2016   CHOLHDL 4 01/29/2016   VLDL 17.4 01/29/2016   LDLCALC 167 (H) 01/29/2016   Lab  Results  Component Value Date   TSH 1.36 06/30/2015   TSH 4.132 08/24/2011    Therapeutic Level Labs: No results found for: "LITHIUM" Lab Results  Component Value Date   VALPROATE 32.4 (L) 10/14/2009   VALPROATE 68.2 10/19/2008   Lab Results  Component Value Date   CBMZ 8.7 10/14/2015    Current Medications: Current Outpatient Medications  Medication Sig Dispense Refill   zolpidem (AMBIEN CR) 12.5 MG CR tablet Take 1 tablet (12.5 mg total) by mouth at bedtime as needed for sleep. 10 tablet 0   AIMOVIG 140 MG/ML SOAJ Inject into the skin.     ALPRAZolam (XANAX) 0.25 MG tablet Take 0.25 mg by mouth at bedtime as needed for anxiety.      atorvastatin (LIPITOR) 10 MG tablet Take 10 mg by mouth daily.     carbamazepine (TEGRETOL XR) 200 MG 12 hr tablet Take 200 mg by mouth 2 (two) times daily.  carbamazepine (TEGRETOL) 200 MG tablet Take 1 tablet (200 mg total) by mouth 2 (two) times daily. 60 tablet 0   clonazePAM (KLONOPIN) 0.5 MG tablet Take by mouth.     dicyclomine (BENTYL) 20 MG tablet Take 1 tablet (20 mg total) by mouth 2 (two) times daily. 20 tablet 0   famotidine (PEPCID) 20 MG tablet Take 1 tablet (20 mg total) by mouth 2 (two) times daily. 30 tablet 0   HYDROcodone-acetaminophen (NORCO/VICODIN) 5-325 MG tablet Take 1 tablet by mouth every 4 (four) hours as needed. 10 tablet 0   ibuprofen (ADVIL) 800 MG tablet Take 1 tablet (800 mg total) by mouth 3 (three) times daily. 21 tablet 0   lamoTRIgine (LAMICTAL) 25 MG tablet Take 1 tablet (25 mg total) by mouth 2 (two) times daily. 60 tablet 0   methocarbamol (ROBAXIN) 500 MG tablet Take 1 tablet (500 mg total) by mouth 2 (two) times daily as needed for up to 15 doses for muscle spasms. 15 tablet 0   metoCLOPramide (REGLAN) 10 MG tablet Take 1 tablet (10 mg total) by mouth every 6 (six) hours. 30 tablet 0   metroNIDAZOLE (FLAGYL) 500 MG tablet Take 500 mg by mouth 2 (two) times daily.     ondansetron (ZOFRAN) 4 MG tablet Take 1  tablet (4 mg total) by mouth every 4 (four) hours as needed for nausea or vomiting. 6 tablet 0   phentermine 15 MG capsule Take 15 mg by mouth daily.     potassium chloride (KLOR-CON) 10 MEQ tablet Take 1 tablet (10 mEq total) by mouth daily for 30 doses. 30 tablet 0   prochlorperazine (COMPAZINE) 10 MG tablet Take 1 tablet (10 mg total) by mouth 2 (two) times daily as needed for nausea or vomiting. 10 tablet 0   QUEtiapine (SEROQUEL) 200 MG tablet Take 200 mg by mouth 2 (two) times daily.     sertraline (ZOLOFT) 50 MG tablet Take 50 mg by mouth 2 (two) times daily.     traZODone (DESYREL) 100 MG tablet Take 3 tablets by mouth at bedtime.     UBRELVY 100 MG TABS Take by mouth.     WEGOVY 0.5 MG/0.5ML SOAJ Inject 0.5 mg into the skin once a week.     No current facility-administered medications for this visit.     Musculoskeletal:  Psychiatric Specialty Exam: Review of Systems  Psychiatric/Behavioral:  Positive for agitation and sleep disturbance.   All other systems reviewed and are negative.   There were no vitals taken for this visit.There is no height or weight on file to calculate BMI.  General Appearance: Casual  Eye Contact:  Good  Speech:  Clear and Coherent  Volume:  Normal  Mood:  Anxious and Depressed  Affect:  Congruent  Thought Process:  Coherent  Orientation:  Full (Time, Place, and Person)  Thought Content: Logical   Suicidal Thoughts:  No  Homicidal Thoughts:  No  Memory:  Immediate;   Good Recent;   Good  Judgement:  Good  Insight:  Good  Psychomotor Activity:  Normal  Concentration:  Concentration: Good  Recall:  Good  Fund of Knowledge: Good  Language: Good  Akathisia:  No  Handed:  Right  AIMS (if indicated): not done  Assets:  Communication Skills Desire for Improvement Social Support  ADL's:  Intact  Cognition: WNL  Sleep:  Poor   Screenings: AIMS    Flowsheet Row [facility]  AIMS Total Score 0      AUDIT    Flowsheet Row [facility]  Alcohol Use Disorder Identification Test Final Score (AUDIT) 0      PHQ2-9    Flowsheet Row Counselor from 12/09/2022 in [facility] PARTIAL HOSPITALIZATION PROGRAM Office Visit from 02/12/2016 in Primary Care at [facility] Visit from 06/30/2015 in Primary Care at [facility] Visit from 12/02/2014 in Primary Care at [facility] Visit from 09/18/2014 in Primary Care at [facility] Total Score 6 0 2 0 0  PHQ-9 Total Score 20 -- 19 -- --      Flowsheet Row Counselor from 12/09/2022 in [facility]IORAL [facility]ALTH PARTIAL HOSPITALIZATION PROGRAM ED from 12/06/2022 in [facility] ED from 08/11/2022 in [facility] Emergency Department at [facility]  C-SSRS RISK CATEGORY Low Risk No Risk No Risk        Assessment and Plan:  Continue partial hospitalization programming (PHP) Increase Ambien 6.25-12.5 nightly -Consideration for initiating Prazosin 1-2 mg nightly  Collaboration of Care: Collaboration of Care: Medication Management AEB see above  Patient/Guardian was advised Release of Information must be obtained prior to any record release in order to collaborate their care with an outside provider. Patient/Guardian was advised if they have not already done so to contact the registration department to sign all necessary forms in order for [place] to release information regarding their care.   Consent: Patient/Guardian gives verbal consent for treatment and assignment of benefits for services provided during this visit. Patient/Guardian expressed understanding and agreed to proceed.    Stacey Rack, NP 12/27/2022, 10:33 AM

## 2022-12-27 NOTE — Psych (Signed)
Pt did not attend PHP session due to needing to sleep. Pt made contact and is safe.

## 2022-12-27 NOTE — Therapy (Signed)
[facility] PARTIAL HOSPITALIZATION PROGRAM [address] [place], [place], [address] Phone: [phone]   Fax:  [phone]  Occupational Therapy Treatment Virtual Visit via Video Note  I connected with Stacey Weaver on 12/27/22 at  8:00 AM EDT by a video enabled telemedicine application and verified that I am speaking with the correct person using two identifiers.  Location: Patient: home Provider: office   I discussed the limitations of evaluation and management by telemedicine and the availability of in person appointments. The patient expressed understanding and agreed to proceed.    The patient was advised to call back or seek an in-person evaluation if the symptoms worsen or if the condition fails to improve as anticipated.  I provided 55 minutes of non-face-to-face time during this encounter.   Patient Details  Name: Stacey Weaver MRN: [id number] Date of Birth: 05/23/1975 No data recorded  Encounter Date: 12/23/2022   OT End of Session - 12/27/22 0759     Visit Number 5    Number of Visits 20    Date for OT Re-Evaluation 01/16/23    OT Start Time 1200    OT Stop Time 1255    OT Time Calculation (min) 55 min             Past Medical History:  Diagnosis Date   Anxiety    Asthma    Bipolar 1 disorder (HCC)    Migraine     Past Surgical History:  Procedure Laterality Date   KNEE SURGERY      There were no vitals filed for this visit.   Subjective Assessment - 12/27/22 0759     Currently in Pain? No/denies    Pain Score 0-No pain               Group Session:  S: Doing better today.   O: The objective of the telehealth group therapy session was to discuss the significance of routines in promoting mental health and wellbeing. The OT aimed to explore the power of routines in providing structure, stability, and predictability in individuals' lives, as well as their role in establishing healthy habits,  reducing stress and anxiety, managing time effectively, achieving goals, and fostering a sense of community and social connectedness.   Participants were guided to identify areas where routines could improve their mental health and were provided with tips for establishing and maintaining healthy routines. The session concluded by emphasizing the potential of routines to enhance overall quality of life.     A: The patient actively participated in the discussion of tips for establishing and maintaining healthy routines. They demonstrated an understanding of the importance of starting small, being consistent, and gradually increasing the complexity of their routines. The patient expressed a willingness to remain flexible and adaptable, acknowledging that adjustments might be necessary as circumstances and priorities change over time.    P: Continue to attend PHP OT group sessions 5x week for 4 weeks to promote daily structure, social engagement, and opportunities to develop and utilize adaptive strategies to maximize functional performance in preparation for safe transition and integration back into school, work, and the community. Plan to address topic of tbd in next OT group session.                    OT Education - 12/27/22 0759     Education Details Routines              OT Short Term  Goals - 12/17/22 0932       OT SHORT TERM GOAL #1   Title Patient will be educated on strategies to improve psychosocial skills needed to participate fully in all daily, work, and leisure activities.    Time 4    Period Weeks    Status On-going    Target Date 01/16/23      OT SHORT TERM GOAL #2   Title Pt will apply psychosocial skills and coping mechanisms to daily activities in order to function independently and reintegrate into community dwelling    Status On-going      OT SHORT TERM GOAL #3   Title Pt will choose and/or engage in 1-3 socially engaging leisure activities to  improve social participation skills upon reintegrating into community    Status On-going                      Plan - 12/27/22 0800     Psychosocial Skills Interpersonal Interaction;Routines and Behaviors;Coping Strategies;Habits             Patient will benefit from skilled therapeutic intervention in order to improve the following deficits and impairments:       Psychosocial Skills: Interpersonal Interaction, Routines and Behaviors, Coping Strategies, Habits   Visit Diagnosis: Difficulty coping    Problem List Patient Active Problem List   Diagnosis Date Noted   Prolonged grief disorder 12/09/2022   Neck pain 01/06/2017   Upper back pain 01/06/2017   Cervical radiculopathy 01/06/2017   Hyperlipidemia 01/29/2016   Severe episode of recurrent major depressive disorder, without psychotic features (HCC)    Insomnia    MDD (major depressive disorder) 10/10/2015   Bipolar I disorder, most recent episode depressed (HCC)    Feeling suicidal 10/09/2015   Rhabdomyolysis 08/08/2011   Antihistamines overdose 08/08/2011   SSRI overdose 08/07/2011   Depression 08/07/2011   Anxiety disorder 08/07/2011   BMI 35.0-35.9,adult 08/07/2011   Hypothyroidism 08/07/2011    Ted Mcalpine, OT 12/27/2022, 8:01 AM  Kerrin Champagne, OT   [facility] HOSPITALIZATION PROGRAM [address] [place], [place], [address] Phone: [phone]   Fax:  [phone]  Name: Stacey Weaver MRN: [id number] Date of Birth: 06/01/1975

## 2022-12-27 NOTE — Psych (Signed)
Virtual Visit via Video Note  I connected with Stacey Weaver on 12/24/22 at  9:00 AM EDT by a video enabled telemedicine application and verified that I am speaking with the correct person using two identifiers.  Location: Patient: patient home Provider: clinical home office   I discussed the limitations of evaluation and management by telemedicine and the availability of in person appointments. The patient expressed understanding and agreed to proceed.  I discussed the assessment and treatment plan with the patient. The patient was provided an opportunity to ask questions and all were answered. The patient agreed with the plan and demonstrated an understanding of the instructions.   The patient was advised to call back or seek an in-person evaluation if the symptoms worsen or if the condition fails to improve as anticipated.  Pt was provided 240 minutes of non-face-to-face time during this encounter.   Stacey Guiles, LCSW   [facility] PHP THERAPIST PROGRESS NOTE  Stacey Weaver [id number]  Session Time: 9:00 - 10:00  Participation Level: Active  Behavioral Response: CasualAlertAnxious and Depressed  Type of Therapy: Group Therapy  Treatment Goals addressed: Coping  Progress Towards Goals: Progressing  Interventions: CBT, DBT, Supportive, and Reframing  Summary: Stacey Weaver is a 47 y.o. female who presents with mood symptoms and grief.  Clinician led check-in regarding current stressors and situation, and review of patient completed daily inventory. Clinician utilized active listening and empathetic response and validated patient emotions. Clinician facilitated processing group on pertinent issues.?    Therapist Response:  Patient arrived within time allowed. Patient rates her mood at a 5 on a scale of 1-10 with 10 being best. Pt states she feels "over it." Pt states she slept 2 hours and ate 1x. Pt reports continued aggravation with not having effective  medication for sleep. Pt states she is a step away from making her own cocktail of medication to put her to sleep. Pt expresses she is trying hard by coming to group despite it not being her comfort zone or sure it will help and it upsets her more that she is trying hard but feels ignored. Pt demonstrates increased vulnerability, openness, and acknowledgement/sharing of feelings during processing and accepts feedback/support from group.  Cln shared information re: medication to [facility] provider T. Lewis who will reach out. See note.        Session Time: 10:00 am - 11:00 am   Participation Level: Active   Behavioral Response: CasualAlertDepressed   Type of Therapy: Group Therapy   Treatment Goals addressed: Coping   Progress Towards Goals: Progressing   Interventions: CBT, DBT, Solution Focused, Strength-based, Supportive, and Reframing   Therapist Response: Cln led discussion on negative self-talk and how it affects Stacey Weaver. Cln utilized CBT to discuss how thoughts shape our feelings and actions. Group members shared how negative thinking affects them and worked to reframe their negative thinking.    Therapist Response: Pt engaged in discussion and reports understanding.          Session Time: 11:00 -12:00   Participation Level: Active   Behavioral Response: CasualAlertDepressed   Type of Therapy: Group Therapy   Treatment Goals addressed: Coping   Progress Towards Goals: Progressing   Interventions: CBT, DBT, Solution Focused, Strength-based, Supportive, and Reframing   Summary: Cln led discussion on "spiraling" or when our thoughts compound on one another to descend our feelings into worse and worse situations. Group members discussed the ways in which spiraling is difficult for them and when they  are especially vulnerable. Cln offered DBT distraction skills as a way to halt the spiral once you recognize it.    Therapist Response:  Pt engaged in discussion and reports spiraling is  a major issue for them. Pt is able to increase awareness of spiraling throughout the discussion.            Session Time: 12:00 -1:00   Participation Level: Active   Behavioral Response: CasualAlertDepressed   Type of Therapy: Group therapy   Treatment Goals addressed: Coping   Progress Towards Goals: Progressing   Interventions: CBT, DBT, Solution Focused, Strength-based, Supportive, and Reframing   Summary: 12:00 - 12:50: Cln led discussion on ways to manage stressors and feelings over the weekend. Group members  brainstormed things to do over the weekend for multiple levels of energy, access, and moods. Cln reviewed crisis services should they be needed and provided pt's with the text crisis line, mobile crisis, national suicide hotline, [facility] 24/7 line, and information on [facility].    12:50 - 1:00 Clinician assessed for immediate needs, medication compliance and efficacy, and safety concerns.   Therapist Response: 12:00 - 12:50:  Pt engaged in discussion and is able to identify 3 ideas of what to do over the weekend to keep their mind engaged. 12:50 - 1:00 pm: At check-out, patient reports no immediate concerns. Patient demonstrates progress as evidenced by continued engagement and responsiveness to treatment. Patient denies SI/HI/self-harm thoughts at the end of group.     Suicidal/Homicidal: Nowithout intent/plan  Plan: Pt will continue in PHP while working to decrease depression/anxiety/grief symptoms, increase emotion regulation, and increase ability to manage symptoms in a healthy manner.   Collaboration of Care: Medication Management AEB T Lewis  Patient/Guardian was advised Release of Information must be obtained prior to any record release in order to collaborate their care with an outside provider. Patient/Guardian was advised if they have not already done so to contact the registration department to sign all necessary forms in order for Stacey Weaver to release information  regarding their care.   Consent: Patient/Guardian gives verbal consent for treatment and assignment of benefits for services provided during this visit. Patient/Guardian expressed understanding and agreed to proceed.   Diagnosis: Bipolar I disorder, most recent episode depressed (HCC) [F31.30]    1. Bipolar I disorder, most recent episode depressed (HCC)   2. Other insomnia   3. Prolonged grief disorder       Stacey Guiles, LCSW

## 2022-12-28 ENCOUNTER — Other Ambulatory Visit (HOSPITAL_COMMUNITY): Payer: PRIVATE HEALTH INSURANCE

## 2022-12-28 ENCOUNTER — Other Ambulatory Visit (HOSPITAL_COMMUNITY): Payer: PRIVATE HEALTH INSURANCE | Attending: Psychiatry | Admitting: Licensed Clinical Social Worker

## 2022-12-28 DIAGNOSIS — R4589 Other symptoms and signs involving emotional state: Secondary | ICD-10-CM | POA: Insufficient documentation

## 2022-12-28 DIAGNOSIS — G4709 Other insomnia: Secondary | ICD-10-CM | POA: Insufficient documentation

## 2022-12-28 DIAGNOSIS — F4381 Prolonged grief disorder: Secondary | ICD-10-CM | POA: Diagnosis not present

## 2022-12-28 DIAGNOSIS — F3130 Bipolar disorder, current episode depressed, mild or moderate severity, unspecified: Secondary | ICD-10-CM | POA: Insufficient documentation

## 2022-12-28 DIAGNOSIS — F515 Nightmare disorder: Secondary | ICD-10-CM | POA: Insufficient documentation

## 2022-12-28 DIAGNOSIS — R451 Restlessness and agitation: Secondary | ICD-10-CM | POA: Insufficient documentation

## 2022-12-29 ENCOUNTER — Other Ambulatory Visit (HOSPITAL_COMMUNITY): Payer: PRIVATE HEALTH INSURANCE

## 2022-12-29 ENCOUNTER — Encounter (HOSPITAL_COMMUNITY): Payer: Self-pay

## 2022-12-29 ENCOUNTER — Other Ambulatory Visit (HOSPITAL_COMMUNITY): Payer: PRIVATE HEALTH INSURANCE | Admitting: Licensed Clinical Social Worker

## 2022-12-29 DIAGNOSIS — F4381 Prolonged grief disorder: Secondary | ICD-10-CM

## 2022-12-29 DIAGNOSIS — R4589 Other symptoms and signs involving emotional state: Secondary | ICD-10-CM

## 2022-12-29 DIAGNOSIS — G4709 Other insomnia: Secondary | ICD-10-CM

## 2022-12-29 DIAGNOSIS — F3130 Bipolar disorder, current episode depressed, mild or moderate severity, unspecified: Secondary | ICD-10-CM

## 2022-12-29 MED ORDER — PRAZOSIN HCL 1 MG PO CAPS: 1.0000 mg | ORAL_CAPSULE | Freq: Every day | ORAL | 0 refills | Status: DC

## 2022-12-29 NOTE — Progress Notes (Signed)
Virtual Visit via Video Note  I connected with Stacey Weaver on 12/29/22 at  9:00 AM EDT by a video enabled telemedicine application and verified that I am speaking with the correct person using two identifiers.  Location: Patient: Home Provider: Office   I discussed the limitations of evaluation and management by telemedicine and the availability of in person appointments. The patient expressed understanding and agreed to proceed.    I discussed the assessment and treatment plan with the patient. The patient was provided an opportunity to ask questions and all were answered. The patient agreed with the plan and demonstrated an understanding of the instructions.   The patient was advised to call back or seek an in-person evaluation if the symptoms worsen or if the condition fails to improve as anticipated.  I provided 45 minutes of non-face-to-face time during this encounter.   Lamar Sprinkles, MD   [facility] MD/PA/NP OP Progress Note  12/29/2022 10:05 AM Stacey Weaver  MRN:  [id number]  Chief Complaint: Sleeping concerns  HPI: Stacey Weaver 47 year old African-American female was seen and evaluated via video telehealth assessment.  She was seen and evaluated by this provider and attending psychiatrist Lucianne Muss.  Patient continues to endorse symptoms related to insomnia.  States more recently she has been having "night terrors."  States her symptoms started after the passing of her mother.  States her nightmares/night terrors have become more intrusive and not allowing her to sleep well at night.  "  Afraid to fall asleep."  states she had concerns with taking Ambien due to worsening of night terrors.  Patient continues to report taken multiple sleep medications in the past without relief.  Patient reports taking Ambien at 8 PM, fell asleep at 1 AM, and back up at 3 AM. Night terrors are waking her. She is exhausted and has trialed a number of medications in the past. Sleep hygiene:  hygienic practices - Has blackout curtains, lavender humidifier. Lies down in bed. Cut down bright light on phone. Tries to go to sleep, then right back up. We discussed sleep hygiene at length, including reframing the bed for sleep and sex; if she is unable to sleep, she is to get out of bed, drink warm tea/lemon water and read until sleepy. She is not to be on her cell phone when preparing for bed. She is amenable to this and willing to try Prazosin. Night terrors have have become worse in the past 6 months. Worsened with lack of sleep. Last time sleeping 8 hours through the night was within the last year.   Previous regimen worked well, but now not working with the stress of multiple familial losses. Denies experiencing vivid dreams with Trazodone. With increase in Ambien to 12.5 mg, worked on first night, then was ineffective afterward. She has not noted improvements in staying asleep. Last night, she felt as though she were getting sleepy. She fell asleep at 1, but was back up at 3 AM with night terrors; this occurs almost every night.   Appetite poor, non-existent. She does drink protein shakes. She tries to drink 2 daily. Muscle milk (230 calories) and Premier (160-190 calories). Challenge to get in solid foods once daily.   Denies active SI, but has passive SI. Protective factor: brother- made promise. Has a great deal of support, but they do not understand her perspective. She and her mom were inseparable. She will never experience love like her mother.    Patient discussed with attending psychiatrist Lucianne Muss increasing  Ambien 6.25 mg to 12.5 milligrams nightly coupled with trazodone 300 mg nightly.  Patient to start taking medications about 8 PM.  She was receptive to plan.  Discussed consideration for initiating Prazosin 1-2 mg nigltly at follow-up appointment.  Support,encouragement and  reassurance was provided.  During evaluation Kaprice Shante Fiorello is sitting; she is alert/oriented x 4;  calm/cooperative; and mood congruent with affect.  Patient is speaking in a clear tone at moderate volume, and normal pace; with good eye contact. Her thought process is coherent and relevant; There is no indication that she is currently responding to internal/external stimuli or experiencing delusional thought content.  Patient denies suicidal/self-harm/homicidal ideation, psychosis, and paranoia.  Patient has remained calm throughout assessment and has answered questions appropriately.    Visit Diagnosis:    ICD-10-CM   1. Bipolar I disorder, most recent episode depressed (HCC)  F31.30     2. Prolonged grief disorder  F43.81     3. Difficulty coping  R45.89     4. Other insomnia  G47.09        Past Psychiatric History:   Past Medical History:  Past Medical History:  Diagnosis Date   Anxiety    Asthma    Bipolar 1 disorder (HCC)    Migraine     Past Surgical History:  Procedure Laterality Date   KNEE SURGERY      Family Psychiatric History:   Family History:  Family History  Problem Relation Age of Onset   Hyperlipidemia Mother    Hypertension Mother    Heart disease Father 4       CAD   Hyperlipidemia Maternal Grandmother    Cancer Maternal Grandmother    Cancer Maternal Grandfather    Hyperlipidemia Brother     Social History:  Social History   Socioeconomic History   Marital status: Single    Spouse name: Not on file   Number of children: Not on file   Years of education: Not on file   Highest education level: Not on file  Occupational History   Not on file  Tobacco Use   Smoking status: Never   Smokeless tobacco: Never  Substance and Sexual Activity   Alcohol use: No   Drug use: No   Sexual activity: Yes    Birth control/protection: I.U.D.  Other Topics Concern   Not on file  Social History Narrative   Not on file   Social Determinants of Health   Financial Resource Strain: Not on file  Food Insecurity: Medium Risk (11/04/2022)   Received  from [facility]   Hunger Vital Sign    Worried About Running Out of Food in the Last Year: Sometimes true    Ran Out of Food in the Last Year: Sometimes true  Transportation Needs: Not on file (11/04/2022)  Physical Activity: Not on file  Stress: Not on file  Social Connections: Unknown (08/04/2021)   Received from [facility]   Social Network    Social Network: Not on file    Allergies:  Allergies  Allergen Reactions   Almotriptan Malate Shortness Of Breath and Other (See Comments)    Muscle spasms   Cymbalta [Duloxetine Hcl] Other (See Comments)    Other reaction(s): Other Muscle spasms suicidal thoughts   Imitrex [Sumatriptan] Shortness Of Breath, Other (See Comments) and Palpitations    Muscle spasms   Prednisone Other (See Comments) and Palpitations   Zomig Shortness Of Breath   Geodon [Ziprasidone Hcl] Other (See Comments)  hallucinations   Lactose Intolerance (Gi) Diarrhea and Other (See Comments)    Gas and bloated    Penicillins Other (See Comments)    GI upset  Has patient had a PCN reaction causing immediate rash, facial/tongue/throat swelling, SOB or lightheadedness with hypotension: No Has patient had a PCN reaction causing severe rash involving mucus membranes or skin necrosis: No Has patient had a PCN reaction that required hospitalization No Has patient had a PCN reaction occurring within the last 10 years: Yes If all of the above answers are "NO", then may proceed with Cephalosporin use.    Zyprexa [Olanzapine] Other (See Comments)    Hallucinations     Metabolic Disorder Labs: No results found for: "HGBA1C", "MPG" No results found for: "PROLACTIN" Lab Results  Component Value Date   CHOL 250 (H) 01/29/2016   TRIG 87.0 01/29/2016   HDL 65.40 01/29/2016   CHOLHDL 4 01/29/2016   VLDL 17.4 01/29/2016   LDLCALC 167 (H) 01/29/2016   Lab Results  Component Value Date   TSH 1.36 06/30/2015   TSH 4.132 08/24/2011    Therapeutic  Level Labs: No results found for: "LITHIUM" Lab Results  Component Value Date   VALPROATE 32.4 (L) 10/14/2009   VALPROATE 68.2 10/19/2008   Lab Results  Component Value Date   CBMZ 8.7 10/14/2015    Current Medications: Current Outpatient Medications  Medication Sig Dispense Refill   prazosin (MINIPRESS) 1 MG capsule Take 1 capsule (1 mg total) by mouth at bedtime. 30 capsule 0   AIMOVIG 140 MG/ML SOAJ Inject into the skin.     ALPRAZolam (XANAX) 0.25 MG tablet Take 0.25 mg by mouth at bedtime as needed for anxiety.      atorvastatin (LIPITOR) 10 MG tablet Take 10 mg by mouth daily.     carbamazepine (TEGRETOL XR) 200 MG 12 hr tablet Take 200 mg by mouth 2 (two) times daily.     carbamazepine (TEGRETOL) 200 MG tablet Take 1 tablet (200 mg total) by mouth 2 (two) times daily. 60 tablet 0   clonazePAM (KLONOPIN) 0.5 MG tablet Take by mouth.     dicyclomine (BENTYL) 20 MG tablet Take 1 tablet (20 mg total) by mouth 2 (two) times daily. 20 tablet 0   famotidine (PEPCID) 20 MG tablet Take 1 tablet (20 mg total) by mouth 2 (two) times daily. 30 tablet 0   HYDROcodone-acetaminophen (NORCO/VICODIN) 5-325 MG tablet Take 1 tablet by mouth every 4 (four) hours as needed. 10 tablet 0   ibuprofen (ADVIL) 800 MG tablet Take 1 tablet (800 mg total) by mouth 3 (three) times daily. 21 tablet 0   lamoTRIgine (LAMICTAL) 25 MG tablet Take 1 tablet (25 mg total) by mouth 2 (two) times daily. 60 tablet 0   methocarbamol (ROBAXIN) 500 MG tablet Take 1 tablet (500 mg total) by mouth 2 (two) times daily as needed for up to 15 doses for muscle spasms. 15 tablet 0   metoCLOPramide (REGLAN) 10 MG tablet Take 1 tablet (10 mg total) by mouth every 6 (six) hours. 30 tablet 0   metroNIDAZOLE (FLAGYL) 500 MG tablet Take 500 mg by mouth 2 (two) times daily.     ondansetron (ZOFRAN) 4 MG tablet Take 1 tablet (4 mg total) by mouth every 4 (four) hours as needed for nausea or vomiting. 6 tablet 0   phentermine 15 MG  capsule Take 15 mg by mouth daily.     potassium chloride (KLOR-CON) 10 MEQ tablet Take 1 tablet (10  mEq total) by mouth daily for 30 doses. 30 tablet 0   prochlorperazine (COMPAZINE) 10 MG tablet Take 1 tablet (10 mg total) by mouth 2 (two) times daily as needed for nausea or vomiting. 10 tablet 0   QUEtiapine (SEROQUEL) 200 MG tablet Take 200 mg by mouth 2 (two) times daily.     sertraline (ZOLOFT) 50 MG tablet Take 50 mg by mouth 2 (two) times daily.     traZODone (DESYREL) 100 MG tablet Take 3 tablets by mouth at bedtime.     UBRELVY 100 MG TABS Take by mouth.     WEGOVY 0.5 MG/0.5ML SOAJ Inject 0.5 mg into the skin once a week.     zolpidem (AMBIEN CR) 12.5 MG CR tablet Take 1 tablet (12.5 mg total) by mouth at bedtime as needed for sleep. 10 tablet 0   No current facility-administered medications for this visit.     Musculoskeletal:  Psychiatric Specialty Exam: Review of Systems  Psychiatric/Behavioral:  Positive for agitation and sleep disturbance.   All other systems reviewed and are negative.   There were no vitals taken for this visit.There is no height or weight on file to calculate BMI.  General Appearance: Casual  Eye Contact:  Good  Speech:  Clear and Coherent  Volume:  Normal  Mood:  Anxious and Depressed  Affect:  Congruent  Thought Process:  Coherent  Orientation:  Full (Time, Place, and Person)  Thought Content: Logical   Suicidal Thoughts:  No  Homicidal Thoughts:  No  Memory:  Immediate;   Good Recent;   Good  Judgement:  Good  Insight:  Good  Psychomotor Activity:  Normal  Concentration:  Concentration: Good  Recall:  Good  Fund of Knowledge: Good  Language: Good  Akathisia:  No  Handed:  Right  AIMS (if indicated): not done  Assets:  Communication Skills Desire for Improvement Social Support  ADL's:  Intact  Cognition: WNL  Sleep:  Poor   Screenings: AIMS    Flowsheet Row [facility]  AIMS Total Score 0      AUDIT    Flowsheet Row [facility]  Alcohol Use Disorder Identification Test Final Score (AUDIT) 0      PHQ2-9    Flowsheet Row Counselor from 12/09/2022 in [facility] PARTIAL HOSPITALIZATION PROGRAM Office Visit from 02/12/2016 in Primary Care at [facility] Visit from 06/30/2015 in Primary Care at [facility] Visit from 12/02/2014 in Primary Care at [facility] Visit from 09/18/2014 in Primary Care at [facility] Total Score 6 0 2 0 0  PHQ-9 Total Score 20 -- 19 -- --      Flowsheet Row Counselor from 12/09/2022 in [facility] PARTIAL HOSPITALIZATION PROGRAM ED from 12/06/2022 in [facility] ED from 08/11/2022 in [facility] Department at [facility]  C-SSRS RISK CATEGORY Low Risk No Risk No Risk        Assessment and Plan:  Continue partial hospitalization programming (PHP) Continue Current medications: Zoloft 150 mg  Lamictal 100 mg once per day Ambien 12.5 mg Trazodone 300 mg Phentermine 15 mg- Takes at 4 AM. About 6 months - advised to consider this in the future as contributory to insomnia; started around same time sleep began to worsen Klonipin 0.5 mg as needed; Taking 2-3 nights weekly.  Carbemazepine 200 mg BID   START Prazosin 1 mg  at bedtime for nightmares  Collaboration of Care: Collaboration of Care: Medication Management AEB see above  Patient/Guardian was advised Release of Information must be obtained prior to any record release in order to collaborate their care with an outside provider. Patient/Guardian was advised if they have not already done so to contact the registration department to sign all necessary forms in order for [place] to release information regarding their care.   Consent: Patient/Guardian gives verbal consent for treatment and assignment of benefits for services  provided during this visit. Patient/Guardian expressed understanding and agreed to proceed.    Lamar Sprinkles, MD 12/29/2022 10:05 AM

## 2022-12-29 NOTE — Therapy (Signed)
[facility] PARTIAL HOSPITALIZATION PROGRAM [address] [place], [place], [address] Phone: [phone]   Fax:  [phone]  Occupational Therapy Treatment Virtual Visit via Video Note  I connected with Stacey Weaver on 12/29/22 at  8:00 AM EDT by a video enabled telemedicine application and verified that I am speaking with the correct person using two identifiers.  Location: Patient: home Provider: office   I discussed the limitations of evaluation and management by telemedicine and the availability of in person appointments. The patient expressed understanding and agreed to proceed.    The patient was advised to call back or seek an in-person evaluation if the symptoms worsen or if the condition fails to improve as anticipated.  I provided 55 minutes of non-face-to-face time during this encounter.   Patient Details  Name: Stacey Weaver MRN: [id number] Date of Birth: 1975-12-22 No data recorded  Encounter Date: 12/27/2022   OT End of Session - 12/29/22 0930     Visit Number 6    Number of Visits 20    Date for OT Re-Evaluation 01/16/23    OT Start Time 1200    OT Stop Time 1255    OT Time Calculation (min) 55 min             Past Medical History:  Diagnosis Date   Anxiety    Asthma    Bipolar 1 disorder (HCC)    Migraine     Past Surgical History:  Procedure Laterality Date   KNEE SURGERY      There were no vitals filed for this visit.   Subjective Assessment - 12/29/22 0930     Currently in Pain? No/denies    Pain Score 0-No pain                  Group Session:  S: Doing better today. Still having trouble w/ sleep.   O: The objective of this presentation is to provide a comprehensive understanding of the concept of "motivation" and its role in human behavior and well-being. The content covers various theories of motivation, including intrinsic and extrinsic motivators, and explores the psychological  mechanisms that drive individuals to achieve goals, overcome obstacles, and make decisions. By diving into real-world applications, the presentation aims to offer actionable strategies for enhancing motivation in different life domains, such as work, relationships, and personal growth. Utilizing a multi-disciplinary approach, this presentation integrates insights from psychology, neuroscience, and behavioral economics to present a holistic view of motivation. The objective is not only to educate the audience about the complexities and driving forces behind motivation but also to equip them with practical tools and techniques to improve their own motivation levels. By the end of the presentation, attendees should have a well-rounded understanding of what motivates human actions and how to harness this knowledge for personal and professional betterment.   A: The patient demonstrates a high level of engagement during the session, actively participating in discussions about motivation theories and their applicability to their own life. They show keen interest in learning new strategies to improve their motivation and even offer examples from their own experiences that align with the theories presented. Their level of self-awareness and willingness to invest in self-improvement suggest that they are well-positioned to benefit from the practical tools and techniques discussed. The patient's ability to articulate their goals and challenges further supports the likelihood of successfully implementing the strategies presented.    P: Continue to attend PHP OT group sessions  5x week for 4 weeks to promote daily structure, social engagement, and opportunities to develop and utilize adaptive strategies to maximize functional performance in preparation for safe transition and integration back into school, work, and the community. Plan to address topic of pt 2 in next OT group session.                 OT  Education - 12/29/22 0930     Education Details Motivation              OT Short Term Goals - 12/17/22 0932       OT SHORT TERM GOAL #1   Title Patient will be educated on strategies to improve psychosocial skills needed to participate fully in all daily, work, and leisure activities.    Time 4    Period Weeks    Status On-going    Target Date 01/16/23      OT SHORT TERM GOAL #2   Title Pt will apply psychosocial skills and coping mechanisms to daily activities in order to function independently and reintegrate into community dwelling    Status On-going      OT SHORT TERM GOAL #3   Title Pt will choose and/or engage in 1-3 socially engaging leisure activities to improve social participation skills upon reintegrating into community    Status On-going                      Plan - 12/29/22 0930     Psychosocial Skills Interpersonal Interaction;Routines and Behaviors;Coping Strategies;Habits             Patient will benefit from skilled therapeutic intervention in order to improve the following deficits and impairments:       Psychosocial Skills: Interpersonal Interaction, Routines and Behaviors, Coping Strategies, Habits   Visit Diagnosis: Difficulty coping    Problem List Patient Active Problem List   Diagnosis Date Noted   Prolonged grief disorder 12/09/2022   Neck pain 01/06/2017   Upper back pain 01/06/2017   Cervical radiculopathy 01/06/2017   Hyperlipidemia 01/29/2016   Severe episode of recurrent major depressive disorder, without psychotic features (HCC)    Insomnia    MDD (major depressive disorder) 10/10/2015   Bipolar I disorder, most recent episode depressed (HCC)    Feeling suicidal 10/09/2015   Rhabdomyolysis 08/08/2011   Antihistamines overdose 08/08/2011   SSRI overdose 08/07/2011   Depression 08/07/2011   Anxiety disorder 08/07/2011   BMI 35.0-35.9,adult 08/07/2011   Hypothyroidism 08/07/2011    Ted Mcalpine,  OT 12/29/2022, 9:30 AM Kerrin Champagne, OT   [facility] HOSPITALIZATION PROGRAM [address] [place], [place], [address] Phone: [phone]   Fax:  [phone]  Name: Stacey Weaver MRN: [id number] Date of Birth: Dec 18, 1975

## 2022-12-30 ENCOUNTER — Other Ambulatory Visit (HOSPITAL_COMMUNITY): Payer: PRIVATE HEALTH INSURANCE

## 2022-12-30 ENCOUNTER — Other Ambulatory Visit (HOSPITAL_COMMUNITY): Payer: PRIVATE HEALTH INSURANCE | Admitting: Licensed Clinical Social Worker

## 2022-12-30 DIAGNOSIS — F3130 Bipolar disorder, current episode depressed, mild or moderate severity, unspecified: Secondary | ICD-10-CM | POA: Diagnosis not present

## 2022-12-30 DIAGNOSIS — R4589 Other symptoms and signs involving emotional state: Secondary | ICD-10-CM

## 2022-12-30 DIAGNOSIS — F4381 Prolonged grief disorder: Secondary | ICD-10-CM

## 2022-12-31 ENCOUNTER — Other Ambulatory Visit (HOSPITAL_COMMUNITY): Payer: PRIVATE HEALTH INSURANCE

## 2023-01-02 ENCOUNTER — Encounter (HOSPITAL_COMMUNITY): Payer: Self-pay

## 2023-01-02 NOTE — Therapy (Signed)
[facility] PARTIAL HOSPITALIZATION PROGRAM [address] [place], [place], [address] Phone: [phone]   Fax:  [phone]  Occupational Therapy Treatment Virtual Visit via Video Note  I connected with [name] on 01/02/23 at  8:00 AM EDT by a video enabled telemedicine application and verified that I am speaking with the correct person using two identifiers.  Location: Patient: home Provider: office   I discussed the limitations of evaluation and management by telemedicine and the availability of in person appointments. The patient expressed understanding and agreed to proceed.    The patient was advised to call back or seek an in-person evaluation if the symptoms worsen or if the condition fails to improve as anticipated.  I provided 55 minutes of non-face-to-face time during this encounter.   Patient Details  Name: Stacey Weaver MRN: [id number] Date of Birth: [birth year] No data recorded  Encounter Date: 12/29/2022   OT End of Session - 01/02/23 2131     Visit Number 8    Number of Visits 20    Date for OT Re-Evaluation 01/16/23    OT Start Time 1200    OT Stop Time 1255    OT Time Calculation (min) 55 min             Past Medical History:  Diagnosis Date   Anxiety    Asthma    Bipolar 1 disorder (HCC)    Migraine     Past Surgical History:  Procedure Laterality Date   KNEE SURGERY      There were no vitals filed for this visit.   Subjective Assessment - 01/02/23 2131     Currently in Pain? No/denies    Pain Score 0-No pain               Group Session:  S: feeling better today.   O: In this communication group therapy session, facilitated by an occupational therapist, participants explored several key subtopics aimed at enhancing their interpersonal skills. The session began with a discussion on the use of "I" and "AND" statements, emphasizing personal responsibility and constructive language in  expressing feelings and needs. Participants then practiced active listening techniques to improve their ability to fully understand and respond to others. The group also delved into assertive communication, learning how to express themselves confidently and respectfully. Emotional regulation skills were addressed, providing strategies for managing emotions during interactions. Social skills training included role-playing scenarios to build confidence in various social contexts. Feedback and reflection were integral parts of the session, with participants offering and receiving constructive feedback to foster personal growth. The group identified common barriers to effective communication, such as anxiety, fear of judgment, and past negative experiences, and discussed strategies to overcome these challenges, including mindfulness practices and building a supportive network.   A: The patient actively participated in the group therapy session, demonstrating a high level of engagement and enthusiasm. They contributed to discussions on "I" and "AND" statements by sharing personal examples and insights. During the active listening exercises, the patient was attentive and provided thoughtful feedback to peers. They effectively practiced assertive communication techniques and showed a clear understanding of emotional regulation strategies. In social skills role-playing, the patient was confident and responsive, indicating a good grasp of the concepts. The patient also engaged in the feedback and reflection segment, offering constructive comments and showing receptivity to feedback from others. Their proactive approach and willingness to explore personal barriers to communication suggest significant progress and motivation to  improve interpersonal skills.    P: Continue to attend PHP OT group sessions 5x week for 4 weeks to promote daily structure, social engagement, and opportunities to develop and utilize adaptive  strategies to maximize functional performance in preparation for safe transition and integration back into school, work, and the community. Plan to address topic of tbd in next OT group session.                    OT Education - 01/02/23 2131     Education Details Communication              OT Short Term Goals - 12/17/22 0932       OT SHORT TERM GOAL #1   Title Patient will be educated on strategies to improve psychosocial skills needed to participate fully in all daily, work, and leisure activities.    Time 4    Period Weeks    Status On-going    Target Date 01/16/23      OT SHORT TERM GOAL #2   Title Pt will apply psychosocial skills and coping mechanisms to daily activities in order to function independently and reintegrate into community dwelling    Status On-going      OT SHORT TERM GOAL #3   Title Pt will choose and/or engage in 1-3 socially engaging leisure activities to improve social participation skills upon reintegrating into community    Status On-going                      Plan - 01/02/23 2131     Psychosocial Skills Interpersonal Interaction;Routines and Behaviors;Coping Strategies;Habits             Patient will benefit from skilled therapeutic intervention in order to improve the following deficits and impairments:       Psychosocial Skills: Interpersonal Interaction, Routines and Behaviors, Coping Strategies, Habits   Visit Diagnosis: Difficulty coping    Problem List Patient Active Problem List   Diagnosis Date Noted   Prolonged grief disorder 12/09/2022   Neck pain 01/06/2017   Upper back pain 01/06/2017   Cervical radiculopathy 01/06/2017   Hyperlipidemia 01/29/2016   Severe episode of recurrent major depressive disorder, without psychotic features (HCC)    Insomnia    MDD (major depressive disorder) 10/10/2015   Bipolar I disorder, most recent episode depressed (HCC)    Feeling suicidal 10/09/2015    Rhabdomyolysis 08/08/2011   Antihistamines overdose 08/08/2011   SSRI overdose 08/07/2011   Depression 08/07/2011   Anxiety disorder 08/07/2011   BMI 35.0-35.9,adult 08/07/2011   Hypothyroidism 08/07/2011    Ted Mcalpine, OT 01/02/2023, 9:31 PM  Kerrin Champagne, OT   [facility] HOSPITALIZATION PROGRAM [address] [place], [place], [address] Phone: [phone]   Fax:  [phone]  Name: Zyrah Wiswell MRN: [id number] Date of Birth: 25-Feb-1976

## 2023-01-02 NOTE — Therapy (Signed)
[facility] PARTIAL HOSPITALIZATION PROGRAM [address] [place], [place], [address] Phone: [phone]   Fax:  [phone]  Occupational Therapy Treatment Virtual Visit via Video Note  I connected with Stacey Weaver on 01/02/23 at  8:00 AM EDT by a video enabled telemedicine application and verified that I am speaking with the correct person using two identifiers.  Location: Patient: home Provider: office   I discussed the limitations of evaluation and management by telemedicine and the availability of in person appointments. The patient expressed understanding and agreed to proceed.    The patient was advised to call back or seek an in-person evaluation if the symptoms worsen or if the condition fails to improve as anticipated.  I provided 55 minutes of non-face-to-face time during this encounter.   Patient Details  Name: Stacey Weaver MRN: [id number] Date of Birth: [birth year] No data recorded  Encounter Date: 12/28/2022   OT End of Session - 01/02/23 2122     Visit Number 7    Number of Visits 20    Date for OT Re-Evaluation 01/16/23    OT Start Time 1200    OT Stop Time 1255    OT Time Calculation (min) 55 min             Past Medical History:  Diagnosis Date   Anxiety    Asthma    Bipolar 1 disorder (HCC)    Migraine     Past Surgical History:  Procedure Laterality Date   KNEE SURGERY      There were no vitals filed for this visit.   Subjective Assessment - 01/02/23 2122     Currently in Pain? No/denies    Pain Score 0-No pain                Group Session:  S: Doing a bit better today.   O: The objective of the telehealth group therapy session was to discuss the significance of routines in promoting mental health and wellbeing. The OT aimed to explore the power of routines in providing structure, stability, and predictability in individuals' lives, as well as their role in establishing healthy  habits, reducing stress and anxiety, managing time effectively, achieving goals, and fostering a sense of community and social connectedness.   Participants were guided to identify areas where routines could improve their mental health and were provided with tips for establishing and maintaining healthy routines. The session concluded by emphasizing the potential of routines to enhance overall quality of life.     A: During the telehealth group therapy session, the patient actively participated in the discussion on the importance of routines in promoting mental health and wellbeing. The patient demonstrated a good understanding of the power of routines in providing structure, stability, and predictability in their life. They were able to identify areas in their life where routines could contribute to improving their overall mental health.  The patient showed motivation and willingness to reflect on their current habits and behaviors, recognizing the need for positive changes. They actively engaged in the session, sharing personal experiences and challenges related to establishing and maintaining healthy routines. The patient expressed a desire to reduce stress and anxiety, manage their time more effectively, and achieve their goals through the implementation of routines.    P: Continue to attend PHP OT group sessions 5x week for 4 weeks to promote daily structure, social engagement, and opportunities to develop and utilize adaptive strategies to maximize functional performance in  preparation for safe transition and integration back into school, work, and the community. Plan to address topic of tbd in next OT group session.                    OT Education - 01/02/23 2122     Education Details Routines              OT Short Term Goals - 12/17/22 0932       OT SHORT TERM GOAL #1   Title Patient will be educated on strategies to improve psychosocial skills needed to  participate fully in all daily, work, and leisure activities.    Time 4    Period Weeks    Status On-going    Target Date 01/16/23      OT SHORT TERM GOAL #2   Title Pt will apply psychosocial skills and coping mechanisms to daily activities in order to function independently and reintegrate into community dwelling    Status On-going      OT SHORT TERM GOAL #3   Title Pt will choose and/or engage in 1-3 socially engaging leisure activities to improve social participation skills upon reintegrating into community    Status On-going                      Plan - 01/02/23 2122     Psychosocial Skills Interpersonal Interaction;Routines and Behaviors;Coping Strategies;Habits             Patient will benefit from skilled therapeutic intervention in order to improve the following deficits and impairments:       Psychosocial Skills: Interpersonal Interaction, Routines and Behaviors, Coping Strategies, Habits   Visit Diagnosis: Difficulty coping    Problem List Patient Active Problem List   Diagnosis Date Noted   Prolonged grief disorder 12/09/2022   Neck pain 01/06/2017   Upper back pain 01/06/2017   Cervical radiculopathy 01/06/2017   Hyperlipidemia 01/29/2016   Severe episode of recurrent major depressive disorder, without psychotic features (HCC)    Insomnia    MDD (major depressive disorder) 10/10/2015   Bipolar I disorder, most recent episode depressed (HCC)    Feeling suicidal 10/09/2015   Rhabdomyolysis 08/08/2011   Antihistamines overdose 08/08/2011   SSRI overdose 08/07/2011   Depression 08/07/2011   Anxiety disorder 08/07/2011   BMI 35.0-35.9,adult 08/07/2011   Hypothyroidism 08/07/2011    Stacey Weaver, OT 01/02/2023, 9:23 PM  Stacey Weaver, OT   [facility] HOSPITALIZATION PROGRAM [address] [place], [place], [address] Phone: [phone]   Fax:  [phone]  Name: Stacey Weaver MRN:  [id number] Date of Birth: 1976-01-03

## 2023-01-02 NOTE — Therapy (Signed)
[facility] PARTIAL HOSPITALIZATION PROGRAM [address] [place], [place], [address] Phone: [phone]   Fax:  [phone]  Occupational Therapy Treatment Virtual Visit via Video Note  I connected with Stacey Weaver on 01/02/23 at  8:00 AM EDT by a video enabled telemedicine application and verified that I am speaking with the correct person using two identifiers.  Location: Patient: home Provider: office   I discussed the limitations of evaluation and management by telemedicine and the availability of in person appointments. The patient expressed understanding and agreed to proceed.    The patient was advised to call back or seek an in-person evaluation if the symptoms worsen or if the condition fails to improve as anticipated.  I provided 55 minutes of non-face-to-face time during this encounter.   Patient Details  Name: Stacey Weaver MRN: [id number] Date of Birth: 12-10-1975 No data recorded  Encounter Date: 12/30/2022   OT End of Session - 01/02/23 2202     Visit Number 9    Number of Visits 20    Date for OT Re-Evaluation 01/16/23    OT Start Time 1200    OT Stop Time 1255    OT Time Calculation (min) 55 min             Past Medical History:  Diagnosis Date   Anxiety    Asthma    Bipolar 1 disorder (HCC)    Migraine     Past Surgical History:  Procedure Laterality Date   KNEE SURGERY      There were no vitals filed for this visit.   Subjective Assessment - 01/02/23 2202     Currently in Pain? No/denies    Pain Score 0-No pain               Group Session:  S: Feeling better today.   O: The primary objective of this topic is to explore and understand the concept of occupational balance in the context of daily living. The term "occupational balance" is defined broadly, encompassing all activities that occupy an individual's time and energy, including self-care, leisure, and work-related tasks. The goal  is to guide participants towards achieving a harmonious blend of these activities, tailored to their personal values and life circumstances. This balance is aimed at enhancing overall well-being, not by equally distributing time across activities, but by ensuring that daily engagements are fulfilling and not draining. The content delves into identifying various barriers that individuals face in achieving occupational balance, such as overcommitment, misaligned priorities, external pressures, and lack of effective time management. The impact of these barriers on occupational performance, roles, and lifestyles is examined, highlighting issues like reduced efficiency, strained relationships, and potential health problems. Strategies for cultivating occupational balance are a key focus. These strategies include practical methods like time blocking, prioritizing tasks, establishing self-care rituals, decluttering, connecting with nature, and engaging in reflective practices. These approaches are designed to be adaptable and applicable to a wide range of life scenarios, promoting a proactive and mindful approach to daily living. The overall aim is to equip participants with the knowledge and tools to create a balanced lifestyle that supports their mental, emotional, and physical health, thereby improving their functional performance in daily life.   A:  The patient demonstrated a high level of engagement and active participation throughout the session on occupational balance. The patient frequently contributed to discussions, offering insightful reflections on personal experiences related to the barriers and strategies for achieving occupational balance.  There was a clear understanding of the concept and an ability to relate it to their own life. The patient showed enthusiasm in learning and applying the strategies discussed, such as time blocking and self-care rituals, indicating a strong motivation to improve  their occupational balance. The patient's proactive approach and responsiveness to the topic suggest a high potential for implementing these strategies effectively in their daily routine.    P: Continue to attend PHP OT group sessions 5x week for 4 weeks to promote daily structure, social engagement, and opportunities to develop and utilize adaptive strategies to maximize functional performance in preparation for safe transition and integration back into school, work, and the community. Plan to address topic of pt 2 in next OT group session.                    OT Education - 01/02/23 2202     Education Details Occupational Balance              OT Short Term Goals - 12/17/22 0932       OT SHORT TERM GOAL #1   Title Patient will be educated on strategies to improve psychosocial skills needed to participate fully in all daily, work, and leisure activities.    Time 4    Period Weeks    Status On-going    Target Date 01/16/23      OT SHORT TERM GOAL #2   Title Pt will apply psychosocial skills and coping mechanisms to daily activities in order to function independently and reintegrate into community dwelling    Status On-going      OT SHORT TERM GOAL #3   Title Pt will choose and/or engage in 1-3 socially engaging leisure activities to improve social participation skills upon reintegrating into community    Status On-going                      Plan - 01/02/23 2202     Psychosocial Skills Interpersonal Interaction;Routines and Behaviors;Coping Strategies;Habits             Patient will benefit from skilled therapeutic intervention in order to improve the following deficits and impairments:       Psychosocial Skills: Interpersonal Interaction, Routines and Behaviors, Coping Strategies, Habits   Visit Diagnosis: Difficulty coping    Problem List Patient Active Problem List   Diagnosis Date Noted   Prolonged grief disorder 12/09/2022    Neck pain 01/06/2017   Upper back pain 01/06/2017   Cervical radiculopathy 01/06/2017   Hyperlipidemia 01/29/2016   Severe episode of recurrent major depressive disorder, without psychotic features (HCC)    Insomnia    MDD (major depressive disorder) 10/10/2015   Bipolar I disorder, most recent episode depressed (HCC)    Feeling suicidal 10/09/2015   Rhabdomyolysis 08/08/2011   Antihistamines overdose 08/08/2011   SSRI overdose 08/07/2011   Depression 08/07/2011   Anxiety disorder 08/07/2011   BMI 35.0-35.9,adult 08/07/2011   Hypothyroidism 08/07/2011    Ted Mcalpine, OT 01/02/2023, 10:03 PM  Kerrin Champagne, OT   [facility] HOSPITALIZATION PROGRAM [address] [place], [place], [address] Phone: [phone]   Fax:  [phone]  Name: Stacey Weaver MRN: [id number] Date of Birth: 24-Jun-1975

## 2023-01-03 ENCOUNTER — Other Ambulatory Visit (HOSPITAL_COMMUNITY): Payer: PRIVATE HEALTH INSURANCE | Admitting: Licensed Clinical Social Worker

## 2023-01-03 ENCOUNTER — Other Ambulatory Visit (HOSPITAL_COMMUNITY): Payer: PRIVATE HEALTH INSURANCE

## 2023-01-03 DIAGNOSIS — G4700 Insomnia, unspecified: Secondary | ICD-10-CM

## 2023-01-03 DIAGNOSIS — F4381 Prolonged grief disorder: Secondary | ICD-10-CM

## 2023-01-03 DIAGNOSIS — F3130 Bipolar disorder, current episode depressed, mild or moderate severity, unspecified: Secondary | ICD-10-CM

## 2023-01-03 DIAGNOSIS — R4589 Other symptoms and signs involving emotional state: Secondary | ICD-10-CM

## 2023-01-03 DIAGNOSIS — G4709 Other insomnia: Secondary | ICD-10-CM

## 2023-01-03 NOTE — Progress Notes (Signed)
Virtual Visit via Video Note  I connected with Stacey Weaver on 01/03/2023 at  9:00 AM EDT by a video enabled telemedicine application and verified that I am speaking with the correct person using two identifiers.  Location: Patient: Home Provider: Office   I discussed the limitations of evaluation and management by telemedicine and the availability of in person appointments. The patient expressed understanding and agreed to proceed.    I discussed the assessment and treatment plan with the patient. The patient was provided an opportunity to ask questions and all were answered. The patient agreed with the plan and demonstrated an understanding of the instructions.   The patient was advised to call back or seek an in-person evaluation if the symptoms worsen or if the condition fails to improve as anticipated.  I provided 25 minutes of non-face-to-face time during this encounter.   Lamar Sprinkles, MD   [facility] MD/PA/NP OP Progress Note  01/03/2023 4:02 PM  Jenavi Riko Lumsden  MRN:  [id number]  Chief Complaint: Sleeping concerns; follow-up care  HPI: Stacey Weaver 47 year old African-American female was seen and evaluated via video telehealth assessment.    Patient reports that she is in much better spirits today.  She states that she has had more nights where she is able to sleep, which has been the best sleep she has had in a long while.  Since starting prazosin, she has been getting an average of 5 hours of sleep. When she takes Ambien with Prazosin, she sleeps even better. Her nightmares have significantly improved, although not completely dissipated.  She does report that since starting the prazosin, she feels lightheaded and dizzy if she stands too quickly.  She has been rising slower, with the exception of when she has to use the restroom in the middle of the night.  She denies further adverse effects of the medication.  Additionally, she has been practicing better sleep hygiene,  getting out of bed and going into her living room if she is unable to sleep.  However, she has been turning on the television, which sometimes keeps her awake.  Her appetite has improved, and she is eating more solid foods while still drinking her protein shakes daily.  She denies SI, HI, and AVH today.  She does inquire about follow-up and ensuring that she has enough of her current medications on hand to get to follow-up appointments.  Her questions were answered, and she is appreciative.  Visit Diagnosis:    ICD-10-CM   1. Bipolar I disorder, most recent episode depressed (HCC)  F31.30     2. Difficulty coping  R45.89     3. Prolonged grief disorder  F43.81     4. Other insomnia  G47.09         Past Psychiatric History:   Past Medical History:  Past Medical History:  Diagnosis Date   Anxiety    Asthma    Bipolar 1 disorder (HCC)    Migraine     Past Surgical History:  Procedure Laterality Date   KNEE SURGERY      Family Psychiatric History:   Family History:  Family History  Problem Relation Age of Onset   Hyperlipidemia Mother    Hypertension Mother    Heart disease Father 73       CAD   Hyperlipidemia Maternal Grandmother    Cancer Maternal Grandmother    Cancer Maternal Grandfather    Hyperlipidemia Brother     Social History:  Social History  Socioeconomic History   Marital status: Single    Spouse name: Not on file   Number of children: Not on file   Years of education: Not on file   Highest education level: Not on file  Occupational History   Not on file  Tobacco Use   Smoking status: Never   Smokeless tobacco: Never  Substance and Sexual Activity   Alcohol use: No   Drug use: No   Sexual activity: Yes    Birth control/protection: I.U.D.  Other Topics Concern   Not on file  Social History Narrative   Not on file   Social Determinants of Health   Financial Resource Strain: Not on file  Food Insecurity: Medium Risk (11/04/2022)    Received from [facility]   Hunger Vital Sign    Worried About Running Out of Food in the Last Year: Sometimes true    Ran Out of Food in the Last Year: Sometimes true  Transportation Needs: Not on file (11/04/2022)  Physical Activity: Not on file  Stress: Not on file  Social Connections: Unknown (08/04/2021)   Received from [facility]   Social Network    Social Network: Not on file    Allergies:  Allergies  Allergen Reactions   Almotriptan Malate Shortness Of Breath and Other (See Comments)    Muscle spasms   Cymbalta [Duloxetine Hcl] Other (See Comments)    Other reaction(s): Other Muscle spasms suicidal thoughts   Imitrex [Sumatriptan] Shortness Of Breath, Other (See Comments) and Palpitations    Muscle spasms   Prednisone Other (See Comments) and Palpitations   Zomig Shortness Of Breath   Geodon [Ziprasidone Hcl] Other (See Comments)    hallucinations   Lactose Intolerance (Gi) Diarrhea and Other (See Comments)    Gas and bloated    Penicillins Other (See Comments)    GI upset  Has patient had a PCN reaction causing immediate rash, facial/tongue/throat swelling, SOB or lightheadedness with hypotension: No Has patient had a PCN reaction causing severe rash involving mucus membranes or skin necrosis: No Has patient had a PCN reaction that required hospitalization No Has patient had a PCN reaction occurring within the last 10 years: Yes If all of the above answers are "NO", then may proceed with Cephalosporin use.    Zyprexa [Olanzapine] Other (See Comments)    Hallucinations     Metabolic Disorder Labs: No results found for: "HGBA1C", "MPG" No results found for: "PROLACTIN" Lab Results  Component Value Date   CHOL 250 (H) 01/29/2016   TRIG 87.0 01/29/2016   HDL 65.40 01/29/2016   CHOLHDL 4 01/29/2016   VLDL 17.4 01/29/2016   LDLCALC 167 (H) 01/29/2016   Lab Results  Component Value Date   TSH 1.36 06/30/2015   TSH 4.132 08/24/2011     Therapeutic Level Labs: No results found for: "LITHIUM" Lab Results  Component Value Date   VALPROATE 32.4 (L) 10/14/2009   VALPROATE 68.2 10/19/2008   Lab Results  Component Value Date   CBMZ 8.7 10/14/2015    Current Medications: Current Outpatient Medications  Medication Sig Dispense Refill   atorvastatin (LIPITOR) 10 MG tablet Take 10 mg by mouth daily.     carbamazepine (TEGRETOL XR) 200 MG 12 hr tablet Take 200 mg by mouth 2 (two) times daily.     clonazePAM (KLONOPIN) 0.5 MG tablet Take by mouth.     ibuprofen (ADVIL) 800 MG tablet Take 1 tablet (800 mg total) by mouth 3 (three) times daily.  21 tablet 0   lamoTRIgine (LAMICTAL) 100 MG tablet Take 100 mg by mouth daily.     phentermine 15 MG capsule Take 15 mg by mouth daily.     prazosin (MINIPRESS) 1 MG capsule Take 1 capsule (1 mg total) by mouth at bedtime. 30 capsule 0   sertraline (ZOLOFT) 100 MG tablet Take 150 mg by mouth daily.     traZODone (DESYREL) 100 MG tablet Take 3 tablets by mouth at bedtime.     WEGOVY 0.5 MG/0.5ML SOAJ Inject 0.5 mg into the skin once a week.     zolpidem (AMBIEN CR) 12.5 MG CR tablet Take 1 tablet (12.5 mg total) by mouth at bedtime as needed for sleep. 10 tablet 0   UBRELVY 100 MG TABS Take by mouth.     No current facility-administered medications for this visit.     Musculoskeletal:  Psychiatric Specialty Exam: Review of Systems  Cardiovascular:  Negative for chest pain and palpitations.  Gastrointestinal: Negative.   Genitourinary: Negative.   Neurological:  Positive for dizziness and light-headedness. Negative for seizures and syncope.  Psychiatric/Behavioral:  Positive for sleep disturbance. Negative for agitation.   All other systems reviewed and are negative.   There were no vitals taken for this visit.There is no height or weight on file to calculate BMI.  General Appearance: Casual  Eye Contact:  Good  Speech:  Clear and Coherent  Volume:  Normal  Mood:   Euthymic  Affect:  Congruent  Thought Process:  Coherent  Orientation:  Full (Time, Place, and Person)  Thought Content: Logical   Suicidal Thoughts:  No  Homicidal Thoughts:  No  Memory:  Immediate;   Good Recent;   Good  Judgement:  Good  Insight:  Good  Psychomotor Activity:  Normal  Concentration:  Concentration: Good  Recall:  Good  Fund of Knowledge: Good  Language: Good  Akathisia:  No  Handed:  Right  AIMS (if indicated): not done  Assets:  Communication Skills Desire for Improvement Social Support  ADL's:  Intact  Cognition: WNL  Sleep:  Fair, improved   Screenings: AIMS    Flowsheet Row [facility]  AIMS Total Score 0      AUDIT    Flowsheet Row [facility]  Alcohol Use Disorder Identification Test Final Score (AUDIT) 0      PHQ2-9    Flowsheet Row Counselor from [facility]09[facility] Office Visit from 02/12/2016 in Primary Care at [facility] Visit from 06/30/2015 in Primary Care at [facility] Visit from 12/02/2014 in Primary Care at [facility] Visit from 09/18/2014 in Primary Care at [facility] Total Score 6 0 2 0 0  PHQ-9 Total Score 20 -- 19 -- --      Flowsheet Row Counselor from 12/09/2022 [facility] ED from 12/06/2022 in [facility] ED from 08/11/2022 in [facility] Emergency Department at [facility]  C-SSRS RISK CATEGORY Low Risk No Risk No Risk        Assessment and Plan: Shanautica Forker is a 47 year old female with a psychiatric history of bipolar 1 disorder and prolonged grief disorder who was admitted for the partial hospitalization program in the setting of significant difficulty coping and insomnia.  Since she was evaluated last week, patient reports significant  improvements to her sleep with starting prazosin  1 mg.  She has been experiencing some somatic concerns in the form of dizziness, so we will not increase her prazosin at this time.  She does still endorse some nightmares, but these have become much less.    Previously discussed with patient how the phentermine may be contributing to insomnia.  As well, patient currently taking 2 benzodiazepine medications.  Would ideally like to decrease the amount of controlled substances taken as well as the opposing effects of phentermine with the benzodiazepines, but will leave that to the discretion of outpatient psychiatrist, as patient remains acute during PHP program currently unwilling to discontinue phentermine.  Today, she additionally inquires about continuing her current regimen post discharge on Friday.  She is advised that she would be able to continue management with her primary psychiatrist, or switch psychiatrists and potentially be seen at the [facility], depending on whether her insurance is accepted.  She denies safety concerns or complaints at this time.  #Bipolar 1 disorder #Prolonged grief disorder #Insomnia Continue partial hospitalization programming (PHP) Continue Current medications: Zoloft 150 mg for depressed mood and anxiety Lamictal 100 mg once per day for mood stabilization Ambien 12.5 mg for insomnia Trazodone 300 mg insomnia Phentermine 15 mg- Takes at 4 AM. About 6 months - advised to consider this in the future as contributory to insomnia; started around same time sleep began to worsen Klonipin 0.5 mg as needed; Taking 2-3 nights weekly.  Carbemazepine 200 mg BID for mood stability Prazosin 1 mg at bedtime for nightmares.  Will increase/advise increase as tolerated in the outpatient setting    Collaboration of Care: Collaboration of Care: PHP/IOP staff, Dr. Mercy Riding  Patient/Guardian was advised Release of Information must be obtained prior to any  record release in order to collaborate their care with an outside provider. Patient/Guardian was advised if they have not already done so to contact the registration department to sign all necessary forms in order for [place] to release information regarding their care.   Consent: Patient/Guardian gives verbal consent for treatment and assignment of benefits for services provided during this visit. Patient/Guardian expressed understanding and agreed to proceed.    Lamar Sprinkles, MD 01/03/2023 4:02 PM

## 2023-01-04 ENCOUNTER — Other Ambulatory Visit (HOSPITAL_COMMUNITY): Payer: PRIVATE HEALTH INSURANCE | Admitting: Licensed Clinical Social Worker

## 2023-01-04 ENCOUNTER — Other Ambulatory Visit (HOSPITAL_COMMUNITY): Payer: PRIVATE HEALTH INSURANCE

## 2023-01-04 DIAGNOSIS — F3130 Bipolar disorder, current episode depressed, mild or moderate severity, unspecified: Secondary | ICD-10-CM

## 2023-01-04 DIAGNOSIS — R4589 Other symptoms and signs involving emotional state: Secondary | ICD-10-CM

## 2023-01-04 DIAGNOSIS — F4381 Prolonged grief disorder: Secondary | ICD-10-CM

## 2023-01-05 ENCOUNTER — Other Ambulatory Visit (HOSPITAL_COMMUNITY): Payer: PRIVATE HEALTH INSURANCE | Admitting: Licensed Clinical Social Worker

## 2023-01-05 ENCOUNTER — Encounter (HOSPITAL_COMMUNITY): Payer: Self-pay

## 2023-01-05 ENCOUNTER — Other Ambulatory Visit (HOSPITAL_COMMUNITY): Payer: PRIVATE HEALTH INSURANCE

## 2023-01-05 DIAGNOSIS — F4381 Prolonged grief disorder: Secondary | ICD-10-CM

## 2023-01-05 DIAGNOSIS — F3130 Bipolar disorder, current episode depressed, mild or moderate severity, unspecified: Secondary | ICD-10-CM

## 2023-01-05 DIAGNOSIS — R4589 Other symptoms and signs involving emotional state: Secondary | ICD-10-CM

## 2023-01-05 DIAGNOSIS — G4700 Insomnia, unspecified: Secondary | ICD-10-CM | POA: Diagnosis not present

## 2023-01-05 DIAGNOSIS — G4709 Other insomnia: Secondary | ICD-10-CM

## 2023-01-05 MED ORDER — PRAZOSIN HCL 1 MG PO CAPS: 1.0000 mg | ORAL_CAPSULE | Freq: Every day | ORAL | 0 refills | Status: DC

## 2023-01-05 MED ORDER — CARBAMAZEPINE ER 200 MG PO TB12: 200.0000 mg | ORAL_TABLET | Freq: Two times a day (BID) | ORAL | 1 refills | Status: DC

## 2023-01-05 MED ORDER — LAMOTRIGINE 100 MG PO TABS: 100.0000 mg | ORAL_TABLET | Freq: Every day | ORAL | 1 refills | Status: DC

## 2023-01-05 MED ORDER — ZOLPIDEM TARTRATE ER 12.5 MG PO TBCR: 12.5000 mg | EXTENDED_RELEASE_TABLET | Freq: Every evening | ORAL | 0 refills | Status: DC | PRN

## 2023-01-05 MED ORDER — SERTRALINE HCL 100 MG PO TABS
150.0000 mg | ORAL_TABLET | Freq: Every day | ORAL | 1 refills | Status: DC
Start: 2023-01-05 — End: 2023-03-02

## 2023-01-05 NOTE — Therapy (Signed)
[facility] PARTIAL HOSPITALIZATION PROGRAM [address] [place], [place], [address] Phone: [phone]   Fax:  [phone]  Occupational Therapy Treatment Virtual Visit via Video Note  I connected with [name] on 01/05/23 at  8:00 AM EDT by a video enabled telemedicine application and verified that I am speaking with the correct person using two identifiers.  Location: Patient: home Provider: office   I discussed the limitations of evaluation and management by telemedicine and the availability of in person appointments. The patient expressed understanding and agreed to proceed.    The patient was advised to call back or seek an in-person evaluation if the symptoms worsen or if the condition fails to improve as anticipated.  I provided 55 minutes of non-face-to-face time during this encounter.   Patient Details  Name: Stacey Weaver MRN: [id number] Date of Birth: [birth year] No data recorded  Encounter Date: 01/03/2023   OT End of Session - 01/05/23 0917     Visit Number 10    Number of Visits 20    Date for OT Re-Evaluation 01/16/23    OT Start Time 1200    OT Stop Time 1255    OT Time Calculation (min) 55 min             Past Medical History:  Diagnosis Date   Anxiety    Asthma    Bipolar 1 disorder (HCC)    Migraine     Past Surgical History:  Procedure Laterality Date   KNEE SURGERY      There were no vitals filed for this visit.   Subjective Assessment - 01/05/23 0917     Currently in Pain? No/denies    Pain Score 0-No pain                   Group Session:  S: Doing better today.   O: The primary objective of this topic is to explore and understand the concept of occupational balance in the context of daily living. The term "occupational balance" is defined broadly, encompassing all activities that occupy an individual's time and energy, including self-care, leisure, and work-related tasks.  The goal is to guide participants towards achieving a harmonious blend of these activities, tailored to their personal values and life circumstances. This balance is aimed at enhancing overall well-being, not by equally distributing time across activities, but by ensuring that daily engagements are fulfilling and not draining. The content delves into identifying various barriers that individuals face in achieving occupational balance, such as overcommitment, misaligned priorities, external pressures, and lack of effective time management. The impact of these barriers on occupational performance, roles, and lifestyles is examined, highlighting issues like reduced efficiency, strained relationships, and potential health problems. Strategies for cultivating occupational balance are a key focus. These strategies include practical methods like time blocking, prioritizing tasks, establishing self-care rituals, decluttering, connecting with nature, and engaging in reflective practices. These approaches are designed to be adaptable and applicable to a wide range of life scenarios, promoting a proactive and mindful approach to daily living. The overall aim is to equip participants with the knowledge and tools to create a balanced lifestyle that supports their mental, emotional, and physical health, thereby improving their functional performance in daily life.   A:  The patient demonstrated a high level of engagement and active participation throughout the session on occupational balance. The patient frequently contributed to discussions, offering insightful reflections on personal experiences related to the barriers and strategies  for achieving occupational balance. There was a clear understanding of the concept and an ability to relate it to their own life. The patient showed enthusiasm in learning and applying the strategies discussed, such as time blocking and self-care rituals, indicating a strong motivation to  improve their occupational balance. The patient's proactive approach and responsiveness to the topic suggest a high potential for implementing these strategies effectively in their daily routine.   P: Continue to attend PHP OT group sessions 5x week for 4 weeks to promote daily structure, social engagement, and opportunities to develop and utilize adaptive strategies to maximize functional performance in preparation for safe transition and integration back into school, work, and the community. Plan to address topic of tbd in next OT group session.                OT Education - 01/05/23 0917     Education Details Occupational Balance              OT Short Term Goals - 12/17/22 0932       OT SHORT TERM GOAL #1   Title Patient will be educated on strategies to improve psychosocial skills needed to participate fully in all daily, work, and leisure activities.    Time 4    Period Weeks    Status On-going    Target Date 01/16/23      OT SHORT TERM GOAL #2   Title Pt will apply psychosocial skills and coping mechanisms to daily activities in order to function independently and reintegrate into community dwelling    Status On-going      OT SHORT TERM GOAL #3   Title Pt will choose and/or engage in 1-3 socially engaging leisure activities to improve social participation skills upon reintegrating into community    Status On-going                      Plan - 01/05/23 0917     Psychosocial Skills Interpersonal Interaction;Routines and Behaviors;Coping Strategies;Habits             Patient will benefit from skilled therapeutic intervention in order to improve the following deficits and impairments:       Psychosocial Skills: Interpersonal Interaction, Routines and Behaviors, Coping Strategies, Habits   Visit Diagnosis: Difficulty coping    Problem List Patient Active Problem List   Diagnosis Date Noted   Prolonged grief disorder 12/09/2022    Neck pain 01/06/2017   Upper back pain 01/06/2017   Cervical radiculopathy 01/06/2017   Hyperlipidemia 01/29/2016   Severe episode of recurrent major depressive disorder, without psychotic features (HCC)    Insomnia    MDD (major depressive disorder) 10/10/2015   Bipolar I disorder, most recent episode depressed (HCC)    Feeling suicidal 10/09/2015   Rhabdomyolysis 08/08/2011   Antihistamines overdose 08/08/2011   SSRI overdose 08/07/2011   Depression 08/07/2011   Anxiety disorder 08/07/2011   BMI 35.0-35.9,adult 08/07/2011   Hypothyroidism 08/07/2011    Ted Mcalpine, OT 01/05/2023, 9:18 AM  Kerrin Champagne, OT   [facility] HOSPITALIZATION PROGRAM [address] [place], [place], [address] Phone: [phone]   Fax:  [phone]  Name: Kenora Spayd MRN: [id number] Date of Birth: October 19, 1975

## 2023-01-05 NOTE — Therapy (Signed)
[facility] PARTIAL HOSPITALIZATION PROGRAM [address] [place], [place], [address] Phone: [phone]   Fax:  [phone]  Occupational Therapy Treatment Virtual Visit via Video Note  I connected with [name] on 01/05/23 at  8:00 AM EDT by a video enabled telemedicine application and verified that I am speaking with the correct person using two identifiers.  Location: Patient: home Provider: office   I discussed the limitations of evaluation and management by telemedicine and the availability of in person appointments. The patient expressed understanding and agreed to proceed.    The patient was advised to call back or seek an in-person evaluation if the symptoms worsen or if the condition fails to improve as anticipated.  I provided 55 minutes of non-face-to-face time during this encounter.   Patient Details  Name: Stacey Weaver MRN: [id number] Date of Birth: 01-Apr-1975 No data recorded  Encounter Date: 01/04/2023   OT End of Session - 01/05/23 0929     Visit Number 11    Number of Visits 20    Date for OT Re-Evaluation 01/16/23    OT Start Time 1200    OT Stop Time 1255    OT Time Calculation (min) 55 min             Past Medical History:  Diagnosis Date   Anxiety    Asthma    Bipolar 1 disorder (HCC)    Migraine     Past Surgical History:  Procedure Laterality Date   KNEE SURGERY      There were no vitals filed for this visit.   Subjective Assessment - 01/05/23 0929     Currently in Pain? No/denies    Pain Score 0-No pain                 Group Session:  S: Feeling better today.   O: The primary objective of this topic is to explore and understand the concept of occupational balance in the context of daily living. The term "occupational balance" is defined broadly, encompassing all activities that occupy an individual's time and energy, including self-care, leisure, and work-related tasks. The  goal is to guide participants towards achieving a harmonious blend of these activities, tailored to their personal values and life circumstances. This balance is aimed at enhancing overall well-being, not by equally distributing time across activities, but by ensuring that daily engagements are fulfilling and not draining. The content delves into identifying various barriers that individuals face in achieving occupational balance, such as overcommitment, misaligned priorities, external pressures, and lack of effective time management. The impact of these barriers on occupational performance, roles, and lifestyles is examined, highlighting issues like reduced efficiency, strained relationships, and potential health problems. Strategies for cultivating occupational balance are a key focus. These strategies include practical methods like time blocking, prioritizing tasks, establishing self-care rituals, decluttering, connecting with nature, and engaging in reflective practices. These approaches are designed to be adaptable and applicable to a wide range of life scenarios, promoting a proactive and mindful approach to daily living. The overall aim is to equip participants with the knowledge and tools to create a balanced lifestyle that supports their mental, emotional, and physical health, thereby improving their functional performance in daily life.   A:  The patient demonstrated a high level of engagement and active participation throughout the session on occupational balance. The patient frequently contributed to discussions, offering insightful reflections on personal experiences related to the barriers and strategies for achieving  occupational balance. There was a clear understanding of the concept and an ability to relate it to their own life. The patient showed enthusiasm in learning and applying the strategies discussed, such as time blocking and self-care rituals, indicating a strong motivation to  improve their occupational balance. The patient's proactive approach and responsiveness to the topic suggest a high potential for implementing these strategies effectively in their daily routine.    P: Continue to attend PHP OT group sessions 5x week for 4 weeks to promote daily structure, social engagement, and opportunities to develop and utilize adaptive strategies to maximize functional performance in preparation for safe transition and integration back into school, work, and the community. Plan to address topic of tbd in next OT group session.                  OT Education - 01/05/23 0929     Education Details Occupational Balance              OT Short Term Goals - 12/17/22 0932       OT SHORT TERM GOAL #1   Title Patient will be educated on strategies to improve psychosocial skills needed to participate fully in all daily, work, and leisure activities.    Time 4    Period Weeks    Status On-going    Target Date 01/16/23      OT SHORT TERM GOAL #2   Title Pt will apply psychosocial skills and coping mechanisms to daily activities in order to function independently and reintegrate into community dwelling    Status On-going      OT SHORT TERM GOAL #3   Title Pt will choose and/or engage in 1-3 socially engaging leisure activities to improve social participation skills upon reintegrating into community    Status On-going                      Plan - 01/05/23 0929     Psychosocial Skills Interpersonal Interaction;Routines and Behaviors;Coping Strategies;Habits             Patient will benefit from skilled therapeutic intervention in order to improve the following deficits and impairments:       Psychosocial Skills: Interpersonal Interaction, Routines and Behaviors, Coping Strategies, Habits   Visit Diagnosis: Difficulty coping    Problem List Patient Active Problem List   Diagnosis Date Noted   Prolonged grief disorder 12/09/2022    Neck pain 01/06/2017   Upper back pain 01/06/2017   Cervical radiculopathy 01/06/2017   Hyperlipidemia 01/29/2016   Severe episode of recurrent major depressive disorder, without psychotic features (HCC)    Insomnia    MDD (major depressive disorder) 10/10/2015   Bipolar I disorder, most recent episode depressed (HCC)    Feeling suicidal 10/09/2015   Rhabdomyolysis 08/08/2011   Antihistamines overdose 08/08/2011   SSRI overdose 08/07/2011   Depression 08/07/2011   Anxiety disorder 08/07/2011   BMI 35.0-35.9,adult 08/07/2011   Hypothyroidism 08/07/2011    Ted Mcalpine, OT 01/05/2023, 9:29 AM  Kerrin Champagne, OT   [facility] HOSPITALIZATION PROGRAM [address] [place], [place], [address] Phone: [phone]   Fax:  [phone]  Name: Shirin Echeverry MRN: [id number] Date of Birth: [birth year]

## 2023-01-05 NOTE — Progress Notes (Unsigned)
Virtual Visit via Video Note  I connected with Stacey Weaver Co on 01/05/23 at  9:00 AM EDT by a video enabled telemedicine application and verified that I am speaking with the correct person using two identifiers.  Location: Patient: Home  Provider: Home Office   I discussed the limitations of evaluation and management by telemedicine and the availability of in person appointments. The patient expressed understanding and agreed to proceed.   I discussed the assessment and treatment plan with the patient. The patient was provided an opportunity to ask questions and all were answered. The patient agreed with the plan and demonstrated an understanding of the instructions.   The patient was advised to call back or seek an in-person evaluation if the symptoms worsen or if the condition fails to improve as anticipated.   Lamar Sprinkles, MD  Psych Resident, PGY-3 [facility] Partial Hospitalization Program Psych Discharge Summary  Stacey Weaver [id number]  Admission date: 12/16/2022 Discharge date: 01/07/2023  Reason for admission: Stacey Weaver is a 47 y.o. female with a psychiatric history of bipolar 1 disorder and prolonged grief disorder who was admitted to the partial hospitalization program in the setting of significant difficulty coping and insomnia.   Progress in Program Toward Treatment Goals: Met: Insomnia relief In Progress: Anxiety; self-regulation  Progress (rationale):  Met: Patient's insomnia has significantly improved with current medication regimen. In Progress: While patient believes she has better control of her anxiety with the coping skills and boundary-setting learned, she understands that she still has some work to do to better self-regulate.   On discharge assessment, patient reports feeling nervous about the program coming to an end and having sufficient follow-up care. She has been utilizing skills learned in group and feels better acclimated to coping  mechanisms. She appreciated the conversation surrounding boundaries.  Crying spells have been less, but she feels anxious thinking about the future.  She has been sleeping well, when taking Ambien and Prazosin. Slept through the night. Her appetite is improving, but she is focused on maintaining nutrition during the improvement period.   Denies SI, HI, AVH.    ROS   There were no vitals taken for this visit.  Psychiatric Specialty Exam: General Appearance: Casual, well groomed  Eye Contact:  Good    Speech:  Clear, coherent, normal rate   Volume:  Normal   Mood:  "much better"  Affect:  Appropriate, congruent, full range  Thought Content: Logical, rumination  Suicidal Thoughts: Denied active and passive SI    Thought Process:  Coherent, goal-directed, linear   Orientation:  A&Ox4   Memory:  Immediate good  Judgment:  Fair   Insight:  Shallow, some improved  Concentration:  Attention and concentration good  Recall:  Good  Fund of Knowledge: Good  Language: Good, fluent  Psychomotor Activity: grossly normal   Akathisia:  NA   AIMS (if indicated): NA   Assets:  Communication Skills Desire for Improvement Financial Resources/Insurance Housing Leisure Time Physical Health Resilience Social Support Transportation Vocational/Educational  ADL's:  Intact  Cognition: WNL  Sleep:  Poor-fair, but improving       Discharge Plan: Stacey Weaver made great progress throughout the duration of the program. She feels nervous but ready for discharge. She poses no safety concerns toward herself or others.   Medications adjusted throughout program to final regimen: Zoloft 150 mg for depressed mood and anxiety Lamictal 100 mg once per day for mood stabilization Ambien 12.5 mg for insomnia Trazodone 300 mg insomnia  Phentermine 15 mg- Takes at 4 AM. About 6 months - advised to consider this in the future as contributory to insomnia; started around same time sleep began to worsen Klonipin 1 mg  BID as needed; Taking 2-3 nights weekly.  Carbemazepine XR 200 mg BID for mood stability Prazosin 1 mg at bedtime for nightmares.  Will increase/advise increase as tolerated in the outpatient setting  Refills provided for medications to outpatient appointment. As she will be following with this Clinical research associate after PHP, discussed with her again how the phentermine may be contributing to insomnia. As well, patient currently taking 2 benzodiazepine medications. Would ideally like to decrease the amount of controlled substances taken as well as the opposing effects of phentermine with the benzodiazepines. When mentioning the idea of decreasing this burden, patient initially gave some resistance. She was reassured that we would not make changes today as discharge is imminent but would be working together to ensure prescribing safety and working on self-regulation so that she has less reliance on medications in the future. She calmed and was pleased. She denied further acute concerns or questions today.  Referral to Psychiatrist  Follow-up with this writer on Feb 09, 2023 at [facility] office  Patient/Guardian was advised Release of Information must be obtained prior to any record release in order to collaborate their care with an outside provider. Patient/Guardian was advised if they have not already done so to contact the registration department to sign all necessary forms in order for [place] to release information regarding their care.   Consent: Patient/Guardian gives verbal consent for treatment and assignment of benefits for services provided during this visit. Patient/Guardian expressed understanding and agreed to proceed.   Lamar Sprinkles, MD Psych Resident, PGY-3 01/05/2023

## 2023-01-06 ENCOUNTER — Other Ambulatory Visit (HOSPITAL_COMMUNITY): Payer: PRIVATE HEALTH INSURANCE | Admitting: Licensed Clinical Social Worker

## 2023-01-06 ENCOUNTER — Other Ambulatory Visit (HOSPITAL_COMMUNITY): Payer: PRIVATE HEALTH INSURANCE

## 2023-01-06 DIAGNOSIS — F3130 Bipolar disorder, current episode depressed, mild or moderate severity, unspecified: Secondary | ICD-10-CM

## 2023-01-06 DIAGNOSIS — F4381 Prolonged grief disorder: Secondary | ICD-10-CM

## 2023-01-07 ENCOUNTER — Other Ambulatory Visit (HOSPITAL_COMMUNITY): Payer: PRIVATE HEALTH INSURANCE

## 2023-01-07 ENCOUNTER — Other Ambulatory Visit (HOSPITAL_COMMUNITY): Payer: PRIVATE HEALTH INSURANCE | Admitting: Licensed Clinical Social Worker

## 2023-01-07 DIAGNOSIS — F4381 Prolonged grief disorder: Secondary | ICD-10-CM

## 2023-01-07 DIAGNOSIS — R4589 Other symptoms and signs involving emotional state: Secondary | ICD-10-CM

## 2023-01-07 DIAGNOSIS — F3130 Bipolar disorder, current episode depressed, mild or moderate severity, unspecified: Secondary | ICD-10-CM | POA: Diagnosis not present

## 2023-01-10 ENCOUNTER — Other Ambulatory Visit (HOSPITAL_COMMUNITY): Payer: PRIVATE HEALTH INSURANCE

## 2023-01-10 ENCOUNTER — Encounter (HOSPITAL_COMMUNITY): Payer: Self-pay

## 2023-01-10 NOTE — Therapy (Signed)
[facility] PARTIAL HOSPITALIZATION PROGRAM [address] [place], [place], [address] Phone: [phone]   Fax:  [phone]  Occupational Therapy Treatment Virtual Visit via Video Note  I connected with Stacey Weaver on 01/10/23 at  8:00 AM EDT by a video enabled telemedicine application and verified that I am speaking with the correct person using two identifiers.  Location: Patient: home Provider: office   I discussed the limitations of evaluation and management by telemedicine and the availability of in person appointments. The patient expressed understanding and agreed to proceed.    The patient was advised to call back or seek an in-person evaluation if the symptoms worsen or if the condition fails to improve as anticipated.  I provided 55 minutes of non-face-to-face time during this encounter.   Patient Details  Name: Stacey Weaver MRN: [id number] Date of Birth: 03-Feb-1976 No data recorded  Encounter Date: 01/05/2023   OT End of Session - 01/10/23 0843     Visit Number 12    Number of Visits 20    Date for OT Re-Evaluation 01/16/23    OT Start Time 1200    OT Stop Time 1255    OT Time Calculation (min) 55 min             Past Medical History:  Diagnosis Date   Anxiety    Asthma    Bipolar 1 disorder (HCC)    Migraine     Past Surgical History:  Procedure Laterality Date   KNEE SURGERY      There were no vitals filed for this visit.   Subjective Assessment - 01/10/23 0843     Currently in Pain? No/denies    Pain Score 0-No pain                  Group Session:  S: Doing better today.   O: The primary objective of this topic is to explore and understand the concept of occupational balance in the context of daily living. The term "occupational balance" is defined broadly, encompassing all activities that occupy an individual's time and energy, including self-care, leisure, and work-related tasks. The  goal is to guide participants towards achieving a harmonious blend of these activities, tailored to their personal values and life circumstances. This balance is aimed at enhancing overall well-being, not by equally distributing time across activities, but by ensuring that daily engagements are fulfilling and not draining. The content delves into identifying various barriers that individuals face in achieving occupational balance, such as overcommitment, misaligned priorities, external pressures, and lack of effective time management. The impact of these barriers on occupational performance, roles, and lifestyles is examined, highlighting issues like reduced efficiency, strained relationships, and potential health problems. Strategies for cultivating occupational balance are a key focus. These strategies include practical methods like time blocking, prioritizing tasks, establishing self-care rituals, decluttering, connecting with nature, and engaging in reflective practices. These approaches are designed to be adaptable and applicable to a wide range of life scenarios, promoting a proactive and mindful approach to daily living. The overall aim is to equip participants with the knowledge and tools to create a balanced lifestyle that supports their mental, emotional, and physical health, thereby improving their functional performance in daily life.   A:  The patient demonstrated a high level of engagement and active participation throughout the session on occupational balance. The patient frequently contributed to discussions, offering insightful reflections on personal experiences related to the barriers and strategies for  achieving occupational balance. There was a clear understanding of the concept and an ability to relate it to their own life. The patient showed enthusiasm in learning and applying the strategies discussed, such as time blocking and self-care rituals, indicating a strong motivation to  improve their occupational balance. The patient's proactive approach and responsiveness to the topic suggest a high potential for implementing these strategies effectively in their daily routine.    P: Continue to attend PHP OT group sessions 5x week for 4 weeks to promote daily structure, social engagement, and opportunities to develop and utilize adaptive strategies to maximize functional performance in preparation for safe transition and integration back into school, work, and the community. Plan to address topic of tbd in next OT group session.                 OT Education - 01/10/23 0843     Education Details Occupational Balance              OT Short Term Goals - 12/17/22 0932       OT SHORT TERM GOAL #1   Title Patient will be educated on strategies to improve psychosocial skills needed to participate fully in all daily, work, and leisure activities.    Time 4    Period Weeks    Status On-going    Target Date 01/16/23      OT SHORT TERM GOAL #2   Title Pt will apply psychosocial skills and coping mechanisms to daily activities in order to function independently and reintegrate into community dwelling    Status On-going      OT SHORT TERM GOAL #3   Title Pt will choose and/or engage in 1-3 socially engaging leisure activities to improve social participation skills upon reintegrating into community    Status On-going                      Plan - 01/10/23 0843     Occupational performance deficits (Please refer to evaluation for details): Rest and Sleep;Work;Social Participation;Leisure    Psychosocial Skills Interpersonal Interaction;Routines and Behaviors;Coping Strategies;Habits             Patient will benefit from skilled therapeutic intervention in order to improve the following deficits and impairments:       Psychosocial Skills: Interpersonal Interaction, Routines and Behaviors, Coping Strategies, Habits   Visit  Diagnosis: Difficulty coping    Problem List Patient Active Problem List   Diagnosis Date Noted   Prolonged grief disorder 12/09/2022   Neck pain 01/06/2017   Upper back pain 01/06/2017   Cervical radiculopathy 01/06/2017   Hyperlipidemia 01/29/2016   Severe episode of recurrent major depressive disorder, without psychotic features (HCC)    Insomnia    MDD (major depressive disorder) 10/10/2015   Bipolar I disorder, most recent episode depressed (HCC)    Feeling suicidal 10/09/2015   Rhabdomyolysis 08/08/2011   Antihistamines overdose 08/08/2011   SSRI overdose 08/07/2011   Depression 08/07/2011   Anxiety disorder 08/07/2011   BMI 35.0-35.9,adult 08/07/2011   Hypothyroidism 08/07/2011    Ted Mcalpine, OT 01/10/2023, 8:44 AM  Kerrin Champagne, OT   [facility] HOSPITALIZATION PROGRAM [address] [place], [place], [address] Phone: [phone]   Fax:  [phone]  Name: Stacey Weaver MRN: [id number] Date of Birth: [birth year]

## 2023-01-10 NOTE — Therapy (Signed)
[facility] PARTIAL HOSPITALIZATION PROGRAM [address] [place], [place], [address] Phone: [phone]   Fax:  [phone]  Occupational Therapy Treatment Virtual Visit via Video Note  I connected with Stacey Weaver on 01/10/23 at  8:00 AM EDT by a video enabled telemedicine application and verified that I am speaking with the correct person using two identifiers.  Location: Patient: home Provider: office   I discussed the limitations of evaluation and management by telemedicine and the availability of in person appointments. The patient expressed understanding and agreed to proceed.    The patient was advised to call back or seek an in-person evaluation if the symptoms worsen or if the condition fails to improve as anticipated.  I provided 55 minutes of non-face-to-face time during this encounter.   Patient Details  Name: Stacey Weaver MRN: [id number] Date of Birth: Feb 12, 1976 No data recorded  Encounter Date: 01/07/2023   OT End of Session - 01/10/23 0913     Visit Number 13    Number of Visits 20    Date for OT Re-Evaluation 01/16/23    OT Start Time 1200    OT Stop Time 1255    OT Time Calculation (min) 55 min             Past Medical History:  Diagnosis Date   Anxiety    Asthma    Bipolar 1 disorder (HCC)    Migraine     Past Surgical History:  Procedure Laterality Date   KNEE SURGERY      There were no vitals filed for this visit.   Subjective Assessment - 01/10/23 0913     Currently in Pain? No/denies    Pain Score 0-No pain                 OCCUPATIONAL THERAPY DISCHARGE SUMMARY  Visits from Start of Care: 13  Current functional level related to goals / functional outcomes: Pt has met all OT goals at this time. Pt will be discharged on this date.    Remaining deficits: There are no remaining OT deficits at this time.     Plan: Patient is being discharged due to meeting the stated rehab  goals.         Group Session:  O: During the group therapy session, the occupational therapist discussed the impact of sleep disturbances on daily activities and overall health and wellbeing.   The OT also reviewed various types of sleep disorders, including insomnia, sleep apnea, restless leg syndrome, and narcolepsy, and their associated symptoms. Strategies for managing and treating sleep disturbances were also discussed, such as establishing a consistent sleep routine, avoiding stimulants before bedtime, and engaging in relaxation techniques.   Today's group also included information on how sleep disturbances can cause fatigue, mood changes, cognitive impairment, and physical health problems, and emphasizes the importance of seeking prompt treatment to maintain overall health and wellbeing.   A: In today's session, the patient demonstrated active engagement with the topic of The Importance of Sleep. They eagerly asked questions, contributed personal experiences, and showcased a noticeable eagerness to apply the discussed principles. Their participation indicated not only a strong understanding of the subject matter but also an intrinsic motivation to implement better sleep practices in their daily routine. Based on their proactive involvement, it is assessed that the patient greatly benefited from today's treatment and will likely make efforts to incorporate the insights gained.  OT Education - 01/10/23 0913     Education Details Sleep              OT Short Term Goals - 12/17/22 0932       OT SHORT TERM GOAL #1   Title Patient will be educated on strategies to improve psychosocial skills needed to participate fully in all daily, work, and leisure activities.    Time 4    Period Weeks    Status On-going    Target Date 01/16/23      OT SHORT TERM GOAL #2   Title Pt will apply psychosocial skills and coping mechanisms to daily activities in order to  function independently and reintegrate into community dwelling    Status On-going      OT SHORT TERM GOAL #3   Title Pt will choose and/or engage in 1-3 socially engaging leisure activities to improve social participation skills upon reintegrating into community    Status On-going                      Plan - 01/10/23 0913     Psychosocial Skills Interpersonal Interaction;Routines and Behaviors;Coping Strategies;Habits             Patient will benefit from skilled therapeutic intervention in order to improve the following deficits and impairments:       Psychosocial Skills: Interpersonal Interaction, Routines and Behaviors, Coping Strategies, Habits   Visit Diagnosis: Difficulty coping    Problem List Patient Active Problem List   Diagnosis Date Noted   Prolonged grief disorder 12/09/2022   Neck pain 01/06/2017   Upper back pain 01/06/2017   Cervical radiculopathy 01/06/2017   Hyperlipidemia 01/29/2016   Severe episode of recurrent major depressive disorder, without psychotic features (HCC)    Insomnia    MDD (major depressive disorder) 10/10/2015   Bipolar I disorder, most recent episode depressed (HCC)    Feeling suicidal 10/09/2015   Rhabdomyolysis 08/08/2011   Antihistamines overdose 08/08/2011   SSRI overdose 08/07/2011   Depression 08/07/2011   Anxiety disorder 08/07/2011   BMI 35.0-35.9,adult 08/07/2011   Hypothyroidism 08/07/2011    Ted Mcalpine, OT 01/10/2023, 9:14 AM  Kerrin Champagne, OT   [facility] HOSPITALIZATION PROGRAM [address] [place], [place], [address] Phone: [phone]   Fax:  [phone]  Name: Stacey Weaver MRN: [id number] Date of Birth: 19-May-1975

## 2023-01-11 ENCOUNTER — Other Ambulatory Visit (HOSPITAL_COMMUNITY): Payer: PRIVATE HEALTH INSURANCE

## 2023-01-12 ENCOUNTER — Other Ambulatory Visit (HOSPITAL_COMMUNITY): Payer: PRIVATE HEALTH INSURANCE

## 2023-01-13 ENCOUNTER — Other Ambulatory Visit (HOSPITAL_COMMUNITY): Payer: PRIVATE HEALTH INSURANCE

## 2023-01-14 ENCOUNTER — Other Ambulatory Visit (HOSPITAL_COMMUNITY): Payer: PRIVATE HEALTH INSURANCE

## 2023-01-14 ENCOUNTER — Other Ambulatory Visit: Payer: Self-pay

## 2023-01-14 ENCOUNTER — Encounter (HOSPITAL_BASED_OUTPATIENT_CLINIC_OR_DEPARTMENT_OTHER): Payer: Self-pay

## 2023-01-14 ENCOUNTER — Emergency Department (HOSPITAL_BASED_OUTPATIENT_CLINIC_OR_DEPARTMENT_OTHER): Payer: PRIVATE HEALTH INSURANCE

## 2023-01-14 ENCOUNTER — Emergency Department (HOSPITAL_BASED_OUTPATIENT_CLINIC_OR_DEPARTMENT_OTHER)
Admission: EM | Admit: 2023-01-14 | Discharge: 2023-01-15 | Disposition: A | Discharge status: Home / Self Care | Payer: PRIVATE HEALTH INSURANCE | Attending: Emergency Medicine | Admitting: Emergency Medicine

## 2023-01-14 DIAGNOSIS — E876 Hypokalemia: Secondary | ICD-10-CM

## 2023-01-14 DIAGNOSIS — J45909 Unspecified asthma, uncomplicated: Secondary | ICD-10-CM | POA: Insufficient documentation

## 2023-01-14 DIAGNOSIS — R5383 Other fatigue: Secondary | ICD-10-CM

## 2023-01-14 DIAGNOSIS — R0602 Shortness of breath: Secondary | ICD-10-CM | POA: Insufficient documentation

## 2023-01-14 LAB — COMPREHENSIVE METABOLIC PANEL
ALT: 11 U/L (ref 0–44)
AST: 17 U/L (ref 15–41)
Albumin: 4.1 g/dL (ref 3.5–5.0)
Alkaline Phosphatase: 79 U/L (ref 38–126)
Anion gap: 10 (ref 5–15)
BUN: 8 mg/dL (ref 6–20)
CO2: 22 mmol/L (ref 22–32)
Calcium: 9 mg/dL (ref 8.9–10.3)
Chloride: 103 mmol/L (ref 98–111)
Creatinine, Ser: 0.76 mg/dL (ref 0.44–1.00)
GFR, Estimated: >60 mL/min (ref >60)
Glucose, Bld: 90 mg/dL (ref 70–99)
Potassium: 3 mmol/L — ABNORMAL LOW (ref 3.5–5.1)
Sodium: 135 mmol/L (ref 135–145)
Total Bilirubin: 0.5 mg/dL (ref 0.3–1.2)
Total Protein: 7.5 g/dL (ref 6.5–8.1)

## 2023-01-14 LAB — CBC
HCT: 38.1 % (ref 36.0–46.0)
Hemoglobin: 12.3 g/dL (ref 12.0–15.0)
MCH: 28.9 pg (ref 26.0–34.0)
MCHC: 32.3 g/dL (ref 30.0–36.0)
MCV: 89.4 fL (ref 80.0–100.0)
Platelets: 219 10*3/uL (ref 150–400)
RBC: 4.26 MIL/uL (ref 3.87–5.11)
RDW: 12.2 % (ref 11.5–15.5)
WBC: 5.6 10*3/uL (ref 4.0–10.5)
nRBC: 0 % (ref 0.0–0.2)

## 2023-01-14 LAB — TROPONIN I (HIGH SENSITIVITY): Troponin I (High Sensitivity): <2 ng/L (ref <18)

## 2023-01-14 LAB — PREGNANCY, URINE: Preg Test, Ur: NEGATIVE

## 2023-01-14 MED ORDER — IOHEXOL 350 MG/ML SOLN
75.0000 mL | Freq: Once | INTRAVENOUS | Status: AC | PRN
  Administered 2023-01-15: 75 mL via INTRAVENOUS

## 2023-01-14 MED ORDER — POTASSIUM CHLORIDE CRYS ER 20 MEQ PO TBCR
40.0000 meq | EXTENDED_RELEASE_TABLET | Freq: Once | ORAL | Status: AC
  Administered 2023-01-15: 40 meq via ORAL
  Filled 2023-01-14: qty 2

## 2023-01-14 NOTE — ED Provider Notes (Signed)
[facility] Provider Note   CSN: [id number] Arrival date & time: 01/14/23  2158     History {Add pertinent medical, surgical, social history, OB history to HPI:1} Chief Complaint  Patient presents with   Shortness of Breath    Stacey Weaver is a 47 y.o. female.  The history is provided by the patient.  Shortness of Breath Severity:  Moderate Onset quality:  Gradual Duration:  2 days Timing:  Constant Progression:  Unchanged Chronicity:  New Context comment:  Fatigue, headache for about a week.  No travel no OCP. Relieved by:  Nothing Worsened by:  Nothing Ineffective treatments:  None tried Associated symptoms: no chest pain, no fever, no vomiting and no wheezing   Risk factors: no oral contraceptive use and no tobacco use   Patient with Bipolar 1 disorder presents with fatigue and headache for a week with negative covid and flu testing and 2 days of ongoing shortness of breath.  No CP.  No n/v/d.      Past Medical History:  Diagnosis Date   Anxiety    Asthma    Bipolar 1 disorder (HCC)    Migraine      Home Medications Prior to Admission medications   Medication Sig Start Date End Date Taking? Authorizing Provider  atorvastatin (LIPITOR) 10 MG tablet Take 10 mg by mouth daily. 04/29/22 04/29/23  [provider]  carbamazepine (TEGRETOL XR) 200 MG 12 hr tablet Take 1 tablet (200 mg total) by mouth 2 (two) times daily. 01/05/23 03/06/23  Lamar Sprinkles, MD  clonazePAM (KLONOPIN) 0.5 MG tablet Take by mouth.    [provider]  ibuprofen (ADVIL) 800 MG tablet Take 1 tablet (800 mg total) by mouth 3 (three) times daily. 09/13/19   Jacalyn Lefevre, MD  lamoTRIgine (LAMICTAL) 100 MG tablet Take 1 tablet (100 mg total) by mouth daily. 01/05/23 03/06/23  Lamar Sprinkles, MD  phentermine 15 MG capsule Take 15 mg by mouth daily. 07/19/22   [provider]  prazosin (MINIPRESS) 1 MG capsule Take 1  capsule (1 mg total) by mouth at bedtime. 01/29/23 02/28/23  Lamar Sprinkles, MD  sertraline (ZOLOFT) 100 MG tablet Take 1.5 tablets (150 mg total) by mouth daily. 01/05/23 03/06/23  Lamar Sprinkles, MD  traZODone (DESYREL) 100 MG tablet Take 3 tablets by mouth at bedtime.    [provider]  UBRELVY 100 MG TABS Take by mouth.    [provider]  WEGOVY 0.5 MG/0.5ML SOAJ Inject 0.5 mg into the skin once a week. 07/26/22   [provider]  zolpidem (AMBIEN CR) 12.5 MG CR tablet Take 1 tablet (12.5 mg total) by mouth at bedtime as needed for sleep. 01/06/23 02/10/23  Lamar Sprinkles, MD      Allergies    Almotriptan malate, Cymbalta [duloxetine hcl], Imitrex [sumatriptan], Prednisone, Zomig, Geodon [ziprasidone hcl], Lactose intolerance (gi), Penicillins, and Zyprexa [olanzapine]    Review of Systems   Review of Systems  Constitutional:  Positive for fatigue. Negative for fever.  HENT:  Negative for facial swelling.   Eyes:  Negative for photophobia.  Respiratory:  Positive for shortness of breath. Negative for wheezing and stridor.   Cardiovascular:  Negative for chest pain.  Gastrointestinal:  Negative for vomiting.  All other systems reviewed and are negative.   Physical Exam Updated Vital Signs BP 121/67   Pulse 65   Temp 97.8 F (36.6 C) (Oral)   Resp (!) 22   Ht  5\' 4"  (1.626 m)   Wt 81.6 kg   SpO2 100%   BMI 30.90 kg/m  Physical Exam Constitutional:      General: She is not in acute distress.    Appearance: She is well-developed.  HENT:     Head: Normocephalic and atraumatic.     Nose: Nose normal.  Eyes:     Pupils: Pupils are equal, round, and reactive to light.  Cardiovascular:     Rate and Rhythm: Normal rate and regular rhythm.     Pulses: Normal pulses.     Heart sounds: Normal heart sounds.  Pulmonary:     Effort: No respiratory distress.     Breath sounds: Normal breath sounds. No wheezing or rales.  Abdominal:     General: Bowel  sounds are normal. There is no distension.     Palpations: Abdomen is soft.     Tenderness: There is no abdominal tenderness. There is no guarding or rebound.  Musculoskeletal:        General: No tenderness. Normal range of motion.     Cervical back: Normal range of motion and neck supple.     Right lower leg: No edema.     Left lower leg: No edema.  Skin:    General: Skin is warm and dry.     Capillary Refill: Capillary refill takes less than 2 seconds.     Findings: No erythema or rash.  Neurological:     Mental Status: She is oriented to person, place, and time.     ED Results / Procedures / Treatments   Labs (all labs ordered are listed, but only abnormal results are displayed) Results for orders placed or performed during the hospital encounter of 01/14/23  CBC  Result Value Ref Range   WBC 5.6 4.0 - 10.5 K/uL   RBC 4.26 3.87 - 5.11 MIL/uL   Hemoglobin 12.3 12.0 - 15.0 g/dL   HCT 28.4 13.2 - 44.0 %   MCV 89.4 80.0 - 100.0 fL   MCH 28.9 26.0 - 34.0 pg   MCHC 32.3 30.0 - 36.0 g/dL   RDW 10.2 72.5 - 36.6 %   Platelets 219 150 - 400 K/uL   nRBC 0.0 0.0 - 0.2 %  Comprehensive metabolic panel  Result Value Ref Range   Sodium 135 135 - 145 mmol/L   Potassium 3.0 (L) 3.5 - 5.1 mmol/L   Chloride 103 98 - 111 mmol/L   CO2 22 22 - 32 mmol/L   Glucose, Bld 90 70 - 99 mg/dL   BUN 8 6 - 20 mg/dL   Creatinine, Ser 4.40 0.44 - 1.00 mg/dL   Calcium 9.0 8.9 - 34.7 mg/dL   Total Protein 7.5 6.5 - 8.1 g/dL   Albumin 4.1 3.5 - 5.0 g/dL   AST 17 15 - 41 U/L   ALT 11 0 - 44 U/L   Alkaline Phosphatase 79 38 - 126 U/L   Total Bilirubin 0.5 0.3 - 1.2 mg/dL   GFR, Estimated >42 >59 mL/min   Anion gap 10 5 - 15  Pregnancy, urine  Result Value Ref Range   Preg Test, Ur NEGATIVE NEGATIVE  Troponin I (High Sensitivity)  Result Value Ref Range   Troponin I (High Sensitivity) <2 <18 ng/L   No results found.   EKG NSR rate 67 normal intervals    Radiology No results  found.  Procedures Procedures  {Document cardiac monitor, telemetry assessment procedure when appropriate:1}  Medications Ordered in ED Medications -  No data to display  ED Course/ Medical Decision Making/ A&P   {   Click here for ABCD2, HEART and other calculatorsREFRESH Note before signing :1}                              Medical Decision Making Patient with shortness of breath x 2 days and a week of fatigue   Amount and/or Complexity of Data Reviewed External Data Reviewed: notes.    Details: Previous outpatient notes revieewed  Labs: ordered.    Details: Pregnancy is negative troponin is < 2 and normal.  Normal white count 5.6, normal hemoglobin 12.3, normal platelet count.  Normal sodium 135, potassium slightly low 3, normal creatinine 0.76, normal LFTs  Radiology: ordered and independent interpretation performed. ECG/medicine tests: ordered and independent interpretation performed. Decision-making details documented in ED Course.  Risk Risk Details: Based on time course of symptoms one negative EKG and troponin is sufficient to rule out ACS.  Heart score is 1 very low risk for MACE.  Patient has also ruled out for PE and PNA in the department.  Patient has clear lungs with normal oxygen saturation.  Different is GERD, laryngeal reflux.  Will refer back to PMD and pulmonary for ongoing testing.  Stable for discharge.      Final Clinical Impression(s) / ED Diagnoses Final diagnoses:  None   Return for intractable cough, coughing up blood, fevers > 100.4 unrelieved by medication, shortness of breath, intractable vomiting, chest pain, shortness of breath, weakness, numbness, changes in speech, facial asymmetry, abdominal pain, passing out, Inability to tolerate liquids or food, cough, altered mental status or any concerns. No signs of systemic illness or infection. The patient is nontoxic-appearing on exam and vital signs are within normal limits.  I have reviewed the triage  vital signs and the nursing notes. Pertinent labs & imaging results that were available during my care of the patient were reviewed by me and considered in my medical decision making (see chart for details). After history, exam, and medical workup I feel the patient has been appropriately medically screened and is safe for discharge home. Pertinent diagnoses were discussed with the patient. Patient was given return precautions.    Rx / DC Orders ED Discharge Orders     None

## 2023-01-14 NOTE — ED Triage Notes (Signed)
Says she has been having shortness of breath x 4 days and new onset of mid- sternal chest pains that manifested today. Intermittent pain also present in left calf.   Presented to UC earlier for evaluation. Negative respiratory / strept, and blood work.   Says she just does not feel right.

## 2023-01-14 NOTE — ED Notes (Signed)
Patient transported to CT 

## 2023-01-15 LAB — TROPONIN I (HIGH SENSITIVITY): Troponin I (High Sensitivity): <2 ng/L (ref <18)

## 2023-01-15 MED ORDER — ACETAMINOPHEN 500 MG PO TABS
1000.0000 mg | ORAL_TABLET | Freq: Once | ORAL | Status: AC
  Administered 2023-01-15: 1000 mg via ORAL
  Filled 2023-01-15: qty 2

## 2023-01-15 NOTE — ED Notes (Signed)
MD and RN at bedside. Patient updated on results and plan of care by MD. Opportunities for questions and concerns addressed.

## 2023-01-26 NOTE — Psych (Unsigned)
Virtual Visit via Video Note  I connected with Stacey Weaver on 01/05/23 at  9:00 AM EDT by a video enabled telemedicine application and verified that I am speaking with the correct person using two identifiers.  Location: Patient: patient home Provider: clinical home office   I discussed the limitations of evaluation and management by telemedicine and the availability of in person appointments. The patient expressed understanding and agreed to proceed.  I discussed the assessment and treatment plan with the patient. The patient was provided an opportunity to ask questions and all were answered. The patient agreed with the plan and demonstrated an understanding of the instructions.   The patient was advised to call back or seek an in-person evaluation if the symptoms worsen or if the condition fails to improve as anticipated.  Pt was provided 240 minutes of non-face-to-face time during this encounter.   Donia Guiles, LCSW   [facility] PHP THERAPIST PROGRESS NOTE  Stacey Weaver [id number]  Session Time: 9:00 - 10:00  Participation Level: Active  Behavioral Response: CasualAlertAnxious and Depressed  Type of Therapy: Group Therapy  Treatment Goals addressed: Coping  Progress Towards Goals: Progressing  Interventions: CBT, DBT, Supportive, and Reframing  Summary: Stacey Weaver is a 47 y.o. female who presents with mood symptoms and grief.  Clinician led check-in regarding current stressors and situation, and review of patient completed daily inventory. Clinician utilized active listening and empathetic response and validated patient emotions. Clinician facilitated processing group on pertinent issues.?    Therapist Response:  Patient arrived within time allowed. Patient rates her mood at a 4 on a scale of 1-10 with 10 being best. Pt states she feels "really anxious." Pt states she slept 8.5 hours with no nightmares and ate 1x. Pt reports worry about future and  upcoming discharge from PHP. Pt reports ruminating on not wanting to slide back to where she was. Pt reports continued headaches.  Patient able to process. Patient engaged in discussion.          Session Time: 10:00 am - 11:00 am   Participation Level: Active   Behavioral Response: CasualAlertDepressed   Type of Therapy: Group Therapy   Treatment Goals addressed: Coping   Progress Towards Goals: Progressing   Interventions: CBT, DBT, Solution Focused, Strength-based, Supportive, and Reframing   Therapist Response: Cln led processing group for pt's current struggles. Group members shared stressors and provided support and feedback. Cln brought in topics of boundaries, healthy relationships, and unhealthy thought processes to inform discussion.    Therapist Response: Pt able to process and provide feedback to group.         Session Time: 11:00 -12:00   Participation Level: Active   Behavioral Response: CasualAlertDepressed   Type of Therapy: Group Therapy   Treatment Goals addressed: Coping   Progress Towards Goals: Progressing   Interventions: Strength-based, Supportive, and Reframing   Summary: Chaplaincy group with K. Claussen   Therapist Response: Pt participated and engaged in discussion.          Session Time: 12:00 -1:00   Participation Level: Active   Behavioral Response: CasualAlertDepressed   Type of Therapy: Group therapy   Treatment Goals addressed: Coping   Progress Towards Goals: Progressing   Interventions: OT group   Summary: 12:00 - 12:50: Occupational Therapy group with cln E. Hollan.  12:50 - 1:00 Clinician assessed for immediate needs, medication compliance and efficacy, and safety concerns.   Therapist Response: 12:00 - 12:50: See note 12:50 - 1:00  pm: At check-out, patient reports no immediate concerns. Patient demonstrates progress as evidenced by continued engagement and responsiveness to treatment. Patient denies SI/HI/self-harm  thoughts at the end of group.     Suicidal/Homicidal: Nowithout intent/plan  Plan: Pt will continue in PHP while working to decrease depression/anxiety/grief symptoms, increase emotion regulation, and increase ability to manage symptoms in a healthy manner.   Collaboration of Care: Medication Management AEB T Lewis  Patient/Guardian was advised Release of Information must be obtained prior to any record release in order to collaborate their care with an outside provider. Patient/Guardian was advised if they have not already done so to contact the registration department to sign all necessary forms in order for [place] to release information regarding their care.   Consent: Patient/Guardian gives verbal consent for treatment and assignment of benefits for services provided during this visit. Patient/Guardian expressed understanding and agreed to proceed.   Diagnosis: Bipolar I disorder, most recent episode depressed (HCC) [F31.30]    1. Bipolar I disorder, most recent episode depressed (HCC)   2. Difficulty coping   3. Prolonged grief disorder   4. Other insomnia       Donia Guiles, LCSW

## 2023-01-26 NOTE — Psych (Signed)
Virtual Visit via Video Note  I connected with Stacey Weaver on 12/28/22 at  9:00 AM EDT by a video enabled telemedicine application and verified that I am speaking with the correct person using two identifiers.  Location: Patient: patient home Provider: clinical home office   I discussed the limitations of evaluation and management by telemedicine and the availability of in person appointments. The patient expressed understanding and agreed to proceed.  I discussed the assessment and treatment plan with the patient. The patient was provided an opportunity to ask questions and all were answered. The patient agreed with the plan and demonstrated an understanding of the instructions.   The patient was advised to call back or seek an in-person evaluation if the symptoms worsen or if the condition fails to improve as anticipated.  Pt was provided 240 minutes of non-face-to-face time during this encounter.   Donia Guiles, LCSW   [facility] PHP THERAPIST PROGRESS NOTE  Stacey Weaver [id number]  Session Time: 9:00 - 10:00  Participation Level: Active  Behavioral Response: CasualAlertAnxious and Depressed  Type of Therapy: Group Therapy  Treatment Goals addressed: Coping  Progress Towards Goals: Progressing  Interventions: CBT, DBT, Supportive, and Reframing  Summary: Stacey Weaver is a 47 y.o. female who presents with mood symptoms and grief.  Clinician led check-in regarding current stressors and situation, and review of patient completed daily inventory. Clinician utilized active listening and empathetic response and validated patient emotions. Clinician facilitated processing group on pertinent issues.?    Therapist Response:  Patient arrived within time allowed. Patient rates her mood at a 2 on a scale of 1-10 with 10 being best. Pt states she feels "not good." Pt states she slept 1.5 hours and ate 0x. Pt states she felt "scared to be by myself" last night and  called a friend to come over and stay with her. Pt states she had a "horrible" nightmare and felt "rage" and her emotional "gates exploded." Pt states she cried, screamed, and threw things. Pt identifies passive SI and denies plan/intent, reporting continued protective factor of promising her mother that she would take care of her brother. Patient able to process. Patient engaged in discussion.          Session Time: 10:00 am - 11:00 am   Participation Level: Active   Behavioral Response: CasualAlertDepressed   Type of Therapy: Group Therapy   Treatment Goals addressed: Coping   Progress Towards Goals: Progressing   Interventions: CBT, DBT, Solution Focused, Strength-based, Supportive, and Reframing   Therapist Response: Cln led processing group for pt's current struggles. Group members shared stressors and provided support and feedback. Cln brought in topics of boundaries, healthy relationships, and unhealthy thought processes to inform discussion.    Therapist Response: Pt able to process and demonstrates growth in terms of vulnerability and openness in group. Pt expressed feelings beyond those on the anger spectrum and was receptive and open to feedback/support from group members. Pt notably brighter in affect after this session.          Session Time: 11:00 -12:00   Participation Level: Active   Behavioral Response: CasualAlertDepressed   Type of Therapy: Group Therapy   Treatment Goals addressed: Coping   Progress Towards Goals: Progressing   Interventions: CBT, DBT, Solution Focused, Strength-based, Supportive, and Reframing   Summary: Cln led discussion on DBT dialectics and balance. Cln encouraged pt's to utilize AND statements to cue their brain to hold two opposing ideas. Cln provided examples and  group members created their own AND statements.  Therapist Response: Pt engaged in discussion and created AND statement around recognzing progress.          Session  Time: 12:00 -1:00   Participation Level: Active   Behavioral Response: CasualAlertDepressed   Type of Therapy: Group therapy   Treatment Goals addressed: Coping   Progress Towards Goals: Progressing   Interventions: OT group   Summary: 12:00 - 12:50: Occupational Therapy group with cln E. Hollan.  12:50 - 1:00 Clinician assessed for immediate needs, medication compliance and efficacy, and safety concerns.   Therapist Response: 12:00 - 12:50: See note 12:50 - 1:00 pm: At check-out, patient reports no immediate concerns. Patient demonstrates progress as evidenced by continued engagement and responsiveness to treatment. Patient denies SI/HI/self-harm thoughts at the end of group.     Suicidal/Homicidal: Nowithout intent/plan  Plan: Pt will continue in PHP while working to decrease depression/anxiety/grief symptoms, increase emotion regulation, and increase ability to manage symptoms in a healthy manner.   Collaboration of Care: Medication Management AEB T Lewis  Patient/Guardian was advised Release of Information must be obtained prior to any record release in order to collaborate their care with an outside provider. Patient/Guardian was advised if they have not already done so to contact the registration department to sign all necessary forms in order for [place] to release information regarding their care.   Consent: Patient/Guardian gives verbal consent for treatment and assignment of benefits for services provided during this visit. Patient/Guardian expressed understanding and agreed to proceed.   Diagnosis: Bipolar I disorder, most recent episode depressed (HCC) [F31.30]    1. Bipolar I disorder, most recent episode depressed (HCC)   2. Prolonged grief disorder       Donia Guiles, LCSW

## 2023-01-26 NOTE — Psych (Signed)
Virtual Visit via Video Note  I connected with Stacey Weaver on 01/04/23 at  9:00 AM EDT by a video enabled telemedicine application and verified that I am speaking with the correct person using two identifiers.  Location: Patient: patient home Provider: clinical home office   I discussed the limitations of evaluation and management by telemedicine and the availability of in person appointments. The patient expressed understanding and agreed to proceed.  I discussed the assessment and treatment plan with the patient. The patient was provided an opportunity to ask questions and all were answered. The patient agreed with the plan and demonstrated an understanding of the instructions.   The patient was advised to call back or seek an in-person evaluation if the symptoms worsen or if the condition fails to improve as anticipated.  Pt was provided 240 minutes of non-face-to-face time during this encounter.   Donia Guiles, LCSW   [facility] PHP THERAPIST PROGRESS NOTE  Stacey Weaver [id number]  Session Time: 9:00 - 10:00  Participation Level: Active  Behavioral Response: CasualAlertAnxious and Depressed  Type of Therapy: Group Therapy  Treatment Goals addressed: Coping  Progress Towards Goals: Progressing  Interventions: CBT, DBT, Supportive, and Reframing  Summary: Stacey Weaver is a 47 y.o. female who presents with mood symptoms and grief.  Clinician led check-in regarding current stressors and situation, and review of patient completed daily inventory. Clinician utilized active listening and empathetic response and validated patient emotions. Clinician facilitated processing group on pertinent issues.?    Therapist Response:  Patient arrived within time allowed. Patient rates her mood at a 4 on a scale of 1-10 with 10 being best. Pt states she feels "okay." Pt states she slept 6 hours and ate 2x. Pt reports she is dealing with a migraine and had an allergic  reaction from some chapstick and her lips are hurting. Pt states she is taking Benadryl to manage. Pt reports she slept the best she has in a long time last night and felt it was "deep, restful" sleep. Pt states she has noticed her grief shifting and becoming less hurtful and all consuming. Patient able to process. Patient engaged in discussion.          Session Time: 10:00 am - 11:00 am   Participation Level: Active   Behavioral Response: CasualAlertDepressed   Type of Therapy: Group Therapy   Treatment Goals addressed: Coping   Progress Towards Goals: Progressing   Interventions: CBT, DBT, Solution Focused, Strength-based, Supportive, and Reframing   Summary: Cln led discussion on rest. Cln discussed the need to rewrite social story of rest being earned or last on the list and assert that rest is productive. Group members discussed barriers to allowing themselves rest and the negative self-talk involved.  Cln worked with group to thought challenge and apply self-coaching strategies.   Therapist Response:  Pt engaged in discussion and reports struggle with allowing themselves rest.         Session Time: 11:00 -12:00   Participation Level: Active   Behavioral Response: CasualAlertDepressed   Type of Therapy: Group Therapy   Treatment Goals addressed: Coping   Progress Towards Goals: Progressing   Interventions: CBT, DBT, Solution Focused, Strength-based, Supportive, and Reframing   Summary: Cln introduced DBT emotion regulation skill, Positive Events. Cln discussed how engaging in positive events both in the moment and over time can shift away from negative outlook. Group discussed ways to apply the skill.   Therapist Response: Pt engaged in discussion and  is able to identify ways to utilize positive events skill.          Session Time: 12:00 -1:00   Participation Level: Active   Behavioral Response: CasualAlertDepressed   Type of Therapy: Group therapy    Treatment Goals addressed: Coping   Progress Towards Goals: Progressing   Interventions: OT group   Summary: 12:00 - 12:50: Occupational Therapy group with cln E. Hollan.  12:50 - 1:00 Clinician assessed for immediate needs, medication compliance and efficacy, and safety concerns.   Therapist Response: 12:00 - 12:50: See note 12:50 - 1:00 pm: At check-out, patient reports no immediate concerns. Patient demonstrates progress as evidenced by continued engagement and responsiveness to treatment. Patient denies SI/HI/self-harm thoughts at the end of group.     Suicidal/Homicidal: Nowithout intent/plan  Plan: Pt will continue in PHP while working to decrease depression/anxiety/grief symptoms, increase emotion regulation, and increase ability to manage symptoms in a healthy manner.   Collaboration of Care: Medication Management AEB C Cosby  Patient/Guardian was advised Release of Information must be obtained prior to any record release in order to collaborate their care with an outside provider. Patient/Guardian was advised if they have not already done so to contact the registration department to sign all necessary forms in order for [place] to release information regarding their care.   Consent: Patient/Guardian gives verbal consent for treatment and assignment of benefits for services provided during this visit. Patient/Guardian expressed understanding and agreed to proceed.   Diagnosis: Bipolar I disorder, most recent episode depressed (HCC) [F31.30]    1. Bipolar I disorder, most recent episode depressed (HCC)   2. Prolonged grief disorder       Donia Guiles, LCSW

## 2023-01-26 NOTE — Psych (Signed)
Virtual Visit via Video Note  I connected with Stacey Weaver on 12/30/22 at  9:00 AM EDT by a video enabled telemedicine application and verified that I am speaking with the correct person using two identifiers.  Location: Patient: patient home Provider: clinical home office   I discussed the limitations of evaluation and management by telemedicine and the availability of in person appointments. The patient expressed understanding and agreed to proceed.  I discussed the assessment and treatment plan with the patient. The patient was provided an opportunity to ask questions and all were answered. The patient agreed with the plan and demonstrated an understanding of the instructions.   The patient was advised to call back or seek an in-person evaluation if the symptoms worsen or if the condition fails to improve as anticipated.  Pt was provided 240 minutes of non-face-to-face time during this encounter.   Donia Guiles, LCSW   [facility] PHP THERAPIST PROGRESS NOTE  Stacey Weaver [id number]  Session Time: 9:00 - 10:00  Participation Level: Active  Behavioral Response: CasualAlertAnxious and Depressed  Type of Therapy: Group Therapy  Treatment Goals addressed: Coping  Progress Towards Goals: Progressing  Interventions: CBT, DBT, Supportive, and Reframing  Summary: Stacey Weaver is a 47 y.o. female who presents with mood symptoms and grief.  Clinician led check-in regarding current stressors and situation, and review of patient completed daily inventory. Clinician utilized active listening and empathetic response and validated patient emotions. Clinician facilitated processing group on pertinent issues.?    Therapist Response:  Patient arrived within time allowed. Patient rates her mood at a -2 on a scale of 1-10 with 10 being best. Pt states she feels "over it." Pt states she slept 1.5 hours and ate 0x. Pt states that when she went to pick up her prescription last  night it was not at the pharmacy yet and that news led to a panic attack with difficulty breathing vomiting, and anger. Pt reports this reinforced the feeling that "no one can help me." Pt states her aunt came over and stayed with her and her family urged her to go to the hospital but patient resisted. Pt shares resisting the hospital because last time she was there, her mom visited her daily and pt is not ready to face the reality of that not happening. Pt reports she took time off work through Monday to give her some relief and time to focus on herself. Patient able to process. Patient engaged in discussion. Pt again demonstrates vulnerability and effort in processing and is notably de-escalated by end of session. Pt denies SI/HI.  Cln confirmed in pt's chart that pharmacy had received the prescription and that it had been sent after pt went to the pharmacy. Cln informed pt.          Session Time: 10:00 am - 11:00 am   Participation Level: Active   Behavioral Response: CasualAlertDepressed   Type of Therapy: Group Therapy   Treatment Goals addressed: Coping   Progress Towards Goals: Progressing   Interventions: CBT, DBT, Solution Focused, Strength-based, Supportive, and Reframing   Summary: Cln led discussion on control and the way it impacts our lives. Group members shared struggles and worked to identify the way in which control is contributing to the struggle. Cln utilized CBT thought challenging and the Catch-Challenge-Change model to address their control issues.    Therapist Response:   Pt engaged in discussion and is able to determine ways in which control is an issue for  them and brainstorm how to address it in a healthy manner.          Session Time: 11:00 -12:00   Participation Level: Active   Behavioral Response: CasualAlertDepressed   Type of Therapy: Group Therapy   Treatment Goals addressed: Coping   Progress Towards Goals: Progressing   Interventions: CBT, DBT,  Solution Focused, Strength-based, Supportive, and Reframing   Summary: Cln continued topic of DBT distress tolerance skills and the ACCEPTS distraction skill. Group reviewed E-T skills and discussed how they can practice them in their every day life.    Therapist Response:  Pt engaged in discussion and determines ways to practice each skill.          Session Time: 12:00 -1:00   Participation Level: Active   Behavioral Response: CasualAlertDepressed   Type of Therapy: Group therapy   Treatment Goals addressed: Coping   Progress Towards Goals: Progressing   Interventions: OT group   Summary: 12:00 - 12:50: Occupational Therapy group with cln E. Hollan.  12:50 - 1:00 Clinician assessed for immediate needs, medication compliance and efficacy, and safety concerns.   Therapist Response: 12:00 - 12:50: See note 12:50 - 1:00 pm: At check-out, patient reports no immediate concerns. Patient demonstrates progress as evidenced by continued engagement and responsiveness to treatment. Patient denies SI/HI/self-harm thoughts at the end of group.     Suicidal/Homicidal: Nowithout intent/plan  Plan: Pt will continue in PHP while working to decrease depression/anxiety/grief symptoms, increase emotion regulation, and increase ability to manage symptoms in a healthy manner.   Collaboration of Care: Medication Management AEB T Lewis  Patient/Guardian was advised Release of Information must be obtained prior to any record release in order to collaborate their care with an outside provider. Patient/Guardian was advised if they have not already done so to contact the registration department to sign all necessary forms in order for [place] to release information regarding their care.   Consent: Patient/Guardian gives verbal consent for treatment and assignment of benefits for services provided during this visit. Patient/Guardian expressed understanding and agreed to proceed.   Diagnosis: Bipolar I  disorder, most recent episode depressed (HCC) [F31.30]    1. Bipolar I disorder, most recent episode depressed (HCC)   2. Prolonged grief disorder       Donia Guiles, LCSW

## 2023-01-26 NOTE — Psych (Signed)
Virtual Visit via Video Note  I connected with Stacey Weaver on 01/03/23 at  9:00 AM EDT by a video enabled telemedicine application and verified that I am speaking with the correct person using two identifiers.  Location: Patient: patient home Provider: clinical home office   I discussed the limitations of evaluation and management by telemedicine and the availability of in person appointments. The patient expressed understanding and agreed to proceed.  I discussed the assessment and treatment plan with the patient. The patient was provided an opportunity to ask questions and all were answered. The patient agreed with the plan and demonstrated an understanding of the instructions.   The patient was advised to call back or seek an in-person evaluation if the symptoms worsen or if the condition fails to improve as anticipated.  Pt was provided 240 minutes of non-face-to-face time during this encounter.   Stacey Guiles, LCSW   [facility] PHP THERAPIST PROGRESS NOTE  Stacey Weaver [id number]  Session Time: 9:00 - 10:00  Participation Level: Active  Behavioral Response: CasualAlertAnxious and Depressed  Type of Therapy: Group Therapy  Treatment Goals addressed: Coping  Progress Towards Goals: Progressing  Interventions: CBT, DBT, Supportive, and Reframing  Summary: Stacey Weaver is a 46 y.o. female who presents with mood symptoms and grief.  Clinician led check-in regarding current stressors and situation, and review of patient completed daily inventory. Clinician utilized active listening and empathetic response and validated patient emotions. Clinician facilitated processing group on pertinent issues.?    Therapist Response:  Patient arrived within time allowed. Patient rates her mood at a 6 on a scale of 1-10 with 10 being best. Pt states she feels "over it." Pt states she slept 5 hours solid and up/down for 2-3 more hours and ate 2x. Pt reports she is relieved  to be sleeping and feels better already. Pt shares she went in to work over the weekend despite having taken it off due to their being under-staffed. Pt struggles with boundaries. Patient able to process. Patient engaged in discussion.          Session Time: 10:00 am - 11:00 am   Participation Level: Active   Behavioral Response: CasualAlertDepressed   Type of Therapy: Group Therapy   Treatment Goals addressed: Coping   Progress Towards Goals: Progressing   Interventions: CBT, DBT, Solution Focused, Strength-based, Supportive, and Reframing   Summary: Cln led processing group for pt's current struggles. Group members shared stressors and provided support and feedback. Cln brought in topics of boundaries, healthy relationships, and unhealthy thought processes to inform discussion.    Therapist Response:  Pt able to process and provide support to group.         Session Time: 11:00 -12:00   Participation Level: Active   Behavioral Response: CasualAlertDepressed   Type of Therapy: Group Therapy   Treatment Goals addressed: Coping   Progress Towards Goals: Progressing   Interventions: CBT, DBT, Solution Focused, Strength-based, Supportive, and Reframing   Summary: Cln led discussion on coping with loneliness. Group members shared common reactions to loneliness and how it helps/hinders them. Cln contextualized loneliness and encouraged pt's to manage loneliness as they would any other feeling. Group brainstormed ways to manage.    Therapist Response:  Pt engaged in discussion and reports increased insight.          Session Time: 12:00 -1:00   Participation Level: Active   Behavioral Response: CasualAlertDepressed   Type of Therapy: Group therapy   Treatment Goals  addressed: Coping   Progress Towards Goals: Progressing   Interventions: OT group   Summary: 12:00 - 12:50: Occupational Therapy group with cln E. Hollan.  12:50 - 1:00 Clinician assessed for  immediate needs, medication compliance and efficacy, and safety concerns.   Therapist Response: 12:00 - 12:50: See note 12:50 - 1:00 pm: At check-out, patient reports no immediate concerns. Patient demonstrates progress as evidenced by continued engagement and responsiveness to treatment. Patient denies SI/HI/self-harm thoughts at the end of group.     Suicidal/Homicidal: Nowithout intent/plan  Plan: Pt will continue in PHP while working to decrease depression/anxiety/grief symptoms, increase emotion regulation, and increase ability to manage symptoms in a healthy manner.   Collaboration of Care: Medication Management AEB C Cosby  Patient/Guardian was advised Release of Information must be obtained prior to any record release in order to collaborate their care with an outside provider. Patient/Guardian was advised if they have not already done so to contact the registration department to sign all necessary forms in order for [place] to release information regarding their care.   Consent: Patient/Guardian gives verbal consent for treatment and assignment of benefits for services provided during this visit. Patient/Guardian expressed understanding and agreed to proceed.   Diagnosis: Bipolar I disorder, most recent episode depressed (HCC) [F31.30]    1. Bipolar I disorder, most recent episode depressed (HCC)   2. Difficulty coping   3. Prolonged grief disorder   4. Other insomnia       Stacey Guiles, LCSW

## 2023-01-26 NOTE — Psych (Signed)
Virtual Visit via Video Note  I connected with Stacey Weaver on 12/29/22 at  9:00 AM EDT by a video enabled telemedicine application and verified that I am speaking with the correct person using two identifiers.  Location: Patient: patient home Provider: clinical home office   I discussed the limitations of evaluation and management by telemedicine and the availability of in person appointments. The patient expressed understanding and agreed to proceed.  I discussed the assessment and treatment plan with the patient. The patient was provided an opportunity to ask questions and all were answered. The patient agreed with the plan and demonstrated an understanding of the instructions.   The patient was advised to call back or seek an in-person evaluation if the symptoms worsen or if the condition fails to improve as anticipated.  Pt was provided 240 minutes of non-face-to-face time during this encounter.   Donia Guiles, LCSW   [facility] PHP THERAPIST PROGRESS NOTE  Stacey Weaver [id number]  Session Time: 9:00 - 10:00  Participation Level: Active  Behavioral Response: CasualAlertAnxious and Depressed  Type of Therapy: Group Therapy  Treatment Goals addressed: Coping  Progress Towards Goals: Progressing  Interventions: CBT, DBT, Supportive, and Reframing  Summary: Stacey Weaver is a 47 y.o. female who presents with mood symptoms and grief.  Clinician led check-in regarding current stressors and situation, and review of patient completed daily inventory. Clinician utilized active listening and empathetic response and validated patient emotions. Clinician facilitated processing group on pertinent issues.?    Therapist Response:  Patient arrived within time allowed. Patient rates her mood at a 1 on a scale of 1-10 with 10 being best. Pt states she feels "over it." Pt states she slept 2 hours and ate 1x. Pt states work continues to be difficult due to emotional weight  and exhaustion. Pt states she did chores and texted people this morning since she has been up since 3am. Pt identifies feeling emotionally stronger "a little" today. Pt identifies passive SI and denies plan/intent, reporting continued protective factor of promising her mother that she would take care of her brother. Patient able to process. Patient engaged in discussion.          Session Time: 10:00 am - 11:00 am   Participation Level: Active   Behavioral Response: CasualAlertDepressed   Type of Therapy: Group Therapy   Treatment Goals addressed: Coping   Progress Towards Goals: Progressing   Interventions: CBT, DBT, Solution Focused, Strength-based, Supportive, and Reframing   Therapist Response: Cln led processing group for pt's current struggles. Group members shared stressors and provided support and feedback. Cln brought in topics of boundaries, healthy relationships, and unhealthy thought processes to inform discussion.    Therapist Response: Pt able to process and provide feedback to group.         Session Time: 11:00 -12:00   Participation Level: Active   Behavioral Response: CasualAlertDepressed   Type of Therapy: Group Therapy   Treatment Goals addressed: Coping   Progress Towards Goals: Progressing   Interventions: Strength-based, Supportive, and Reframing   Summary: Chaplaincy group with K. Claussen   Therapist Response: Pt participated and engaged in discussion.          Session Time: 12:00 -1:00   Participation Level: Active   Behavioral Response: CasualAlertDepressed   Type of Therapy: Group therapy   Treatment Goals addressed: Coping   Progress Towards Goals: Progressing   Interventions: OT group   Summary: 12:00 - 12:50: Occupational Therapy group with cln  E. Hollan.  12:50 - 1:00 Clinician assessed for immediate needs, medication compliance and efficacy, and safety concerns.   Therapist Response: 12:00 - 12:50: See note 12:50 - 1:00  pm: At check-out, patient reports no immediate concerns. Patient demonstrates progress as evidenced by continued engagement and responsiveness to treatment. Patient denies SI/HI/self-harm thoughts at the end of group.     Suicidal/Homicidal: Nowithout intent/plan  Plan: Pt will continue in PHP while working to decrease depression/anxiety/grief symptoms, increase emotion regulation, and increase ability to manage symptoms in a healthy manner.   Collaboration of Care: Medication Management AEB T Lewis  Patient/Guardian was advised Release of Information must be obtained prior to any record release in order to collaborate their care with an outside provider. Patient/Guardian was advised if they have not already done so to contact the registration department to sign all necessary forms in order for [place] to release information regarding their care.   Consent: Patient/Guardian gives verbal consent for treatment and assignment of benefits for services provided during this visit. Patient/Guardian expressed understanding and agreed to proceed.   Diagnosis: Bipolar I disorder, most recent episode depressed (HCC) [F31.30]    1. Bipolar I disorder, most recent episode depressed (HCC)   2. Prolonged grief disorder   3. Difficulty coping   4. Other insomnia       Donia Guiles, LCSW

## 2023-01-27 NOTE — Addendum Note (Signed)
Addended by: Everlena Cooper on: 01/27/2023 01:00 PM   Modules accepted: Level of Service

## 2023-01-27 NOTE — Psych (Signed)
Virtual Visit via Video Note  I connected with Stacey Weaver on 01/06/23 at  9:00 AM EDT by a video enabled telemedicine application and verified that I am speaking with the correct person using two identifiers.  Location: Patient: patient home Provider: clinical home office   I discussed the limitations of evaluation and management by telemedicine and the availability of in person appointments. The patient expressed understanding and agreed to proceed.  I discussed the assessment and treatment plan with the patient. The patient was provided an opportunity to ask questions and all were answered. The patient agreed with the plan and demonstrated an understanding of the instructions.   The patient was advised to call back or seek an in-person evaluation if the symptoms worsen or if the condition fails to improve as anticipated.  Pt was provided 240 minutes of non-face-to-face time during this encounter.   Stacey Guiles, LCSW   [facility] PHP THERAPIST PROGRESS NOTE  Stacey Weaver [id number]  Session Time: 9:00 - 10:00  Participation Level: Active  Behavioral Response: CasualAlertAnxious and Depressed  Type of Therapy: Group Therapy  Treatment Goals addressed: Coping  Progress Towards Goals: Progressing  Interventions: CBT, DBT, Supportive, and Reframing  Summary: Stacey Weaver is a 47 y.o. female who presents with mood symptoms and grief.  Clinician led check-in regarding current stressors and situation, and review of patient completed daily inventory. Clinician utilized active listening and empathetic response and validated patient emotions. Clinician facilitated processing group on pertinent issues.?    Therapist Response:  Patient arrived within time allowed. Patient rates her mood at a 3 on a scale of 1-10 with 10 being best. Pt states she feels "okay." Pt states she slept 4 hours with and ate 1x. Pt reports her sleep was interrupted by a dream/memory of the  last drive with her mom. Pt reports she felt anxious/panicky and was able to feel the grief and not let it consume her. Pt reports not being discouraged by less sleep because she understands what got in the way. Pt shares she was able to complete care tasks yesterday. Patient able to process. Patient engaged in discussion.          Session Time: 10:00 am - 11:00 am   Participation Level: Active   Behavioral Response: CasualAlertDepressed   Type of Therapy: Group Therapy   Treatment Goals addressed: Coping   Progress Towards Goals: Progressing   Interventions: CBT, DBT, Solution Focused, Strength-based, Supportive, and Reframing   Therapist Response: Cln led discussion on accountability and the balance between taking responsibility and not beating ourselves up. Group members shared current consequences they are dealing with and how they are processing them. Cln encouraged pt's to utilize the DBT dialectics when processing.   Therapist Response: Pt engaged in discussion and is able to process.       Session Time: 11:00 -12:00   Participation Level: Active   Behavioral Response: CasualAlertDepressed   Type of Therapy: Group Therapy   Treatment Goals addressed: Coping   Progress Towards Goals: Progressing   Interventions: CBT, DBT, Solution Focused, Strength-based, Supportive, and Reframing   Summary: Cln led discussion on personal standards and they way in which it impacts the way we view ourselves and our abilities. Group members discussed judgment, struggles, and barriers they experience in terms of personal standards. Cln brought in topics of balance, grace, and kindness. Cln proposed the "best friend test" as a way to calibrate whether we are viewing our situation with kindness or  harshness.    Therapist Response: Pt engaged in discussion and reports willingness to utilize the best friend test.          Session Time: 12:00 -1:00   Participation Level: Active    Behavioral Response: CasualAlertDepressed   Type of Therapy: Group therapy   Treatment Goals addressed: Coping   Progress Towards Goals: Progressing   Interventions: CBT, DBT, Solution Focused, Strength-based, Supportive, and Reframing   Summary: 12:00 - 12:50: Cln led discussion on unknowns and the way they impact our anxiety. Cln discussed cognitive restructuring, DBT distraction skills, and positive mantras/prayer as ways to address the anxiety. Cln discussed idea "I can do hard things" and ways to bolster confidence in our ability to handle difficult situations.  12:50 - 1:00 Clinician assessed for immediate needs, medication compliance and efficacy, and safety concerns.   Therapist Response: 12:00 - 12:50: Pt shared current unknowns they are dealing with and is able to identify ways to manage.  12:50 - 1:00 pm: At check-out, patient reports no immediate concerns. Patient demonstrates progress as evidenced by continued engagement and responsiveness to treatment. Patient denies SI/HI/self-harm thoughts at the end of group.     Suicidal/Homicidal: Nowithout intent/plan  Plan: Pt will continue in PHP while working to decrease depression/anxiety/grief symptoms, increase emotion regulation, and increase ability to manage symptoms in a healthy manner.   Collaboration of Care: Medication Management AEB C Cosby  Patient/Guardian was advised Release of Information must be obtained prior to any record release in order to collaborate their care with an outside provider. Patient/Guardian was advised if they have not already done so to contact the registration department to sign all necessary forms in order for [place] to release information regarding their care.   Consent: Patient/Guardian gives verbal consent for treatment and assignment of benefits for services provided during this visit. Patient/Guardian expressed understanding and agreed to proceed.   Diagnosis: Bipolar I disorder, most recent  episode depressed (HCC) [F31.30]    1. Bipolar I disorder, most recent episode depressed (HCC)   2. Prolonged grief disorder       Stacey Guiles, LCSW

## 2023-01-27 NOTE — Psych (Signed)
Virtual Visit via Video Note  I connected with Stacey Weaver Co on 01/07/23 at  9:00 AM EDT by a video enabled telemedicine application and verified that I am speaking with the correct person using two identifiers.  Location: Patient: patient home Provider: clinical home office   I discussed the limitations of evaluation and management by telemedicine and the availability of in person appointments. The patient expressed understanding and agreed to proceed.  I discussed the assessment and treatment plan with the patient. The patient was provided an opportunity to ask questions and all were answered. The patient agreed with the plan and demonstrated an understanding of the instructions.   The patient was advised to call back or seek an in-person evaluation if the symptoms worsen or if the condition fails to improve as anticipated.  Pt was provided 240 minutes of non-face-to-face time during this encounter.   Donia Guiles, LCSW   [facility] PHP THERAPIST PROGRESS NOTE  Stacey Weaver [id number]  Session Time: 9:00 - 10:00  Participation Level: Active  Behavioral Response: CasualAlertAnxious and Depressed  Type of Therapy: Group Therapy  Treatment Goals addressed: Coping  Progress Towards Goals: Progressing  Interventions: CBT, DBT, Supportive, and Reframing  Summary: Stacey Weaver is a 47 y.o. female who presents with mood symptoms and grief.  Clinician led check-in regarding current stressors and situation, and review of patient completed daily inventory. Clinician utilized active listening and empathetic response and validated patient emotions. Clinician facilitated processing group on pertinent issues.?    Therapist Response:  Patient arrived within time allowed. Patient rates her mood at a 6 on a scale of 1-10 with 10 being best. Pt states she feels "accomplished." Pt states she slept 3 hours with no nightmares and ate 1x. Pt reports she had an "intense" day and  identified she was "managing it better than I would have in the past." Pt states she has never stuck with a treatment plan before and feels "so proud" to have completed PHP. Pt reports some anxiety regarding discharge and sticking with her outpatient treatment however states she feels determined and optimistic.   Patient able to process. Patient engaged in discussion.          Session Time: 10:00 am - 11:00 am   Participation Level: Active   Behavioral Response: CasualAlertDepressed   Type of Therapy: Group Therapy   Treatment Goals addressed: Coping   Progress Towards Goals: Progressing   Interventions: CBT, DBT, Solution Focused, Strength-based, Supportive, and Reframing   Therapist Response: Cln introduced DBT emotion regulation skill, PLEASE. Cln discussed the importance of taking care of our whole body as a way to aid in stabilizing emotions. Group discussed ways they struggle to manage the elements of PLEASE and how to remove barriers.    Therapist Response:  Pt engaged in discussion and is able to identify ways to utilize PLEASE skill.           Session Time: 11:00 -12:00   Participation Level: Active   Behavioral Response: CasualAlertDepressed   Type of Therapy: Group Therapy   Treatment Goals addressed: Coping   Progress Towards Goals: Progressing   Interventions: CBT, DBT, Solution Focused, Strength-based, Supportive, and Reframing   Summary: Cln led discussion on ways to manage stressors and feelings over the weekend. Group members  brainstormed things to do over the weekend for multiple levels of energy, access, and moods. Cln reviewed crisis services should they be needed and provided pt's with the text crisis line, mobile crisis,  national suicide hotline, [facility] 24/7 line, and information on [facility].      Therapist Response: Pt engaged in discussion and is able to identify 3 ideas of what to do over the weekend to keep their mind engaged.           Session Time: 12:00 -1:00   Participation Level: Active   Behavioral Response: CasualAlertDepressed   Type of Therapy: Group therapy   Treatment Goals addressed: Coping   Progress Towards Goals: Progressing   Interventions: OT group   Summary: 12:00 - 12:50: Occupational Therapy group with cln E. Hollan.  12:50 - 1:00 Clinician assessed for immediate needs, medication compliance and efficacy, and safety concerns.   Therapist Response: 12:00 - 12:50: See note 12:50 - 1:00 pm: At check-out, patient reports no immediate concerns. Patient demonstrates progress as evidenced by continued engagement and responsiveness to treatment. Patient denies SI/HI/self-harm thoughts at the end of group.     Suicidal/Homicidal: Nowithout intent/plan  Plan: Pt will discharge from PHP due to meeting treatment goals of decreased depression/anxiety/grief symptoms, increased emotion regulation, and increased ability to manage symptoms in a healthy manner. Pt will step down to outpatient therapy/psychiatry within this agency. Pt and provider are aligned with discharge plan. Pt denies SI/HI at time of discharge.   Collaboration of Care: Medication Management AEB C Cosby  Patient/Guardian was advised Release of Information must be obtained prior to any record release in order to collaborate their care with an outside provider. Patient/Guardian was advised if they have not already done so to contact the registration department to sign all necessary forms in order for [place] to release information regarding their care.   Consent: Patient/Guardian gives verbal consent for treatment and assignment of benefits for services provided during this visit. Patient/Guardian expressed understanding and agreed to proceed.   Diagnosis: Bipolar I disorder, most recent episode depressed (HCC) [F31.30]    1. Bipolar I disorder, most recent episode depressed (HCC)   2. Prolonged grief disorder       Donia Guiles,  LCSW

## 2023-02-09 ENCOUNTER — Ambulatory Visit (HOSPITAL_COMMUNITY): Payer: No Typology Code available for payment source | Admitting: Student

## 2023-02-09 DIAGNOSIS — F4381 Prolonged grief disorder: Secondary | ICD-10-CM

## 2023-02-09 DIAGNOSIS — R4589 Other symptoms and signs involving emotional state: Secondary | ICD-10-CM | POA: Diagnosis not present

## 2023-02-09 DIAGNOSIS — F3130 Bipolar disorder, current episode depressed, mild or moderate severity, unspecified: Secondary | ICD-10-CM

## 2023-02-09 DIAGNOSIS — G4709 Other insomnia: Secondary | ICD-10-CM

## 2023-02-13 MED ORDER — ZOLPIDEM TARTRATE ER 12.5 MG PO TBCR: 12.5000 mg | EXTENDED_RELEASE_TABLET | Freq: Every evening | ORAL | 0 refills | Status: DC | PRN

## 2023-02-13 NOTE — Progress Notes (Signed)
Psychiatric Initial Adult Assessment  Patient Identification: Stacey Weaver MRN:  [id number] Date of Evaluation:  02/09/2023  Referral Source: PHP  Assessment:  Stacey Weaver is a 47 y.o. female with a history of bipolar 1 disorder, difficulty coping, grief, and recent completion of PHP who presents in person to [facility] to establish care.  Patient reports that since completing PHP, she has had variable sleep and she has been putting on a facade to get through work but she is overall not doing so well. She has been experiencing grief in waves and feeling anxious. She endorses passive SI regularly, but her brother is a protective factor; she insists she would not harm herself so that he does not lose another loved one.   Patient has been mostly depressed and anxious since the loss of her mother and grandmother.  As discussed with him PHP, she experienced symptoms similar to PTSD with those losses.  Patient denies another manic episode since inpatient psychiatric hospitalization in July 2017, at which time she was discharged on Tegretol, Lamictal Xanax 0.25 mg twice daily as needed, and hydroxyzine and trazodone as needed.  She reports that the Tegretol was a key component for mood stabilization in her regimen.  These dosages have been titrated over time to where she is currently taking a daily total of 400 mg of Tegretol and 100 mg of Lamictal.  As well, she has had a number of additional medications initiated for stabilization including prazosin, Klonopin, Zoloft, and Ambien.  Patient has not yet started individual therapy since discharging from the partial hospitalization program in October of this year.  As patient gets further connected, would like to decrease her medication burden.  As patient has achieved stability with the combination of Lamictal, Zoloft, and Tegretol since 2017, she may be resistant to making changes, but these will be things that we work on  over the next few months.  She is sleeping poorly but taking phentermine, to which she is resistant to discontinuing at this time.  Due to her SI and history of overdoses on prescribed medications, will be more conservative with prescribing overall and will work to enact some de-prescribing sooner rather than later.  Overall, patient is able to keep herself safe and is able to verbalize a safety plan should her SI become active or worsen in any capacity.  Risk Assessment: A suicide and violence risk assessment was performed as part of this evaluation. There patient is deemed to be at chronic elevated risk for self-harm/suicide given the following factors: suicidal ideation or threats without a plan, feelings of hopelessness, sense of isolation, history of depression, recent loss, and recent bereavement. These risk factors are mitigated by the following factors: no known access to weapons or firearms, no history of previous suicide attempts, no history of violence, motivation for treatment, utilization of positive coping skills, supportive family, sense of responsibility to family and social supports, presence of an available support system, expresses purpose for living, current treatment compliance, effective problem solving skills, and presence of a safety plan with follow-up care. The patient is deemed to be at chronic elevated risk for violence given the following factors: recent loss. These risk factors are mitigated by the following factors: no known history of violence towards others, no known violence towards others in the last 6 months, no known history of threats of harm towards others, no known homicidal ideation in the last 6 months, no command hallucinations to harm others in the last  6 months, no active symptoms of psychosis, no active symptoms of mania, low impulsivity, intolerant attitude toward deviance, positive social orientation, and connectedness to family. There is no acute risk for  suicide or violence at this time. The patient was educated about relevant modifiable risk factors including following recommendations for treatment of psychiatric illness and abstaining from substance abuse.  While future psychiatric events cannot be accurately predicted, the patient does not currently require  acute inpatient psychiatric care and does not currently meet [facility] involuntary commitment criteria.    Plan:  # Bipolar 1 Disorder Past medication trials:  Status of problem: Current episode depressed Interventions: -- Continue Tegretol 200 mg twice daily for mood stabilization -- Continue Lamictal 100 mg daily for mood stabilization; will plan to decrease dosage during next visit. -- Continue Zoloft 150 mg daily for depressive symptoms   # Difficulty coping #Other insomnia Past medication trials:  Status of problem: Ongoing Interventions: -- Continue prazosin 1 mg nightly for nightmares --Continue Ambien CR 12.5 mg nightly as needed for sleep -- Will continue trazodone prescription directions as prescribed (300 mg nightly as needed) but advised patient to not take trazodone unless she is unable to fall asleep after an hour of taking prazosin and Ambien. -- Patient to begin therapy with Marcell Barlow, LCSW on 12/5  # Weight loss Past medication trials:  Status of problem: Managed by PCP Interventions: -- Patient currently taking phentermine 15 mg daily; advised patient to discuss with PCP discontinuing for now until her sleep improves, but patient is currently resistant to this idea  Return to care in 4 to 6 weeks  Patient was given contact information for behavioral health clinic and was instructed to call 911 for emergencies.    Patient and plan of care will be discussed with the Attending MD ,Dr. Mercy Riding, who agrees with the above statement and plan.   Subjective:  Chief Complaint:  Chief Complaint  Patient presents with   Establish Care   Depression    Anxiety    History of Present Illness: Patient presents today for medication management follow-up after seeing this writer during PHP.  She reports that she has had increasing difficulty with her depression and anxiety, particularly in the setting of grief after the program completed.  She has returned to work and has found that she is putting on a faade and becoming exhausted from always presenting as happy.  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  She has been spending time with her brother and other friends, who are her primary support system but ultimately feels alone and as though she is not well understood.  She has had an increase in SI, stating that she wants to be with her mother and grandmother.  However she states that she would not act on these thoughts because she does not want to put her brother through dealing with another loss.  He is what keeps her going.  At this time, she is unable to identify her personal motivation for living.  As well, she has difficulty with understanding who she is, particularly in the absence of her mom and grandmother.  She denies HI, AVH, and paranoia.  She reports no subsequent manic episodes since hospitalization in 2017 and Tegretol initiated.  She has had difficulty sleeping since the death of her mother and grandmother, but she has not experienced decreased need for sleep with elevated mood, pressured speech,  nor an increase in impulsivity.  Past Psychiatric History:  Diagnoses: Bipolar 1 disorder, Medication trials: Haldol, Abilify (previously discontinued due to cost), Klonopin, Lamictal, Wellbutrin, Prozac, Cymbalta (experienced muscle spasms and suicidal thoughts), Geodon and Zyprexa (experienced hallucinations), Navane Previous psychiatrist/therapist: [facility] with Dr. Clyde Canterbury as her outpatient psychiatrist Hospitalizations:  [facility] in 2009, multiple times in 2010, 2012, 2017 Suicide attempts: 2012-overdose of Wellbutrin 150 mg; overdose in 2013 on Wellbutrin and Benadryl SIB: Denies Hx of violence towards others: Denies Current access to guns: Denies Hx of trauma/abuse: Denies  Substance Abuse History in the last 12 months:  No.  Past Medical History:  Past Medical History:  Diagnosis Date   Anxiety    Asthma    Bipolar 1 disorder (HCC)    Migraine     Past Surgical History:  Procedure Laterality Date   KNEE SURGERY      Family Psychiatric History:  Family History:  Family History  Problem Relation Age of Onset   Hyperlipidemia Mother    Hypertension Mother    Heart disease Father 18       CAD   Hyperlipidemia Maternal Grandmother    Cancer Maternal Grandmother    Cancer Maternal Grandfather    Hyperlipidemia Brother     Social History:   Academic/Vocational:  Social History   Socioeconomic History   Marital status: Single    Spouse name: Not on file   Number of children: Not on file   Years of education: Not on file   Highest education level: Not on file  Occupational History   Not on file  Tobacco Use   Smoking status: Never   Smokeless tobacco: Never  Substance and Sexual Activity   Alcohol use: No   Drug use: No   Sexual activity: Yes    Birth control/protection: I.U.D.  Other Topics Concern   Not on file  Social History Narrative   Not on file   Social Determinants of Health   Financial Resource Strain: Not on file  Food Insecurity: Medium Risk (11/04/2022)   Received from [facility]   Hunger Vital Sign    Worried About Running Out of Food in the Last Year: Sometimes true    Ran Out of Food in the Last Year: Sometimes true  Transportation Needs: Not on file (11/04/2022)  Physical Activity: Not on file  Stress: Not on file  Social Connections: Unknown (08/04/2021)   Received from [facility]   Social Network    Social  Network: Not on file    Additional Social History: updated  Allergies:   Allergies  Allergen Reactions   Almotriptan Malate Shortness Of Breath and Other (See Comments)    Muscle spasms   Cymbalta [Duloxetine Hcl] Other (See Comments)    Other reaction(s): Other Muscle spasms suicidal thoughts   Imitrex [Sumatriptan] Shortness Of Breath, Other (See Comments) and Palpitations    Muscle spasms   Prednisone Other (See Comments) and Palpitations   Zomig Shortness Of Breath   Geodon [Ziprasidone Hcl] Other (See Comments)    hallucinations   Lactose Intolerance (Gi) Diarrhea and Other (See Comments)    Gas and bloated    Penicillins Other (See Comments)    GI upset  Has patient had a PCN reaction causing immediate rash, facial/tongue/throat swelling, SOB or lightheadedness with hypotension: No Has patient had a PCN reaction causing severe rash involving mucus membranes or skin necrosis: No Has patient had a  PCN reaction that required hospitalization No Has patient had a PCN reaction occurring within the last 10 years: Yes If all of the above answers are "NO", then may proceed with Cephalosporin use.    Zyprexa [Olanzapine] Other (See Comments)    Hallucinations     Current Medications: Current Outpatient Medications  Medication Sig Dispense Refill   atorvastatin (LIPITOR) 10 MG tablet Take 10 mg by mouth daily.     carbamazepine (TEGRETOL XR) 200 MG 12 hr tablet Take 1 tablet (200 mg total) by mouth 2 (two) times daily. 60 tablet 1   clonazePAM (KLONOPIN) 0.5 MG tablet Take by mouth.     ibuprofen (ADVIL) 800 MG tablet Take 1 tablet (800 mg total) by mouth 3 (three) times daily. 21 tablet 0   lamoTRIgine (LAMICTAL) 100 MG tablet Take 1 tablet (100 mg total) by mouth daily. 30 tablet 1   phentermine 15 MG capsule Take 15 mg by mouth daily.     prazosin (MINIPRESS) 1 MG capsule Take 1 capsule (1 mg total) by mouth at bedtime. 30 capsule 0   sertraline (ZOLOFT) 100 MG tablet Take  1.5 tablets (150 mg total) by mouth daily. 45 tablet 1   traZODone (DESYREL) 100 MG tablet Take 3 tablets by mouth at bedtime.     UBRELVY 100 MG TABS Take by mouth.     WEGOVY 0.5 MG/0.5ML SOAJ Inject 0.5 mg into the skin once a week.     zolpidem (AMBIEN CR) 12.5 MG CR tablet Take 1 tablet (12.5 mg total) by mouth at bedtime as needed for sleep. 30 tablet 0   No current facility-administered medications for this visit.    ROS: Review of Systems   Objective:  Psychiatric Specialty Exam: There were no vitals taken for this visit.There is no height or weight on file to calculate BMI.  General Appearance: Well Groomed  Eye Contact:  Good  Speech:  Clear and Coherent and Normal Rate  Volume:  Normal  Mood:  Depressed and Hopeless  Affect:  Full Range  Thought Content: WDL and Logical   Suicidal Thoughts:  Yes.  without intent/plan  Homicidal Thoughts:  No  Thought Process:  Coherent, Goal Directed, and Linear  Orientation:  Full (Time, Place, and Person)    Memory: Immediate;   Good Recent;   Fair Remote;   Fair  Judgment:  Fair  Insight:  Present and Shallow  Concentration:  Concentration: Good and Attention Span: Good  Recall:  not formally assessed   Fund of Knowledge: Good  Language: Good  Psychomotor Activity:  Normal  Akathisia:  No  AIMS (if indicated): not done  Assets:  Communication Skills Desire for Improvement Financial Resources/Insurance Housing Leisure Time Physical Health Resilience Social Support Talents/Skills Transportation Vocational/Educational  ADL's:  Intact  Cognition: WNL  Sleep:  Fair   PE: General: well-appearing; no acute distress  Pulm: no increased work of breathing on room air  Strength & Muscle Tone: within normal limits Neuro: no focal neurological deficits observed  Gait & Station: normal  Metabolic Disorder Labs: No results found for: "HGBA1C", "MPG" No results found for: "PROLACTIN" Lab Results  Component Value Date    CHOL 250 (H) 01/29/2016   TRIG 87.0 01/29/2016   HDL 65.40 01/29/2016   CHOLHDL 4 01/29/2016   VLDL 17.4 01/29/2016   LDLCALC 167 (H) 01/29/2016   Lab Results  Component Value Date   TSH 1.36 06/30/2015    Therapeutic Level Labs: No results found for: "LITHIUM"  Lab Results  Component Value Date   CBMZ 8.7 10/14/2015   Lab Results  Component Value Date   VALPROATE 32.4 (L) 10/14/2009    Screenings:  AIMS    Flowsheet Row [facility]  AIMS Total Score 0      AUDIT    Flowsheet Row [facility]  Alcohol Use Disorder Identification Test Final Score (AUDIT) 0      PHQ2-9    Flowsheet Row Counselor from 12/09/2022 in BEHAV[facility]AL [facility]ALTH PARTIAL HOSPITALIZATION PROGRAM Office Visit from 02/12/2016 in Primary Care at [facility] Visit from 06/30/2015 in Primary Care at [facility] Visit from 12/02/2014 in Primary Care at [facility] Visit from 09/18/2014 in Primary Care at [facility] Total Score 6 0 2 0 0  PHQ-9 Total Score 20 -- 19 -- --      Flowsheet Row ED from 01/14/2023 in [facility] Emergency Department at [facility] Counselor from 12/09/2022 in BEHAVIORAL HEALTH PARTIAL HOSPITALIZATION PROGRAM ED from 12/06/2022 in [facility]  C-SSRS RISK CATEGORY No Risk Low Risk No Risk       Collaboration of Care: Collaboration of Care: Dr. Mercy Riding  Patient/Guardian was advised Release of Information must be obtained prior to any record release in order to collaborate their care with an outside provider. Patient/Guardian was advised if they have not already done so to contact the registration department to sign all necessary forms in order for [place] to release information regarding their care.   Consent: Patient/Guardian gives verbal consent for treatment and assignment of benefits for  services provided during this visit. Patient/Guardian expressed understanding and agreed to proceed.   Lamar Sprinkles, MD 02/09/2023  1:35 PM

## 2023-02-22 ENCOUNTER — Telehealth (HOSPITAL_COMMUNITY): Payer: Self-pay | Admitting: *Deleted

## 2023-02-22 NOTE — Telephone Encounter (Signed)
Pt called to advise that since starting medication after initial visit on 02/09/23 she has had increasing anxiety and depression. Pt states that she is not asking for more medication but wanted to be advised. Pt has a h/o passive SI. Pt next visit is scheduled for 03/09/23. Please review.

## 2023-02-23 ENCOUNTER — Encounter (HOSPITAL_COMMUNITY): Payer: Self-pay | Admitting: Student

## 2023-02-23 ENCOUNTER — Telehealth (HOSPITAL_COMMUNITY): Payer: Self-pay | Admitting: *Deleted

## 2023-02-23 DIAGNOSIS — R4589 Other symptoms and signs involving emotional state: Secondary | ICD-10-CM

## 2023-02-23 DIAGNOSIS — F3130 Bipolar disorder, current episode depressed, mild or moderate severity, unspecified: Secondary | ICD-10-CM

## 2023-02-23 DIAGNOSIS — F4381 Prolonged grief disorder: Secondary | ICD-10-CM

## 2023-02-23 NOTE — Telephone Encounter (Signed)
Pt called a second time regarding increased anxiety and depression. She requests a return call from you please when you get a chance.Thank you!

## 2023-02-25 ENCOUNTER — Encounter (HOSPITAL_COMMUNITY): Payer: Self-pay | Admitting: Student

## 2023-02-28 NOTE — Addendum Note (Signed)
Addended by: Everlena Cooper on: 02/28/2023 09:36 AM   Modules accepted: Level of Service

## 2023-03-01 NOTE — Telephone Encounter (Signed)
Done

## 2023-03-02 MED ORDER — SERTRALINE HCL 100 MG PO TABS: 150.0000 mg | ORAL_TABLET | Freq: Every day | ORAL | 1 refills | Status: DC

## 2023-03-02 MED ORDER — CARBAMAZEPINE ER 200 MG PO TB12: 200.0000 mg | ORAL_TABLET | Freq: Two times a day (BID) | ORAL | 1 refills | Status: DC

## 2023-03-02 MED ORDER — LAMOTRIGINE 100 MG PO TABS: 100.0000 mg | ORAL_TABLET | Freq: Every day | ORAL | 1 refills | Status: DC

## 2023-03-02 MED ORDER — PRAZOSIN HCL 1 MG PO CAPS: 1.0000 mg | ORAL_CAPSULE | Freq: Every day | ORAL | 1 refills | Status: DC

## 2023-03-03 ENCOUNTER — Ambulatory Visit (INDEPENDENT_AMBULATORY_CARE_PROVIDER_SITE_OTHER): Payer: PRIVATE HEALTH INSURANCE | Admitting: Clinical

## 2023-03-03 ENCOUNTER — Encounter (HOSPITAL_COMMUNITY): Payer: Self-pay | Admitting: Clinical

## 2023-03-03 DIAGNOSIS — F4381 Prolonged grief disorder: Secondary | ICD-10-CM

## 2023-03-03 DIAGNOSIS — F3130 Bipolar disorder, current episode depressed, mild or moderate severity, unspecified: Secondary | ICD-10-CM
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
## 2023-03-04 ENCOUNTER — Encounter (HOSPITAL_COMMUNITY): Payer: Self-pay

## 2023-03-09 ENCOUNTER — Ambulatory Visit (HOSPITAL_BASED_OUTPATIENT_CLINIC_OR_DEPARTMENT_OTHER): Payer: No Typology Code available for payment source | Admitting: Student

## 2023-03-09 ENCOUNTER — Other Ambulatory Visit (HOSPITAL_COMMUNITY): Payer: Self-pay

## 2023-03-09 DIAGNOSIS — R4589 Other symptoms and signs involving emotional state: Secondary | ICD-10-CM | POA: Diagnosis not present

## 2023-03-09 DIAGNOSIS — F4381 Prolonged grief disorder: Secondary | ICD-10-CM | POA: Diagnosis not present

## 2023-03-09 DIAGNOSIS — F3130 Bipolar disorder, current episode depressed, mild or moderate severity, unspecified: Secondary | ICD-10-CM

## 2023-03-09 DIAGNOSIS — G4709 Other insomnia: Secondary | ICD-10-CM

## 2023-03-09 NOTE — Patient Instructions (Addendum)
Grief 2 Hope: 7-week Program [email or web address]  [facility]rief [facility] (grief support group) [phone] [facility] (grief support) or visit www.griefshare.org to find local group [phone] [facility] ([facility]rief counseling center) [phone] [facility] (bereavement support group) [phone]

## 2023-03-09 NOTE — Progress Notes (Signed)
BH MD Outpatient Progress Note  03/09/2023 2:32 PM Stacey Weaver  MRN:  [id number]  Assessment:  Stacey Weaver presents for follow-up evaluation in-person. Today, 03/09/23, patient reports significant difficulty as today is the anniversary of her mother's death.  She is taking it particularly hard, and so with her brother for whom she is worried.  Patient reports that she has had worsening of her sleep and her anxiety leading up to today.  We discuss how she should feel all of her feelings and we sometimes have to sit in discomfort.  She voices understanding.  She has begun seeing her therapist, which has proved beneficial thus far.  She would like grief counseling group resources for her brother and her to attend together.  Resources are added to AVS.  Since the passing of her mother and grandmother, patient endorses chronic SI, but she denies active SI at this time.  Her brother remains a protective factor for her.  As well, as previously discussed, patient has given some thought to intrinsic motivation for living as well as some thoughts into what defines her as a person.  As we spent the bulk of today's session discussing her mom, we will discuss her motivation for living at our next visit.  No medication changes to be made today.  Did discuss with patient that during our next visit, we will begin to consolidate her regimen and begin tapering her Lamictal while maximizing the therapy with Tegretol.  She voiced agreement with this plan.  Identifying Information: Stacey Weaver is a 47 y.o. female with a history of bipolar 1 disorder, difficulty coping, grief, and recent completion of PHP  who is an established patient with [facility] for medication management.   Risk Assessment: An assessment of suicide and violence risk factors was performed as part of this evaluation and is not significantly changed from the last visit.             While future  psychiatric events cannot be accurately predicted, the patient does not currently require acute inpatient psychiatric care and does not currently meet [facility] involuntary commitment criteria.          Plan:  # Bipolar 1 Disorder Past medication trials:  Status of problem: Current episode depressed Interventions: -- Continue Tegretol 200 mg twice daily for mood stabilization -- Continue Lamictal 100 mg daily for mood stabilization; will plan to decrease dosage during next visit. -- Continue Zoloft 150 mg daily for depressive symptoms     # Difficulty coping #Other insomnia Past medication trials:  Status of problem: Ongoing Interventions: -- Continue prazosin 1 mg nightly for nightmares --Continue Ambien CR 12.5 mg nightly as needed for sleep -- Will continue trazodone prescription directions as prescribed (300 mg nightly as needed) but advised patient to not take trazodone unless she is unable to fall asleep after an hour of taking prazosin and Ambien. -- Patient to continue therapy with Marcell Barlow, LCSW    # Weight loss Past medication trials:  Status of problem: Managed by PCP Interventions: -- Patient currently taking phentermine 15 mg daily; advised patient to discuss with PCP discontinuing for now until her sleep improves, but patient is currently resistant to this idea    Return to care in 4-6 weeks  Patient was given contact information for behavioral health clinic and was instructed to call 911 for emergencies.    Patient and plan of care will be discussed with the Attending MD ,Dr. Mercy Riding,  who agrees with the above statement and plan.   Subjective:  Chief Complaint:  Chief Complaint  Patient presents with   Follow-up   Depression   Stress    Interval History: Patient reports difficulty approaching the anniversary of her mom's death today. She is tearful throughout assessment. She is worried about her brother's mental health as well.   Anxiety  increased, nightmares worsened, and she is not sleeping well overall. Denies active SI, endorses passive SI.  She reports medication compliance.   She reports beginning therapy, and her initial session went well with [facility].  Patient inquires about group support that she could have with her brother.  We spent time researching grief groups in the area.  Visit Diagnosis:    ICD-10-CM   1. Bipolar I disorder, most recent episode depressed (HCC)  F31.30     2. Prolonged grief disorder  F43.81     3. Difficulty coping  R45.89     4. Other insomnia  G47.09       Past Psychiatric History:  Diagnoses: Bipolar 1 disorder, Medication trials: Haldol, Abilify (previously discontinued due to cost), Klonopin, Lamictal, Wellbutrin, Prozac, Cymbalta (experienced muscle spasms and suicidal thoughts), Geodon and Zyprexa (experienced hallucinations), Navane Previous psychiatrist/therapist: [facility] with Dr. Clyde Canterbury as her outpatient psychiatrist Hospitalizations: [facility] in 2009, multiple times in 2010, 2012, 2017 Suicide attempts: 2012-overdose of Wellbutrin 150 mg; overdose in 2013 on Wellbutrin and Benadryl SIB: Denies Hx of violence towards others: Denies Current access to guns: Denies Hx of trauma/abuse: Denies  Past Medical History:  Past Medical History:  Diagnosis Date   Anxiety    Asthma    Bipolar 1 disorder (HCC)    MDD (major depressive disorder) 10/10/2015   Migraine    Severe episode of recurrent major depressive disorder, without psychotic features (HCC)     Past Surgical History:  Procedure Laterality Date   KNEE SURGERY      Family Psychiatric History:   Family History:  Family History  Problem Relation Age of Onset   Hyperlipidemia Mother    Hypertension Mother    Heart disease Father 101       CAD   Hyperlipidemia Maternal Grandmother    Cancer Maternal Grandmother    Cancer Maternal Grandfather     Hyperlipidemia Brother     Social History:  Academic/Vocational:  Social History   Socioeconomic History   Marital status: Single    Spouse name: Not on file   Number of children: Not on file   Years of education: Not on file   Highest education level: Not on file  Occupational History   Not on file  Tobacco Use   Smoking status: Never   Smokeless tobacco: Never  Substance and Sexual Activity   Alcohol use: No   Drug use: No   Sexual activity: Yes    Birth control/protection: I.U.D.  Other Topics Concern   Not on file  Social History Narrative   Not on file   Social Drivers of Health   Financial Resource Strain: Not on file  Food Insecurity: Medium Risk (11/04/2022)   Received from [facility]   Hunger Vital Sign    Worried About Running Out of Food in the Last Year: Sometimes true    Ran Out of Food in the Last Year: Sometimes true  Transportation Needs: Not on file (11/04/2022)  Physical Activity: Not on file  Stress: Not on file  Social Connections:  Unknown (08/04/2021)   Received from [facility], [facility]   Social Network    Social Network: Not on file    Allergies:  Allergies  Allergen Reactions   Almotriptan Malate Shortness Of Breath and Other (See Comments)    Muscle spasms   Cymbalta [Duloxetine Hcl] Other (See Comments)    Other reaction(s): Other Muscle spasms suicidal thoughts   Imitrex [Sumatriptan] Shortness Of Breath, Other (See Comments) and Palpitations    Muscle spasms   Prednisone Other (See Comments) and Palpitations   Zomig Shortness Of Breath   Geodon [Ziprasidone Hcl] Other (See Comments)    hallucinations   Lactose Intolerance (Gi) Diarrhea and Other (See Comments)    Gas and bloated    Penicillins Other (See Comments)    GI upset  Has patient had a PCN reaction causing immediate rash, facial/tongue/throat swelling, SOB or lightheadedness with hypotension: No Has patient had a PCN reaction causing severe rash involving mucus  membranes or skin necrosis: No Has patient had a PCN reaction that required hospitalization No Has patient had a PCN reaction occurring within the last 10 years: Yes If all of the above answers are "NO", then may proceed with Cephalosporin use.    Zyprexa [Olanzapine] Other (See Comments)    Hallucinations     Current Medications: Current Outpatient Medications  Medication Sig Dispense Refill   atorvastatin (LIPITOR) 10 MG tablet Take 10 mg by mouth daily.     carbamazepine (TEGRETOL XR) 200 MG 12 hr tablet Take 1 tablet (200 mg total) by mouth 2 (two) times daily. 60 tablet 1   clonazePAM (KLONOPIN) 0.5 MG tablet Take by mouth.     ibuprofen (ADVIL) 800 MG tablet Take 1 tablet (800 mg total) by mouth 3 (three) times daily. 21 tablet 0   lamoTRIgine (LAMICTAL) 100 MG tablet Take 1 tablet (100 mg total) by mouth daily. 30 tablet 1   phentermine 15 MG capsule Take 15 mg by mouth daily.     prazosin (MINIPRESS) 1 MG capsule Take 1 capsule (1 mg total) by mouth at bedtime. 30 capsule 1   sertraline (ZOLOFT) 100 MG tablet Take 1.5 tablets (150 mg total) by mouth daily. 45 tablet 1   traZODone (DESYREL) 100 MG tablet Take 3 tablets by mouth at bedtime.     UBRELVY 100 MG TABS Take by mouth.     WEGOVY 0.5 MG/0.5ML SOAJ Inject 0.5 mg into the skin once a week.     [START ON 03/16/2023] zolpidem (AMBIEN CR) 12.5 MG CR tablet Take 1 tablet (12.5 mg total) by mouth at bedtime as needed for sleep. 30 tablet 0   No current facility-administered medications for this visit.    ROS: Review of Systems   Objective:  Psychiatric Specialty Exam: There were no vitals taken for this visit.There is no height or weight on file to calculate BMI.  General Appearance: Casual and Well Groomed  Eye Contact:  Good  Speech:  Clear and Coherent and Normal Rate  Volume:  Normal  Mood:  Depressed and Dysphoric  Affect:  Depressed and Tearful  Thought Content: WDL and Logical   Suicidal Thoughts:  Yes.   without intent/plan  Homicidal Thoughts:  No  Thought Process:  Coherent, Goal Directed, and Linear  Orientation:  Full (Time, Place, and Person)    Memory: Immediate;   Good Recent;   Good Remote;   Fair  Judgment:  Fair  Insight:  Fair and Shallow  Concentration:  Concentration: Good  and Attention Span: Good  Recall: not formally assessed   Fund of Knowledge: Good  Language: Good  Psychomotor Activity:  Normal  Akathisia:  No  AIMS (if indicated): not done  Assets:  Communication Skills Desire for Improvement Financial Resources/Insurance Housing Intimacy Leisure Time Physical Health Resilience Social Support Talents/Skills Transportation Vocational/Educational  ADL's:  Intact  Cognition: WNL  Sleep:  Poor   PE: General: well-appearing; no acute distress  Pulm: no increased work of breathing on room air  Strength & Muscle Tone: within normal limits Neuro: no focal neurological deficits observed  Gait & Station: normal  Metabolic Disorder Labs: No results found for: "HGBA1C", "MPG" No results found for: "PROLACTIN" Lab Results  Component Value Date   CHOL 250 (H) 01/29/2016   TRIG 87.0 01/29/2016   HDL 65.40 01/29/2016   CHOLHDL 4 01/29/2016   VLDL 17.4 01/29/2016   LDLCALC 167 (H) 01/29/2016   Lab Results  Component Value Date   TSH 1.36 06/30/2015   TSH 4.132 08/24/2011    Therapeutic Level Labs: No results found for: "LITHIUM" Lab Results  Component Value Date   VALPROATE 32.4 (L) 10/14/2009   VALPROATE 68.2 10/19/2008   Lab Results  Component Value Date   CBMZ 8.7 10/14/2015    Screenings: AIMS    Flowsheet Row [facility]  AIMS Total Score 0      AUDIT    Flowsheet Row [facility]  Alcohol Use Disorder Identification Test Final Score (AUDIT) 0      GAD-7    Flowsheet Row  Counselor from 03/03/2023 in [facility] at [facility]  Total GAD-7 Score 20      PHQ2-9    Flowsheet Row Counselor from 03/03/2023 in [facility] [place] Counselor from 12/09/2022 in [facility] PROGRAM Office Visit from 02/12/2016 in Primary Care at [place] Office Visit from 06/30/2015 in Primary Care at [facility] Visit from 12/02/2014 in Primary Care at [facility] Total Score 6 6 0 2 0  PHQ-9 Total Score 21 20 -- 19 --      Flowsheet Row Counselor from 03/03/2023 in [facility] at [facility] ED from 01/14/2023 in [facility] Department at [facility] Counselor from 12/09/2022 in BEHAVIORAL HEALTH PARTIAL HOSPITALIZATION PROGRAM  C-SSRS RISK CATEGORY Low Risk No Risk Low Risk       Collaboration of Care: Collaboration of Care: Dr. Mercy Riding  Patient/Guardian was advised Release of Information must be obtained prior to any record release in order to collaborate their care with an outside provider. Patient/Guardian was advised if they have not already done so to contact the registration department to sign all necessary forms in order for [place] to release information regarding their care.   Consent: Patient/Guardian gives verbal consent for treatment and assignment of benefits for services provided during this visit. Patient/Guardian expressed understanding and agreed to proceed.   Lamar Sprinkles, MD 03/09/2023 2:32 PM

## 2023-03-10 MED ORDER — ZOLPIDEM TARTRATE ER 12.5 MG PO TBCR: 12.5000 mg | EXTENDED_RELEASE_TABLET | Freq: Every evening | ORAL | 0 refills | Status: DC | PRN

## 2023-03-14 ENCOUNTER — Ambulatory Visit (HOSPITAL_COMMUNITY): Payer: No Typology Code available for payment source | Admitting: Clinical

## 2023-03-14 ENCOUNTER — Encounter (HOSPITAL_COMMUNITY): Payer: Self-pay | Admitting: Student

## 2023-03-15 NOTE — Addendum Note (Signed)
Addended by: Everlena Cooper on: 03/15/2023 08:30 AM   Modules accepted: Level of Service

## 2023-03-16 ENCOUNTER — Telehealth (HOSPITAL_COMMUNITY): Payer: Self-pay | Admitting: *Deleted

## 2023-03-16 NOTE — Telephone Encounter (Signed)
Yes, paperwork to be submitted as soon as I get to the office today, thanks.

## 2023-03-16 NOTE — Telephone Encounter (Signed)
Thank you :)

## 2023-03-16 NOTE — Telephone Encounter (Signed)
Pt called to requested completed FMLA form (due today) as was discussed on 02/23/23 MyChart message with pt. Please review. Thanks.

## 2023-03-24 ENCOUNTER — Telehealth (HOSPITAL_COMMUNITY): Payer: Self-pay | Admitting: *Deleted

## 2023-03-24 NOTE — Telephone Encounter (Signed)
Pt called and sent a fax regarding the FMLA paperwork that has been sent on her behalf. FMLA was submitted to [facility] however there is a section on page 1, Step 1, requiring start date and frequency of visits/flares that needs to be filled out and initialed with date. This has to be completed for FMLA to cover pt. I can fax the forms to you.

## 2023-03-28 ENCOUNTER — Telehealth (HOSPITAL_COMMUNITY): Payer: Self-pay | Admitting: Clinical

## 2023-04-18 ENCOUNTER — Ambulatory Visit (HOSPITAL_BASED_OUTPATIENT_CLINIC_OR_DEPARTMENT_OTHER): Payer: PRIVATE HEALTH INSURANCE | Admitting: Student

## 2023-04-18 VITALS — BP 112/76 | HR 93 | Ht 64.0 in | Wt 174.0 lb

## 2023-04-18 DIAGNOSIS — Z79899 Other long term (current) drug therapy: Secondary | ICD-10-CM

## 2023-04-18 DIAGNOSIS — R4589 Other symptoms and signs involving emotional state: Secondary | ICD-10-CM

## 2023-04-18 DIAGNOSIS — G4709 Other insomnia: Secondary | ICD-10-CM | POA: Diagnosis not present

## 2023-04-18 DIAGNOSIS — F4381 Prolonged grief disorder: Secondary | ICD-10-CM | POA: Diagnosis not present

## 2023-04-18 DIAGNOSIS — F3130 Bipolar disorder, current episode depressed, mild or moderate severity, unspecified: Secondary | ICD-10-CM

## 2023-04-18 DIAGNOSIS — F1320 Sedative, hypnotic or anxiolytic dependence, uncomplicated: Secondary | ICD-10-CM

## 2023-04-18 NOTE — Progress Notes (Signed)
BH MD Outpatient Progress Note  04/18/2023 3:06 PM  Stacey Weaver  MRN:  [id number]  Assessment:  [name] presents for follow-up evaluation in-person. Today, 04/18/2023 , patient reports feeling some better overall.  She did have a difficult night of sleep Saturday night, which has led to fear of falling asleep since, but she otherwise denies significant problems sleeping.  She has not been in therapy nor grief counseling, but still intends to seek resources, which were again discussed today.  She denies problems with her medication regimen overall.  Since her medication regimen is with so many controlled substances, aimed to target dual BZD first.  Initially discussed plan was to switch Klonopin to Valium for an easier taper, but on PDMP review, noted patient has prescription of Klonopin with refills from another psychiatric provider.  Need to discuss with the patient all psychiatric care being under 1 provider prior to sending refills.   Will also begin to taper Lamictal during next visit, maximizing Tegretol.  Will decrease Lamictal before Tegretol, as the latter is in inducer of UDP Glucuronyltransferase, which could significantly increase the availability of Lamictal if Tegretol were reduced first.  Patient is in agreement with this plan.  Patient continues to endorse chronic SI, but she denies active SI at this time.  Her brother remains a protective factor for her.  As well, as previously discussed, patient has given some thought to intrinsic motivation for living as well as some thoughts into what defines her as a person.    Identifying Information: Stacey Weaver is a 48 y.o. female with a history of bipolar 1 disorder, difficulty coping, grief, and recent completion of PHP  who is an established patient with [facility] for medication management.   Risk Assessment: An assessment of suicide and violence risk factors was performed as part of  this evaluation and is not significantly changed from the last visit.             While future psychiatric events cannot be accurately predicted, the patient does not currently require acute inpatient psychiatric care and does not currently meet [facility] involuntary commitment criteria.          Plan:  # Bipolar 1 Disorder Past medication trials:  Status of problem: Current episode depressed Interventions: -- Continue Tegretol 200 mg twice daily for mood stabilization -- Continue Lamictal 100 mg daily for mood stabilization; will plan to decrease dosage during next visit. -- Continue Zoloft 150 mg daily for depressive symptoms     # Difficulty coping #Other insomnia Past medication trials:  Status of problem: Ongoing Interventions: -- Continue prazosin 1 mg nightly for nightmares --Continue Ambien CR 12.5 mg nightly as needed for sleep -- Switch to Valium 10 mg BID PRN anxiety (Equivalent of current RX 1 mg BID PRN). This to occur after discussing current care and need to have care only under one prescriber. -- Will continue trazodone prescription directions as prescribed (300 mg nightly as needed) but advised patient to not take trazodone unless she is unable to fall asleep after an hour of taking prazosin and Ambien. -- Patient to continue therapy with Marcell Barlow, LCSW    # Weight loss Past medication trials:  Status of problem: Managed by PCP Interventions: -- Patient currently taking phentermine 15 mg daily.    Return to care in 4-6 weeks  Patient was given contact information for behavioral health clinic and was instructed to call 911 for emergencies.  Patient and plan of care will be discussed with the Attending MD ,Dr. Adrian Blackwater, who agrees with the above statement and plan.   Subjective:  Chief Complaint:  Chief Complaint  Patient presents with   Follow-up   Medication Refill   Stress    Interval History: Patient reports things have been "okay"  overall. She has more mood swings since last visit. She has noted more extremes. She feels elevated and happy for 2-3 days, then crashes feeling down and depressed for 4-5 days. Excessive happiness is bubbly, wanting to be around others, and hyperverbal. Excessive over the past month.   The anniversary of her mom's death was 03/19/24. Afterward, is when excessive. Sleep has been "fair" outside of last night. Nightmares still, but had a really scary one Sunday morning. Overall, images in nightmares are better.   Trigger: People with respiratory issues. Sees images of mom with guttural breathing. She is able to get help for seeing those patients. Works in urgent care.   Denies active SI, endorses passive SI. This is chronic.   Insight that she does not have good coping mechanisms. In the past, engaged in hobbies, home decor.   She reports medication compliance. She reports taking Ambien less. Taking Klonopin on average 2x per day.     Visit Diagnosis:    ICD-10-CM   1. Bipolar I disorder, most recent episode depressed (HCC)  F31.30     2. Prolonged grief disorder  F43.81     3. Difficulty coping  R45.89     4. Other insomnia  G47.09        Past Psychiatric History:  Diagnoses: Bipolar 1 disorder, Medication trials: Haldol, Abilify (previously discontinued due to cost), Klonopin, Lamictal, Wellbutrin, Prozac, Cymbalta (experienced muscle spasms and suicidal thoughts), Geodon and Zyprexa (experienced hallucinations), Navane Previous psychiatrist/therapist: [facility] with Dr. Clyde Canterbury as her outpatient psychiatrist Hospitalizations: [facility] in 2009, multiple times in 2010, 2012, 2017 Suicide attempts: 2012-overdose of Wellbutrin 150 mg; overdose in 2013 on Wellbutrin and Benadryl SIB: Denies Hx of violence towards others: Denies Current access to guns: Denies Hx of trauma/abuse: Denies  Past Medical History:  Past Medical  History:  Diagnosis Date   Antihistamines overdose 08/08/2011   Anxiety    Asthma    Bipolar 1 disorder (HCC)    Depression 08/07/2011   MDD (major depressive disorder) 10/10/2015   Migraine    Severe episode of recurrent major depressive disorder, without psychotic features (HCC)    SSRI overdose 08/07/2011    Past Surgical History:  Procedure Laterality Date   KNEE SURGERY      Family Psychiatric History:   Family History:  Family History  Problem Relation Age of Onset   Hyperlipidemia Mother    Hypertension Mother    Heart disease Father 59       CAD   Hyperlipidemia Maternal Grandmother    Cancer Maternal Grandmother    Cancer Maternal Grandfather    Hyperlipidemia Brother     Social History:  Academic/Vocational:  Social History   Socioeconomic History   Marital status: Single    Spouse name: Not on file   Number of children: Not on file   Years of education: Not on file   Highest education level: Not on file  Occupational History   Not on file  Tobacco Use   Smoking status: Never   Smokeless tobacco: Never  Substance and Sexual Activity   Alcohol use: No  Drug use: No   Sexual activity: Yes    Birth control/protection: I.U.D.  Other Topics Concern   Not on file  Social History Narrative   Not on file   Social Drivers of Health   Financial Resource Strain: Not on file  Food Insecurity: Medium Risk (11/04/2022)   Received from [facility]   Hunger Vital Sign    Worried About Running Out of Food in the Last Year: Sometimes true    Ran Out of Food in the Last Year: Sometimes true  Transportation Needs: Not on file (11/04/2022)  Physical Activity: Not on file  Stress: Not on file  Social Connections: Unknown (08/04/2021)   [facility]   Social Network    Social Network: Not on file    Allergies:  Allergies  Allergen Reactions   Almotriptan Malate Shortness Of Breath and Other (See Comments)    Muscle spasms    Cymbalta [Duloxetine Hcl] Other (See Comments)    Other reaction(s): Other Muscle spasms suicidal thoughts   Imitrex [Sumatriptan] Shortness Of Breath, Other (See Comments) and Palpitations    Muscle spasms   Prednisone Other (See Comments) and Palpitations   Zomig Shortness Of Breath   Geodon [Ziprasidone Hcl] Other (See Comments)    hallucinations   Lactose Intolerance (Gi) Diarrhea and Other (See Comments)    Gas and bloated    Penicillins Other (See Comments)    GI upset  Has patient had a PCN reaction causing immediate rash, facial/tongue/throat swelling, SOB or lightheadedness with hypotension: No Has patient had a PCN reaction causing severe rash involving mucus membranes or skin necrosis: No Has patient had a PCN reaction that required hospitalization No Has patient had a PCN reaction occurring within the last 10 years: Yes If all of the above answers are "NO", then may proceed with Cephalosporin use.    Zyprexa [Olanzapine] Other (See Comments)    Hallucinations     Current Medications: Current Outpatient Medications  Medication Sig Dispense Refill   atorvastatin (LIPITOR) 10 MG tablet Take 10 mg by mouth daily.     carbamazepine (TEGRETOL XR) 200 MG 12 hr tablet Take 1 tablet (200 mg total) by mouth 2 (two) times daily. 60 tablet 1   lamoTRIgine (LAMICTAL) 100 MG tablet Take 1 tablet (100 mg total) by mouth daily. 30 tablet 1   phentermine 15 MG capsule Take 15 mg by mouth daily.     prazosin (MINIPRESS) 1 MG capsule Take 1 capsule (1 mg total) by mouth at bedtime. 30 capsule 1   sertraline (ZOLOFT) 100 MG tablet Take 1.5 tablets (150 mg total) by mouth daily. 45 tablet 1   traZODone (DESYREL) 100 MG tablet Take 3 tablets by mouth at bedtime.     UBRELVY 100 MG TABS Take by mouth.     WEGOVY 0.5 MG/0.5ML SOAJ Inject 0.5 mg into the skin once a week.     ibuprofen (ADVIL) 800 MG tablet Take 1 tablet (800 mg total) by mouth 3 (three) times daily. 21 tablet 0   zolpidem  (AMBIEN CR) 12.5 MG CR tablet Take 1 tablet (12.5 mg total) by mouth at bedtime as needed for sleep. 30 tablet 0   No current facility-administered medications for this visit.    ROS: Review of Systems   Objective:  Psychiatric Specialty Exam: Blood pressure 112/76, pulse 93, height 5\' 4"  (1.626 m), weight 174 lb (78.9 kg).Body mass index is 29.87 kg/m.  General Appearance: Casual and Well  Groomed  Eye Contact:  Good  Speech:  Clear and Coherent and Normal Rate  Volume:  Normal  Mood:  Depressed and Dysphoric  Affect:  Depressed and Tearful  Thought Content: WDL and Logical   Suicidal Thoughts:  Yes.  without intent/plan  Homicidal Thoughts:  No  Thought Process:  Coherent, Goal Directed, and Linear  Orientation:  Full (Time, Place, and Person)    Memory: Immediate;   Good Recent;   Good Remote;   Fair  Judgment:  Fair  Insight:  Fair and Shallow  Concentration:  Concentration: Good and Attention Span: Good  Recall: not formally assessed   Fund of Knowledge: Good  Language: Good  Psychomotor Activity:  Normal  Akathisia:  No  AIMS (if indicated): not done  Assets:  Communication Skills Desire for Improvement Financial Resources/Insurance Housing Intimacy Leisure Time Physical Health Resilience Social Support Talents/Skills Transportation Vocational/Educational  ADL's:  Intact  Cognition: WNL  Sleep:  Poor   PE: General: well-appearing; no acute distress  Pulm: no increased work of breathing on room air  Strength & Muscle Tone: within normal limits Neuro: no focal neurological deficits observed  Gait & Station: normal  Metabolic Disorder Labs: No results found for: "HGBA1C", "MPG" No results found for: "PROLACTIN" Lab Results  Component Value Date   CHOL 250 (H) 01/29/2016   TRIG 87.0 01/29/2016   HDL 65.40 01/29/2016   CHOLHDL 4 01/29/2016   VLDL 17.4 01/29/2016   LDLCALC 167 (H) 01/29/2016   Lab Results  Component Value Date   TSH 1.36  06/30/2015   TSH 4.132 08/24/2011    Therapeutic Level Labs: No results found for: "LITHIUM" Lab Results  Component Value Date   VALPROATE 32.4 (L) 10/14/2009   VALPROATE 68.2 10/19/2008   Lab Results  Component Value Date   CBMZ 8.7 10/14/2015    Screenings: AIMS    Flowsheet Row [facility]  AIMS Total Score 0      AUDIT    Flowsheet Row [facility]  Alcohol Use Disorder Identification Test Final Score (AUDIT) 0      GAD-7    Flowsheet Row Counselor from 03/03/2023 in [facility]  Total GAD-7 Score 20      PHQ2-9    Flowsheet Row Counselor from 03/03/2023 in [facility] [place] Counselor from 12/09/2022 in [facility] Office Visit from 02/12/2016 in Primary Care at [place] Office Visit from 06/30/2015 in Primary Care at [facility] Visit from 12/02/2014 in Primary Care at [facility] Total Score 6 6 0 2 0  PHQ-9 Total Score 21 20 -- 19 --      Flowsheet Row Counselor from 03/03/2023 in [facility] ED from 01/14/2023 in [facility] Counselor from 12/09/2022 in [facility]AL [facility] PARTIAL HOSPITALIZATION PROGRAM  C-SSRS RISK CATEGORY Low Risk No Risk Low Risk       Collaboration of Care: Collaboration of Care: Dr. Adrian Blackwater  Patient/Guardian was advised Release of Information must be obtained prior to any record release in order to collaborate their care with an outside provider. Patient/Guardian was advised if they have not already done so to contact the registration department to sign all necessary forms in order for [place] to release information regarding their care.   Consent: Patient/Guardian gives verbal  consent for  treatment and assignment of benefits for services provided during this visit. Patient/Guardian expressed understanding and agreed to proceed.   Lamar Sprinkles, MD 04/18/2023 3:06 PM

## 2023-04-19 ENCOUNTER — Encounter (HOSPITAL_COMMUNITY): Payer: Self-pay | Admitting: Student

## 2023-04-21 ENCOUNTER — Telehealth (HOSPITAL_COMMUNITY): Payer: Self-pay

## 2023-04-21 NOTE — Telephone Encounter (Signed)
Patient called this morning for a refill on Valium and Zolpidem. She said you were supposed to send this to the pharmacy after her last visit. Please review and advise, thank you

## 2023-04-25 ENCOUNTER — Encounter (HOSPITAL_COMMUNITY): Payer: Self-pay | Admitting: Student

## 2023-04-25 DIAGNOSIS — F1320 Sedative, hypnotic or anxiolytic dependence, uncomplicated: Secondary | ICD-10-CM | POA: Insufficient documentation

## 2023-04-25 DIAGNOSIS — Z79899 Other long term (current) drug therapy: Secondary | ICD-10-CM | POA: Insufficient documentation

## 2023-04-25 MED ORDER — ZOLPIDEM TARTRATE ER 12.5 MG PO TBCR: 12.5000 mg | EXTENDED_RELEASE_TABLET | Freq: Every evening | ORAL | 0 refills | Status: DC | PRN

## 2023-04-25 MED ORDER — DIAZEPAM 10 MG PO TABS: 10.0000 mg | ORAL_TABLET | Freq: Two times a day (BID) | ORAL | 0 refills | Status: DC | PRN

## 2023-04-25 NOTE — Telephone Encounter (Signed)
Patient reports that Dr. Guss Bunde was her previous prescriber, whom she has not seen since prior to PHP/IOP. She insists that she has not picked up the Klonopin and that Dr. Guss Bunde automatically sent refills to [facility]. She confirms she will not pick up nor take Klonpoin, and this Clinical research associate advises I will call [facility] to cancel the prescription, to which patient provides no objection.   Called [facility] [phone]) and spoke to Janelle Floor and Fleet Contras (from mail order department), who confirmed that Klonopin was delivered 12/18 for a 30 day supply, which would have ended ahead of our 1/20 appointment when the decision was made to switch therapies. This RX has two remaining refills, and patient had not attempted to refill. Confirmed with Fleet Contras that Dr. Robert Bellow for Klonopin will be discontinued.   I will send in refill of Ambien ER 12.5 mg at bedtime as well as new RX of Diazepam 10 mg BID PRN (dose equivalent of previously prescribed Klonopin 1 mg BID PRN) to begin to titrate. Patient voiced understanding and agreement.     Lamar Sprinkles, MD PGY-3 04/25/2023  4:41 PM [facility]

## 2023-04-28 ENCOUNTER — Ambulatory Visit (INDEPENDENT_AMBULATORY_CARE_PROVIDER_SITE_OTHER): Payer: PRIVATE HEALTH INSURANCE | Admitting: Clinical

## 2023-04-28 ENCOUNTER — Encounter (HOSPITAL_COMMUNITY): Payer: Self-pay | Admitting: Clinical

## 2023-04-28 DIAGNOSIS — F3130 Bipolar disorder, current episode depressed, mild or moderate severity, unspecified: Secondary | ICD-10-CM

## 2023-04-28 DIAGNOSIS — F411 Generalized anxiety disorder: Secondary | ICD-10-CM | POA: Diagnosis not present

## 2023-04-28 DIAGNOSIS — F4381 Prolonged grief disorder: Secondary | ICD-10-CM

## 2023-04-28 NOTE — Progress Notes (Addendum)
THERAPIST PROGRESS NOTE  Session Time: 8:00am-9:00am  Session #2  Virtual Visit via Video Note  I connected with Doloris Billey Co on 05/01/23 at  8:00 AM EST by a video enabled telemedicine application and verified that I am speaking with the correct person using two identifiers.  Location: Patient: home Provider: [facility] outpatient therapy office   I discussed the limitations of evaluation and management by telemedicine and the availability of in person appointments. The patient expressed understanding and agreed to proceed.   I discussed the assessment and treatment plan with the patient. The patient was provided an opportunity to ask questions and all were answered. The patient agreed with the plan and demonstrated an understanding of the instructions.   The patient was advised to call back or seek an in-person evaluation if the symptoms worsen or if the condition fails to improve as anticipated.  I provided 55 minutes of non-face-to-face time during this encounter and took 5 minutes to email her resources.  Lynnell Chad, LCSW   Participation Level: Active  Behavioral Response: Casual Alert Anxious and Depressed  Type of Therapy: Individual Therapy  Treatment Goals addressed:  Goal: LTG: Score less than 5 on the GAD-7 as evidenced by intermittent administration of the questionnaire to determine progress in management of anxiety.   Goal: STG: Sailor will practice problem solving skills 3 times per week for the next 4 weeks. Goal: LTG: Learn about the feeling of anxiety and its many variations, the cycle of anxiety and how to interrupt that cycle.   Goal: STG: Learn breathing techniques and grounding techniques at an age-appropriate level and demonstrate mastery in session then report independent use of these skills out of session.   Goal: LTG: Reduce frequency, intensity, and duration of depression symptoms so that daily functioning is improved Goal: STG:  Score less than 9 on the Patient Health Questionnaire (PHQ-9) as evidenced by intermittent administration of the questionnaire to determine progress.   Goal: STG: Identify and decrease cognitive distortions contributing negatively to mood and behavior by identifying 5-7 cognitive distortions patient has and learning how to come up with replacement thoughts that are more balanced, realistic, and helpful.   Goal: LTG: Work to Arts development officer from models like CBT, Stages of Change, DBT, shame resilience theory, ACT, SFBT, MI, trauma-informed therapy and others to be able to manage mental health symptoms, AEB practicing out of session and reporting back.   Goal: STG: Patient will work on forgiveness, shame, sleep, relationship to food, or other issues as appropriate and as such issues present during sessions   Goal: LTG: Learn and practice communication techniques such as "I" statements, open-ended questions, reflective listening, assertiveness, fair fighting rules, initiating conversations, and more as necessary and taught in session   Goal: LTG: Recall traumatic events without becoming overwhelmed with negative emotions Goal: STG: Nevea will practice emotion regulation skills, distress tolerance skills, mindfulness skills, and interpersonal effectiveness skills weekly for the next 26 week(s) Goal: STG: Report a decrease in PTSD symptoms as evidenced by a 25% reduction in overall score on a clinician administered PTSD assessment screen/scale Goal: STG: Learn about boundary types, how to implement them, and how to enforce them so that patient feels more empowered and content with being able to maintain more helpful, appropriate boundaries in the future for a more balanced result.    ProgressTowards Goals: Progressing  Interventions: CBT and Supportive  Summary: Milyn Avantika Shere is a 48 y.o. female who presents with prolonged grief and  Bipolar 1 disorder. 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   She processed how things have been going and how she feels about taking the day off due to not feeling she can work.  She is very concerned and anxious currently about her brother who is her only remaining family member and support system since the deaths of her mother and grandmother.  This week she went to a gastroenterology appointment with her brother because he is having issues that appear to be serious.  He is being sent to a hematologist, so she is taking it upon herself to make the appointments he needs, stating that he would not do it himself so she has to take it on.  This has always been her role in the family. He does not think anything is wrong because he is not experiencing any pain.  She, on the other hand, is panicked because it is "deja vu" with her mother's colon cancer journey that she helped her through.  When she is anxious she usually secludes, which of course she is doing today, was pointed out to her.    CSW introduced the concept of CBT using pictorial of a thought-feeling-action triangle which was shared on-screen.  Specifically, CSW explained how an activating event leads to a thought, which then affects and often determines our feelings, followed by our behaviors.  This was demonstrated with examples until patient verbalized understanding.  CSW provided patient with a worksheet (on-screen and by emal)  of the most common cognitive distortions, and we reviewed these with a variety of examples given.  CSW made some of the examples personal to the patient, who was able to comprehend at a fair to good level.  She took notes throughout the session, stating she wants to do research afterward.  CSW then introduced another worksheet and went through this with patient to challenge  current thoughts leading to the uncomfortable feeling of anxiety, as follows: What to do with Cognitive Distortions   (How to determine if you're getting stuck in a thinking trap and how to reframe it to get unstuck) Name my feeling:  Anxiety What thought(s) that led to this feeling: "Here we go again, this time with my brother instead of my mother, but the same thing is going to happen" Is this thought a FACT?      Yes  ____     No  _X___   What advice would I give a friend about this?  "You don't know that.  Wait for the test results, and then you can make plans about how to hande it"   Is the thought helpful or beneficial?      Yes  ____     No  _X___   What is a different thought that might be more helpful and realistic? "We need to see the doctors to find out what is happening before I panic."   What is your new feeling? "More relaxed, not as anxious"    CSW moved on to explain how it will be helpful for her to continue to review the worksheets after session and start to recognize thoughts that are distorted.  We discussed keeping a short thought log until next session so that it can be discussed whether the thoughts could  be a cognitive distortion land the associated consequences or choices. She identified catastrophizing as her almost constant cognitive distortion.  She asked for, and received, an email with the above handouts, a glossary of CBT terms, and 2 YouTube videos to explain more.  Suicidal/Homicidal: No without intent/plan  Therapist Response: Patient is progressing AEB engaging in scheduled therapy session.  Throughout the session, CSW gave patient the opportunity to explore thoughts and feelings associated with current life situations and past/present stressors.   CSW challenged patient gently and appropriately to consider different ways of looking at reported issues. CSW encouraged patient's expression of feelings and validated these using empathy, active listening, open body  language, and unconditional positive regard.   CSW encouraged patient to schedule more therapy sessions for the future, as needed.    Plan: Return again in 1 week on 2/6  Diagnosis:  Generalized anxiety disorder  Prolonged grief disorder  Bipolar I disorder, most recent episode depressed (HCC)  Collaboration of Care: Psychiatrist AEB - psychiatrist can read therapy notes; therapist can and does read psychiatric notes prior to sessions   Patient/Guardian was advised Release of Information must be obtained prior to any record release in order to collaborate their care with an outside provider. Patient/Guardian was advised if they have not already done so to contact the registration department to sign all necessary forms in order for [place] to release information regarding their care.   Consent: Patient/Guardian gives verbal consent for treatment and assignment of benefits for services provided during this visit. Patient/Guardian expressed understanding and agreed to proceed.   Lynnell Chad, LCSW 04/28/2023

## 2023-05-05 ENCOUNTER — Encounter (HOSPITAL_COMMUNITY): Payer: Self-pay | Admitting: Clinical

## 2023-05-05 ENCOUNTER — Ambulatory Visit (HOSPITAL_COMMUNITY): Payer: No Typology Code available for payment source | Admitting: Clinical

## 2023-05-05 DIAGNOSIS — F411 Generalized anxiety disorder: Secondary | ICD-10-CM

## 2023-05-05 DIAGNOSIS — F4381 Prolonged grief disorder: Secondary | ICD-10-CM | POA: Diagnosis not present

## 2023-05-05 DIAGNOSIS — F3130 Bipolar disorder, current episode depressed, mild or moderate severity, unspecified: Secondary | ICD-10-CM

## 2023-05-05 DIAGNOSIS — F319 Bipolar disorder, unspecified: Secondary | ICD-10-CM
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
## 2023-05-11 ENCOUNTER — Ambulatory Visit (HOSPITAL_COMMUNITY): Payer: No Typology Code available for payment source | Admitting: Clinical

## 2023-05-16 ENCOUNTER — Ambulatory Visit (INDEPENDENT_AMBULATORY_CARE_PROVIDER_SITE_OTHER): Payer: No Typology Code available for payment source | Admitting: Clinical

## 2023-05-16 DIAGNOSIS — Z91199 Patient's noncompliance with other medical treatment and regimen due to unspecified reason: Secondary | ICD-10-CM

## 2023-05-16 NOTE — Progress Notes (Signed)
Therapy Progress Note  Patient had an appointment scheduled with therapist on 05/16/2023  at 2:00pm.  She called in to the front office this morning to cancel, without explanation reported.  She also changed all future appointments to virtual.  Encounter Diagnosis  Name Primary?   No-show for appointment Yes     Ambrose Mantle, LCSW 05/16/2023, 3:31 PM

## 2023-05-23 ENCOUNTER — Telehealth (HOSPITAL_COMMUNITY): Payer: Self-pay | Admitting: *Deleted

## 2023-05-23 ENCOUNTER — Other Ambulatory Visit (HOSPITAL_COMMUNITY): Payer: Self-pay | Admitting: Student

## 2023-05-23 DIAGNOSIS — F3130 Bipolar disorder, current episode depressed, mild or moderate severity, unspecified: Secondary | ICD-10-CM

## 2023-05-23 DIAGNOSIS — F4381 Prolonged grief disorder: Secondary | ICD-10-CM

## 2023-05-23 DIAGNOSIS — R4589 Other symptoms and signs involving emotional state: Secondary | ICD-10-CM

## 2023-05-23 MED ORDER — ZOLPIDEM TARTRATE ER 12.5 MG PO TBCR: 12.5000 mg | EXTENDED_RELEASE_TABLET | Freq: Every evening | ORAL | 0 refills | Status: DC | PRN

## 2023-05-23 MED ORDER — SERTRALINE HCL 100 MG PO TABS: 150.0000 mg | ORAL_TABLET | Freq: Every day | ORAL | 1 refills | Status: DC

## 2023-05-23 MED ORDER — CARBAMAZEPINE ER 200 MG PO TB12: 200.0000 mg | ORAL_TABLET | Freq: Two times a day (BID) | ORAL | 1 refills | Status: DC

## 2023-05-23 MED ORDER — DIAZEPAM 10 MG PO TABS: 10.0000 mg | ORAL_TABLET | Freq: Two times a day (BID) | ORAL | 0 refills | Status: DC | PRN

## 2023-05-23 MED ORDER — PRAZOSIN HCL 1 MG PO CAPS: 1.0000 mg | ORAL_CAPSULE | Freq: Every day | ORAL | 1 refills | Status: DC

## 2023-05-23 MED ORDER — LAMOTRIGINE 100 MG PO TABS: 100.0000 mg | ORAL_TABLET | Freq: Every day | ORAL | 0 refills | Status: DC

## 2023-05-23 NOTE — Telephone Encounter (Signed)
 Done.  30-day supplies sent in for all medications except Valium and Lamictal, as patient is aware that we are tapering both of these medications.  15-day supply of both of these provided.  Dr. Alfonse Flavors

## 2023-05-23 NOTE — Telephone Encounter (Signed)
 Pt called requesting refills on Ambien CR 12.5 mg, Valium 10 mg, and Minipress 1 mg. Pt was last seen on 04/18/23 and has f/u scheduled for 05/30/23. Pt would like scripts sent to mail order pharmacy ([facility]) due to price being less than [employer]. Please review.

## 2023-05-30 ENCOUNTER — Ambulatory Visit (HOSPITAL_COMMUNITY): Payer: PRIVATE HEALTH INSURANCE | Admitting: Student

## 2023-05-30 DIAGNOSIS — F3130 Bipolar disorder, current episode depressed, mild or moderate severity, unspecified: Secondary | ICD-10-CM

## 2023-05-30 DIAGNOSIS — R4589 Other symptoms and signs involving emotional state: Secondary | ICD-10-CM

## 2023-05-30 DIAGNOSIS — F4381 Prolonged grief disorder: Secondary | ICD-10-CM

## 2023-05-30 MED ORDER — LAMOTRIGINE 25 MG PO TABS: 50.0000 mg | ORAL_TABLET | Freq: Every day | ORAL | 0 refills | Status: DC

## 2023-05-30 MED ORDER — DIAZEPAM 2 MG PO TABS: 2.0000 mg | ORAL_TABLET | Freq: Two times a day (BID) | ORAL | 0 refills | Status: AC | PRN

## 2023-05-30 NOTE — Patient Instructions (Signed)
 Decrease to Lamictal 50 mg daily when prescription is received in the mail and until next visit.  Decrease to Valium 7 mg twice daily as needed for anxiety. May cut 10 mg tablets in half and take with 2 mg tablet that will be sent in the mail.

## 2023-05-30 NOTE — Progress Notes (Signed)
 BH MD Outpatient Progress Note  05/30/2023 2:05 PM  Omunique Canisha Issac  MRN:  [id number]  Assessment:  [name] presents for follow-up evaluation in-person. Today, 05/30/2023  , patient reports feeling some better overall.  She has had overall improvements to sleep.  She has been in therapy, which she has found beneficial. She plans to start group therapy with [facility] as well.  She denies problems with her medication regimen overall.  Since her medication regimen is with so many controlled substances, aim to target dual BZD. Will continue taper of Valium, switched from Klonopin.  Will also begin to taper Lamictal today, maximizing Tegretol.  Will decrease Lamictal before Tegretol, as the latter is in inducer of UDP Glucuronyltransferase, which could significantly increase the availability of Lamictal if Tegretol were reduced first.  Patient is in agreement with this plan.  Patient denies SI, passive and active today.  She poses no safety concerns toward herself or others at this time.   Identifying Information: Jaielle Latecia Miler is a 48 y.o. female with a history of bipolar 1 disorder, difficulty coping, grief, and recent completion of PHP  who is an established patient with [facility] for medication management.   Risk Assessment: An assessment of suicide and violence risk factors was performed as part of this evaluation and is not significantly changed from the last visit.             While future psychiatric events cannot be accurately predicted, the patient does not currently require acute inpatient psychiatric care and does not currently meet [facility] involuntary commitment criteria.          Plan:  # Bipolar 1 Disorder Past medication trials:  Status of problem: Current episode depressed Interventions: -- Continue Tegretol 200 mg twice daily for mood stabilization -- Decrease to Lamictal 50 mg daily for mood stabilization; patient previously  taking 100 mg (charted as 50 mg last visit in error). -- Continue Zoloft 150 mg daily for depressive symptoms     # Difficulty coping #Other insomnia Past medication trials:  Status of problem: Ongoing Interventions: -- Continue prazosin 1 mg nightly for nightmares --Continue Ambien CR 12.5 mg nightly as needed for sleep -- Decrease to Valium 7 mg BID PRN anxiety. Sent in RX for 2 mg tablet, and patient may cut 10 mg tablets on hand in half for total of 7 mg -- Will continue trazodone prescription directions as prescribed (300 mg nightly as needed) but advised patient to not take trazodone unless she is unable to fall asleep after an hour of taking prazosin and Ambien. -- Patient to continue therapy with Marcell Barlow, LCSW    # Weight loss Past medication trials:  Status of problem: Managed by PCP Interventions: -- Patient currently taking phentermine 15 mg daily.    Return to care in 4-6 weeks  Patient was given contact information for behavioral health clinic and was instructed to call 911 for emergencies.    Patient and plan of care will be discussed with the Attending MD ,Dr. Mercy Riding, who agrees with the above statement and plan.   Subjective:  Chief Complaint:  Chief Complaint  Patient presents with   Follow-up   Medication Refill   Stress    Interval History: Patient reports things have been "okay" overall. Patient did have a friend pass away, but did not attend funeral. She has been watching videos provided by therapist. She is doing therapy-related assingments twice per week. She feels comfortable  talking to [place]. She is thinking about joining the group session.   Sleep is a "hit or miss." Overall "okay." Poor sleep 2 days per night. On those nights, 4-5 broken sleep hours. Other nights, getting 7 hours of sleep. Wakes for 1-1.5 hours. Work has been stressful.   Takes Valium, on average, 2x per week, one dose per day. Helps anxiety but makes her groggy.    Took Ambien last night and once last week. Taking once weekly, on average.  Denies active SI, endorses passive SI. This is chronic.   Insight that she does not have good coping mechanisms. In the past, engaged in hobbies, home decor.   She reports medication compliance. She reports taking Ambien less. Taking Klonopin on average 2x per day.     Visit Diagnosis:    ICD-10-CM   1. Bipolar I disorder, most recent episode depressed (HCC)  F31.30     2. Difficulty coping  R45.89     3. Prolonged grief disorder  F43.81         Past Psychiatric History:  Diagnoses: Bipolar 1 disorder, Medication trials: Haldol, Abilify (previously discontinued due to cost), Klonopin, Lamictal, Wellbutrin, Prozac, Cymbalta (experienced muscle spasms and suicidal thoughts), Geodon and Zyprexa (experienced hallucinations), Navane Previous psychiatrist/therapist: [facility] with Dr. Clyde Canterbury as her outpatient psychiatrist Hospitalizations: [facility] in 2009, multiple times in 2010, 2012, 2017 Suicide attempts: 2012-overdose of Wellbutrin 150 mg; overdose in 2013 on Wellbutrin and Benadryl SIB: Denies Hx of violence towards others: Denies Current access to guns: Denies Hx of trauma/abuse: Denies  Past Medical History:  Past Medical History:  Diagnosis Date   Antihistamines overdose 08/08/2011   Anxiety    Asthma    Bipolar 1 disorder (HCC)    Depression 08/07/2011   MDD (major depressive disorder) 10/10/2015   Migraine    Severe episode of recurrent major depressive disorder, without psychotic features (HCC)    SSRI overdose 08/07/2011    Past Surgical History:  Procedure Laterality Date   KNEE SURGERY      Family Psychiatric History:   Family History:  Family History  Problem Relation Age of Onset   Hyperlipidemia Mother    Hypertension Mother    Heart disease Father 57       CAD   Hyperlipidemia Maternal Grandmother    Cancer  Maternal Grandmother    Cancer Maternal Grandfather    Hyperlipidemia Brother     Social History:  Academic/Vocational:  Social History   Socioeconomic History   Marital status: Single    Spouse name: Not on file   Number of children: Not on file   Years of education: Not on file   Highest education level: Not on file  Occupational History   Not on file  Tobacco Use   Smoking status: Never   Smokeless tobacco: Never  Substance and Sexual Activity   Alcohol use: No   Drug use: No   Sexual activity: Yes    Birth control/protection: I.U.D.  Other Topics Concern   Not on file  Social History Narrative   Not on file   Social Drivers of Health   Financial Resource Strain: Not on file  Food Insecurity: Medium Risk (11/04/2022)   Received from [facility]   Hunger Vital Sign    Worried About Running Out of Food in the Last Year: Sometimes true    Ran Out of Food in the Last Year: Sometimes true  Transportation Needs:  Not on file (11/04/2022)  Physical Activity: Not on file  Stress: Not on file  Social Connections: Unknown (08/04/2021)   [facility]   Social Network    Social Network: Not on file    Allergies:  Allergies  Allergen Reactions   Almotriptan Malate Shortness Of Breath and Other (See Comments)    Muscle spasms   Cymbalta [Duloxetine Hcl] Other (See Comments)    Other reaction(s): Other Muscle spasms suicidal thoughts   Imitrex [Sumatriptan] Shortness Of Breath, Other (See Comments) and Palpitations    Muscle spasms   Prednisone Other (See Comments) and Palpitations   Zomig Shortness Of Breath   Geodon [Ziprasidone Hcl] Other (See Comments)    hallucinations   Lactose Intolerance (Gi) Diarrhea and Other (See Comments)    Gas and bloated    Penicillins Other (See Comments)    GI upset  Has patient had a PCN reaction causing immediate rash, facial/tongue/throat swelling, SOB or lightheadedness with hypotension: No Has  patient had a PCN reaction causing severe rash involving mucus membranes or skin necrosis: No Has patient had a PCN reaction that required hospitalization No Has patient had a PCN reaction occurring within the last 10 years: Yes If all of the above answers are "NO", then may proceed with Cephalosporin use.    Zyprexa [Olanzapine] Other (See Comments)    Hallucinations     Current Medications: Current Outpatient Medications  Medication Sig Dispense Refill   atorvastatin (LIPITOR) 10 MG tablet Take 10 mg by mouth daily.     carbamazepine (TEGRETOL XR) 200 MG 12 hr tablet Take 1 tablet (200 mg total) by mouth 2 (two) times daily. 60 tablet 1   diazepam (VALIUM) 2 MG tablet Take 1 tablet (2 mg total) by mouth 2 (two) times daily as needed for anxiety. Take with 5 mg (0.5 of 10 mg tablet) for total of 7 mg 2 times daily as needed for anxiety. 30 tablet 0   lamoTRIgine (LAMICTAL) 25 MG tablet Take 2 tablets (50 mg total) by mouth daily. 90 tablet 0   phentermine 15 MG capsule Take 15 mg by mouth daily.     prazosin (MINIPRESS) 1 MG capsule Take 1 capsule (1 mg total) by mouth at bedtime. 30 capsule 1   sertraline (ZOLOFT) 100 MG tablet Take 1.5 tablets (150 mg total) by mouth daily. 45 tablet 1   traZODone (DESYREL) 100 MG tablet Take 3 tablets by mouth at bedtime.     UBRELVY 100 MG TABS Take by mouth.     WEGOVY 0.5 MG/0.5ML SOAJ Inject 0.5 mg into the skin once a week.     zolpidem (AMBIEN CR) 12.5 MG CR tablet Take 1 tablet (12.5 mg total) by mouth at bedtime as needed for sleep. 30 tablet 0   No current facility-administered medications for this visit.    ROS: Review of Systems   Objective:  Psychiatric Specialty Exam: There were no vitals taken for this visit.There is no height or weight on file to calculate BMI.  General Appearance: Casual and Well Groomed  Eye Contact:  Good  Speech:  Clear and Coherent and Normal Rate  Volume:  Normal  Mood:  Depressed, but less  Affect:   Appropriate and Full Range  Thought Content: WDL and Logical   Suicidal Thoughts:  No  Homicidal Thoughts:  No  Thought Process:  Coherent, Goal Directed, and Linear  Orientation:  Full (Time, Place, and Person)    Memory: Immediate;  Good Recent;   Good Remote;   Fair  Judgment:  Good  Insight:  Fair  Concentration:  Concentration: Good and Attention Span: Good  Recall: not formally assessed   Fund of Knowledge: Good  Language: Good  Psychomotor Activity:  Normal  Akathisia:  No  AIMS (if indicated): not done  Assets:  Communication Skills Desire for Improvement Financial Resources/Insurance Housing Intimacy Leisure Time Physical Health Resilience Social Support Talents/Skills Transportation Vocational/Educational  ADL's:  Intact  Cognition: WNL  Sleep:  Good   PE: General: well-appearing; no acute distress  Pulm: no increased work of breathing on room air  Strength & Muscle Tone: within normal limits Neuro: no focal neurological deficits observed  Gait & Station: normal  Metabolic Disorder Labs: No results found for: "HGBA1C", "MPG" No results found for: "PROLACTIN" Lab Results  Component Value Date   CHOL 250 (H) 01/29/2016   TRIG 87.0 01/29/2016   HDL 65.40 01/29/2016   CHOLHDL 4 01/29/2016   VLDL 17.4 01/29/2016   LDLCALC 167 (H) 01/29/2016   Lab Results  Component Value Date   TSH 1.36 06/30/2015   TSH 4.132 08/24/2011    Therapeutic Level Labs: No results found for: "LITHIUM" Lab Results  Component Value Date   VALPROATE 32.4 (L) 10/14/2009   VALPROATE 68.2 10/19/2008   Lab Results  Component Value Date   CBMZ 8.7 10/14/2015    Screenings: AIMS    Flowsheet Row [facility]  AIMS Total Score 0      AUDIT    Flowsheet Row [facility]  Alcohol Use Disorder Identification Test Final  Score (AUDIT) 0      GAD-7    Flowsheet Row Counselor from 03/03/2023 in [facility]  Total GAD-7 Score 20      PHQ2-9    Flowsheet Row Counselor from 03/03/2023 in [facility] [place] Counselor from 12/09/2022 in [facility] Office Visit from 02/12/2016 in Primary Care at [place] Office Visit from 06/30/2015 in Primary Care at [facility] Visit from 12/02/2014 in Primary Care at [facility] Total Score 6 6 0 2 0  PHQ-9 Total Score 21 20 -- 19 --      Flowsheet Row Counselor from 03/03/2023 in [facility] ED from 01/14/2023 in [facility] at [facility] Counselor from 12/09/2022 in BEHAVIORAL [facility]ALTH PARTIAL HOSPITALIZATION PROGRAM  C-SSRS RISK CATEGORY Low Risk No Risk Low Risk       Collaboration of Care: Collaboration of Care: Dr. Mercy Riding  Patient/Guardian was advised Release of Information must be obtained prior to any record release in order to collaborate their care with an outside provider. Patient/Guardian was advised if they have not already done so to contact the registration department to sign all necessary forms in order for [place] to release information regarding their care.   Consent: Patient/Guardian gives verbal consent for treatment and assignment of benefits for services provided during this visit. Patient/Guardian expressed understanding and agreed to proceed.   Lamar Sprinkles, MD 05/30/2023 2:05 PM

## 2023-06-02 ENCOUNTER — Encounter (HOSPITAL_COMMUNITY): Payer: Self-pay | Admitting: Student

## 2023-06-03 NOTE — Addendum Note (Signed)
 Addended by: Everlena Cooper on: 06/03/2023 12:20 PM   Modules accepted: Level of Service

## 2023-06-08 ENCOUNTER — Encounter (HOSPITAL_COMMUNITY): Payer: Self-pay | Admitting: Clinical

## 2023-06-08 ENCOUNTER — Ambulatory Visit (HOSPITAL_COMMUNITY): Payer: PRIVATE HEALTH INSURANCE | Admitting: Clinical

## 2023-06-08 DIAGNOSIS — F411 Generalized anxiety disorder: Secondary | ICD-10-CM | POA: Diagnosis not present

## 2023-06-08 DIAGNOSIS — F3130 Bipolar disorder, current episode depressed, mild or moderate severity, unspecified: Secondary | ICD-10-CM | POA: Diagnosis not present

## 2023-06-08 DIAGNOSIS — F4381 Prolonged grief disorder: Secondary | ICD-10-CM | POA: Diagnosis not present

## 2023-06-08 NOTE — Progress Notes (Unsigned)
 Therapy Group Progress Note   06/08/2023   Group Time:  5:00pm-6:00pm  Participation Level:  Active   Behavioral Response: Appropriate, Sharing, and Care-Taking   Type of Therapy: Group Therapy   Check-In:  Stacey Weaver shared that she has been having issues with intensified anxiety over her health and her brother's health.  They both ended up in the emergency room at different hospitals for different reasons on 07-10-2023.  Both of them had passed out for different health concerns.  She is concerned that she can just turn on and off her emotions.  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  She stated she is happiest when she is at work taking care of other people, but that also drains her so that she cannot take care of herself.  Others in the group definitely related to how she masks with smiles in front of other people.  Intervention(s):  Much support was rendered by the other group members.  The majority of the session was spent in mutual support, in fact.  CSW provided psychoeducation on the CBT model and how to challenge thoughts in order to result in better feelings.  This was well-received and all group members appeared to comprehend and appreciate this way of looking at their negative thoughts in a more beneficial manner.   Summary of Progress:  Stacey Weaver verbalized full understanding of concepts about CBT and cognitive distortions as presented.  Patients supported each other, made suggestions to each other, and were appropriate through their interactions.     Recommendations:  It is recommended that Stacey Weaver continue considering all brought up by CSW and fellow patients throughout group and return to group at next scheduled time or to individual therapy at next scheduled  session.  Progress Towards Goals: Progressing   Encounter Diagnoses  Name Primary?   Bipolar I disorder, most recent episode depressed (HCC) Yes   Prolonged grief disorder    Generalized anxiety disorder      Virtual Visit via Video Note  I connected with Stacey Weaver on 06/08/23 at 5:00pm by [employer] and verified that I am speaking with the correct person using two identifiers.  Location: Patient: Home Provider: [facility] Outpatient Therapy Office - [facility]   I discussed the limitations of evaluation and management by telemedicine and the availability of in person appointments. The patient expressed understanding and agreed to proceed.   I discussed the assessment and treatment plan with the patient. The patient was provided an opportunity to ask questions and all were answered. The patient agreed with the plan and demonstrated an understanding of the instructions.   The patient was advised to call back or seek an in-person evaluation if the symptoms worsen or if the condition fails to improve as anticipated.  I provided 60 minutes of non-face-to-face time during this encounter.  Ambrose Mantle, LCSW 06/08/2023, 6:17 PM

## 2023-06-21 ENCOUNTER — Telehealth (HOSPITAL_COMMUNITY): Payer: Self-pay | Admitting: *Deleted

## 2023-06-21 NOTE — Telephone Encounter (Signed)
 Pt called requesting a refill of the Trazodone 300 mg total dose at bedtime prn. Pt was seen recently on 05/30/23 and has f/u scheduled for 07/06/23. Please review.

## 2023-06-22 ENCOUNTER — Encounter (HOSPITAL_COMMUNITY): Payer: Self-pay | Admitting: Clinical

## 2023-06-22 ENCOUNTER — Other Ambulatory Visit (HOSPITAL_COMMUNITY): Payer: Self-pay | Admitting: Student

## 2023-06-22 ENCOUNTER — Ambulatory Visit (HOSPITAL_COMMUNITY): Payer: PRIVATE HEALTH INSURANCE | Admitting: Clinical

## 2023-06-22 DIAGNOSIS — F4381 Prolonged grief disorder: Secondary | ICD-10-CM

## 2023-06-22 DIAGNOSIS — F411 Generalized anxiety disorder: Secondary | ICD-10-CM | POA: Diagnosis not present

## 2023-06-22 MED ORDER — TRAZODONE HCL 100 MG PO TABS: 300.0000 mg | ORAL_TABLET | Freq: Every evening | ORAL | 2 refills | Status: DC | PRN

## 2023-06-22 NOTE — Progress Notes (Unsigned)
 Therapy Group Progress Note   06/22/2023   Group Time:  5:00pm-6:00pm  Participation Level:  Active   Behavioral Response: Appropriate, Sharing, and Care-Taking   Type of Therapy: Group Therapy   Check-In:  Stacey Weaver shared that she has been having issues with intensified anxiety over her health and her brother's health.  They both ended up in the emergency room at different hospitals for different reasons on 07/09/2023.  Both of them had passed out for different health concerns.  She is concerned that she can just turn on and off her emotions.  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  She stated she is happiest when she is at work taking care of other people, but that also drains her so that she cannot take care of herself.  Others in the group definitely related to how she masks with smiles in front of other people.  Intervention(s):  Much support was rendered by the other group members.  The majority of the session was spent in mutual support, in fact.  CSW provided psychoeducation on the CBT model and how to challenge thoughts in order to result in better feelings.  This was well-received and all group members appeared to comprehend and appreciate this way of looking at their negative thoughts in a more beneficial manner.   Summary of Progress:  Stacey Weaver verbalized full understanding of concepts about CBT and cognitive distortions as presented.  Patients supported each other, made suggestions to each other, and were appropriate through their interactions.     Recommendations:  It is recommended that Stacey Weaver continue considering all brought up by CSW and fellow patients throughout group and return to group at next scheduled time or to individual therapy at next scheduled  session.  Progress Towards Goals: Progressing   Encounter Diagnoses  Name Primary?   Prolonged grief disorder Yes   Generalized anxiety disorder       Virtual Visit via Video Note  I connected with Stacey Weaver on 06/22/23 at 5:00pm by [employer] and verified that I am speaking with the correct person using two identifiers.  Location: Patient: Home Provider: [facility] Outpatient Therapy Office - [facility]   I discussed the limitations of evaluation and management by telemedicine and the availability of in person appointments. The patient expressed understanding and agreed to proceed.   I discussed the assessment and treatment plan with the patient. The patient was provided an opportunity to ask questions and all were answered. The patient agreed with the plan and demonstrated an understanding of the instructions.   The patient was advised to call back or seek an in-person evaluation if the symptoms worsen or if the condition fails to improve as anticipated.  I provided 60 minutes of non-face-to-face time during this encounter.  Ambrose Mantle, LCSW 06/22/2023, 5:36 PM

## 2023-06-22 NOTE — Telephone Encounter (Signed)
 Done, thank you!  Dr. Alfonse Flavors

## 2023-07-06 ENCOUNTER — Ambulatory Visit (HOSPITAL_BASED_OUTPATIENT_CLINIC_OR_DEPARTMENT_OTHER): Payer: PRIVATE HEALTH INSURANCE | Admitting: Student

## 2023-07-06 ENCOUNTER — Telehealth (HOSPITAL_COMMUNITY): Payer: Self-pay

## 2023-07-06 DIAGNOSIS — R4589 Other symptoms and signs involving emotional state: Secondary | ICD-10-CM | POA: Diagnosis not present

## 2023-07-06 DIAGNOSIS — F3130 Bipolar disorder, current episode depressed, mild or moderate severity, unspecified: Secondary | ICD-10-CM | POA: Diagnosis not present

## 2023-07-06 DIAGNOSIS — F4381 Prolonged grief disorder: Secondary | ICD-10-CM

## 2023-07-06 MED ORDER — PRAZOSIN HCL 1 MG PO CAPS: 1.0000 mg | ORAL_CAPSULE | Freq: Every day | ORAL | 1 refills | Status: DC

## 2023-07-06 MED ORDER — ZOLPIDEM TARTRATE ER 12.5 MG PO TBCR: 12.5000 mg | EXTENDED_RELEASE_TABLET | Freq: Every evening | ORAL | 1 refills | Status: DC | PRN

## 2023-07-06 MED ORDER — CARBAMAZEPINE ER 200 MG PO TB12: 200.0000 mg | ORAL_TABLET | Freq: Two times a day (BID) | ORAL | 1 refills | Status: DC

## 2023-07-06 MED ORDER — SERTRALINE HCL 100 MG PO TABS
150.0000 mg | ORAL_TABLET | Freq: Every day | ORAL | 1 refills | Status: DC
Start: 2023-07-06 — End: 2023-09-07

## 2023-07-06 NOTE — Progress Notes (Signed)
 BH MD Outpatient Progress Note  07/06/2023 10:05 AM  Stacey Weaver  MRN:  [id number]  Assessment:  Stacey Weaver presents for follow-up evaluation in-person. Today, 07/06/2023  , patient reports feeling worse overall due to an increase in stressors and resurgence of nightmares impacting sleep.  She has been in therapy, which she has found beneficial, but sometimes takes, she has not yet discussed with [place]. She plans to start group therapy with [facility] as well.  She denies problems with her medication regimen overall, but has almost run out of Ambien.  Since her medication regimen is with so many controlled substances, aim to target dual BZD. Will continue taper of Valium, switched from Klonopin.  And mood lability are primary concerns today, we will focus on mood stabilizing agents and hold on decreasing Valium dosage today.  Will also continue to taper Lamictal to cessation, maximizing Tegretol.  Will decrease Lamictal before Tegretol, as the latter is in inducer of UDP Glucuronyltransferase, which could significantly increase the availability of Lamictal if Tegretol were reduced first.  Patient is in agreement with this plan.  Patient denies SI, passive and active today.  She poses no safety concerns toward herself or others at this time.   Identifying Information: Stacey Weaver is a 48 y.o. female with a history of bipolar 1 disorder, difficulty coping, grief, and recent completion of PHP  who is an established patient with [facility] for medication management.   Risk Assessment: An assessment of suicide and violence risk factors was performed as part of this evaluation and is not significantly changed from the last visit.             While future psychiatric events cannot be accurately predicted, the patient does not currently require acute inpatient psychiatric care and does not currently meet [facility] involuntary commitment criteria.           Plan:  # Bipolar 1 Disorder Past medication trials:  Status of problem: Current episode depressed Interventions: -- Increase to Tegretol 400 mg  every AM and 200 mg  every night at bedtime for mood stabilization, starting on 4/17. Will continue this dose x 2 weeks, then increase to 400 mg twice daily on 5/1. -- Decrease to Lamictal 25 mg daily then discontinue, with final dose on 4/16.  -- Continue Zoloft 150 mg daily for depressive symptoms     # Difficulty coping #Other insomnia Past medication trials:  Status of problem: Ongoing Interventions: -- Continue prazosin 1 mg nightly for nightmares --Continue Ambien CR 12.5 mg nightly as needed for sleep -- Continue Valium 7 mg BID PRN anxiety.  -- Will continue trazodone prescription directions as prescribed (300 mg nightly as needed) but advised patient to not take trazodone unless she is unable to fall asleep after an hour of taking prazosin and Ambien. -- Patient to continue therapy with Marcell Barlow, LCSW    # Weight loss Past medication trials:  Status of problem: Managed by PCP Interventions: -- Patient currently taking phentermine 15 mg daily.    Return to care in 4-6 weeks  Patient was given contact information for behavioral health clinic and was instructed to call 911 for emergencies.    Patient and plan of care will be discussed with the Attending MD ,Dr. Mercy Riding, who agrees with the above statement and plan.   Subjective:  Chief Complaint:  Chief Complaint  Patient presents with   Anxiety   Depression   Stress   Follow-up  Medication Refill    Interval History: Patient reports things have been "worse and I feel like I'm backsliding." Patient has noted worsening depression and that she has had a syncopal episode. She was found to be dehydrated/ A friend passed away, and her nightmares have returned. Job is stressful.   She had 3 days off of work, and stayed in bed for those 3 days. Appetite was  decreased. First noticed shift in mood over past month, with worsening over past 2 weeks.   Brother has had medical issues as well. 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 She was written up at work during times she was physically ill and hospitalized after syncopal episode.   Difficulty staying focused.   Sometimes with SI. When she feels like she cannot do anything right. She has no active thoughts of harm but questions the point of living. Protective: Religion- will go to hell if she ends her life and will never see her mom and grandmother, as she believes they are in [place]. Denies HI, AVH.   She feels angry and out of control of her emotions. She has crying spells. Her mood has been more labile.   She is having nightmares again.   Sleep is becoming more affected a "hit or miss." Overall "okay." Poor sleep 2 days per night. On those nights, 4-5 broken sleep hours. Other nights, getting 7 hours of sleep. Wakes for 1-1.5 hours.   Takes Valium, on average, 3x per week, one dose per day. It has not been helpful for anxiety but not no longer makes her groggy.       Took Ambien last night and once last week. Taking once weekly, on average.  Denies active SI, endorses passive SI. This is chronic.   Insight that she does not have good coping mechanisms. In the past, engaged in hobbies, home decor.   She reports medication compliance. She reports taking Ambien less. Taking Klonopin on average 2x per day.     Visit Diagnosis:    ICD-10-CM   1. Bipolar I disorder, most recent episode depressed (HCC)  F31.30 sertraline (ZOLOFT) 100 MG tablet    2. Difficulty coping  R45.89 sertraline (ZOLOFT) 100 MG tablet    3. Prolonged grief disorder  F43.81 sertraline (ZOLOFT) 100 MG tablet         Past Psychiatric History:  Diagnoses: Bipolar 1 disorder, Medication trials: Haldol, Abilify (previously discontinued due to cost), Klonopin, Lamictal, Wellbutrin,  Prozac, Cymbalta (experienced muscle spasms and suicidal thoughts), Geodon and Zyprexa (experienced hallucinations), Navane Previous psychiatrist/therapist: [facility] with Dr. Clyde Canterbury as her outpatient psychiatrist Hospitalizations: [facility] in 2009, multiple times in 2010, 2012, 2017 Suicide attempts: 2012-overdose of Wellbutrin 150 mg; overdose in 2013 on Wellbutrin and Benadryl SIB: Denies Hx of violence towards others: Denies Current access to guns: Denies Hx of trauma/abuse: Denies  Past Medical History:  Past Medical History:  Diagnosis Date   Antihistamines overdose 08/08/2011   Anxiety    Asthma    Bipolar 1 disorder (HCC)    Depression 08/07/2011   MDD (major depressive disorder) 10/10/2015   Migraine    Severe episode of recurrent major depressive disorder, without psychotic features (HCC)    SSRI overdose 08/07/2011    Past Surgical History:  Procedure Laterality Date   KNEE SURGERY      Family Psychiatric History:   Family History:  Family History  Problem Relation Age of Onset  Hyperlipidemia Mother    Hypertension Mother    Heart disease Father 50       CAD   Hyperlipidemia Maternal Grandmother    Cancer Maternal Grandmother    Cancer Maternal Grandfather    Hyperlipidemia Brother     Social History:  Academic/Vocational:  Social History   Socioeconomic History   Marital status: Single    Spouse name: Not on file   Number of children: Not on file   Years of education: Not on file   Highest education level: Not on file  Occupational History   Not on file  Tobacco Use   Smoking status: Never   Smokeless tobacco: Never  Substance and Sexual Activity   Alcohol use: No   Drug use: No   Sexual activity: Yes    Birth control/protection: I.U.D.  Other Topics Concern   Not on file  Social History Narrative   Not on file   Social Drivers of Health   Financial Resource Strain: Not on  file  Food Insecurity: Medium Risk (11/04/2022)   Received from [facility]   Hunger Vital Sign    Worried About Running Out of Food in the Last Year: Sometimes true    Ran Out of Food in the Last Year: Sometimes true  Transportation Needs: Not on file (11/04/2022)  Physical Activity: Not on file  Stress: Not on file  Social Connections: Unknown (08/04/2021)   [facility]   Social Network    Social Network: Not on file    Allergies:  Allergies  Allergen Reactions   Almotriptan Malate Shortness Of Breath and Other (See Comments)    Muscle spasms   Cymbalta [Duloxetine Hcl] Other (See Comments)    Other reaction(s): Other Muscle spasms suicidal thoughts   Imitrex [Sumatriptan] Shortness Of Breath, Other (See Comments) and Palpitations    Muscle spasms   Prednisone Other (See Comments) and Palpitations   Zomig Shortness Of Breath   Geodon [Ziprasidone Hcl] Other (See Comments)    hallucinations   Lactose Intolerance (Gi) Diarrhea and Other (See Comments)    Gas and bloated    Penicillins Other (See Comments)    GI upset  Has patient had a PCN reaction causing immediate rash, facial/tongue/throat swelling, SOB or lightheadedness with hypotension: No Has patient had a PCN reaction causing severe rash involving mucus membranes or skin necrosis: No Has patient had a PCN reaction that required hospitalization No Has patient had a PCN reaction occurring within the last 10 years: Yes If all of the above answers are "NO", then may proceed with Cephalosporin use.    Zyprexa [Olanzapine] Other (See Comments)    Hallucinations     Current Medications: Current Outpatient Medications  Medication Sig Dispense Refill   atorvastatin (LIPITOR) 10 MG tablet Take 10 mg by mouth daily.     carbamazepine (TEGRETOL XR) 200 MG 12 hr tablet Take 1-2 tablets (200-400 mg total) by mouth 2 (two) times daily. Increase to 400 mg (2 tablets) every AM and 200 mg (1 tablet)  every night at bedtime for mood stabilization, starting on 4/17. Then increase to 400 mg twice daily on 5/1. 120 tablet 1   lamoTRIgine (LAMICTAL) 25 MG tablet Take 2 tablets (50 mg total) by mouth daily. 90 tablet 0   phentermine 15 MG capsule Take 15 mg by mouth daily.     prazosin (MINIPRESS) 1 MG capsule Take 1 capsule (1 mg total) by mouth at bedtime. 30 capsule 1  sertraline (ZOLOFT) 100 MG tablet Take 1.5 tablets (150 mg total) by mouth daily. 45 tablet 1   traZODone (DESYREL) 100 MG tablet Take 3 tablets (300 mg total) by mouth at bedtime as needed for sleep. 90 tablet 2   UBRELVY 100 MG TABS Take by mouth.     WEGOVY 0.5 MG/0.5ML SOAJ Inject 0.5 mg into the skin once a week.     zolpidem (AMBIEN CR) 12.5 MG CR tablet Take 1 tablet (12.5 mg total) by mouth at bedtime as needed for sleep. 30 tablet 1   No current facility-administered medications for this visit.    ROS: Review of Systems   Objective:  Psychiatric Specialty Exam: There were no vitals taken for this visit.There is no height or weight on file to calculate BMI.  Patient was crying throughout the entire assessment; held on obtaining today  General Appearance: Casual and Well Groomed  Eye Contact:  Good  Speech:  Clear and Coherent and Normal Rate  Volume:  Normal  Mood:  Depressed  Affect:  Full Range and Tearful  Thought Content: WDL and Logical   Suicidal Thoughts:  No  Homicidal Thoughts:  No  Thought Process:  Coherent, Goal Directed, and Linear  Orientation:  Full (Time, Place, and Person)    Memory: Immediate;   Good Recent;   Good Remote;   Fair  Judgment:  Fair  Insight:  Fair  Concentration:  Concentration: Good and Attention Span: Good  Recall: not formally assessed   Fund of Knowledge: Good  Language: Good  Psychomotor Activity:  Normal  Akathisia:  No  AIMS (if indicated): not done  Assets:  Communication Skills Desire for Improvement Financial  Resources/Insurance Housing Intimacy Leisure Time Physical Health Resilience Social Support Talents/Skills Transportation Vocational/Educational  ADL's:  Intact  Cognition: WNL  Sleep:  Poor   PE: General: well-appearing; no acute distress  Pulm: no increased work of breathing on room air  Strength & Muscle Tone: within normal limits Neuro: no focal neurological deficits observed  Gait & Station: normal  Metabolic Disorder Labs: No results found for: "HGBA1C", "MPG" No results found for: "PROLACTIN" Lab Results  Component Value Date   CHOL 250 (H) 01/29/2016   TRIG 87.0 01/29/2016   HDL 65.40 01/29/2016   CHOLHDL 4 01/29/2016   VLDL 17.4 01/29/2016   LDLCALC 167 (H) 01/29/2016   Lab Results  Component Value Date   TSH 1.36 06/30/2015   TSH 4.132 08/24/2011    Therapeutic Level Labs: No results found for: "LITHIUM" Lab Results  Component Value Date   VALPROATE 32.4 (L) 10/14/2009   VALPROATE 68.2 10/19/2008   Lab Results  Component Value Date   CBMZ 8.7 10/14/2015    Screenings: AIMS    Flowsheet Row [facility]  AIMS Total Score 0      AUDIT    Flowsheet Row [facility]  Alcohol Use Disorder Identification Test Final Score (AUDIT) 0      GAD-7    Flowsheet Row Counselor from 03/03/2023 in [facility]  Total GAD-7 Score 20      PHQ2-9    Flowsheet Row Counselor from 03/03/2023 in [facility] from 12/09/2022 in BEHAVIORAL HEALTH PARTIAL HOSPITALIZATION PROGRAM Office Visit from 02/12/2016 in [facility] Care at [facility] Visit from 06/30/2015 in Primary Care at [facility] Visit from  12/02/2014 in Primary Care at [facility] Total Score 6 6 0 2 0  PHQ-9 Total Score 21 20 -- 19 --      Flowsheet Row  Counselor from 03/03/2023 in [facility] at [facility] ED from 01/14/2023 in [facility] at [facility] Counselor from 12/09/2022 in BEHAVIORAL HEALTH PARTIAL HOSPITALIZATION PROGRAM  C-SSRS RISK CATEGORY Low Risk No Risk Low Risk       Collaboration of Care: Collaboration of Care: Dr. Mercy Riding  Patient/Guardian was advised Release of Information must be obtained prior to any record release in order to collaborate their care with an outside provider. Patient/Guardian was advised if they have not already done so to contact the registration department to sign all necessary forms in order for [place] to release information regarding their care.   Consent: Patient/Guardian gives verbal consent for treatment and assignment of benefits for services provided during this visit. Patient/Guardian expressed understanding and agreed to proceed.   Lamar Sprinkles, MD 07/06/2023 10:05 AM

## 2023-07-06 NOTE — Patient Instructions (Addendum)
--   Increase to Tegretol 400 mg  every AM and 200 mg  every night at bedtime for mood stabilization, starting on 4/17. Will continue this dose x 2 weeks, then increase to 400 mg twice daily on 5/1. -- Decrease to Lamictal 25 mg daily then discontinue, with final dose on 4/16.  -- Continue Zoloft 150 mg daily  Continue prazosin 1 mg nightly for nightmares --Continue Ambien CR 12.5 mg nightly as needed for sleep -- Continue Valium 7 mg BID PRN anxiety.

## 2023-07-06 NOTE — Telephone Encounter (Addendum)
 Hello,    Pt/Pharmacy is requesting for a refill of Zolpidem to be sent to pharmacy [facility] [place] [address]      JNL

## 2023-07-07 ENCOUNTER — Other Ambulatory Visit (HOSPITAL_COMMUNITY): Payer: Self-pay | Admitting: Student

## 2023-07-07 ENCOUNTER — Telehealth (HOSPITAL_COMMUNITY): Payer: Self-pay | Admitting: *Deleted

## 2023-07-07 MED ORDER — ZOLPIDEM TARTRATE ER 12.5 MG PO TBCR: 12.5000 mg | EXTENDED_RELEASE_TABLET | Freq: Every evening | ORAL | 1 refills | Status: DC | PRN

## 2023-07-07 NOTE — Telephone Encounter (Signed)
 Writer spoke with pt who called requesting that prescription for Ambien 12.5 mg tabs be sent to [facility] pharmacy and d/c script that was sent to [facility] pharmacy. Pt appreciates that it was sent to [facility] but would like to keep the same pharmacy so that there would be no issues getting this medication filled and for consistency sake. Thank you.

## 2023-07-07 NOTE — Telephone Encounter (Signed)
 Script sent to [facility] voided, and the script has been sent to [facility] as requested, thanks.

## 2023-07-09 NOTE — Addendum Note (Signed)
 Addended by: Donnelly Gainer on: 07/09/2023 12:11 PM   Modules accepted: Level of Service

## 2023-07-14 ENCOUNTER — Encounter (HOSPITAL_COMMUNITY): Payer: Self-pay | Admitting: Clinical

## 2023-07-14 ENCOUNTER — Ambulatory Visit (INDEPENDENT_AMBULATORY_CARE_PROVIDER_SITE_OTHER): Payer: PRIVATE HEALTH INSURANCE | Admitting: Clinical

## 2023-07-14 DIAGNOSIS — F4381 Prolonged grief disorder: Secondary | ICD-10-CM | POA: Diagnosis not present

## 2023-07-14 DIAGNOSIS — F3130 Bipolar disorder, current episode depressed, mild or moderate severity, unspecified: Secondary | ICD-10-CM

## 2023-07-14 DIAGNOSIS — F411 Generalized anxiety disorder: Secondary | ICD-10-CM

## 2023-07-14 NOTE — Progress Notes (Signed)
 THERAPIST PROGRESS NOTE  Session Time: 11:03am-12:03pm  Session #4  Virtual Visit via Video Note  I connected with Stacey Weaver on 07/14/23 at 11:00 AM EDT by a video enabled telemedicine application and verified that I am speaking with the correct person using two identifiers.  Location: Patient: home Provider: [facility] outpatient therapy office - [place]    I discussed the limitations of evaluation and management by telemedicine and the availability of in person appointments. The patient expressed understanding and agreed to proceed.   I discussed the assessment and treatment plan with the patient. The patient was provided an opportunity to ask questions and all were answered. The patient agreed with the plan and demonstrated an understanding of the instructions.   The patient was advised to call back or seek an in-person evaluation if the symptoms worsen or if the condition fails to improve as anticipated.  I provided 60 minutes of non-face-to-face time during this encounter.  Ancel Kass, LCSW   Participation Level: Active  Behavioral Response: Casual Alert Negative, Angry, and Depressed  Type of Therapy: Individual Therapy  Treatment Goals addressed:  LTG: Score less than 5 on the GAD-7 as evidenced by intermittent administration of the questionnaire to determine progress in management of anxiety.   STG: Cecilee will practice problem solving skills 3 times per week for the next 4 weeks. LTG: Learn about the feeling of anxiety and its many variations, the cycle of anxiety and how to interrupt that cycle.   STG: Learn breathing techniques and grounding techniques at an age-appropriate level and demonstrate mastery in session then report independent use of these skills out of session.   LTG: Reduce frequency, intensity, and duration of depression symptoms so that daily functioning is improved STG: Score less than 9 on the Patient Health Questionnaire (PHQ-9)  as evidenced by intermittent administration of the questionnaire to determine progress.   STG: Identify and decrease cognitive distortions contributing negatively to mood and behavior by identifying 5-7 cognitive distortions patient has and learning how to come up with replacement thoughts that are more balanced, realistic, and helpful.   LTG: Work to Arts development officer from models like CBT, Stages of Change, DBT, shame resilience theory, ACT, SFBT, MI, trauma-informed therapy and others to be able to manage mental health symptoms, AEB practicing out of session and reporting back.   STG: Patient will work on forgiveness, shame, sleep, relationship to food, or other issues as appropriate and as such issues present during sessions   LTG: Learn and practice communication techniques such as "I" statements, open-ended questions, reflective listening, assertiveness, fair fighting rules, initiating conversations, and more as necessary and taught in session   LTG: Recall traumatic events without becoming overwhelmed with negative emotions STG: Adelae will practice emotion regulation skills, distress tolerance skills, mindfulness skills, and interpersonal effectiveness skills weekly for the next 26 week(s) STG: Report a decrease in PTSD symptoms as evidenced by a 25% reduction in overall score on a clinician administered PTSD assessment screen/scale STG: Learn about boundary types, how to implement them, and how to enforce them so that patient feels more empowered and content with being able to maintain more helpful, appropriate boundaries in the future for a more balanced result.    ProgressTowards Goals: Progressing  Interventions: DBT, Supportive, Anger Management Training, and Other: shame  Summary: Stacey Weaver is a 48 y.o. female who presents with prolonged grief and Bipolar 1 disorder. She presented oriented x5 and stated she was feeling "horrible because  the nightmares have been coming back."   CSW evaluated patient's medication compliance, use of coping tools, and self-care, as applicable.  She provided an update on various aspects of her life that are normally discussed in therapy, including her increased anger outbursts, taking FMLA time from work, sleep problems, worsening anxiety, brain fox, and need to have a heart monitor for a period of time.  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  CSW introduced the idea of states of mind from DBT (emotional/rational/wise) and then went through TIPP skills as a means of rapidly getting out of emotional mind, since that is where she is spending a lot of uncomfortable time these days.  After CSW later talked with her about types of shame associated with anger, she identified 4 that to some degree she possesses:  guilt-induced, attachment, impotent, and survival.  These were explored and she expressed shame that she has her whole life has been easy to anger, then she "conquered" it, and now "it is back."  We practiced 4-7-8 breathing and CSW was able to demonstrate to her that this practice reduced the number of thoughts she had in a minute.  Suicidal/Homicidal: No without intent/plan  Therapist Response:  Patient is progressing AEB engaging in scheduled therapy session.  Throughout the session, CSW gave patient the opportunity to explore thoughts and feelings associated with current life situations and past/present stressors.   CSW challenged patient gently and appropriately to consider different ways of looking at reported issues. CSW encouraged patient's expression of feelings and validated these using empathy, active listening, open body language, and unconditional positive regard.   CSW encouraged patient to schedule more therapy sessions for the future, as needed.   Plan/Recommendations: Return again  at earliest available appointment, consider groups in the meantime, try TIPP skills and report back how they worked especially with anger and nightmares  Diagnosis:  Generalized anxiety disorder  Bipolar I disorder, most recent episode depressed (HCC)  Prolonged grief disorder  Collaboration of Care: Psychiatrist AEB - psychiatrist can read therapy notes; therapist can and does read psychiatric notes prior to sessions   Patient/Guardian was advised Release of Information must be obtained prior to any record release in order to collaborate their care with an outside provider. Patient/Guardian was advised if they have not already done so to contact the registration department to sign all necessary forms in order for us  to release information regarding their care.   Consent: Patient/Guardian gives verbal consent for treatment and assignment of benefits for services provided during this visit. Patient/Guardian expressed understanding and agreed to proceed.   Ancel Kass, LCSW 07/14/2023

## 2023-07-28 ENCOUNTER — Ambulatory Visit (HOSPITAL_COMMUNITY): Payer: PRIVATE HEALTH INSURANCE | Admitting: Clinical

## 2023-08-03 ENCOUNTER — Ambulatory Visit (HOSPITAL_COMMUNITY): Payer: PRIVATE HEALTH INSURANCE | Admitting: Student

## 2023-08-10 ENCOUNTER — Ambulatory Visit (HOSPITAL_BASED_OUTPATIENT_CLINIC_OR_DEPARTMENT_OTHER): Payer: PRIVATE HEALTH INSURANCE | Admitting: Student

## 2023-08-10 VITALS — BP 109/72 | HR 61

## 2023-08-10 DIAGNOSIS — F3130 Bipolar disorder, current episode depressed, mild or moderate severity, unspecified: Secondary | ICD-10-CM | POA: Diagnosis not present

## 2023-08-10 DIAGNOSIS — F411 Generalized anxiety disorder: Secondary | ICD-10-CM

## 2023-08-10 DIAGNOSIS — R4589 Other symptoms and signs involving emotional state: Secondary | ICD-10-CM | POA: Diagnosis not present

## 2023-08-10 DIAGNOSIS — F4381 Prolonged grief disorder: Secondary | ICD-10-CM

## 2023-08-10 MED ORDER — ZOLPIDEM TARTRATE ER 6.25 MG PO TBCR: 6.2500 mg | EXTENDED_RELEASE_TABLET | Freq: Every evening | ORAL | 1 refills | Status: DC | PRN

## 2023-08-10 MED ORDER — PRAZOSIN HCL 2 MG PO CAPS: 2.0000 mg | ORAL_CAPSULE | Freq: Every day | ORAL | 1 refills | Status: DC

## 2023-08-10 NOTE — Progress Notes (Signed)
 BH MD Outpatient Progress Note  08/10/2023 1:56 PM  Stacey Weaver  MRN:  [id number]  Assessment:  [name] presents for follow-up evaluation in-person. Today,  patient reports worsening overall due to an increase in stressors and resurgence of nightmares impacting sleep, as reported during previous visit.  She is receptive to discussion that her nightmares typically occur when she is constantly thinking about and preparing for a nightmare.  In therapy, she has been working on the stress management techniques and developing a nighttime routine, which she notices is beneficial when practicing consistently.  She does believe that she can continue to put those actions into play consistently to assess how effective they are managing her nightmares.  She denies problems with her medication regimen overall, but she does inquire about increasing prazosin  and, as it has been beneficial for her nightmares.  We discuss increasing depressive and while decreasing Ambien , and she is agreeable.  We will also formally decrease her Valium  dosage to 5 mg twice daily as needed, as she only takes 5 mg when needed.  We will continue to target dual BZD and efforts to reduce her controlled substance burden.   No changes were noted in discontinuing his Lamictal  and titrating Tegretol .  Sustained improvements to previous mood lability.  Will not make changes to her Tegretol  today.  Patient is in agreement with this plan.  Patient does report some resurgence of passive SI, but denies active SI.  She notes her brother as well as the desire to reunite with mother and grandmother in the afterlife as protective factors.  She poses no significant safety concerns toward herself or others at this time.   Identifying Information: Stacey Weaver is a 48 y.o. female with a history of bipolar 1 disorder, difficulty coping, grief, and recent completion of PHP  who is an established patient with [facility] for medication management.   Risk Assessment: An assessment of suicide and violence risk factors was performed as part of this evaluation and is not significantly changed from the last visit.             While future psychiatric events cannot be accurately predicted, the patient does not currently require acute inpatient psychiatric care and does not currently meet [place]  involuntary commitment criteria.          Plan:  # Bipolar 1 Disorder Past medication trials:  Status of problem: Current episode depressed Interventions: --Continue Tegretol  400 mg BID for mood stabilization -- Continue Zoloft  150 mg daily for depressive symptoms     # Difficulty coping #Other insomnia Past medication trials:  Status of problem: Ongoing Interventions: -- Increase to prazosin  2 mg nightly for nightmares -- Decrease to Ambien  CR 6.25 mg nightly as needed for sleep -- Decrease to Valium  5 mg BID PRN anxiety.  -- Will continue trazodone  prescription directions as prescribed (300 mg nightly as needed for insomnia). -- Patient to continue therapy with Mareida Grossman, LCSW    # Weight loss Past medication trials:  Status of problem: Managed by PCP Interventions: -- Patient currently prescribed phentermine 15 mg daily.  She takes it every other day, on average --Patient also prescribed Wegovy by PCP    Return to care in 4-6 weeks  Patient was given contact information for behavioral health clinic and was instructed to call 911 for emergencies.    Patient and plan of care will be discussed with the Attending MD ,Dr. Sharalyn Dasen, who agrees with the  above statement and plan.   Subjective:  Chief Complaint:  No chief complaint on file.   Interval History: Patient reports things have been some better overall, but "feels as though she is cycling." Work is a stressor for her, and her anxiety is not well managed. Her anxiety is worsened.   Her nightmares are more  consistent. She is afraid to go to sleep due to the return of them. She has been practicing the techniques disucssed with therapist, 2 nights, on average.   Recently started metoprolol for ventricular tachycardia noticed on Holter monitor.  She is worried about her health.    D/c Lamictal . Tegretol  400 mg and 200 mg. Valium  5 mg once per day maybe once a week.  She has noticed no acute worsening of her mood with discontinuation of Lamictal .  Taking phentermine every other day, and not on weekends. Wegovy on Sundays.   Takes Trazodone  nightly but the Ambien  maybe 2 nights per week.   Appetite curbed with medications. She does make herself eat.   Chronic SI; worse over the past couple of weeks in terms of intensity and frequency. Triggered by feeling overwhelmed and having nightmares. Protective: Religion- will go to hell if she ends her life and will never see her mom and grandmother, as she believes they are in [place]. Also, her brother is a protective factor. Denies HI, AVH.     Visit Diagnosis:  No diagnosis found.      Past Psychiatric History:  Diagnoses: Bipolar 1 disorder, Medication trials: Haldol, Abilify  (previously discontinued due to cost), Klonopin , Lamictal , Wellbutrin , Prozac , Cymbalta (experienced muscle spasms and suicidal thoughts), Geodon and Zyprexa (experienced hallucinations), Navane  Previous psychiatrist/therapist: [facility] with Dr. Phares Brasher as her outpatient psychiatrist Hospitalizations: [facility] in 2009, multiple times in 2010, 2012, 2017 Suicide attempts: 2012-overdose of Wellbutrin  150 mg; overdose in 2013 on Wellbutrin  and Benadryl  SIB: Denies Hx of violence towards others: Denies Current access to guns: Denies Hx of trauma/abuse: Denies  Past Medical History:  Past Medical History:  Diagnosis Date   Antihistamines overdose 08/08/2011   Anxiety    Asthma    Bipolar 1 disorder (HCC)     Depression 08/07/2011   MDD (major depressive disorder) 10/10/2015   Migraine    Severe episode of recurrent major depressive disorder, without psychotic features (HCC)    SSRI overdose 08/07/2011    Past Surgical History:  Procedure Laterality Date   KNEE SURGERY      Family Psychiatric History:   Family History:  Family History  Problem Relation Age of Onset   Hyperlipidemia Mother    Hypertension Mother    Heart disease Father 20       CAD   Hyperlipidemia Maternal Grandmother    Cancer Maternal Grandmother    Cancer Maternal Grandfather    Hyperlipidemia Brother     Social History:  Academic/Vocational:  Social History   Socioeconomic History   Marital status: Single    Spouse name: Not on file   Number of children: Not on file   Years of education: Not on file   Highest education level: Not on file  Occupational History   Not on file  Tobacco Use   Smoking status: Never   Smokeless tobacco: Never  Substance and Sexual Activity   Alcohol use: No   Drug use: No   Sexual activity: Yes    Birth control/protection: I.U.D.  Other Topics Concern   Not on file  Social History Narrative   Not on file   Social Drivers of Health   Financial Resource Strain: Not on file  Food Insecurity: Medium Risk (11/04/2022)   Received from [facility]   Hunger Vital Sign    Worried About Running Out of Food in the Last Year: Sometimes true    Ran Out of Food in the Last Year: Sometimes true  Transportation Needs: Not on file (11/04/2022)  Physical Activity: Not on file  Stress: Not on file  Social Connections: Unknown (08/04/2021)   [facility]   Social Network    Social Network: Not on file    Allergies:  Allergies  Allergen Reactions   Almotriptan Malate Shortness Of Breath and Other (See Comments)    Muscle spasms   Cymbalta [Duloxetine Hcl] Other (See Comments)    Other reaction(s): Other Muscle spasms suicidal thoughts    Imitrex [Sumatriptan] Shortness Of Breath, Other (See Comments) and Palpitations    Muscle spasms   Prednisone Other (See Comments) and Palpitations   Zomig Shortness Of Breath   Geodon [Ziprasidone Hcl] Other (See Comments)    hallucinations   Lactose Intolerance (Gi) Diarrhea and Other (See Comments)    Gas and bloated    Penicillins Other (See Comments)    GI upset  Has patient had a PCN reaction causing immediate rash, facial/tongue/throat swelling, SOB or lightheadedness with hypotension: No Has patient had a PCN reaction causing severe rash involving mucus membranes or skin necrosis: No Has patient had a PCN reaction that required hospitalization No Has patient had a PCN reaction occurring within the last 10 years: Yes If all of the above answers are "NO", then may proceed with Cephalosporin use.    Zyprexa [Olanzapine] Other (See Comments)    Hallucinations     Current Medications: Current Outpatient Medications  Medication Sig Dispense Refill   atorvastatin (LIPITOR) 10 MG tablet Take 10 mg by mouth daily.     carbamazepine  (TEGRETOL  XR) 200 MG 12 hr tablet Take 1-2 tablets (200-400 mg total) by mouth 2 (two) times daily. Increase to 400 mg (2 tablets) every AM and 200 mg (1 tablet) every night at bedtime for mood stabilization, starting on 4/17. Then increase to 400 mg twice daily on 5/1. 120 tablet 1   lamoTRIgine  (LAMICTAL ) 25 MG tablet Take 2 tablets (50 mg total) by mouth daily. 90 tablet 0   phentermine 15 MG capsule Take 15 mg by mouth daily.     prazosin  (MINIPRESS ) 1 MG capsule Take 1 capsule (1 mg total) by mouth at bedtime. 30 capsule 1   sertraline  (ZOLOFT ) 100 MG tablet Take 1.5 tablets (150 mg total) by mouth daily. 45 tablet 1   traZODone  (DESYREL ) 100 MG tablet Take 3 tablets (300 mg total) by mouth at bedtime as needed for sleep. 90 tablet 2   UBRELVY 100 MG TABS Take by mouth.     WEGOVY 0.5 MG/0.5ML SOAJ Inject 0.5 mg into the skin once a week.     zolpidem   (AMBIEN  CR) 12.5 MG CR tablet Take 1 tablet (12.5 mg total) by mouth at bedtime as needed for sleep. 30 tablet 1   No current facility-administered medications for this visit.    ROS: Review of Systems   Objective:  Psychiatric Specialty Exam: There were no vitals taken for this visit.There is no height or weight on file to calculate BMI.    General Appearance: Casual and Well Groomed  Eye Contact:  Good  Speech:  Clear and Coherent and Normal Rate  Volume:  Normal  Mood:  Depressed  Affect:  Full Range and Tearful  Thought Content: WDL and Logical   Suicidal Thoughts:  No  Homicidal Thoughts:  No  Thought Process:  Coherent, Goal Directed, and Linear  Orientation:  Full (Time, Place, and Person)    Memory: Immediate;   Good Recent;   Good Remote;   Fair  Judgment:  Fair  Insight:  Fair  Concentration:  Concentration: Good and Attention Span: Good  Recall: not formally assessed   Fund of Knowledge: Good  Language: Good  Psychomotor Activity:  Normal  Akathisia:  No  AIMS (if indicated): not done  Assets:  Communication Skills Desire for Improvement Financial Resources/Insurance Housing Intimacy Leisure Time Physical Health Resilience Social Support Talents/Skills Transportation Vocational/Educational  ADL's:  Intact  Cognition: WNL  Sleep:  Poor   PE: General: well-appearing; no acute distress  Pulm: no increased work of breathing on room air  Strength & Muscle Tone: within normal limits Neuro: no focal neurological deficits observed  Gait & Station: normal  Metabolic Disorder Labs: No results found for: "HGBA1C", "MPG" No results found for: "PROLACTIN" Lab Results  Component Value Date   CHOL 250 (H) 01/29/2016   TRIG 87.0 01/29/2016   HDL 65.40 01/29/2016   CHOLHDL 4 01/29/2016   VLDL 17.4 01/29/2016   LDLCALC 167 (H) 01/29/2016   Lab Results  Component Value Date   TSH 1.36 06/30/2015   TSH 4.132 08/24/2011    Therapeutic Level Labs: No  results found for: "LITHIUM" Lab Results  Component Value Date   VALPROATE 32.4 (L) 10/14/2009   VALPROATE 68.2 10/19/2008   Lab Results  Component Value Date   CBMZ 8.7 10/14/2015    Screenings: AIMS    Flowsheet Row [facility]  AIMS Total Score 0      AUDIT    Flowsheet Row [facility]  Alcohol Use Disorder Identification Test Final Score (AUDIT) 0      GAD-7    Flowsheet Row Counselor from 03/03/2023 in [facility]  Total GAD-7 Score 20      PHQ2-9    Flowsheet Row Counselor from 03/03/2023 in [facility] [place] Counselor from 12/09/2022 in [facility] Office Visit from 02/12/2016 in Primary Care at [place] Office Visit from 06/30/2015 in Primary Care at [facility] Visit from 12/02/2014 in [facility] Care at [facility] Total Score 6 6 0 2 0  PHQ-9 Total Score 21 20 -- 19 --      Flowsheet Row Counselor from 03/03/2023 in [facility] ED from 01/14/2023 in [facility] at [facility] Counselor from 12/09/2022 in BEHAV[facility]AL [facility]ALTH PARTIAL HOSPITALIZATION PROGRAM  C-SSRS RISK CATEGORY Low Risk No Risk Low Risk       Collaboration of Care: Collaboration of Care: Dr. Sharalyn Dasen  Patient/Guardian was advised Release of Information must be obtained prior to any record release in order to collaborate their care with an outside provider. Patient/Guardian was advised if they have not already done so to contact the registration department to sign all necessary forms in order for us  to release information regarding their care.   Consent: Patient/Guardian gives verbal consent for treatment and assignment of benefits for services provided during this  visit. Patient/Guardian expressed understanding and agreed to proceed.   Shery Done, MD 08/10/2023 1:56 PM

## 2023-08-11 ENCOUNTER — Ambulatory Visit (HOSPITAL_COMMUNITY): Payer: PRIVATE HEALTH INSURANCE | Admitting: Clinical

## 2023-08-16 NOTE — Addendum Note (Signed)
 Addended by: Donnelly Gainer on: 08/16/2023 12:56 PM   Modules accepted: Level of Service

## 2023-08-18 ENCOUNTER — Ambulatory Visit (HOSPITAL_COMMUNITY): Payer: PRIVATE HEALTH INSURANCE | Admitting: Clinical

## 2023-09-07 ENCOUNTER — Ambulatory Visit (HOSPITAL_BASED_OUTPATIENT_CLINIC_OR_DEPARTMENT_OTHER): Payer: PRIVATE HEALTH INSURANCE | Admitting: Student

## 2023-09-07 DIAGNOSIS — F3130 Bipolar disorder, current episode depressed, mild or moderate severity, unspecified: Secondary | ICD-10-CM | POA: Diagnosis not present

## 2023-09-07 DIAGNOSIS — F4381 Prolonged grief disorder: Secondary | ICD-10-CM

## 2023-09-07 DIAGNOSIS — R4589 Other symptoms and signs involving emotional state: Secondary | ICD-10-CM | POA: Diagnosis not present

## 2023-09-07 DIAGNOSIS — G4709 Other insomnia: Secondary | ICD-10-CM

## 2023-09-07 DIAGNOSIS — F411 Generalized anxiety disorder: Secondary | ICD-10-CM

## 2023-09-07 MED ORDER — ZOLPIDEM TARTRATE ER 6.25 MG PO TBCR: 6.2500 mg | EXTENDED_RELEASE_TABLET | Freq: Every evening | ORAL | 0 refills | Status: DC | PRN

## 2023-09-07 MED ORDER — CARBAMAZEPINE ER 400 MG PO TB12: 400.0000 mg | ORAL_TABLET | Freq: Two times a day (BID) | ORAL | 1 refills | Status: DC

## 2023-09-07 MED ORDER — TRAZODONE HCL 100 MG PO TABS
300.0000 mg | ORAL_TABLET | Freq: Every evening | ORAL | 2 refills | Status: DC | PRN
Start: 2023-09-07 — End: 2023-10-24

## 2023-09-07 MED ORDER — PRAZOSIN HCL 2 MG PO CAPS
2.0000 mg | ORAL_CAPSULE | Freq: Every day | ORAL | 1 refills | Status: DC
Start: 2023-09-07 — End: 2023-11-14

## 2023-09-07 MED ORDER — SERTRALINE HCL 100 MG PO TABS: 150.0000 mg | ORAL_TABLET | Freq: Every day | ORAL | 1 refills | Status: DC
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
## 2023-09-09 ENCOUNTER — Encounter (HOSPITAL_COMMUNITY): Payer: Self-pay | Admitting: Clinical

## 2023-09-09 ENCOUNTER — Ambulatory Visit (INDEPENDENT_AMBULATORY_CARE_PROVIDER_SITE_OTHER): Payer: PRIVATE HEALTH INSURANCE | Admitting: Clinical

## 2023-09-09 DIAGNOSIS — F4381 Prolonged grief disorder: Secondary | ICD-10-CM | POA: Diagnosis not present

## 2023-09-09 DIAGNOSIS — F3130 Bipolar disorder, current episode depressed, mild or moderate severity, unspecified: Secondary | ICD-10-CM | POA: Diagnosis not present

## 2023-09-09 DIAGNOSIS — F411 Generalized anxiety disorder: Secondary | ICD-10-CM | POA: Diagnosis not present
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
## 2023-09-19 NOTE — Addendum Note (Signed)
 Addended by: CARVIN CROCK on: 09/19/2023 08:15 AM   Modules accepted: Level of Service

## 2023-10-14 ENCOUNTER — Ambulatory Visit (HOSPITAL_COMMUNITY): Payer: PRIVATE HEALTH INSURANCE | Admitting: Clinical

## 2023-10-17 ENCOUNTER — Ambulatory Visit (HOSPITAL_COMMUNITY): Payer: PRIVATE HEALTH INSURANCE | Admitting: Student in an Organized Health Care Education/Training Program

## 2023-10-19 ENCOUNTER — Ambulatory Visit (HOSPITAL_COMMUNITY): Payer: PRIVATE HEALTH INSURANCE | Admitting: Student in an Organized Health Care Education/Training Program

## 2023-10-21 NOTE — Progress Notes (Cosign Needed Addendum)
 BH MD Outpatient Progress Note  10/24/2023 5:20 PM Emberlyn Mckynleigh Mussell  MRN:  [id number]  Assessment:  [name] presents for follow-up evaluation in-person on 10/24/23 . She is formerly a patient of Dr. Rainelle, last seen 09/07/2023.  This is a woman with a history of recurrent depression and anxiety who presents with worsening symptoms in the context of anniversary-related grief, particularly surrounding the upcoming anniversary of her mother's passing in August. She identifies a clear seasonal and emotional pattern to her decompensation, with significant ruminations, increased irritability, social withdrawal, and passive suicidal ideation characterized by a wish to join her mother. Despite having some supportive figures (mother's best friend and brother), she has not reached out to them and describes herself as highly private and resistant to group-based grief counseling. However, she remains amenable to increasing individual therapy frequency, which is a strength in her current coping.  She denies adverse effects to her medications but no longer finds benefit from Ambien , we have discontinued it at this visit.. She uses Valium  infrequently but is amenable to restarting it temporarily during this grief-sensitive period. She reports Trazodone  as the most helpful medication and agrees to titrate her Sertraline  from 150 mg to 200 mg to help manage her worsening symptoms. Passive SI is present, though without active plan, intent, or access to firearms. A safety assessment was completed and risk appears low with appropriate follow-up and insight.  Moving forward, depression appears to have transitioned from partial remission to active symptoms, with prominent grief overlay. GAD symptoms are also exacerbated by emotional stress and impaired sleep. Plan includes increasing Sertraline , discontinuing Ambien , resuming Valium  on a temporary basis, and increasing therapy frequency.   Agree with Dr.  Wende prior assessment, the patient's presentation is consistent with Prolonged Grief Disorder, characterized by persistent and intense emotional distress related to the loss of her mother over four years ago. She reports recurrent yearning, anger, and sadness, particularly surrounding the anniversary of her mother's death, as well as significant social withdrawal and impaired daily functioning. Her symptoms have remained active well beyond 12 months, occur most days, and are associated with marked distress.   Identifying Information: Vaniya Blaize Nipper is a 48 y.o. female with a history of Bipolar 1 disorder, insomnia, GAD, prolonged grief disorder who is an established patient with [facility] for management of mood and insomnia.  For a comprehensive history and detailed assessment, please refer to the initial adult assessment.  Sees Grossman-Orr, Elgie PARAS, LCSW for therapy   Plan:  # Bipolar 1 Disorder #GAD Past medication trials:  Status of problem: Current episode depressed Interventions: -- Continue Tegretol  400 mg BID for mood stabilization -- Increase Zoloft  150 mg to 200 mg daily to better target depression and anxiety     # Prolonged grief disorder #Other insomnia Past medication trials:  Status of problem: Ongoing Interventions: -- Encourage increased frequency of individual therapy,  -- Continue prazosin  2 mg nightly for nightmares, 30 days, 0 refills -- Discontinue Ambien  CR 6.25 mg due to futility -- Restart Valium  2 mg at bedtime PRN  PDMP reviewed on 10/24/23  -- Continue trazodone  300 mg nightly,  EKG from June 2024 reviewed showing NSR QTc 403     Health maintenance, PCP: Douglass Sayres Hancock, PA-C  Weight loss --phentermine 50 mg daily, Wegovy  Patient was given contact information for behavioral health clinic and was instructed to call 911 for emergencies.   Subjective:  Chief Complaint:  Chief Complaint  Patient presents  with    Follow-up   Other    Prolonged grief   Depression   Anxiety    Interval History:  The patient presents reporting "terrible" anxiety and depression, which she attributes to the upcoming anniversary of her mother's passing in August. It has been four years since her mother died, and she notes that each year around this time she experiences worsening symptoms, although she feels this year has been particularly difficult. She describes intense rumination, feeling angry and resentful about her mother's death, stating, "she should still be here with me right now, this is not fair." She also endorses increased irritability and emotional reactivity, reporting that "anything can set me off," which has led to progressive social withdrawal. She has not reached out to her limited support network, which includes her mother's best friend and her brother. She denies any other social supports or community involvement.  She describes this anniversary-related depression as peaking around her mother's birthday, followed by a brief sense of relief once the date passes. She has previously attempted group grief counseling but found the experience distressing and unhelpful, particularly when it was offered in the hospice setting after her grandmother's death. She is not open to group therapy at this time, describing herself as a private person. However, she is amenable to discussing an increased frequency of her individual therapy sessions for added support.  Regarding her current psychotropic regimen, she reports that Trazodone  remains helpful for sleep, but she has stopped taking Ambien , noting no difference in benefit. She has not used Valium  in recent weeks but is open to restarting it temporarily to help manage anxiety around the anniversary. She is agreeable to titrating Zoloft  from 150 mg to 200 mg.  Sleep: ~4 hours per night, reports difficulty both falling and staying asleep. Appetite: Variable; she often lacks  hunger and has been eating less. Attempts to supplement with protein shakes. Caffeine: Reports 1-2 cups of coffee daily. Recent substance use: Denies. SI: Endorses passive suicidal ideation with thoughts of wanting to be with her mother, but denies intent, plan, or access to firearms. Safety assessment completed.   Visit Diagnosis:    ICD-10-CM   1. Bipolar I disorder, most recent episode depressed (HCC)  F31.30 sertraline  (ZOLOFT ) 100 MG tablet    2. Difficulty coping  R45.89 sertraline  (ZOLOFT ) 100 MG tablet    3. Prolonged grief disorder  F43.81 sertraline  (ZOLOFT ) 100 MG tablet    4. Generalized anxiety disorder  F41.1 sertraline  (ZOLOFT ) 100 MG tablet    5. Other insomnia  G47.09 traZODone  (DESYREL ) 100 MG tablet    diazepam  (VALIUM ) 2 MG tablet    DISCONTINUED: diazepam  (VALIUM ) 2 MG tablet      Past Psychiatric History:  Diagnoses: Bipolar 1 disorder, Medication trials: Haldol, Abilify  (previously discontinued due to cost), Klonopin , Lamictal , Wellbutrin , Prozac , Cymbalta (experienced muscle spasms and suicidal thoughts), Geodon and Zyprexa (experienced hallucinations), Navane  Previous psychiatrist/therapist: [facility] with Dr. Donn as her outpatient psychiatrist Hospitalizations: [facility] in 2009, multiple times in 2010, 2012, 2017 Suicide attempts: 2012-overdose of Wellbutrin  150 mg; overdose in 2013 on Wellbutrin  and Benadryl  SIB: Denies Hx of violence towards others: Denies Current access to guns: Denies Hx of trauma/abuse: Denies    Social History   Socioeconomic History   Marital status: Single    Spouse name: Not on file   Number of children: Not on file   Years of education: Not on file   Highest education  level: Not on file  Occupational History   Not on file  Tobacco Use   Smoking status: Never   Smokeless tobacco: Never  Substance and Sexual Activity   Alcohol use: No   Drug use: No    Sexual activity: Yes    Birth control/protection: I.U.D.  Other Topics Concern   Not on file  Social History Narrative   Not on file   Social Drivers of Health   Financial Resource Strain: Not on file  Food Insecurity: Medium Risk (08/10/2023)   Received from [facility]   Hunger Vital Sign    Within the past 12 months, you worried that your food would run out before you got money to buy more: Never true    Within the past 12 months, the food you bought just didn't last and you didn't have money to get more. : Sometimes true  Transportation Needs: Not on file (11/04/2022)  Physical Activity: Not on file  Stress: Not on file  Social Connections: Unknown (08/04/2021)   Received from [facility]   Social Network    Social Network: Not on file    Allergies:  Allergies  Allergen Reactions   Almotriptan Malate Shortness Of Breath and Other (See Comments)    Muscle spasms   Cymbalta [Duloxetine Hcl] Other (See Comments)    Other reaction(s): Other Muscle spasms suicidal thoughts   Imitrex [Sumatriptan] Shortness Of Breath, Other (See Comments) and Palpitations    Muscle spasms   Prednisone Other (See Comments) and Palpitations   Zomig Shortness Of Breath   Geodon [Ziprasidone Hcl] Other (See Comments)    hallucinations   Lactose Intolerance (Gi) Diarrhea and Other (See Comments)    Gas and bloated    Penicillins Other (See Comments)    GI upset  Has patient had a PCN reaction causing immediate rash, facial/tongue/throat swelling, SOB or lightheadedness with hypotension: No Has patient had a PCN reaction causing severe rash involving mucus membranes or skin necrosis: No Has patient had a PCN reaction that required hospitalization No Has patient had a PCN reaction occurring within the last 10 years: Yes If all of the above answers are NO, then may proceed with Cephalosporin use.    Zyprexa [Olanzapine] Other (See Comments)    Hallucinations     Current  Medications: Current Outpatient Medications  Medication Sig Dispense Refill   atorvastatin (LIPITOR) 10 MG tablet Take 10 mg by mouth daily.     carbamazepine  (TEGRETOL  XR) 400 MG 12 hr tablet Take 1 tablet (400 mg total) by mouth 2 (two) times daily. 60 tablet 1   diazepam  (VALIUM ) 2 MG tablet Take 1 tablet (2 mg total) by mouth at bedtime as needed for anxiety. 30 tablet 0   Erenumab-aooe (AIMOVIG) 70 MG/ML SOAJ Inject 70 mg/mL as directed every 30 (thirty) days.     metoprolol succinate (TOPROL-XL) 25 MG 24 hr tablet Take 1 tablet by mouth daily.     phentermine 15 MG capsule Take 15 mg by mouth daily.     potassium chloride  (KLOR-CON ) 10 MEQ tablet Take 10 mEq by mouth daily.     prazosin  (MINIPRESS ) 2 MG capsule Take 1 capsule (2 mg total) by mouth at bedtime. 30 capsule 1   [START ON 10/31/2023] sertraline  (ZOLOFT ) 100 MG tablet Take 2 tablets (200 mg total) by mouth daily. 60 tablet 2   traZODone  (DESYREL ) 100 MG tablet Take 3 tablets (300 mg total) by mouth at bedtime as needed for sleep. 90 tablet  2   UBRELVY 100 MG TABS Take by mouth.     WEGOVY 0.5 MG/0.5ML SOAJ Inject 0.5 mg into the skin once a week.     No current facility-administered medications for this visit.    ROS: Review of Systems  All other systems reviewed and are negative.   Objective:  Objective: Psychiatric Specialty Exam: General Appearance: Casual, fairly groomed  Eye Contact:  Good    Speech:  Clear, coherent, normal rate, spontaneous  Volume:  Normal   Mood: Depressed, anxious  Affect: Tearful, constricted, congruent with stated mood  Thought Content: Logical, ruminative  Suicidal Thoughts: see subjective  Thought Process:  Coherent, linear  Orientation:  A&Ox4   Memory:  Immediate good  Judgment:  Fair   Insight:  Fair  Concentration:  Attention and concentration good   Recall:  Good  Fund of Knowledge: Good  Language: Good, fluent  Psychomotor Activity: Normal  Akathisia:  NA   AIMS (if  indicated): NA   Assets:   Communication Skills Desire for Improvement Resilience Social Support  ADL's:  Intact  Cognition: WNL  Sleep: see above  Appetite: see above    Physical Exam Vitals reviewed.  Constitutional:      Appearance: Normal appearance.  HENT:     Head: Normocephalic and atraumatic.  Eyes:     Extraocular Movements: Extraocular movements intact.     Conjunctiva/sclera: Conjunctivae normal.  Pulmonary:     Effort: Pulmonary effort is normal.  Musculoskeletal:        General: Normal range of motion.  Skin:    General: Skin is warm and dry.  Neurological:     General: No focal deficit present.     Mental Status: She is alert.      Metabolic Disorder Labs: No results found for: HGBA1C, MPG No results found for: PROLACTIN Lab Results  Component Value Date   CHOL 250 (H) 01/29/2016   TRIG 87.0 01/29/2016   HDL 65.40 01/29/2016   CHOLHDL 4 01/29/2016   VLDL 17.4 01/29/2016   LDLCALC 167 (H) 01/29/2016   Lab Results  Component Value Date   TSH 1.36 06/30/2015   TSH 4.132 08/24/2011    Therapeutic Level Labs: No results found for: LITHIUM Lab Results  Component Value Date   VALPROATE 32.4 (L) 10/14/2009   VALPROATE 68.2 10/19/2008   Lab Results  Component Value Date   CBMZ 8.7 10/14/2015    Screenings:  AIMS    Flowsheet Row [facility]  AIMS Total Score 0   AUDIT    Flowsheet Row Ad[facility] ([facility]  Alcohol Use Disorder Identification Test Final Score (AUDIT) 0   GAD-7    Flowsheet Row [facility] Counselor from 03/03/2023 in [facility]  Total GAD-7 Score 17 20   PHQ2-9    Flowsheet Row [facility]2023 [facility]  Counselor from 03/03/2023 in [facility] from 12/09/2022 in [facility] Office Visit from 02/12/2016 in [facility] at [facility] Visit from 06/30/2015 in [facility] Care at [facility] Total Score 6 6 6  0 2  PHQ-9 Total Score 22 21 20  -- 19   Flowsheet Row Counselor from 03/03/2023 in [facility] ED from 01/14/2023 in [facility] Counselor from 12/09/2022 in [facility]  C-SSRS RISK CATEGORY Low Risk No Risk Low Risk    Collaboration of Care:   Patient/Guardian was advised Release of Information must be obtained prior to any record release in order to collaborate their care with an outside provider. Patient/Guardian was advised if they have not already done so to contact the registration department to sign all necessary forms in order for us  to release information regarding their care.   Consent: Patient/Guardian gives verbal consent for treatment and assignment of benefits for services provided during this visit. Patient/Guardian expressed understanding and agreed to proceed.    Marlo Masson, MD 10/24/2023, 5:20 PM

## 2023-10-24 ENCOUNTER — Ambulatory Visit (HOSPITAL_BASED_OUTPATIENT_CLINIC_OR_DEPARTMENT_OTHER): Payer: PRIVATE HEALTH INSURANCE | Admitting: Student in an Organized Health Care Education/Training Program

## 2023-10-24 ENCOUNTER — Encounter (HOSPITAL_COMMUNITY): Payer: Self-pay | Admitting: Student in an Organized Health Care Education/Training Program

## 2023-10-24 DIAGNOSIS — F411 Generalized anxiety disorder: Secondary | ICD-10-CM

## 2023-10-24 DIAGNOSIS — F3130 Bipolar disorder, current episode depressed, mild or moderate severity, unspecified: Secondary | ICD-10-CM | POA: Diagnosis not present

## 2023-10-24 DIAGNOSIS — R4589 Other symptoms and signs involving emotional state: Secondary | ICD-10-CM | POA: Diagnosis not present

## 2023-10-24 DIAGNOSIS — G4709 Other insomnia: Secondary | ICD-10-CM

## 2023-10-24 DIAGNOSIS — F4381 Prolonged grief disorder: Secondary | ICD-10-CM

## 2023-10-24 MED ORDER — SERTRALINE HCL 100 MG PO TABS: 200.0000 mg | ORAL_TABLET | Freq: Every day | ORAL | 2 refills | Status: DC

## 2023-10-24 MED ORDER — DIAZEPAM 2 MG PO TABS: 2.0000 mg | ORAL_TABLET | Freq: Every evening | ORAL | 0 refills | Status: DC | PRN

## 2023-10-24 MED ORDER — DIAZEPAM 2 MG PO TABS: 2.0000 mg | ORAL_TABLET | Freq: Every evening | ORAL | 2 refills | Status: DC | PRN

## 2023-10-24 MED ORDER — TRAZODONE HCL 100 MG PO TABS
300.0000 mg | ORAL_TABLET | Freq: Every evening | ORAL | 2 refills | Status: DC | PRN
Start: 2023-10-24 — End: 2023-11-14

## 2023-10-24 NOTE — Patient Instructions (Signed)
 Dear Stacey Weaver,  Most effective treatment for your mental health disease involves BOTH a psychiatrist AND a therapist Psychiatrist to manage medications Therapist to help identify personal goals, barriers from those goals, and plan to achieve those goals by understanding emotions Please make regular appointments with an outpatient psychiatrist and other doctors once you leave the hospital (if any, otherwise, please see below for resources to make an appointment).  For therapy outside the hospital, please ask for these specific types of therapy: DBT ________________________________________________________  SAFETY CRISIS  Dial 988 for National Suicide & Crisis Lifeline    Text [phone] for Crisis Text Line:     [facility]:  [address] FIRST FLOOR.  [place], [place] [address].  [phone]  Mobile Crisis Response Teams Listed by counties in vicinity of [facility] providers [facility] [phone] [facility] [phone] [facility] [phone] [facility] [phone] [place]                * [facility] [phone]                * [facility] [phone] [facility] [phone] [facility]  [phone] * [facility] [phone] ________________________________________________________  To see which pharmacy near you is the CHEAPEST for certain medications, please use GoodRx. It is free website and has a free phone app.    Also consider looking at [facility] $4.00 or [facility]'s $7.00 prescription list. Both are free to view if googled [facility]4 prescription and [facility]'s $7 prescription. These are set prices, no insurance required. [facility] low cost medications: $4-$15 for 30days  prescriptions or $10-$38 for 90days prescriptions  ________________________________________________________  Difficulties with sleep?   Can also use this free app for insomnia called CBT-I. Let your doctors and therapists know so they can help with extra tips and tricks or for guidance and accountability. NO ADDS on the app.     ________________________________________________________  Non-Emergent / Urgent  [facility] [address] SECOND FLOOR [place], [place] [address] [phone] OUTPATIENT Walk-in information: Please note, all walk-ins are first come & first serve, with limited number of availability.  Please note that to be eligible for services you must bring: ID or a piece of mail with your name [facility] address  Therapist for therapy:  Monday & Wednesdays: Please ARRIVE at 7:15 AM for registration Will START at 8:00 AM Every 1st & 2nd Friday of the month: Please ARRIVE at 10:15 AM for registration Will START at 1 PM - 5 PM  Psychiatrist for medication management: Monday - Friday:  Please ARRIVE at 7:15 AM for registration Will START at 8:00 AM  Regretfully, due to limited availability, please be aware that you may not been seen on the same day as walk-in. Please consider making an appoint or try again. Thank you for your patience and understanding.

## 2023-10-26 NOTE — Addendum Note (Signed)
 Addended by: CARVIN CROCK on: 10/26/2023 12:38 PM   Modules accepted: Level of Service

## 2023-10-28 ENCOUNTER — Encounter (HOSPITAL_COMMUNITY): Payer: Self-pay | Admitting: Clinical

## 2023-10-28 ENCOUNTER — Ambulatory Visit (HOSPITAL_COMMUNITY): Payer: PRIVATE HEALTH INSURANCE | Admitting: Clinical

## 2023-10-28 DIAGNOSIS — F4381 Prolonged grief disorder: Secondary | ICD-10-CM | POA: Diagnosis not present

## 2023-10-28 DIAGNOSIS — F411 Generalized anxiety disorder: Secondary | ICD-10-CM | POA: Diagnosis not present

## 2023-10-28 DIAGNOSIS — F3130 Bipolar disorder, current episode depressed, mild or moderate severity, unspecified: Secondary | ICD-10-CM | POA: Diagnosis not present

## 2023-10-28 NOTE — Progress Notes (Signed)
 THERAPIST PROGRESS NOTE  Session Time: 9:15am-10:15am  Session #6  Virtual Visit via Video Note  I connected with Stacey Weaver on 10/28/23 at  9:00 AM EDT by a video enabled telemedicine application and verified that I am speaking with the correct person using two identifiers.  Location: Patient: home Provider: home office   I discussed the limitations of evaluation and management by telemedicine and the availability of in person appointments. The patient expressed understanding and agreed to proceed.   I discussed the assessment and treatment plan with the patient. The patient was provided an opportunity to ask questions and all were answered. The patient agreed with the plan and demonstrated an understanding of the instructions.   The patient was advised to call back or seek an in-person evaluation if the symptoms worsen or if the condition fails to improve as anticipated.  I provided 60 minutes of non-face-to-face time during this encounter.  Stacey Stacey Crest, LCSW   Participation Level: Active  Behavioral Response: Casual Alert Negative, Depressed, Hopeless, and Tearful  Type of Therapy: Individual Therapy  Treatment Goals addressed:  New treatment goals established, current goals reviewed:  STG: Identify and decrease cognitive distortions contributing negatively to mood and behavior by identifying 5-7 cognitive distortions patient has and learning how to come up with replacement thoughts that are more balanced, realistic, and helpful.  LTG: Work to Arts development officer from models like CBT, Stages of Change, DBT, shame resilience theory, ACT, SFBT, MI, trauma-informed therapy and others to be able to manage mental health symptoms, AEB practicing out of session and reporting back.   STG: Patient will work on forgiveness, shame, sleep, relationship to food, or other issues as appropriate and as such issues present during sessions    LTG: Learn and practice  communication techniques such as I statements, open-ended questions, reflective listening, assertiveness, fair fighting rules, initiating conversations, and more as necessary and taught in session   LTG: Score less than 5 on the GAD-7 as evidenced by intermittent administration of the questionnaire to determine progress in management of anxiety.   STG: Stacey Weaver will practice problem solving skills 3 times per week for the next 4 weeks.  LTG: Learn about the feeling of anxiety and its many variations, the cycle of anxiety and how to interrupt that cycle.    STG: Learn breathing techniques and grounding techniques at an age-appropriate level and demonstrate mastery in session then report independent use of these skills out of session.   STG: Learn about boundary types, how to implement them, and how to enforce them so that patient feels more empowered and content with being able to maintain more helpful, appropriate boundaries in the future for a more balanced result.   LTG: Recall traumatic events without becoming overwhelmed with negative emotions STG: Stacey Weaver will practice emotion regulation skills, distress tolerance skills, mindfulness skills, and interpersonal effectiveness skills weekly for the next 26 week(s)  STG: Report a decrease in PTSD symptoms as evidenced by a 25% reduction in overall score on a clinician administered PTSD assessment screen/scale  LTG: Increase coping skills to manage depression/anxiety/anger and improve ability to perform daily activities as evidenced by fewer depressive episodes and/or anger outbursts and/or anxious days per self-report and collateral information.  STG: Process life events to the extent needed so that will be able to move forward with various areas of life in a better frame of mind per self-report.  ProgressTowards Goals: Progressing  Interventions: CBT, Supportive, and Other: grief counseling  Summary: Stacey Weaver is a 48 y.o. female who presents  with prolonged grief and Bipolar 1 disorder. She presented oriented x5 and stated she was feeling in a horrible mind space, irritated, frustrated, having outbursts, nothing is getting easier.  CSW evaluated patient's medication compliance, use of coping tools, and self-care, as applicable.  She provided an update on various aspects of her life that are normally discussed in therapy, including how she feels in this time leading up to the 4th of mother's birthdays that she has been deceased.  CSW explained the Anger Iceberg and its underlying emotions, many of which she is experiencing, and this made sense to her.  With CSW probing gently, she talked about her mother's and grandmother's deaths, disclosed that her mother refused to go through radiation and told the patient that she would always be able to talk to mother, no matter what.  She described what she saw and heard in the process of each death, which now come back to her constantly when I'm awake, and worse when I'm asleep, lots of nightmares every night.  CSW postulated that during the 2 years following her mother's death she was taking care of her grandmother, so she could not really mourn, then for the first year after her grandmother's death she was mourning her grandmother mostly; therefore, the last year is really the first time she has been forced to face both deaths together.   She again described her mother as her other half and said she cannot keep living without her.  Her brother is not a great help to her because he is dealing with his own depression.  She gets quite angry at her family members who say Just pray about it, because she prayed for mother's healing and felt she was not heard so no longer believes in any type of organized religion.  She asked, Why is this happening to me? and said she will never change anything about her apartment, because her mother helped her to decorate it, so she will always feel if she gets rid of something  that she is getting rid of her mother.  CSW used CBT to get her to evaluate whether that is factual and she acknowledged that it is not reality-based and it is not helpful.  CSW asked her to share the last things she remembers her mother saying to her, which she shared without hesitation.  Because she remains upset that even if she talks to her mother, she never hears back from her, CSW explained the concept of writing a letter and waiting to get an impression of what might be said back, then write the answer back.  She was willing to consider doing this.  She said she needs to face her remembrances.  CSW also explained that concept of exposure eventually reducing the horror experienced by the memories.  She was much much calmer at the end of the session.  We did talk about increasing the frequency of her sessions, but CSW has no availability for about 6-7 weeks.  As a result, CSW suggested IOP could be an alternative, but she did not want to take any more time off from work.  We went ahead and scheduled for several months every 2 weeks.  Suicidal/Homicidal: No without intent/plan  Therapist Response:  Patient is progressing AEB engaging in scheduled therapy session.  Throughout the session, CSW gave patient the opportunity to explore thoughts and feelings associated with current life situations and past/present stressors.  CSW challenged patient gently and appropriately to consider different ways of looking at reported issues. CSW encouraged patient's expression of feelings and validated these using empathy, active listening, open body language, and unconditional positive regard.   CSW helped patient to schedule more therapy sessions for the future.  Plan/Recommendations: Return again at next scheduled session on 8/15, try writing letter to mother as well as a response from her, think about the idea of exposure to her memories to reduce them  Diagnosis:  Prolonged grief disorder  Bipolar I disorder,  most recent episode depressed (HCC)  Generalized anxiety disorder  Collaboration of Care: Psychiatrist AEB -psychiatrist can read therapy notes; therapist can and does read psychiatric notes prior to sessions   Patient/Guardian was advised Release of Information must be obtained prior to any record release in order to collaborate their care with an outside provider. Patient/Guardian was advised if they have not already done so to contact the registration department to sign all necessary forms in order for us  to release information regarding their care.   Consent: Patient/Guardian gives verbal consent for treatment and assignment of benefits for services provided during this visit. Patient/Guardian expressed understanding and agreed to proceed.   Stacey Stacey Crest, LCSW 10/28/2023

## 2023-11-11 ENCOUNTER — Ambulatory Visit (INDEPENDENT_AMBULATORY_CARE_PROVIDER_SITE_OTHER): Payer: PRIVATE HEALTH INSURANCE | Admitting: Clinical

## 2023-11-11 ENCOUNTER — Encounter (HOSPITAL_COMMUNITY): Payer: Self-pay | Admitting: Clinical

## 2023-11-11 DIAGNOSIS — F4381 Prolonged grief disorder: Secondary | ICD-10-CM

## 2023-11-11 DIAGNOSIS — F3130 Bipolar disorder, current episode depressed, mild or moderate severity, unspecified: Secondary | ICD-10-CM | POA: Diagnosis not present

## 2023-11-11 DIAGNOSIS — F411 Generalized anxiety disorder: Secondary | ICD-10-CM
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
## 2023-11-14 ENCOUNTER — Encounter (HOSPITAL_COMMUNITY): Payer: Self-pay | Admitting: Student in an Organized Health Care Education/Training Program

## 2023-11-14 ENCOUNTER — Telehealth (HOSPITAL_BASED_OUTPATIENT_CLINIC_OR_DEPARTMENT_OTHER): Payer: PRIVATE HEALTH INSURANCE | Admitting: Student in an Organized Health Care Education/Training Program

## 2023-11-14 DIAGNOSIS — R4589 Other symptoms and signs involving emotional state: Secondary | ICD-10-CM

## 2023-11-14 DIAGNOSIS — F4381 Prolonged grief disorder: Secondary | ICD-10-CM | POA: Diagnosis not present

## 2023-11-14 DIAGNOSIS — G4709 Other insomnia: Secondary | ICD-10-CM

## 2023-11-14 DIAGNOSIS — F3130 Bipolar disorder, current episode depressed, mild or moderate severity, unspecified: Secondary | ICD-10-CM

## 2023-11-14 DIAGNOSIS — F411 Generalized anxiety disorder: Secondary | ICD-10-CM | POA: Diagnosis not present

## 2023-11-14 MED ORDER — PRAZOSIN HCL 1 MG PO CAPS: 3.0000 mg | ORAL_CAPSULE | Freq: Every day | ORAL | 1 refills | Status: DC

## 2023-11-14 MED ORDER — SERTRALINE HCL 100 MG PO TABS: 200.0000 mg | ORAL_TABLET | Freq: Every day | ORAL | 2 refills | Status: DC

## 2023-11-14 MED ORDER — TRAZODONE HCL 100 MG PO TABS: 300.0000 mg | ORAL_TABLET | Freq: Every evening | ORAL | 2 refills | Status: DC | PRN

## 2023-11-14 MED ORDER — CARBAMAZEPINE ER 400 MG PO TB12: 400.0000 mg | ORAL_TABLET | Freq: Two times a day (BID) | ORAL | 1 refills | Status: DC

## 2023-11-14 NOTE — Progress Notes (Signed)
 BH MD Outpatient Progress Note  11/14/2023 4:21 PM Stacey Weaver  MRN:  [id number]  Televisit via video:  I connected with patient on 11/14/23  at 1:45 PM EDT by a video enabled telemedicine application and verified that I am speaking with the correct person using two identifiers.   Location: Patient: Home Provider: Office   I discussed the limitations of evaluation and management by telemedicine and the availability of in person appointments. The patient expressed understanding and agreed to proceed.   I discussed the assessment and treatment plan with the patient. The patient was provided an opportunity to ask questions and all were answered. The patient agreed with the plan and demonstrated an understanding of the instructions.   The patient was advised to call back or seek an in-person evaluation if the symptoms worsen or if the condition fails to improve as anticipated.   I spent 45 minutes in direct patient care.    Assessment:  Stacey Weaver presents for follow-up evaluation in-person on 11/14/23 .  Depression has improved following recent titration of sertraline  to 200 mg daily. Primary concern remains refractory insomnia, characterized by prolonged sleep latency, frequent awakenings, and persistent nightmares, which the patient attributes to trauma related to the loss of her mother and grandmother. Nightmares and associated avoidance behaviors are contributing to poor sleep quality and increased daytime irritability.  Will continue to explore underlying PTSD in future visits. Valium  has been ineffective and is being discontinued.   The bipolar diagnosis will continued to be explored, as there has been no evidence of mood cycling during this prolonged period of insomnia.  Identifying Information: Stacey Weaver is a 48 y.o. female with a history of Bipolar 1 disorder, insomnia, GAD, prolonged grief disorder who is an established patient with [facility] for management of mood and insomnia. She is a former patient of Dr. Rainelle.  For a comprehensive history and detailed assessment, please refer to the initial adult assessment.  Sees Grossman-Orr, Elgie PARAS, LCSW for therapy   Plan:  # Bipolar 1 Disorder #GAD Past medication trials:  Status of problem: Current episode depressed Interventions: -- Continue Tegretol  400 mg BID for mood stabilization -- Continue Zoloft  200 mg daily to better target depression and anxiety     # Prolonged grief disorder #Other insomnia #R/o PTSD Past medication trials: ambien  stopped due to futility; seroquel, abilify , zyprexa, ambien , valium , gabapentin Status of problem: Ongoing Interventions: -- Encourage increased frequency of individual therapy,  -- Increase prazosin  2 mg to 3 mg nightly for nightmares -- Stop Valium  due to futility  PDMP reviewed on 11/14/23  -- Continue trazodone  300 mg nightly,  EKG from June 2024 reviewed showing NSR QTc 403 --Continue therapy with [facility], PCP: Douglass Gerard Dross, PA-C  Weight loss --phentermine 50 mg daily, Wegovy  Patient was given contact information for behavioral health clinic and was instructed to call 911 for emergencies.   Subjective:  Chief Complaint:  Chief Complaint  Patient presents with   Sleeping Problem   Insomnia   Follow-up   Medication Refill    Interval History:  The patient reports that her sadness is not as severe since the last visit and believes that the titration of sertraline  has been helpful. Her primary concern is poor sleep, which she notes has led to increased irritability and difficulty completing her work. She reports averaging 4.5 hours of sleep nightly and has not found valium  helpful; she is willing to  discontinue it. She recalls a previous trial of gabapentin for sleep, which caused hallucinations, and does not remember if seroquel was beneficial.  Regarding sleep hygiene,  she reports showering before bed and keeping the TV off, but she does use her phone while waiting to fall asleep. It typically takes her 1-1.5 hours to fall asleep, with frequent awakenings throughout the night. Out of frustration, she often gets up and starts doing things around the house. She denies snoring and notes that her tonsils and adenoids were removed one year ago. She believes her poor sleep may be related to past trauma, describing frequent flashbacks involving her mother's death and her grandmother's final moments. She reports avoidance of materials, telephone messages, and belongings associated with her grandmother. Nightmares are ongoing, and she is agreeable to increasing her current medication to address this.   Patient reports she continues to have good medication adherence. Caffeine intake is limited to one small bottle of Coke daily. She denies recent substance use, suicidal ideation, and auditory or visual hallucinations. Trauma-focused therapy was discussed as a key part of addressing her current symptoms.    Visit Diagnosis:    ICD-10-CM   1. Bipolar I disorder, most recent episode depressed (HCC)  F31.30 sertraline  (ZOLOFT ) 100 MG tablet    carbamazepine  (TEGRETOL  XR) 400 MG 12 hr tablet    2. Difficulty coping  R45.89 sertraline  (ZOLOFT ) 100 MG tablet    carbamazepine  (TEGRETOL  XR) 400 MG 12 hr tablet    3. Prolonged grief disorder  F43.81 sertraline  (ZOLOFT ) 100 MG tablet    4. Generalized anxiety disorder  F41.1 sertraline  (ZOLOFT ) 100 MG tablet    prazosin  (MINIPRESS ) 1 MG capsule    5. Other insomnia  G47.09 prazosin  (MINIPRESS ) 1 MG capsule    traZODone  (DESYREL ) 100 MG tablet       Past Psychiatric History:  Diagnoses: Bipolar 1 disorder, Medication trials: Haldol, Abilify  (previously discontinued due to cost), Klonopin , Lamictal , Wellbutrin , Prozac , Cymbalta (experienced muscle spasms and suicidal thoughts), Geodon and Zyprexa (experienced hallucinations),  Navane  Previous psychiatrist/therapist: [facility] with Dr. Donn as her outpatient psychiatrist Hospitalizations: [facility] in 2009, multiple times in 2010, 2012, 2017 Suicide attempts: 2012-overdose of Wellbutrin  150 mg; overdose in 2013 on Wellbutrin  and Benadryl  SIB: Denies Hx of violence towards others: Denies Current access to guns: Denies Hx of trauma/abuse: Denies    Social History   Socioeconomic History   Marital status: Single    Spouse name: Not on file   Number of children: Not on file   Years of education: Not on file   Highest education level: Not on file  Occupational History   Not on file  Tobacco Use   Smoking status: Never   Smokeless tobacco: Never  Substance and Sexual Activity   Alcohol use: No   Drug use: No   Sexual activity: Yes    Birth control/protection: I.U.D.  Other Topics Concern   Not on file  Social History Narrative   Not on file   Social Drivers of Health   Financial Resource Strain: Not on file  Food Insecurity: Low Risk  (08/10/2023)   Received from [facility]   Hunger Vital Sign    Within the past 12 months, you worried that your food would run out before you got money to buy more: Never true    Within the past 12 months, the food you bought just didn't last and you didn't have money to get  more: Not on file  Recent Concern: Food Insecurity - Medium Risk (08/10/2023)   Received from [facility]   Hunger Vital Sign    Within the past 12 months, you worried that your food would run out before you got money to buy more: Never true    Within the past 12 months, the food you bought just didn't last and you didn't have money to get more. : Sometimes true  Transportation Needs: Not on file (11/04/2022)  Physical Activity: Not on file  Stress: Not on file  Social Connections: Unknown (08/04/2021)   [facility]   Social Network    Social Network: Not on file     Allergies:  Allergies  Allergen Reactions   Almotriptan Malate Shortness Of Breath and Other (See Comments)    Muscle spasms   Cymbalta [Duloxetine Hcl] Other (See Comments)    Other reaction(s): Other Muscle spasms suicidal thoughts   Imitrex [Sumatriptan] Shortness Of Breath, Other (See Comments) and Palpitations    Muscle spasms   Prednisone Other (See Comments) and Palpitations   Zomig Shortness Of Breath   Geodon [Ziprasidone Hcl] Other (See Comments)    hallucinations   Lactose Intolerance (Gi) Diarrhea and Other (See Comments)    Gas and bloated    Penicillins Other (See Comments)    GI upset  Has patient had a PCN reaction causing immediate rash, facial/tongue/throat swelling, SOB or lightheadedness with hypotension: No Has patient had a PCN reaction causing severe rash involving mucus membranes or skin necrosis: No Has patient had a PCN reaction that required hospitalization No Has patient had a PCN reaction occurring within the last 10 years: Yes If all of the above answers are NO, then may proceed with Cephalosporin use.    Zyprexa [Olanzapine] Other (See Comments)    Hallucinations     Current Medications: Current Outpatient Medications  Medication Sig Dispense Refill   atorvastatin (LIPITOR) 10 MG tablet Take 10 mg by mouth daily.     carbamazepine  (TEGRETOL  XR) 400 MG 12 hr tablet Take 1 tablet (400 mg total) by mouth 2 (two) times daily. 60 tablet 1   diazepam  (VALIUM ) 2 MG tablet Take 1 tablet (2 mg total) by mouth at bedtime as needed for anxiety. 30 tablet 0   Erenumab-aooe (AIMOVIG) 70 MG/ML SOAJ Inject 70 mg/mL as directed every 30 (thirty) days.     metoprolol succinate (TOPROL-XL) 25 MG 24 hr tablet Take 1 tablet by mouth daily.     phentermine 15 MG capsule Take 15 mg by mouth daily.     potassium chloride  (KLOR-CON ) 10 MEQ tablet Take 10 mEq by mouth daily.     prazosin  (MINIPRESS ) 1 MG capsule Take 3 capsules (3 mg total) by mouth at bedtime.  90 capsule 1   sertraline  (ZOLOFT ) 100 MG tablet Take 2 tablets (200 mg total) by mouth daily. 60 tablet 2   traZODone  (DESYREL ) 100 MG tablet Take 3 tablets (300 mg total) by mouth at bedtime as needed for sleep. 90 tablet 2   UBRELVY 100 MG TABS Take by mouth.     WEGOVY 0.5 MG/0.5ML SOAJ Inject 0.5 mg into the skin once a week.     No current facility-administered medications for this visit.    ROS: Review of Systems  All other systems reviewed and are negative.   Objective:  Objective: Psychiatric Specialty Exam: General Appearance: Casual, fairly groomed  Eye Contact:  Good    Speech:  Clear, coherent, normal  rate, spontaneous  Volume:  Normal   Mood: Depressed, anxious  Affect:constricted  Thought Content: Logical, ruminative  Suicidal Thoughts: see subjective  Thought Process:  Coherent, linear  Orientation:  A&Ox4   Memory:  Immediate good  Judgment:  Fair   Insight:  Fair  Concentration:  Attention and concentration good   Recall:  Good  Fund of Knowledge: Good  Language: Good, fluent  Psychomotor Activity: Normal  Akathisia:  NA   AIMS (if indicated): NA   Assets:   Communication Skills Desire for Improvement Resilience Social Support  ADL's:  Intact  Cognition: WNL  Sleep: see above  Appetite: see above    Physical Exam Vitals reviewed.  Constitutional:      Appearance: Normal appearance.  HENT:     Head: Normocephalic and atraumatic.  Eyes:     Extraocular Movements: Extraocular movements intact.     Conjunctiva/sclera: Conjunctivae normal.  Pulmonary:     Effort: Pulmonary effort is normal.  Musculoskeletal:        General: Normal range of motion.  Skin:    General: Skin is warm and dry.  Neurological:     General: No focal deficit present.     Mental Status: She is alert.      Metabolic Disorder Labs: No results found for: HGBA1C, MPG No results found for: PROLACTIN Lab Results  Component Value Date   CHOL 250 (H)  01/29/2016   TRIG 87.0 01/29/2016   HDL 65.40 01/29/2016   CHOLHDL 4 01/29/2016   VLDL 17.4 01/29/2016   LDLCALC 167 (H) 01/29/2016   Lab Results  Component Value Date   TSH 1.36 06/30/2015   TSH 4.132 08/24/2011    Therapeutic Level Labs: No results found for: LITHIUM Lab Results  Component Value Date   VALPROATE 32.4 (L) 10/14/2009   VALPROATE 68.2 10/19/2008   Lab Results  Component Value Date   CBMZ 8.7 10/14/2015    Screenings:  AIMS    Flowsheet Row [facility]  AIMS Total Score 0   AUDIT    Flowsheet Row [facility] ([facility]  Alcohol Use Disorder Identification Test Final Score (AUDIT) 0   GAD-7    Flowsheet Row [facility] Counselor from 03/03/2023 in [facility]  Total GAD-7 Score 17 20   PHQ2-9    Flowsheet Row [facility] Counselor from 03/03/2023 in [facility] from 12/09/2022 in [facility] Office Visit from 02/12/2016 in [facility] at [facility] Visit from 06/30/2015 in [facility] at [facility] Total Score 6 6 6  0 2  PHQ-9 Total Score 22 21 20  -- 19   Flowsheet Row Counselor from 03/03/2023 in [facility] at [facility] ED from 01/14/2023 in [facility] Emergency Department at [facility] Counselor from 12/09/2022 in [facility]  C-SSRS RISK CATEGORY Low Risk No Risk Low Risk    Collaboration of Care:   Patient/Guardian was advised Release of Information must be obtained prior to any record release in order to collaborate their care with an outside provider.  Patient/Guardian was advised if they have not already done so to contact the registration department to sign all necessary forms in order for us  to release information  regarding their care.   Consent: Patient/Guardian gives verbal consent for treatment and assignment of benefits for services provided during this visit. Patient/Guardian expressed understanding and agreed to proceed.    Marlo Masson, MD 11/14/2023, 4:21 PM

## 2023-11-14 NOTE — Patient Instructions (Signed)
 Dear Ovid,  Most effective treatment for your mental health disease involves BOTH a psychiatrist AND a therapist Psychiatrist to manage medications Therapist to help identify personal goals, barriers from those goals, and plan to achieve those goals by understanding emotions Please make regular appointments with an outpatient psychiatrist and other doctors once you leave the hospital (if any, otherwise, please see below for resources to make an appointment).  For therapy outside the hospital, please ask for these specific types of therapy: DBT ________________________________________________________  SAFETY CRISIS  Dial 988 for National Suicide & Crisis Lifeline    Text [phone] for Crisis Text Line:     [facility]:  [address] FIRST FLOOR.  [place], [place] [address].  [phone]  Mobile Crisis Response Teams Listed by counties in vicinity of [facility] providers [facility] [phone] [facility] [phone] [facility] [phone] [facility] [phone] [place]                * [facility] [phone]                * [facility] [phone] [facility] [phone] [facility]  [phone] * [facility] [phone] ________________________________________________________  To see which pharmacy near you is the CHEAPEST for certain medications, please use GoodRx. It is free website and has a free phone app.    Also consider looking at [facility] $4.00 or [facility]'s $7.00 prescription list. Both are free to view if googled [facility]4 prescription and [facility]'s $7 prescription. These are set prices, no insurance required. [facility] low cost medications: $4-$15 for 30days  prescriptions or $10-$38 for 90days prescriptions  ________________________________________________________  Difficulties with sleep?   Can also use this free app for insomnia called CBT-I. Let your doctors and therapists know so they can help with extra tips and tricks or for guidance and accountability. NO ADDS on the app.     ________________________________________________________  Non-Emergent / Urgent  [facility] [address] SECOND FLOOR [place], [place] [address] [phone] OUTPATIENT Walk-in information: Please note, all walk-ins are first come & first serve, with limited number of availability.  Please note that to be eligible for services you must bring: ID or a piece of mail with your name [facility] address  Therapist for therapy:  Monday & Wednesdays: Please ARRIVE at 7:15 AM for registration Will START at 8:00 AM Every 1st & 2nd Friday of the month: Please ARRIVE at 10:15 AM for registration Will START at 1 PM - 5 PM  Psychiatrist for medication management: Monday - Friday:  Please ARRIVE at 7:15 AM for registration Will START at 8:00 AM  Regretfully, due to limited availability, please be aware that you may not been seen on the same day as walk-in. Please consider making an appoint or try again. Thank you for your patience and understanding.

## 2023-11-15 NOTE — Addendum Note (Signed)
 Addended by: CARVIN CROCK on: 11/15/2023 09:05 AM   Modules accepted: Level of Service

## 2023-12-09 ENCOUNTER — Ambulatory Visit (HOSPITAL_COMMUNITY): Payer: PRIVATE HEALTH INSURANCE | Admitting: Clinical

## 2023-12-09 ENCOUNTER — Other Ambulatory Visit: Payer: Self-pay

## 2023-12-09 ENCOUNTER — Emergency Department (HOSPITAL_BASED_OUTPATIENT_CLINIC_OR_DEPARTMENT_OTHER)
Admission: EM | Admit: 2023-12-09 | Discharge: 2023-12-09 | Disposition: A | Discharge status: Home / Self Care | Payer: PRIVATE HEALTH INSURANCE | Attending: Emergency Medicine | Admitting: Emergency Medicine

## 2023-12-09 ENCOUNTER — Encounter (HOSPITAL_COMMUNITY): Payer: Self-pay | Admitting: Clinical

## 2023-12-09 ENCOUNTER — Other Ambulatory Visit (HOSPITAL_BASED_OUTPATIENT_CLINIC_OR_DEPARTMENT_OTHER): Payer: Self-pay

## 2023-12-09 ENCOUNTER — Encounter (HOSPITAL_BASED_OUTPATIENT_CLINIC_OR_DEPARTMENT_OTHER): Payer: Self-pay

## 2023-12-09 DIAGNOSIS — J45909 Unspecified asthma, uncomplicated: Secondary | ICD-10-CM | POA: Diagnosis not present

## 2023-12-09 DIAGNOSIS — F3164 Bipolar disorder, current episode mixed, severe, with psychotic features: Secondary | ICD-10-CM

## 2023-12-09 DIAGNOSIS — M62838 Other muscle spasm: Secondary | ICD-10-CM | POA: Diagnosis not present

## 2023-12-09 DIAGNOSIS — M542 Cervicalgia: Secondary | ICD-10-CM | POA: Diagnosis present

## 2023-12-09 DIAGNOSIS — F411 Generalized anxiety disorder: Secondary | ICD-10-CM

## 2023-12-09 DIAGNOSIS — F4381 Prolonged grief disorder: Secondary | ICD-10-CM | POA: Diagnosis not present

## 2023-12-09 MED ORDER — METHYLPREDNISOLONE SODIUM SUCC 125 MG IJ SOLR
60.0000 mg | Freq: Once | INTRAMUSCULAR | Status: AC
  Administered 2023-12-09: 60 mg via INTRAMUSCULAR
  Filled 2023-12-09: qty 2

## 2023-12-09 MED ORDER — OXYCODONE HCL 5 MG PO TABS
2.5000 mg | ORAL_TABLET | ORAL | 0 refills | Status: AC | PRN
  Filled 2023-12-09: qty 12, 4d supply, fill #0

## 2023-12-09 NOTE — ED Provider Notes (Signed)
 [place] [facility] Provider Note   CSN: [id number] Arrival date & time: 12/09/23  1227     Patient presents with: Neck Pain   Stacey Weaver is a 48 y.o. female.    Neck Pain   48 year old female presents emergency department complaints of left-sided neck pain.  States that she has been having symptoms constantly for the past 6 days.  Initially noticed this upon awakening.  States that pain is worsened with movement of her neck as well as certain movements of her left shoulder.  Has tried multiple over-the-counter medications without significant improvement.  Did go to provider yesterday received steroid injection and was sent home with muscle relaxer.  States that symptoms have not been significant improved since then.  Does report Valium  use at home as needed for anxiety and states that this has not helped either for her neck.  Denies any chest pain, shortness of breath, weakness in bilateral upper extremities.  Does report some tingling sensation that goes down to her left hand.  Denies any known trauma/falls.  Presents emergency department for further assessment.  Past medical history significant for asthma, migraine, MDD bipolar 1 disorder  Prior to Admission medications   Medication Sig Start Date End Date Taking? Authorizing Provider  oxyCODONE  (ROXICODONE ) 5 MG immediate release tablet Take 0.5 tablets (2.5 mg total) by mouth every 4 (four) hours as needed for severe pain (pain score 7-10). 12/09/23  Yes Silver Fell A, PA  atorvastatin (LIPITOR) 10 MG tablet Take 10 mg by mouth daily. 04/29/22 04/29/23  [provider]  carbamazepine  (TEGRETOL  XR) 400 MG 12 hr tablet Take 1 tablet (400 mg total) by mouth 2 (two) times daily. 11/14/23 01/13/24  Carrion-Carrero, Marlo, MD  diazepam  (VALIUM ) 2 MG tablet Take 1 tablet (2 mg total) by mouth at bedtime as needed for anxiety. 10/24/23   Carrion-Carrero, Marlo, MD  Erenumab-aooe  (AIMOVIG) 70 MG/ML SOAJ Inject 70 mg/mL as directed every 30 (thirty) days. 05/09/19   [provider]  metoprolol succinate (TOPROL-XL) 25 MG 24 hr tablet Take 1 tablet by mouth daily. 08/02/23 08/02/24  [provider]  phentermine 15 MG capsule Take 15 mg by mouth daily. 07/19/22   [provider]  potassium chloride  (KLOR-CON ) 10 MEQ tablet Take 10 mEq by mouth daily. 08/10/23   [provider]  prazosin  (MINIPRESS ) 1 MG capsule Take 3 capsules (3 mg total) by mouth at bedtime. 11/14/23 01/13/24  Carrion-Carrero, Marlo, MD  sertraline  (ZOLOFT ) 100 MG tablet Take 2 tablets (200 mg total) by mouth daily. 11/14/23 02/12/24  Carrion-Carrero, Marlo, MD  traZODone  (DESYREL ) 100 MG tablet Take 3 tablets (300 mg total) by mouth at bedtime as needed for sleep. 11/14/23 02/12/24  Carrion-Carrero, Marlo, MD  UBRELVY 100 MG TABS Take by mouth.    [provider]  WEGOVY 0.5 MG/0.5ML SOAJ Inject 0.5 mg into the skin once a week. 07/26/22   [provider]    Allergies: Almotriptan malate, Cymbalta [duloxetine hcl], Imitrex [sumatriptan], Prednisone, Zomig, Geodon [ziprasidone hcl], Lactose intolerance (gi), Penicillins, and Zyprexa [olanzapine]    Review of Systems  Musculoskeletal:  Positive for neck pain.  All other systems reviewed and are negative.   Updated Vital Signs BP 122/68 (BP Location: Left Arm)   Pulse 88   Temp 98.1 F (36.7 C)   Resp 18   Wt 69.9 kg   SpO2 100%   BMI 26.43 kg/m   Physical Exam Vitals and nursing  note reviewed.  Constitutional:      General: She is not in acute distress.    Appearance: She is well-developed.  HENT:     Head: Normocephalic and atraumatic.  Eyes:     Conjunctiva/sclera: Conjunctivae normal.  Cardiovascular:     Rate and Rhythm: Normal rate and regular rhythm.     Pulses: Normal pulses.     Heart sounds: No murmur heard. Pulmonary:     Effort: Pulmonary effort is normal. No respiratory  distress.     Breath sounds: Normal breath sounds.  Abdominal:     Palpations: Abdomen is soft.     Tenderness: There is no abdominal tenderness.  Musculoskeletal:        General: No swelling.     Cervical back: Neck supple.     Comments: No midline tenderness cervical spine.  Paraspinal tenderness noted in the left cervical region with obvious spasming and extension down left trapezial ridge.  Muscular strength out of 5 bilaterally.  Patient with intact sensation bilateral upper extremities.  Radial pulses 2+ bilaterally.  Skin:    General: Skin is warm and dry.     Capillary Refill: Capillary refill takes less than 2 seconds.  Neurological:     Mental Status: She is alert.  Psychiatric:        Mood and Affect: Mood normal.     (all labs ordered are listed, but only abnormal results are displayed) Labs Reviewed - No data to display  EKG: None  Radiology: No results found.   Procedures   Medications Ordered in the ED  methylPREDNISolone  sodium succinate (SOLU-MEDROL ) 125 mg/2 mL injection 60 mg (has no administration in time range)                                    Medical Decision Making Risk Prescription drug management.   This patient presents to the ED for concern of neck pain, this involves an extensive number of treatment options, and is a complaint that carries with it a high risk of complications and morbidity.  The differential diagnosis includes fracture, dislocation, cervical strain/spasm, arterial dissection, meningitis, ACS,, other   Co morbidities that complicate the patient evaluation  See HPI   Additional history obtained:  Additional history obtained from EMR External records from outside source obtained and reviewed including hospital records   Lab Tests:  Patient declined  Imaging Studies ordered:  Patient declined   Cardiac Monitoring: / EKG:  Patient declined   Consultations Obtained:  N/a   Problem List / ED Course /  Critical interventions / Medication management  Left neck pain I ordered medication including Solu-Medrol    Reevaluation of the patient after these medicines showed that the patient improved I have reviewed the patients home medicines and have made adjustments as needed   Social Determinants of Health:  Denies tobacco, licit drug use.   Test / Admission - Considered:  Left neck pain Vitals signs within normal range and stable throughout visit.  48 year old female presents emergency department complaints of left-sided neck pain.  States that she has been having symptoms constantly for the past 6 days.  Initially noticed this upon awakening.  States that pain is worsened with movement of her neck as well as certain movements of her left shoulder.  Has tried multiple over-the-counter medications without significant improvement.  Did go to provider yesterday received steroid injection and was sent home with  muscle relaxer.  States that symptoms have not been significant improved since then.  Does report Valium  use at home as needed for anxiety and states that this has not helped either for her neck.  Denies any chest pain, shortness of breath, weakness in bilateral upper extremities.  Does report some tingling sensation that goes down to her left hand.  Denies any known trauma/falls.  Presents emergency department for further assessment. On exam, obvious spasming as well as tenderness left paraspinal cervical region with extension down left trapezial ridge.  No obvious weakness on exam bilateral upper extremities with some subjective tingling sensation down left upper extremity.  Intact pulses.  Patient without chest pain rating back on pulse deficits, neurodeficits, hypertension; low suspicion for serial dissection.  At time of initial exam, patient had been in the emergency department for nearly 3 hours.  Offered more states of workup including labs, chest x-ray, EKG, imaging of the neck but this was  declined by patient.  She feels like this is due to spasming of her neck muscles of which seems to be the case clinically.  I discussed risks of inadequate/insufficient workup such as increased risk of potential morbidity/mortality she acknowledged her standing is still agreed.  States that she would prefer to have steroid injection, medicine to help her pain and follow-up with her primary care.  She states that she will return if her symptoms do not improve or worsen. Worrisome signs and symptoms were discussed with the patient, and the patient acknowledged understanding to return to the ED if noticed. Patient was stable upon discharge.       Final diagnoses:  Muscle spasms of neck          Silver Wonda LABOR, [place] 12/09/23 1510    Elnor Jayson LABOR, DO 12/14/23 2358

## 2023-12-09 NOTE — Discharge Instructions (Addendum)
 As discussed, recommend taking your prednisone taper at home.  Will send in medicine for pain.  Please do not take your  diazepam  medication with the pain medication as there are significant side effects such as affecting your drive to breathe and in some cases death.  Recommend gentle stretching exercises at home.  Recommend following up primary care for reassessment.

## 2023-12-09 NOTE — ED Triage Notes (Signed)
 Left side neck pain x 6 days. Seen by PCP yesterday given steroid injection, flexeril  and lidocaine  patch. Pain is not improving. Woke up with left arm and hand numbness and tingling. Ice/heat therapy

## 2023-12-09 NOTE — Progress Notes (Unsigned)
 THERAPIST PROGRESS NOTE  Session Time: 8:00am-9:00am  Session #9  Virtual Visit via Video Note  I connected with Stacey Weaver on 12/09/23 at  8:00 AM EDT by a video enabled telemedicine application and verified that I am speaking with the correct person using two identifiers.  Location: Patient: home Provider: home office   I discussed the limitations of evaluation and management by telemedicine and the availability of in person appointments. The patient expressed understanding and agreed to proceed.   I discussed the assessment and treatment plan with the patient. The patient was provided an opportunity to ask questions and all were answered. The patient agreed with the plan and demonstrated an understanding of the instructions.   The patient was advised to call back or seek an in-person evaluation if the symptoms worsen or if the condition fails to improve as anticipated.  I provided 60 minutes of non-face-to-face time during this encounter.  [name], LCSW   Participation Level: Active  Behavioral Response: Casual Alert Angry, Depressed, Hopeless, and hyperverbal  Type of Therapy: Individual Therapy  Treatment Goals addressed: *** STG: Identify and decrease cognitive distortions contributing negatively to mood and behavior by identifying 5-7 cognitive distortions patient has and learning how to come up with replacement thoughts that are more balanced, realistic, and helpful.  LTG: Work to Arts development officer from models like CBT, Stages of Change, DBT, shame resilience theory, ACT, SFBT, MI, trauma-informed therapy and others to be able to manage mental health symptoms, AEB practicing out of session and reporting back.   STG: Patient will work on forgiveness, shame, sleep, relationship to food, or other issues as appropriate and as such issues present during sessions    LTG: Learn and practice communication techniques such as I statements, open-ended  questions, reflective listening, assertiveness, fair fighting rules, initiating conversations, and more as necessary and taught in session   LTG: Score less than 5 on the GAD-7 as evidenced by intermittent administration of the questionnaire to determine progress in management of anxiety.   STG: Hayzlee will practice problem solving skills 3 times per week for the next 4 weeks.  LTG: Learn about the feeling of anxiety and its many variations, the cycle of anxiety and how to interrupt that cycle.    STG: Learn breathing techniques and grounding techniques at an age-appropriate level and demonstrate mastery in session then report independent use of these skills out of session.   STG: Learn about boundary types, how to implement them, and how to enforce them so that patient feels more empowered and content with being able to maintain more helpful, appropriate boundaries in the future for a more balanced result.   LTG: Recall traumatic events without becoming overwhelmed with negative emotions STG: Redell will practice emotion regulation skills, distress tolerance skills, mindfulness skills, and interpersonal effectiveness skills weekly for the next 26 week(s)  STG: Report a decrease in PTSD symptoms as evidenced by a 25% reduction in overall score on a clinician administered PTSD assessment screen/scale  LTG: Increase coping skills to manage depression/anxiety/anger and improve ability to perform daily activities as evidenced by fewer depressive episodes and/or anger outbursts and/or anxious days per self-report and collateral information.  STG: Process life events to the extent needed so that will be able to move forward with various areas of life in a better frame of mind per self-report.  ProgressTowards Goals: Progressing  Interventions: Supportive ***  Summary: Stacey Weaver is a 48 y.o. female who presents with  prolonged grief and Bipolar 1 disorder. She presented oriented x5 and stated she was  feeling ***.  CSW evaluated patient's medication compliance, use of coping tools, and self-care, as applicable.  She provided an update on various aspects of her life that are normally discussed in therapy, including ***  Suicidal/Homicidal: No without intent/plan  Therapist Response:  Patient is progressing AEB engaging in scheduled therapy session.  Throughout the session, CSW gave patient the opportunity to explore thoughts and feelings associated with current life situations and past/present stressors.   CSW challenged patient gently and appropriately to consider different ways of looking at reported issues. CSW encouraged patient's expression of feelings and validated these using empathy, active listening, open body language, and unconditional positive regard.     Plan/Recommendations: Return again at next scheduled session on 9/26, try Thought Stopping, thought clouds, creating a happiness triggers playlist, and journals with prompts  Diagnosis:  Bipolar I disorder, most recent episode mixed, severe with psychotic features (HCC)  Prolonged grief disorder  Generalized anxiety disorder  Collaboration of Care: Psychiatrist AEB -psychiatrist can read therapy notes; therapist can and does read psychiatric notes prior to sessions   Patient/Guardian was advised Release of Information must be obtained prior to any record release in order to collaborate their care with an outside provider. Patient/Guardian was advised if they have not already done so to contact the registration department to sign all necessary forms in order for us  to release information regarding their care.   Consent: Patient/Guardian gives verbal consent for treatment and assignment of benefits for services provided during this visit. Patient/Guardian expressed understanding and agreed to proceed.   [name], LCSW 12/09/2023

## 2023-12-11 NOTE — Progress Notes (Signed)
 BH MD Outpatient Progress Note  12/12/2023 4:37 PM Stacey Weaver  MRN:  [id number]  Assessment:  [name] presents for follow-up evaluation in-person on 12/12/23 .  At this visit, the patient reports a significant worsening of her mental health over the past 2.5 weeks.  The patient believes her symptoms of insomnia, visual hallucinations, talkativeness, ruminative thinking, and impulsivity to be associated with her prior diagnosis of bipolar disorder.  She is amenable to restarting aripiprazole  for the aforementioned symptoms, which she previously found helpful in stabilizing her mood symptoms.  Per patient, nightmares have decreased in frequency with prazosin , but overall sleep quality remains poor.  Most recently she reported taking up to 125 mg of Benadryl  nightly in an effort to obtain relief from insomnia, and patient was advised to discontinue use.  Visual hallucinations reported by the patient may be attributable, at least in part, to severe sleep deprivation in the context of chronic insomnia and high-dose diphenhydramine  use.  Due to patient's report of prior response to low-dose benzodiazepines, a limited supply of lorazepam  for acute anxiety and sleep was ordered, though it is unlikely to improve restorative sleep.  We will follow-up in 2 weeks and consider switching trazodone  to mirtazapine  if sleep remains poor.   Identifying Information: Stacey Weaver is a 48 y.o. female with a history of Bipolar 1 disorder, insomnia, GAD, prolonged grief disorder who is an established patient with [facility] for management of mood and insomnia. She is a former patient of Dr. Rainelle.  For a comprehensive history and detailed assessment, please refer to the initial adult assessment.  Sees Grossman-Orr, Elgie PARAS, LCSW for therapy   Plan:  # Bipolar 1 Disorder #GAD Past medication trials: Depakote, Abilify , Lamictal , Prozac , Cymbalta Status of problem:  Current episode depressed Interventions: --Continue Tegretol  400 mg BID for mood stabilization --Continue Zoloft  200 mg daily to better target depression and anxiety --Start abilify  5 mg for improved mood stability     # Prolonged grief disorder #Other insomnia #R/o PTSD Past medication trials: ambien  stopped due to futility; seroquel, abilify , geodon (worsened hallucinations), zyprexa (stopped for hallucinations), ambien , valium , gabapentin (patient reported bad reaction to this),  Status of problem: Ongoing Interventions: --Start ativan  0.5 daily PRN, 5 tablets monthly --Continue prazosin  3 mg nightly for nightmares --Continue trazodone  300 mg nightly,  EKG from June 2024 reviewed showing NSR QTc 403 Consider transition to Remeron  7.5 mg at next visit if insomnia has not improved --Continue therapy with [facility] maintenance, PCP: Douglass Gerard Dross, PA-C  Weight loss --phentermine 50 mg daily, Wegovy  Patient was given contact information for behavioral health clinic and was instructed to call 911 for emergencies.   Subjective:  Chief Complaint:  Chief Complaint  Patient presents with   Sleeping Problem   Stress   Medication Problem    Interval History:  Patient reports a significant worsening of her mental health over the past 2 to 2.5 weeks, which she attributes to recent changes in her medications. She describes the onset of manic symptoms during this period, including increased talkativeness, noticeable behavioral changes at work, and recent financial indiscretions. She expresses concern that her coworkers have observed these changes and is worried about the possibility of hospitalization due to her current mental state. She identifies her bipolar disorder and associated mania as the primary drivers of her symptoms.  She continues to experience distress related to the loss of her mother. Regarding sleep, she finds prazosin  helpful  for nightmares, which have  become less frequent but remain vivid when they occur. Despite this, her overall sleep quality remains poor. She reports using five 25 mg tablets of diphenhydramine  nightly to aid sleep. She also describes recent onset of visual hallucinations, specifically seeing bugs everywhere, which began around the same time as her other symptoms.  She notes minimal benefit from diazepam  and reports using alprazolam every 4 to 5 days. She is amenable to restarting aripiprazole , which she previously found beneficial but discontinued due to cost, and is agreeable to a limited supply of lorazepam  for acute anxiety management.   Visit Diagnosis:    ICD-10-CM   1. Other insomnia  G47.09 LORazepam  (ATIVAN ) 0.5 MG tablet    prazosin  (MINIPRESS ) 1 MG capsule    2. Bipolar I disorder, most recent episode depressed (HCC)  F31.30 ARIPiprazole  (ABILIFY ) 5 MG tablet    sertraline  (ZOLOFT ) 100 MG tablet    3. Difficulty coping  R45.89 sertraline  (ZOLOFT ) 100 MG tablet    4. Prolonged grief disorder  F43.81 sertraline  (ZOLOFT ) 100 MG tablet    5. Generalized anxiety disorder  F41.1 LORazepam  (ATIVAN ) 0.5 MG tablet    sertraline  (ZOLOFT ) 100 MG tablet    prazosin  (MINIPRESS ) 1 MG capsule        Past Psychiatric History:  Diagnoses: Bipolar 1 disorder, Medication trials: Haldol, Abilify  (previously discontinued due to cost), Klonopin , Lamictal , Wellbutrin , Prozac , Cymbalta (experienced muscle spasms and suicidal thoughts), Geodon and Zyprexa (experienced hallucinations), Navane , Depakote  Previous psychiatrist/therapist: [facility] with Dr. Donn as her outpatient psychiatrist Hospitalizations: [facility] in 2009, multiple times in 2010, 2012, 2017 Suicide attempts: 2012-overdose of Wellbutrin  150 mg; overdose in 2013 on Wellbutrin  and Benadryl  SIB: Denies Hx of violence towards others: Denies Current access to guns: Denies Hx of trauma/abuse: Denies     Social History   Socioeconomic History   Marital status: Single    Spouse name: Not on file   Number of children: Not on file   Years of education: Not on file   Highest education level: Not on file  Occupational History   Not on file  Tobacco Use   Smoking status: Never   Smokeless tobacco: Never  Substance and Sexual Activity   Alcohol use: No   Drug use: No   Sexual activity: Yes    Birth control/protection: I.U.D.  Other Topics Concern   Not on file  Social History Narrative   Not on file   Social Drivers of Health   Financial Resource Strain: Not on file  Food Insecurity: Low Risk  (08/10/2023)   Received from [facility]   Hunger Vital Sign    Within the past 12 months, you worried that your food would run out before you got money to buy more: Never true    Within the past 12 months, the food you bought just didn't last and you didn't have money to get more: Not on file  Recent Concern: Food Insecurity - Medium Risk (08/10/2023)   Received from [facility]   Hunger Vital Sign    Within the past 12 months, you worried that your food would run out before you got money to buy more: Never true    Within the past 12 months, the food you bought just didn't last and you didn't have money to get more. : Sometimes true  Transportation Needs: Not on file (11/04/2022)  Physical Activity: Not on file  Stress: Not on  file  Social Connections: Unknown (08/04/2021)   Received from [facility]   Social Network    Social Network: Not on file    Allergies:  Allergies  Allergen Reactions   Almotriptan Malate Shortness Of Breath and Other (See Comments)    Muscle spasms   Cymbalta [Duloxetine Hcl] Other (See Comments)    Other reaction(s): Other Muscle spasms suicidal thoughts   Imitrex [Sumatriptan] Shortness Of Breath, Other (See Comments) and Palpitations    Muscle spasms   Prednisone Other (See Comments) and Palpitations   Zomig Shortness Of Breath   Geodon  [Ziprasidone Hcl] Other (See Comments)    hallucinations   Lactose Intolerance (Gi) Diarrhea and Other (See Comments)    Gas and bloated    Penicillins Other (See Comments)    GI upset  Has patient had a PCN reaction causing immediate rash, facial/tongue/throat swelling, SOB or lightheadedness with hypotension: No Has patient had a PCN reaction causing severe rash involving mucus membranes or skin necrosis: No Has patient had a PCN reaction that required hospitalization No Has patient had a PCN reaction occurring within the last 10 years: Yes If all of the above answers are NO, then may proceed with Cephalosporin use.    Zyprexa [Olanzapine] Other (See Comments)    Hallucinations     Current Medications: Current Outpatient Medications  Medication Sig Dispense Refill   ARIPiprazole  (ABILIFY ) 5 MG tablet Take 1 tablet (5 mg total) by mouth daily. 30 tablet 0   LORazepam  (ATIVAN ) 0.5 MG tablet Take 1 tablet (0.5 mg total) by mouth daily as needed for anxiety. 5 tablet 0   atorvastatin (LIPITOR) 10 MG tablet Take 10 mg by mouth daily.     carbamazepine  (TEGRETOL  XR) 400 MG 12 hr tablet Take 1 tablet (400 mg total) by mouth 2 (two) times daily. 60 tablet 1   Erenumab-aooe (AIMOVIG) 70 MG/ML SOAJ Inject 70 mg/mL as directed every 30 (thirty) days.     metoprolol succinate (TOPROL-XL) 25 MG 24 hr tablet Take 1 tablet by mouth daily.     oxyCODONE  (ROXICODONE ) 5 MG immediate release tablet Take 0.5 tablets (2.5 mg total) by mouth every 4 (four) hours as needed for severe pain (pain score 7-10). 12 tablet 0   phentermine 15 MG capsule Take 15 mg by mouth daily.     potassium chloride  (KLOR-CON ) 10 MEQ tablet Take 10 mEq by mouth daily.     prazosin  (MINIPRESS ) 1 MG capsule Take 3 capsules (3 mg total) by mouth at bedtime. 90 capsule 1   sertraline  (ZOLOFT ) 100 MG tablet Take 2 tablets (200 mg total) by mouth daily. 60 tablet 2   traZODone  (DESYREL ) 100 MG tablet Take 3 tablets (300 mg total)  by mouth at bedtime as needed for sleep. 90 tablet 2   UBRELVY 100 MG TABS Take by mouth.     WEGOVY 0.5 MG/0.5ML SOAJ Inject 0.5 mg into the skin once a week.     No current facility-administered medications for this visit.    ROS: Review of Systems  All other systems reviewed and are negative.   Objective:  Objective: Psychiatric Specialty Exam: General Appearance: Casual, fairly groomed  Eye Contact:  Good    Speech:  Clear, coherent, normal rate, spontaneous  Volume:  Normal   Mood: Depressed, anxious  Affect:constricted  Thought Content: Logical  Suicidal Thoughts: see subjective  Thought Process:  Coherent, linear  Orientation:  A&Ox4   Memory:  Immediate good  Judgment:  Fair  Insight:  Fair  Concentration:  Attention and concentration good   Recall:  Good  Fund of Knowledge: Good  Language: Good, fluent  Psychomotor Activity: Normal  Akathisia:  NA   AIMS (if indicated): NA   Assets:   Communication Skills Desire for Improvement Resilience Social Support  ADL's:  Intact  Cognition: WNL  Sleep: see above  Appetite: see above    Physical Exam Vitals reviewed.  Constitutional:      Appearance: Normal appearance.  HENT:     Head: Normocephalic and atraumatic.  Eyes:     Extraocular Movements: Extraocular movements intact.     Conjunctiva/sclera: Conjunctivae normal.  Pulmonary:     Effort: Pulmonary effort is normal.  Musculoskeletal:        General: Normal range of motion.  Skin:    General: Skin is warm and dry.  Neurological:     General: No focal deficit present.     Mental Status: She is alert.      Metabolic Disorder Labs: No results found for: HGBA1C, MPG No results found for: PROLACTIN Lab Results  Component Value Date   CHOL 250 (H) 01/29/2016   TRIG 87.0 01/29/2016   HDL 65.40 01/29/2016   CHOLHDL 4 01/29/2016   VLDL 17.4 01/29/2016   LDLCALC 167 (H) 01/29/2016   Lab Results  Component Value Date   TSH 1.36  06/30/2015   TSH 4.132 08/24/2011    Therapeutic Level Labs: No results found for: LITHIUM Lab Results  Component Value Date   VALPROATE 32.4 (L) 10/14/2009   VALPROATE 68.2 10/19/2008   Lab Results  Component Value Date   CBMZ 8.7 10/14/2015    Screenings:  AIMS    Flowsheet Row [facility]  AIMS Total Score 0   AUDIT    Flowsheet Row [facility]  Alcohol Use Disorder Identification Test Final Score (AUDIT) 0   GAD-7    Flowsheet Row [facility] Counselor from 03/03/2023 in [facility]  Total GAD-7 Score 17 20   PHQ2-9    Flowsheet Row [facility] Visit [facility] 10[facility]24[facility]2023 [facility] Counselor from 03/03/2023 in [facility] from 12/09/2022 in [facility] Office Visit from 02/12/2016 in Primary Care at [facility] Visit from 06/30/2015 in Primary Care at [facility] Total Score 6 6 6  0 2  PHQ-9 Total Score 22 21 20  -- 19   Flowsheet Row ED from 12/09/2023 in [facility] Emergency Department at [facility] Counselor from 03/03/2023 in [facility] ED from 01/14/2023 in [facility] Department at [facility]  C-SSRS RISK CATEGORY No Risk Low Risk No Risk    Collaboration of Care:   Patient/Guardian was advised Release of Information must be obtained prior to any record release in order to collaborate their care with an outside provider. Patient/Guardian was advised if they have not already done so to contact the registration department to sign all necessary forms in order for us  to release information regarding their care.    Consent: Patient/Guardian gives verbal consent for treatment and assignment of benefits for services provided during this visit. Patient/Guardian expressed understanding and agreed to proceed.    Marlo Masson, MD 12/12/2023, 4:37 PM

## 2023-12-12 ENCOUNTER — Encounter (HOSPITAL_COMMUNITY): Payer: Self-pay | Admitting: Student in an Organized Health Care Education/Training Program

## 2023-12-12 ENCOUNTER — Ambulatory Visit (HOSPITAL_BASED_OUTPATIENT_CLINIC_OR_DEPARTMENT_OTHER): Payer: PRIVATE HEALTH INSURANCE | Admitting: Student in an Organized Health Care Education/Training Program

## 2023-12-12 ENCOUNTER — Other Ambulatory Visit (HOSPITAL_COMMUNITY): Payer: Self-pay | Admitting: Family

## 2023-12-12 DIAGNOSIS — G4709 Other insomnia: Secondary | ICD-10-CM

## 2023-12-12 DIAGNOSIS — F4381 Prolonged grief disorder: Secondary | ICD-10-CM

## 2023-12-12 DIAGNOSIS — F411 Generalized anxiety disorder: Secondary | ICD-10-CM

## 2023-12-12 DIAGNOSIS — R4589 Other symptoms and signs involving emotional state: Secondary | ICD-10-CM | POA: Diagnosis not present

## 2023-12-12 DIAGNOSIS — F3130 Bipolar disorder, current episode depressed, mild or moderate severity, unspecified: Secondary | ICD-10-CM

## 2023-12-12 MED ORDER — SERTRALINE HCL 100 MG PO TABS: 200.0000 mg | ORAL_TABLET | Freq: Every day | ORAL | 2 refills | Status: DC

## 2023-12-12 MED ORDER — LORAZEPAM 0.5 MG PO TABS: 0.5000 mg | ORAL_TABLET | Freq: Every day | ORAL | 0 refills | Status: DC | PRN

## 2023-12-12 MED ORDER — PRAZOSIN HCL 1 MG PO CAPS: 3.0000 mg | ORAL_CAPSULE | Freq: Every day | ORAL | 1 refills | Status: DC

## 2023-12-12 MED ORDER — ARIPIPRAZOLE 5 MG PO TABS: 5.0000 mg | ORAL_TABLET | Freq: Every day | ORAL | 0 refills | Status: DC

## 2023-12-20 ENCOUNTER — Telehealth (HOSPITAL_COMMUNITY): Payer: Self-pay | Admitting: *Deleted

## 2023-12-20 DIAGNOSIS — G4709 Other insomnia: Secondary | ICD-10-CM

## 2023-12-20 NOTE — Telephone Encounter (Signed)
 Pt is requesting to start Remeron  now as she is still experiencing insomnia and says she cannot wait until next visit (12/26/23). Please review and advise.

## 2023-12-21 MED ORDER — MIRTAZAPINE 7.5 MG PO TABS: 7.5000 mg | ORAL_TABLET | Freq: Every day | ORAL | 0 refills | Status: DC

## 2023-12-21 NOTE — Telephone Encounter (Signed)
 Contacted and spoke with patient at [phone] regarding request for prescription of Remeron .  Advised to discontinue nightly trazodone  300 mg and initiate Remeron  7.5 mg nightly. Reviewed rationale and potential side effects; all questions addressed and patient verbalized understanding.  Patient has upcoming follow-up appointment with me on 12/26/2023  Prescription sent during this encounter: Remeron  7.5 mg nightly, 30 tablets, 0 refills   Alga Southall Carrin Carrero, MD PGY-3, [facility]

## 2023-12-23 ENCOUNTER — Ambulatory Visit (INDEPENDENT_AMBULATORY_CARE_PROVIDER_SITE_OTHER): Payer: PRIVATE HEALTH INSURANCE | Admitting: Clinical

## 2023-12-23 ENCOUNTER — Encounter (HOSPITAL_COMMUNITY): Payer: Self-pay | Admitting: Clinical

## 2023-12-23 DIAGNOSIS — F411 Generalized anxiety disorder: Secondary | ICD-10-CM | POA: Diagnosis not present

## 2023-12-23 DIAGNOSIS — F3130 Bipolar disorder, current episode depressed, mild or moderate severity, unspecified: Secondary | ICD-10-CM

## 2023-12-23 DIAGNOSIS — F4381 Prolonged grief disorder: Secondary | ICD-10-CM | POA: Diagnosis not present

## 2023-12-23 NOTE — Progress Notes (Signed)
 THERAPIST PROGRESS NOTE  Session Time: 10:02am-11:01am  Session #10  Virtual Visit via Video Note  I connected with Stacey Weaver on 12/23/23 at 10:00 AM EDT by a video enabled telemedicine application and verified that I am speaking with the correct person using two identifiers.  Location: Patient: home Provider: home office   I discussed the limitations of evaluation and management by telemedicine and the availability of in person appointments. The patient expressed understanding and agreed to proceed.   I discussed the assessment and treatment plan with the patient. The patient was provided an opportunity to ask questions and all were answered. The patient agreed with the plan and demonstrated an understanding of the instructions.   The patient was advised to call back or seek an in-person evaluation if the symptoms worsen or if the condition fails to improve as anticipated.  I provided 59 minutes of non-face-to-face time during this encounter.  [name], LCSW   Participation Level: Active  Behavioral Response: Casual Alert Euthymic  Type of Therapy: Individual Therapy  Treatment Goals addressed:  STG: Identify and decrease cognitive distortions contributing negatively to mood and behavior by identifying 5-7 cognitive distortions patient has and learning how to come up with replacement thoughts that are more balanced, realistic, and helpful.  LTG: Work to Arts development officer from models like CBT, Stages of Change, DBT, shame resilience theory, ACT, SFBT, MI, trauma-informed therapy and others to be able to manage mental health symptoms, AEB practicing out of session and reporting back.   STG: Patient will work on forgiveness, shame, sleep, relationship to food, or other issues as appropriate and as such issues present during sessions    LTG: Learn and practice communication techniques such as I statements, open-ended questions, reflective listening,  assertiveness, fair fighting rules, initiating conversations, and more as necessary and taught in session   LTG: Score less than 5 on the GAD-7 as evidenced by intermittent administration of the questionnaire to determine progress in management of anxiety.   STG: Manmeet will practice problem solving skills 3 times per week for the next 4 weeks.  LTG: Learn about the feeling of anxiety and its many variations, the cycle of anxiety and how to interrupt that cycle.    STG: Learn breathing techniques and grounding techniques at an age-appropriate level and demonstrate mastery in session then report independent use of these skills out of session.   STG: Learn about boundary types, how to implement them, and how to enforce them so that patient feels more empowered and content with being able to maintain more helpful, appropriate boundaries in the future for a more balanced result.   LTG: Recall traumatic events without becoming overwhelmed with negative emotions STG: [place] will practice emotion regulation skills, distress tolerance skills, mindfulness skills, and interpersonal effectiveness skills weekly for the next 26 week(s)  STG: Report a decrease in PTSD symptoms as evidenced by a 25% reduction in overall score on a clinician administered PTSD assessment screen/scale  LTG: Increase coping skills to manage depression/anxiety/anger and improve ability to perform daily activities as evidenced by fewer depressive episodes and/or anger outbursts and/or anxious days per self-report and collateral information.  STG: Process life events to the extent needed so that will be able to move forward with various areas of life in a better frame of mind per self-report.  ProgressTowards Goals: Progressing  Interventions: Solution Focused and Supportive   Summary: Stacey Weaver is a 48 y.o. female who presents with prolonged grief  and Bipolar 1 disorder. She presented oriented x5 and stated she was feeling  better than last visit.  CSW evaluated patient's medication compliance, use of coping tools, and self-care, as applicable.  She provided an update on various aspects of her life that are normally discussed in therapy, including her greater satisfaction with last doctor's visit in which the attending physician was present and her impression that restarting Abilify  has already started helping her mood.  She continues to be hypersensitive about how things are phrased to her, was reminded that people cannot read her mind that something offends her so she could speak up.  She talked at length about the facade that she puts up in various areas of her life including work especially, saying she does not know what would happened if she dropped that mask.  CSW reminded her that this is another example of living in the unknown future, asking herself What if?  CSW asked her to consider the possibility that what she projects as the possible negative outcome might be not at all what happens, that people might really resonate with the more real self she currently hides.  She shared that she feels stagnant in her life and that she does not know what she wants to do to move forward.  She does know that she wants to be happier and not just exist.  We explored Behavioral Activation at length and she disclosed that when she is outside her apartment, she does not feel safe; however she does get invited to different outings with various friends.  CSW explained the idea of scaling her feelings before and after an outing, then gave her ideas off the 4-page Pleasurable Activities list available to CSW, with a special emphasis on the various categories to pay attention to.  She agreed to give this a try and report back how her scaling before and after goes.  Suicidal/Homicidal: No without intent/plan  Therapist Response:  Patient is progressing AEB engaging in scheduled therapy session.  Throughout the session, CSW gave  patient the opportunity to explore thoughts and feelings associated with current life situations and past/present stressors.   CSW challenged patient gently and appropriately to consider different ways of looking at reported issues. CSW encouraged patient's expression of feelings and validated these using empathy, active listening, open body language, and unconditional positive regard.  Additional appointments were scheduled through the end of the year, especially because her mother died in 04/01/24 and she feels she will need it.   Plan/Recommendations:  Return to therapy in 2 weeks to next scheduled appointment on 10/10, reflect on what was discussed in session, engage in self care behaviors as explored in session, do homework as assigned (choose a few pleasurable activities to do and scale her feelings both before and after), and return to next session prepared to talk about experience with new coping methods.   Diagnosis:  Bipolar I disorder, most recent episode depressed (HCC)  Prolonged grief disorder  Generalized anxiety disorder  Collaboration of Care: Psychiatrist AEB -psychiatrist can read therapy notes; therapist can and does read psychiatric notes prior to sessions   Patient/Guardian was advised Release of Information must be obtained prior to any record release in order to collaborate their care with an outside provider. Patient/Guardian was advised if they have not already done so to contact the registration department to sign all necessary forms in order for us  to release information regarding their care.   Consent: Patient/Guardian gives verbal consent for treatment and assignment  of benefits for services provided during this visit. Patient/Guardian expressed understanding and agreed to proceed.   [name], LCSW 12/23/2023

## 2023-12-26 ENCOUNTER — Telehealth (HOSPITAL_BASED_OUTPATIENT_CLINIC_OR_DEPARTMENT_OTHER): Payer: PRIVATE HEALTH INSURANCE | Admitting: Student in an Organized Health Care Education/Training Program

## 2023-12-26 DIAGNOSIS — G4709 Other insomnia: Secondary | ICD-10-CM | POA: Diagnosis not present

## 2023-12-26 DIAGNOSIS — R4589 Other symptoms and signs involving emotional state: Secondary | ICD-10-CM | POA: Diagnosis not present

## 2023-12-26 DIAGNOSIS — F3130 Bipolar disorder, current episode depressed, mild or moderate severity, unspecified: Secondary | ICD-10-CM | POA: Diagnosis not present

## 2023-12-26 MED ORDER — ARIPIPRAZOLE 5 MG PO TABS: 5.0000 mg | ORAL_TABLET | Freq: Every day | ORAL | 2 refills | Status: DC

## 2023-12-26 MED ORDER — MIRTAZAPINE 7.5 MG PO TABS: 7.5000 mg | ORAL_TABLET | Freq: Every day | ORAL | 0 refills | Status: DC

## 2023-12-26 MED ORDER — CARBAMAZEPINE ER 400 MG PO TB12: 400.0000 mg | ORAL_TABLET | Freq: Two times a day (BID) | ORAL | 2 refills | Status: DC

## 2023-12-26 NOTE — Progress Notes (Cosign Needed)
 BH MD Outpatient Progress Note  12/26/2023 5:00 PM Stacey Weaver  MRN:  [id number]   Virtual Visit via Telephone Note  I connected with Stacey Weaver on 12/26/23 at  2:00 PM EDT by a video enabled telemedicine application and verified that I am speaking with the correct person using two identifiers.  Location: Patient: Home Provider: Office   I discussed the limitations, risks, security and privacy concerns of performing an evaluation and management service by telephone and the availability of in person appointments. I also discussed with the patient that there may be a patient responsible charge related to this service. The patient expressed understanding and agreed to proceed.   I discussed the assessment and treatment plan with the patient. The patient was provided an opportunity to ask questions and all were answered. The patient agreed with the plan and demonstrated an understanding of the instructions.   The patient was advised to call back or seek an in-person evaluation if the symptoms worsen or if the condition fails to improve as anticipated.   Marlo Masson, MD Psych Resident, PGY-3    Assessment:  Stacey Weaver presents for follow-up evaluation on 12/26/23 .  At today's follow-up, the patient reports improvement in mood stability since initiation of Abilify .  Remeron  was started last week after telephone encounter for insomnia refractory to trazodone ; the patient notes modest improvement in sleep initiation since switching his medication but continues to experience frequent nighttime awakenings, which she attributes to chronic back pain.    At this time no changes will be made to patient's psychotropic regiment, appropriate to continue at current doses.   Identifying Information: Stacey Weaver is a 48 y.o. female with a history of Bipolar 1 disorder, insomnia, GAD, prolonged grief disorder who is an established patient with [facility] for management of mood and insomnia. She is a former patient of Dr. Rainelle.  For a comprehensive history and detailed assessment, please refer to the initial adult assessment.  Sees Grossman-Orr, Elgie PARAS, LCSW for therapy   Plan:  # Bipolar 1 Disorder #GAD Past medication trials: Depakote, Abilify , Lamictal , Prozac , Cymbalta Status of problem: Current episode depressed Interventions: --Continue Tegretol  400 mg BID for mood stabilization --Continue Zoloft  200 mg daily to better target depression and anxiety --Continue abilify  5 mg for improved mood stability     # Prolonged grief disorder #Other insomnia #R/o PTSD Past medication trials: ambien  stopped due to futility; seroquel, abilify , geodon (worsened hallucinations), zyprexa (stopped for hallucinations), ambien , valium , gabapentin (patient reported bad reaction to this),  Status of problem: Ongoing Interventions: -- Continue ativan  0.5 daily PRN, 5 tablets monthly --Continue prazosin  3 mg nightly for nightmares --Continue trazodone  300 mg nightly,  EKG from June 2024 reviewed showing NSR QTc 403 Consider transition to Remeron  7.5 mg at next visit if insomnia has not improved --Continue therapy with [facility] maintenance, PCP: Douglass Gerard Dross, PA-C  Weight loss --phentermine 50 mg daily, Wegovy  Patient was given contact information for behavioral health clinic and was instructed to call 911 for emergencies.   Subjective:  Chief Complaint:  Chief Complaint  Patient presents with   Follow-up    Interval History:  Patient reports that Remeron  was started one week ago and has helped with sleep onset, though she continues to experience frequent nighttime awakenings. She believes her chronic pain may be contributing to these awakenings and expresses a desire to continue the medication. She describes ongoing nightmares. She reports  a reduction in talkativeness and impulsivity since the last  visit.  Patient has not tried the prescribed Ativan , stating, I don't want to run out if I need something. She denies adverse effects to medications and reports taking all medications as directed without missing doses.  She describes poor appetite, stating, I eat when I'm hungry, and supplements meals with protein shakes when unable to eat. She reports consuming one cup of coffee at work in the morning and one small Coke daily. She denies recent substance use.  She denies suicidal ideations.  Visit Diagnosis:    ICD-10-CM   1. Bipolar I disorder, most recent episode depressed (HCC)  F31.30 ARIPiprazole  (ABILIFY ) 5 MG tablet    carbamazepine  (TEGRETOL  XR) 400 MG 12 hr tablet    2. Difficulty coping  R45.89 carbamazepine  (TEGRETOL  XR) 400 MG 12 hr tablet    3. Other insomnia  G47.09 mirtazapine  (REMERON ) 7.5 MG tablet       Past Psychiatric History:  Diagnoses: Bipolar 1 disorder, Medication trials: Haldol, Abilify  (previously discontinued due to cost), Klonopin , Lamictal , Wellbutrin , Prozac , Cymbalta (experienced muscle spasms and suicidal thoughts), Geodon and Zyprexa (experienced hallucinations), Navane , Depakote  Previous psychiatrist/therapist: [facility] with Dr. Donn as her outpatient psychiatrist Hospitalizations: [facility] in 2009, multiple times in 2010, 2012, 2017 Suicide attempts: 2012-overdose of Wellbutrin  150 mg; overdose in 2013 on Wellbutrin  and Benadryl  SIB: Denies Hx of violence towards others: Denies Current access to guns: Denies Hx of trauma/abuse: Denies    Social History   Socioeconomic History   Marital status: Single    Spouse name: Not on file   Number of children: Not on file   Years of education: Not on file   Highest education level: Not on file  Occupational History   Not on file  Tobacco Use   Smoking status: Never   Smokeless tobacco: Never  Substance and Sexual Activity   Alcohol  use: No   Drug use: No   Sexual activity: Yes    Birth control/protection: I.U.D.  Other Topics Concern   Not on file  Social History Narrative   Not on file   Social Drivers of Health   Financial Resource Strain: Not on file  Food Insecurity: Low Risk  (08/10/2023)   Received from [facility]   Hunger Vital Sign    Within the past 12 months, you worried that your food would run out before you got money to buy more: Never true    Within the past 12 months, the food you bought just didn't last and you didn't have money to get more: Not on file  Recent Concern: Food Insecurity - Medium Risk (08/10/2023)   Received from [facility]   Hunger Vital Sign    Within the past 12 months, you worried that your food would run out before you got money to buy more: Never true    Within the past 12 months, the food you bought just didn't last and you didn't have money to get more. : Sometimes true  Transportation Needs: Not on file (11/04/2022)  Physical Activity: Not on file  Stress: Not on file  Social Connections: Unknown (08/04/2021)   Received from [facility]   Social Network    Social Network: Not on file    Allergies:  Allergies  Allergen Reactions   Almotriptan Malate Shortness Of Breath and Other (See Comments)    Muscle spasms   Cymbalta [Duloxetine Hcl] Other (See  Comments)    Other reaction(s): Other Muscle spasms suicidal thoughts   Imitrex [Sumatriptan] Shortness Of Breath, Other (See Comments) and Palpitations    Muscle spasms   Prednisone Other (See Comments) and Palpitations   Zomig Shortness Of Breath   Geodon [Ziprasidone Hcl] Other (See Comments)    hallucinations   Lactose Intolerance (Gi) Diarrhea and Other (See Comments)    Gas and bloated    Penicillins Other (See Comments)    GI upset  Has patient had a PCN reaction causing immediate rash, facial/tongue/throat swelling, SOB or lightheadedness with hypotension: No Has patient had a PCN reaction causing  severe rash involving mucus membranes or skin necrosis: No Has patient had a PCN reaction that required hospitalization No Has patient had a PCN reaction occurring within the last 10 years: Yes If all of the above answers are NO, then may proceed with Cephalosporin use.    Zyprexa [Olanzapine] Other (See Comments)    Hallucinations     Current Medications: Current Outpatient Medications  Medication Sig Dispense Refill   ARIPiprazole  (ABILIFY ) 5 MG tablet Take 1 tablet (5 mg total) by mouth daily. 30 tablet 2   atorvastatin (LIPITOR) 10 MG tablet Take 10 mg by mouth daily.     carbamazepine  (TEGRETOL  XR) 400 MG 12 hr tablet Take 1 tablet (400 mg total) by mouth 2 (two) times daily. 60 tablet 2   Erenumab-aooe (AIMOVIG) 70 MG/ML SOAJ Inject 70 mg/mL as directed every 30 (thirty) days.     LORazepam  (ATIVAN ) 0.5 MG tablet Take 1 tablet (0.5 mg total) by mouth daily as needed for anxiety. 5 tablet 0   metoprolol succinate (TOPROL-XL) 25 MG 24 hr tablet Take 1 tablet by mouth daily.     mirtazapine  (REMERON ) 7.5 MG tablet Take 1 tablet (7.5 mg total) by mouth at bedtime. 30 tablet 0   oxyCODONE  (ROXICODONE ) 5 MG immediate release tablet Take 0.5 tablets (2.5 mg total) by mouth every 4 (four) hours as needed for severe pain (pain score 7-10). 12 tablet 0   phentermine 15 MG capsule Take 15 mg by mouth daily.     potassium chloride  (KLOR-CON ) 10 MEQ tablet Take 10 mEq by mouth daily.     prazosin  (MINIPRESS ) 1 MG capsule Take 3 capsules (3 mg total) by mouth at bedtime. 90 capsule 1   sertraline  (ZOLOFT ) 100 MG tablet Take 2 tablets (200 mg total) by mouth daily. 60 tablet 2   UBRELVY 100 MG TABS Take by mouth.     WEGOVY 0.5 MG/0.5ML SOAJ Inject 0.5 mg into the skin once a week.     No current facility-administered medications for this visit.    ROS: Review of Systems  All other systems reviewed and are negative.   Objective:  Objective: Psychiatric Specialty Exam: General  Appearance: Casual, fairly groomed  Eye Contact:  Good    Speech:  Clear, coherent, normal rate, spontaneous  Volume:  Normal   Mood: Depressed, anxious  Affect:constricted  Thought Content: Logical  Suicidal Thoughts: see subjective  Thought Process:  Coherent, linear  Orientation:  A&Ox4   Memory:  Immediate good  Judgment:  Fair   Insight:  Fair  Concentration:  Attention and concentration good   Recall:  Good  Fund of Knowledge: Good  Language: Good, fluent  Psychomotor Activity: Normal  Akathisia:  NA   AIMS (if indicated): NA   Assets:   Communication Skills Desire for Improvement Resilience Social Support  ADL's:  Intact  Cognition: WNL  Sleep: see above  Appetite: see above    Physical Exam Vitals reviewed.  Constitutional:      Appearance: Normal appearance.  HENT:     Head: Normocephalic and atraumatic.  Eyes:     Extraocular Movements: Extraocular movements intact.     Conjunctiva/sclera: Conjunctivae normal.  Pulmonary:     Effort: Pulmonary effort is normal.  Musculoskeletal:        General: Normal range of motion.  Skin:    General: Skin is warm and dry.  Neurological:     General: No focal deficit present.     Mental Status: She is alert.      Metabolic Disorder Labs: No results found for: HGBA1C, MPG No results found for: PROLACTIN Lab Results  Component Value Date   CHOL 250 (H) 01/29/2016   TRIG 87.0 01/29/2016   HDL 65.40 01/29/2016   CHOLHDL 4 01/29/2016   VLDL 17.4 01/29/2016   LDLCALC 167 (H) 01/29/2016   Lab Results  Component Value Date   TSH 1.36 06/30/2015   TSH 4.132 08/24/2011    Therapeutic Level Labs: No results found for: LITHIUM Lab Results  Component Value Date   VALPROATE 32.4 (L) 10/14/2009   VALPROATE 68.2 10/19/2008   Lab Results  Component Value Date   CBMZ 8.7 10/14/2015    Screenings:  AIMS    Flowsheet Row [facility]  AIMS Total Score 0   AUDIT    Flowsheet Row Ad[facility] ([facility]  Alcohol Use Disorder Identification Test Final Score (AUDIT) 0   GAD-7    Flowsheet Row [facility] Counselor from 03/03/2023 in [facility]  Total GAD-7 Score 17 20   PHQ2-9    Flowsheet Row Office Visit [facility] 10[facility]24[facility]2023 [facility] Counselor from 03/03/2023 in [facility] from 12/09/2022 in [facility] Office Visit from 02/12/2016 in [facility] at [facility] Visit from 06/30/2015 in Primary Care at [facility] Total Score 6 6 6  0 2  PHQ-9 Total Score 22 21 20  -- 19   Flowsheet Row ED from 12/09/2023 in [facility] Department at [facility] Counselor from 03/03/2023 in [facility] ED from 01/14/2023 in [facility] Department at [facility]  C-SSRS RISK CATEGORY No Risk Low Risk No Risk    Collaboration of Care:   Patient/Guardian was advised Release of Information must be obtained prior to any record release in order to collaborate their care with an outside provider. Patient/Guardian was advised if they have not already done so to contact the registration department to sign all necessary forms in order for us  to release information regarding their care.   Consent: Patient/Guardian gives verbal consent for treatment and assignment of benefits for services provided during this visit. Patient/Guardian expressed understanding and agreed to proceed.    Marlo Masson, MD 12/26/2023, 5:00 PM

## 2023-12-27 NOTE — Addendum Note (Signed)
 Addended by: CARVIN CROCK on: 12/27/2023 12:10 PM   Modules accepted: Level of Service

## 2024-01-06 ENCOUNTER — Ambulatory Visit (HOSPITAL_COMMUNITY): Payer: PRIVATE HEALTH INSURANCE | Admitting: Clinical

## 2024-01-06 ENCOUNTER — Encounter (HOSPITAL_COMMUNITY): Payer: Self-pay

## 2024-01-06 NOTE — Progress Notes (Signed)
 Patient could not attend video session because she is in the hospital.  Elgie Crest, LCSW 01/06/2024, 11:19 AM   Encounter Diagnosis  Name Primary?   Failure to attend appointment with reason given Yes

## 2024-01-16 ENCOUNTER — Telehealth (HOSPITAL_COMMUNITY): Payer: Self-pay | Admitting: *Deleted

## 2024-01-16 NOTE — Telephone Encounter (Signed)
 Pt called requesting a refill of Xanax and Zoloft . There are refills of the Zoloft  but Xanax is from 2024. Ativan  prescribed on 12/12/23. Please review.   Last visit: 12/12/23 Next visit: 01/25/24

## 2024-01-16 NOTE — Telephone Encounter (Signed)
 Called and spoke with patient at [phone] to discuss refill request.  Patient confirms she was able to get her refill of Zoloft  200 mg daily.  The patient's dispense history was reviewed, the patient's last refill ends 03/11/2024.  Regarding the patient's request for lorazepam  she reports she has used 2 tablets this past month.  Support and reassurance provided, patient reports feeling confident that she does not require refill of alprazolam at this time, but will contact this provider for any worsening anxiety prior to her appointment on 01/25/2024.    Tashyra Adduci Carrin Carrero, MD PGY-3, [facility]

## 2024-01-17 ENCOUNTER — Encounter (HOSPITAL_COMMUNITY): Payer: Self-pay | Admitting: Clinical

## 2024-01-17 NOTE — Progress Notes (Signed)
 Duplicate entered by mistake

## 2024-01-20 ENCOUNTER — Encounter (HOSPITAL_COMMUNITY): Payer: Self-pay | Admitting: Clinical

## 2024-01-20 ENCOUNTER — Ambulatory Visit (INDEPENDENT_AMBULATORY_CARE_PROVIDER_SITE_OTHER): Payer: PRIVATE HEALTH INSURANCE | Admitting: Clinical

## 2024-01-20 DIAGNOSIS — F411 Generalized anxiety disorder: Secondary | ICD-10-CM | POA: Diagnosis not present

## 2024-01-20 DIAGNOSIS — F3130 Bipolar disorder, current episode depressed, mild or moderate severity, unspecified: Secondary | ICD-10-CM | POA: Diagnosis not present

## 2024-01-20 DIAGNOSIS — F4381 Prolonged grief disorder: Secondary | ICD-10-CM

## 2024-01-20 NOTE — Progress Notes (Signed)
 THERAPIST PROGRESS NOTE  Session Time: 11:04am-11:59am  Session #11  Virtual Visit via Video Note  I connected with Stacey Weaver on 01/22/24 at 11:00 AM EDT by a video enabled telemedicine application and verified that I am speaking with the correct person using two identifiers.  Location: Patient: home Provider: home office   I discussed the limitations of evaluation and management by telemedicine and the availability of in person appointments. The patient expressed understanding and agreed to proceed.   I discussed the assessment and treatment plan with the patient. The patient was provided an opportunity to ask questions and all were answered. The patient agreed with the plan and demonstrated an understanding of the instructions.   The patient was advised to call back or seek an in-person evaluation if the symptoms worsen or if the condition fails to improve as anticipated.  I provided 55 minutes of non-face-to-face time during this encounter.  Stacey JINNY Crest, LCSW   Participation Level: Active  Behavioral Response: Casual Alert Euthymic   Type of Therapy: Individual Therapy  Treatment Goals addressed:  STG: Identify and decrease cognitive distortions contributing negatively to mood and behavior by identifying 5-7 cognitive distortions patient has and learning how to come up with replacement thoughts that are more balanced, realistic, and helpful.  LTG: Work to arts development officer from models like CBT, Stages of Change, DBT, shame resilience theory, ACT, SFBT, MI, trauma-informed therapy and others to be able to manage mental health symptoms, AEB practicing out of session and reporting back.   STG: Patient will work on forgiveness, shame, sleep, relationship to food, or other issues as appropriate and as such issues present during sessions    LTG: Learn and practice communication techniques such as I statements, open-ended questions, reflective listening,  assertiveness, fair fighting rules, initiating conversations, and more as necessary and taught in session   LTG: Score less than 5 on the GAD-7 as evidenced by intermittent administration of the questionnaire to determine progress in management of anxiety.   STG: Stacey Weaver will practice problem solving skills 3 times per week for the next 4 weeks.  LTG: Learn about the feeling of anxiety and its many variations, the cycle of anxiety and how to interrupt that cycle.    STG: Learn breathing techniques and grounding techniques at an age-appropriate level and demonstrate mastery in session then report independent use of these skills out of session.   STG: Learn about boundary types, how to implement them, and how to enforce them so that patient feels more empowered and content with being able to maintain more helpful, appropriate boundaries in the future for a more balanced result.   LTG: Recall traumatic events without becoming overwhelmed with negative emotions STG: Stacey Weaver will practice emotion regulation skills, distress tolerance skills, mindfulness skills, and interpersonal effectiveness skills weekly for the next 26 week(s)  STG: Report a decrease in PTSD symptoms as evidenced by a 25% reduction in overall score on a clinician administered PTSD assessment screen/scale  LTG: Increase coping skills to manage depression/anxiety/anger and improve ability to perform daily activities as evidenced by fewer depressive episodes and/or anger outbursts and/or anxious days per self-report and collateral information.  STG: Process life events to the extent needed so that will be able to move forward with various areas of life in a better frame of mind per self-report.  ProgressTowards Goals: Progressing  Interventions: Supportive, Reframing, and Other: trauma   Summary: Stacey Weaver is a 48 y.o. female who presents with prolonged  grief and Bipolar 1 disorder. She presented oriented x5 and stated she was  feeling good, the Abilify  is helping.  CSW evaluated patient's medication compliance, use of coping tools, and self-care, as applicable.  She provided an update on various aspects of her life that are normally discussed in therapy, including her progress on current medicine, her ongoing lack of sleep, pain coming from back, her anxiety about this time of year, and her progress in using various skills to deal with issues.  We processed her anticipatory anxiety based on this being the time of year she suffered her losses of mother and grandmother.  She acknowledged that even when mom was alive, she never lived for today so we discussed the futility of doing otherwise.  She has been considering the Pleasant Activities list and bought herself a planner to work on because she likes making lists, which makes her feel good.  She kept coming back to the idea that she does not know how to stay focused on the moment.  CSW described various grounding methods that can be employed over and over and talked with her about the need to retrain her brain, that it is not as simple as saying you're going to do something differently.  She acknowledged that even though she did not like the binaural beats at first, she has kept with it and is now finding it very relaxing and helpful.  CSW gave her kudos and positive strokes for her openness and ongoing efforts, encouraged her to keep up the efforts to orient herself to the present moment  She questioned why she has to retrain her brain at 48yo, which was responded to with honesty and kindness.  She shared that with the anniversaries of her mother's and grandmother's deaths coming up, this can be overwhelming, but acknowledged that right now the thoughts are less and are not consuming her anymore.  She stated that what happened was horrible and she will never forget it, but now it is tempered.  CSW gave her positive strokes.  Suicidal/Homicidal: No without intent/plan  Therapist  Response:  Patient is progressing AEB engaging in scheduled therapy session.  Throughout the session, CSW gave patient the opportunity to explore thoughts and feelings associated with current life situations and past/present stressors.   CSW challenged patient gently and appropriately to consider different ways of looking at reported issues. CSW encouraged patient's expression of feelings and validated these using empathy, active listening, open body language, and unconditional positive regard.   Plan/Recommendations:  Return to therapy in 2 weeks to next scheduled appointment on 11/11, reflect on what was discussed in session, engage in self care behaviors as explored in session, do homework as assigned (continue using CBT to combat anticipatory anxiety), and return to next session prepared to talk about experience with new coping methods.   Diagnosis:  Bipolar I disorder, most recent episode depressed (HCC)  Prolonged grief disorder  Generalized anxiety disorder  Collaboration of Care: Psychiatrist AEB -psychiatrist can read therapy notes; therapist can and does read psychiatric notes prior to sessions   Patient/Guardian was advised Release of Information must be obtained prior to any record release in order to collaborate their care with an outside provider. Patient/Guardian was advised if they have not already done so to contact the registration department to sign all necessary forms in order for us  to release information regarding their care.   Consent: Patient/Guardian gives verbal consent for treatment and assignment of benefits for services provided during this visit.  Patient/Guardian expressed understanding and agreed to proceed.   Stacey JINNY Crest, LCSW 01/22/2024

## 2024-01-22 ENCOUNTER — Encounter (HOSPITAL_COMMUNITY): Payer: Self-pay | Admitting: Clinical

## 2024-01-24 ENCOUNTER — Telehealth (HOSPITAL_COMMUNITY): Payer: Self-pay | Admitting: *Deleted

## 2024-01-24 NOTE — Telephone Encounter (Signed)
 Pt called regarding Ativan  prescription. She stated that the [facility] on [address] does not have a prescription on file. Last Lorazepam  0.5 mg script was e-scribed on 12/12/23 #5.

## 2024-01-24 NOTE — Progress Notes (Unsigned)
 BH MD Outpatient Progress Note  01/24/2024 4:32 PM Stacey Weaver  MRN:  [id number]   Virtual Visit via Telephone Note  I connected with Stacey Weaver on 01/24/24 at 11:00 AM EDT by a video enabled telemedicine application and verified that I am speaking with the correct person using two identifiers.  Location: Patient: Home *** Provider: Office ***   I discussed the limitations, risks, security and privacy concerns of performing an evaluation and management service by telephone and the availability of in person appointments. I also discussed with the patient that there may be a patient responsible charge related to this service. The patient expressed understanding and agreed to proceed.   I discussed the assessment and treatment plan with the patient. The patient was provided an opportunity to ask questions and all were answered. The patient agreed with the plan and demonstrated an understanding of the instructions.   The patient was advised to call back or seek an in-person evaluation if the symptoms worsen or if the condition fails to improve as anticipated.   Marlo Masson, MD Psych Resident, PGY-3    Assessment:  Stacey Weaver presents for follow-up evaluation on 01/24/24 .  *** At today's follow-up, the patient reports improvement in mood stability since initiation of Abilify .  Remeron  was started last week after telephone encounter for insomnia refractory to trazodone ; the patient notes modest improvement in sleep initiation since switching his medication but continues to experience frequent nighttime awakenings, which she attributes to chronic back pain.    At this time no changes will be made to patient's psychotropic regiment, appropriate to continue at current doses.   Identifying Information: Stacey Weaver is a 48 y.o. female with a history of Bipolar 1 disorder, insomnia, GAD, prolonged grief disorder who is an established patient with  [facility] for management of mood and insomnia. She is a former patient of Dr. Rainelle.  For a comprehensive history and detailed assessment, please refer to the initial adult assessment.  Sees Grossman-Orr, Elgie PARAS, LCSW for therapy   Plan:  # Bipolar 1 Disorder #GAD Past medication trials: Depakote, Abilify , Lamictal , Prozac , Cymbalta Status of problem: Current episode depressed Interventions: --***Continue Tegretol  400 mg BID for mood stabilization --***Continue Zoloft  200 mg daily to better target depression and anxiety --***Continue abilify  5 mg for improved mood stability     # Prolonged grief disorder #Other insomnia #R/o PTSD Past medication trials: ambien  stopped due to futility; seroquel, abilify , geodon (worsened hallucinations), zyprexa (stopped for hallucinations), ambien , valium , gabapentin (patient reported bad reaction to this),  Status of problem: Ongoing Interventions: -- ***Continue ativan  0.5 daily PRN, 5 tablets monthly -- ***Continue prazosin  3 mg nightly for nightmares -- ***Continue Remeron  7.5 mg nightly, will reevaluate response at next follow up appointment -- ***Discontinued Trazodone  --***Continue therapy with [facility] maintenance, PCP: Douglass Gerard Dross, PA-C  Weight loss --phentermine 50 mg daily, Wegovy  Patient was given contact information for behavioral health clinic and was instructed to call 911 for emergencies.   Subjective:  Chief Complaint:  No chief complaint on file.   Interval History:   *** Depression: Anxiety: AEs to medications: Medication compliance (missing doses, taking as directed):  Sleep: Appetite: Caffeine: Recent substance use: SI: HI: AVH:    *** Patient reports that Remeron  was started one week ago and has helped with sleep onset, though she continues to experience frequent nighttime awakenings. She believes her chronic pain may be contributing to these awakenings  and  expresses a desire to continue the medication. She describes ongoing nightmares. She reports a reduction in talkativeness and impulsivity since the last visit.  Patient has not tried the prescribed Ativan , stating, I don't want to run out if I need something. She denies adverse effects to medications and reports taking all medications as directed without missing doses.  She describes poor appetite, stating, I eat when I'm hungry, and supplements meals with protein shakes when unable to eat. She reports consuming one cup of coffee at work in the morning and one small Coke daily. She denies recent substance use.  She denies suicidal ideations.  Visit Diagnosis:  No diagnosis found.    Past Psychiatric History:  Diagnoses: Bipolar 1 disorder, Medication trials: Haldol, Abilify  (previously discontinued due to cost), Klonopin , Lamictal , Wellbutrin , Prozac , Cymbalta (experienced muscle spasms and suicidal thoughts), Geodon and Zyprexa (experienced hallucinations), Navane , Depakote  Previous psychiatrist/therapist: [facility] with Dr. Donn as her outpatient psychiatrist Hospitalizations: [facility] in 2009, multiple times in 2010, 2012, 2017 Suicide attempts: 2012-overdose of Wellbutrin  150 mg; overdose in 2013 on Wellbutrin  and Benadryl  SIB: Denies Hx of violence towards others: Denies Current access to guns: Denies Hx of trauma/abuse: Denies    Social History   Socioeconomic History   Marital status: Single    Spouse name: Not on file   Number of children: Not on file   Years of education: Not on file   Highest education level: Not on file  Occupational History   Not on file  Tobacco Use   Smoking status: Never   Smokeless tobacco: Never  Substance and Sexual Activity   Alcohol use: No   Drug use: No   Sexual activity: Yes    Birth control/protection: I.U.D.  Other Topics Concern   Not on file  Social History  Narrative   Not on file   Social Drivers of Health   Financial Resource Strain: Not on file  Food Insecurity: Low Risk  (08/10/2023)   Received from [facility]   Hunger Vital Sign    Within the past 12 months, you worried that your food would run out before you got money to buy more: Never true    Within the past 12 months, the food you bought just didn't last and you didn't have money to get more: Not on file  Recent Concern: Food Insecurity - Medium Risk (08/10/2023)   Received from [facility]   Hunger Vital Sign    Within the past 12 months, you worried that your food would run out before you got money to buy more: Never true    Within the past 12 months, the food you bought just didn't last and you didn't have money to get more. : Sometimes true  Transportation Needs: Not on file (11/04/2022)  Physical Activity: Not on file  Stress: Not on file  Social Connections: Unknown (08/04/2021)   Received from [facility]   Social Network    Social Network: Not on file    Allergies:  Allergies  Allergen Reactions   Almotriptan Malate Shortness Of Breath and Other (See Comments)    Muscle spasms   Cymbalta [Duloxetine Hcl] Other (See Comments)    Other reaction(s): Other Muscle spasms suicidal thoughts   Imitrex [Sumatriptan] Shortness Of Breath, Other (See Comments) and Palpitations    Muscle spasms   Prednisone Other (See Comments) and Palpitations   Zomig Shortness Of Breath   Geodon [Ziprasidone Hcl]  Other (See Comments)    hallucinations   Lactose Intolerance (Gi) Diarrhea and Other (See Comments)    Gas and bloated    Penicillins Other (See Comments)    GI upset  Has patient had a PCN reaction causing immediate rash, facial/tongue/throat swelling, SOB or lightheadedness with hypotension: No Has patient had a PCN reaction causing severe rash involving mucus membranes or skin necrosis: No Has patient had a PCN reaction that required hospitalization No Has patient had a  PCN reaction occurring within the last 10 years: Yes If all of the above answers are NO, then may proceed with Cephalosporin use.    Zyprexa [Olanzapine] Other (See Comments)    Hallucinations     Current Medications: Current Outpatient Medications  Medication Sig Dispense Refill   ARIPiprazole  (ABILIFY ) 5 MG tablet Take 1 tablet (5 mg total) by mouth daily. 30 tablet 2   atorvastatin (LIPITOR) 10 MG tablet Take 10 mg by mouth daily.     carbamazepine  (TEGRETOL  XR) 400 MG 12 hr tablet Take 1 tablet (400 mg total) by mouth 2 (two) times daily. 60 tablet 2   Erenumab-aooe (AIMOVIG) 70 MG/ML SOAJ Inject 70 mg/mL as directed every 30 (thirty) days.     LORazepam  (ATIVAN ) 0.5 MG tablet Take 1 tablet (0.5 mg total) by mouth daily as needed for anxiety. 5 tablet 0   metoprolol succinate (TOPROL-XL) 25 MG 24 hr tablet Take 1 tablet by mouth daily.     mirtazapine  (REMERON ) 7.5 MG tablet Take 1 tablet (7.5 mg total) by mouth at bedtime. 30 tablet 0   oxyCODONE  (ROXICODONE ) 5 MG immediate release tablet Take 0.5 tablets (2.5 mg total) by mouth every 4 (four) hours as needed for severe pain (pain score 7-10). 12 tablet 0   phentermine 15 MG capsule Take 15 mg by mouth daily.     potassium chloride  (KLOR-CON ) 10 MEQ tablet Take 10 mEq by mouth daily.     prazosin  (MINIPRESS ) 1 MG capsule Take 3 capsules (3 mg total) by mouth at bedtime. 90 capsule 1   sertraline  (ZOLOFT ) 100 MG tablet Take 2 tablets (200 mg total) by mouth daily. 60 tablet 2   UBRELVY 100 MG TABS Take by mouth.     WEGOVY 0.5 MG/0.5ML SOAJ Inject 0.5 mg into the skin once a week.     No current facility-administered medications for this visit.    ROS: Review of Systems  All other systems reviewed and are negative.   Objective:  Objective: Psychiatric Specialty Exam: General Appearance: Casual, fairly groomed  Eye Contact:  Good    Speech:  Clear, coherent, normal rate, spontaneous***  Volume:  Normal   Mood:  Depressed, anxious***  Affect:constricted  Thought Content: Logical***  Suicidal Thoughts: see subjective  Thought Process:  Coherent, linear  Orientation:  A&Ox4   Memory:  Immediate good  Judgment:  Fair   Insight:  Fair  Concentration:  Attention and concentration good ***  Recall:  Good  Fund of Knowledge: Good  Language: Good, fluent  Psychomotor Activity: Normal  Akathisia:  NA   AIMS (if indicated): NA   Assets:   Communication Skills Desire for Improvement Resilience Social Support  ADL's:  Intact  Cognition: WNL  Sleep: see above  Appetite: see above    Physical Exam Vitals reviewed.  Constitutional:      Appearance: Normal appearance.  HENT:     Head: Normocephalic and atraumatic.  Eyes:     Extraocular Movements: Extraocular movements intact.  Conjunctiva/sclera: Conjunctivae normal.  Pulmonary:     Effort: Pulmonary effort is normal.  Musculoskeletal:        General: Normal range of motion.  Skin:    General: Skin is warm and dry.  Neurological:     General: No focal deficit present.     Mental Status: She is alert.      Metabolic Disorder Labs: No results found for: HGBA1C, MPG No results found for: PROLACTIN Lab Results  Component Value Date   CHOL 250 (H) 01/29/2016   TRIG 87.0 01/29/2016   HDL 65.40 01/29/2016   CHOLHDL 4 01/29/2016   VLDL 17.4 01/29/2016   LDLCALC 167 (H) 01/29/2016   Lab Results  Component Value Date   TSH 1.36 06/30/2015   TSH 4.132 08/24/2011    Therapeutic Level Labs: No results found for: LITHIUM Lab Results  Component Value Date   VALPROATE 32.4 (L) 10/14/2009   VALPROATE 68.2 10/19/2008   Lab Results  Component Value Date   CBMZ 8.7 10/14/2015    Screenings:  AIMS    Flowsheet Row [facility]  AIMS Total Score 0   AUDIT    Flowsheet Row [facility]  Alcohol Use Disorder Identification Test Final Score (AUDIT) 0   GAD-7    Flowsheet Row [facility]or from 03/03/2023 in [facility]  Total GAD-7 Score 17 20   PHQ2-9    Flowsheet Row [facility]24[facility]20[facility] Counselor from 03/03/2023 in [facility] from 12/09/2022 in [facility] Office Visit from 02/12/2016 in [facility] at [facility] Visit from 06/30/2015 in Primary Care at [facility]  PHQ-2 Total Score 6 6 6  0 2  PHQ-9 Total Score 22 21 20  -- 19   Flowsheet Row ED from 12/09/2023 in [facility] at [facility] Counselor from 03/03/2023 in [facility] ED from 01/14/2023 in [place] [facility] at [facility]  C-SSRS RISK CATEGORY No Risk Low Risk No Risk    Collaboration of Care:   Patient/Guardian was advised Release of Information must be obtained prior to any record release in order to collaborate their care with an outside provider. Patient/Guardian was advised if they have not already done so to contact the registration department to sign all necessary forms in order for us  to release information regarding their care.   Consent: Patient/Guardian gives verbal consent for treatment and assignment of benefits for services provided during this visit. Patient/Guardian expressed understanding and agreed to proceed.    Marlo Masson, MD 01/24/2024, 4:32 PM

## 2024-01-25 ENCOUNTER — Telehealth (HOSPITAL_COMMUNITY): Payer: PRIVATE HEALTH INSURANCE | Admitting: Student in an Organized Health Care Education/Training Program

## 2024-01-25 DIAGNOSIS — G4709 Other insomnia: Secondary | ICD-10-CM

## 2024-01-25 DIAGNOSIS — F3130 Bipolar disorder, current episode depressed, mild or moderate severity, unspecified: Secondary | ICD-10-CM | POA: Diagnosis not present

## 2024-01-25 DIAGNOSIS — F4381 Prolonged grief disorder: Secondary | ICD-10-CM | POA: Diagnosis not present

## 2024-01-25 DIAGNOSIS — F411 Generalized anxiety disorder: Secondary | ICD-10-CM | POA: Diagnosis not present

## 2024-01-25 DIAGNOSIS — R4589 Other symptoms and signs involving emotional state: Secondary | ICD-10-CM

## 2024-01-25 MED ORDER — ARIPIPRAZOLE 5 MG PO TABS: 5.0000 mg | ORAL_TABLET | Freq: Every day | ORAL | 0 refills | Status: DC

## 2024-01-25 MED ORDER — CARBAMAZEPINE ER 400 MG PO TB12: 400.0000 mg | ORAL_TABLET | Freq: Two times a day (BID) | ORAL | 0 refills | Status: DC

## 2024-01-25 MED ORDER — PRAZOSIN HCL 1 MG PO CAPS: 3.0000 mg | ORAL_CAPSULE | Freq: Every day | ORAL | 0 refills | Status: DC

## 2024-01-25 MED ORDER — SERTRALINE HCL 100 MG PO TABS: 200.0000 mg | ORAL_TABLET | Freq: Every day | ORAL | 0 refills | Status: DC

## 2024-01-25 MED ORDER — LORAZEPAM 0.5 MG PO TABS: 0.5000 mg | ORAL_TABLET | Freq: Every day | ORAL | 0 refills | Status: DC | PRN

## 2024-02-04 ENCOUNTER — Other Ambulatory Visit (HOSPITAL_COMMUNITY): Payer: Self-pay | Admitting: Student in an Organized Health Care Education/Training Program

## 2024-02-04 DIAGNOSIS — F411 Generalized anxiety disorder: Secondary | ICD-10-CM

## 2024-02-04 DIAGNOSIS — G4709 Other insomnia: Secondary | ICD-10-CM

## 2024-02-07 ENCOUNTER — Ambulatory Visit (INDEPENDENT_AMBULATORY_CARE_PROVIDER_SITE_OTHER): Payer: PRIVATE HEALTH INSURANCE | Admitting: Clinical

## 2024-02-07 ENCOUNTER — Encounter (HOSPITAL_COMMUNITY): Payer: Self-pay | Admitting: Clinical

## 2024-02-07 DIAGNOSIS — F3130 Bipolar disorder, current episode depressed, mild or moderate severity, unspecified: Secondary | ICD-10-CM | POA: Diagnosis not present

## 2024-02-07 DIAGNOSIS — F4381 Prolonged grief disorder: Secondary | ICD-10-CM | POA: Diagnosis not present

## 2024-02-07 DIAGNOSIS — F411 Generalized anxiety disorder: Secondary | ICD-10-CM | POA: Diagnosis not present

## 2024-02-07 NOTE — Progress Notes (Unsigned)
 THERAPIST PROGRESS NOTE  Session Time: 2:05pm-2:59pm  Session #12  Virtual Visit via Video Note  I connected with Stacey Weaver on 02/07/24 at  2:00 PM EST by a video enabled telemedicine application and verified that I am speaking with the correct person using two identifiers.  Location: Patient: home Provider: home office   I discussed the limitations of evaluation and management by telemedicine and the availability of in person appointments. The patient expressed understanding and agreed to proceed.   I discussed the assessment and treatment plan with the patient. The patient was provided an opportunity to ask questions and all were answered. The patient agreed with the plan and demonstrated an understanding of the instructions.   The patient was advised to call back or seek an in-person evaluation if the symptoms worsen or if the condition fails to improve as anticipated.  I provided 54 minutes of non-face-to-face time during this encounter.  Stacey JINNY Crest, LCSW   Participation Level: Active  Behavioral Response: Casual Alert Depressed, Hopeless, and Irritable   Type of Therapy: Individual Therapy  Treatment Goals addressed:  STG: Identify and decrease cognitive distortions contributing negatively to mood and behavior by identifying 5-7 cognitive distortions patient has and learning how to come up with replacement thoughts that are more balanced, realistic, and helpful.  LTG: Work to arts development officer from models like CBT, Stages of Change, DBT, shame resilience theory, ACT, SFBT, MI, trauma-informed therapy and others to be able to manage mental health symptoms, AEB practicing out of session and reporting back.   STG: Patient will work on forgiveness, shame, sleep, relationship to food, or other issues as appropriate and as such issues present during sessions    LTG: Learn and practice communication techniques such as I statements, open-ended questions,  reflective listening, assertiveness, fair fighting rules, initiating conversations, and more as necessary and taught in session   LTG: Score less than 5 on the GAD-7 as evidenced by intermittent administration of the questionnaire to determine progress in management of anxiety.   STG: Stacey Weaver will practice problem solving skills 3 times per week for the next 4 weeks.  LTG: Learn about the feeling of anxiety and its many variations, the cycle of anxiety and how to interrupt that cycle.    STG: Learn breathing techniques and grounding techniques at an age-appropriate level and demonstrate mastery in session then report independent use of these skills out of session.   STG: Learn about boundary types, how to implement them, and how to enforce them so that patient feels more empowered and content with being able to maintain more helpful, appropriate boundaries in the future for a more balanced result.   LTG: Recall traumatic events without becoming overwhelmed with negative emotions STG: Stacey Weaver will practice emotion regulation skills, distress tolerance skills, mindfulness skills, and interpersonal effectiveness skills weekly for the next 26 week(s)  STG: Report a decrease in PTSD symptoms as evidenced by a 25% reduction in overall score on a clinician administered PTSD assessment screen/scale  LTG: Increase coping skills to manage depression/anxiety/anger and improve ability to perform daily activities as evidenced by fewer depressive episodes and/or anger outbursts and/or anxious days per self-report and collateral information.  STG: Process life events to the extent needed so that will be able to move forward with various areas of life in a better frame of mind per self-report.  ProgressTowards Goals: Progressing  Interventions: DBT and Supportive   Summary: Stacey Weaver is a 48 y.o. female who presents  with prolonged grief and Bipolar 1 disorder. She presented oriented x5 and stated she was  feeling horrible, life is horrible, I feel worthless and hopeless and I am tired of living like this.  I called my brother last night in a bad anxiety attack.  CSW evaluated patient's medication compliance, use of coping tools, and self-care, as applicable.  She feels that she needs more than 5 anxiety pills per month, stating that she does not abuse them and only uses them when absolutely necessary, was referred to talk to her doctor at next appointment or to call in and ask if she could go ahead and get a refill, but to go through medication provider.  She provided an update on various aspects of her life that are normally discussed in therapy, including her ongoing grief from loss of mother and grandmother, exacerbated on Saturday when her aunt insisted on her coming to her house for dinner, which meant driving by her mother's former house, not only the place where she lived with mother for a long time, but also the place where mother died.  She disclosed that she is having a lot of dark thoughts, I don't want to be here anymore, I want to die.  She stated she will not follow through on any suicidal ideation because she would not want to leave her brother to handle this grief on his own and she would not see her mother again in heaven if she committed suicide.  CSW ascertained that she does not have any guns in the home and even though she does have sharps in the home, she would never do anything gruesome or painful.  She actually stated if she was going to kill herself it would be by overdose, but again reiterated that is not a plan and she is not going to do it.  CSW spoke with her about possibly needing to go to [facility] for an evaluation and she refused adamantly, once again saying she will not go to any hospital since her mother is no longer around to help her through it.  She said, I am not going to hurt me, there is nothing here to hurt me.  She did admit that this time of year is always horrible for  her because of her losses, would like to just skip the holidays but of course that is impossible.  CSW ascertained that she continues to do gratitude journaling daily, but that she has not followed through on Behavioral Activation as taught in session, so this was reiterated.  She also disclosed that she has not been showering as often as usual and using the concept of Behavioral Activation, this was encouraged.  She stated she has been listening to some audiobooks on grief and this has been helpful, so CSW encouraged her to keep doing this.  CSW also asked her to consider taking an alternative route to her aunt's house that would not take her by her mother's house, which she had not previously considered and thought would be very possible and helpful.  Finally. CSW then taught TIPP skills to rapidly deescalate from emotional mind. This included: T - change temperature rapidly with cold water on the face or neck in order to lower heart rate and deescalate emotions quickly while engaging the parasympathetic nervous system to relax the body I - intense exercise to reduce the fight or flight reaction, increase heart rate in order to trick body into having less anxiety when the heart rate starts to lower  at exercise cessation, and release endorphins to decrease negativity P - paced breathing such as 4-7-8 or boxed breathing to gain control over mind by focusing on something controllable, engage the parasympathetic nervous system, and relax the body P - paired muscle relaxation by squeezing muscle groups for 5-10 seconds then releasing them while engaging in the breathing exercises in order to release the tension in the body  The patient was receptive and took notes..   Suicidal/Homicidal: No without intent/plan  Therapist Response:  Patient is progressing AEB engaging in scheduled therapy session.  Throughout the session, CSW gave patient the opportunity to explore thoughts and feelings associated with current  life situations and past/present stressors.   CSW challenged patient gently and appropriately to consider different ways of looking at reported issues. CSW encouraged patient's expression of feelings and validated these using empathy, active listening, open body language, and unconditional positive regard.   Plan/Recommendations:  Return to therapy in 2 weeks to next scheduled appointment on 11/25, reflect on what was discussed in session, engage in self care behaviors as explored in session, do homework as assigned (TIPP skills), and return to next session prepared to talk about experience with new coping methods.   Diagnosis:  Prolonged grief disorder  Generalized anxiety disorder  Bipolar I disorder, most recent episode depressed (HCC)  Collaboration of Care: Psychiatrist AEB -psychiatrist can read therapy notes; therapist can and does read psychiatric notes prior to sessions   Patient/Guardian was advised Release of Information must be obtained prior to any record release in order to collaborate their care with an outside provider. Patient/Guardian was advised if they have not already done so to contact the registration department to sign all necessary forms in order for us  to release information regarding their care.   Consent: Patient/Guardian gives verbal consent for treatment and assignment of benefits for services provided during this visit. Patient/Guardian expressed understanding and agreed to proceed.   Stacey JINNY Crest, LCSW 02/07/2024

## 2024-02-14 ENCOUNTER — Telehealth (HOSPITAL_COMMUNITY): Payer: Self-pay | Admitting: *Deleted

## 2024-02-14 DIAGNOSIS — G4709 Other insomnia: Secondary | ICD-10-CM

## 2024-02-14 DIAGNOSIS — F411 Generalized anxiety disorder: Secondary | ICD-10-CM

## 2024-02-14 MED ORDER — LORAZEPAM 0.5 MG PO TABS
0.5000 mg | ORAL_TABLET | Freq: Every day | ORAL | 0 refills | Status: DC | PRN
Start: 2024-02-14 — End: 2024-02-15

## 2024-02-14 NOTE — Telephone Encounter (Signed)
 Pt requests refill of the Ativan  0.5 mg. Please send to [facility], [address], [place]. Pharmacy has been added.    Last visit: 01/25/24 Next visit: 03/14/24

## 2024-02-14 NOTE — Telephone Encounter (Signed)
 PDMP and dispense hx reviewed. Refill submitted.  Laken Rog Carrin Carrero, MD PGY-3, [facility]

## 2024-02-15 ENCOUNTER — Other Ambulatory Visit (HOSPITAL_COMMUNITY): Payer: Self-pay | Admitting: Psychiatry

## 2024-02-15 ENCOUNTER — Telehealth (HOSPITAL_COMMUNITY): Payer: Self-pay | Admitting: Student in an Organized Health Care Education/Training Program

## 2024-02-15 DIAGNOSIS — F4381 Prolonged grief disorder: Secondary | ICD-10-CM

## 2024-02-15 DIAGNOSIS — F411 Generalized anxiety disorder: Secondary | ICD-10-CM

## 2024-02-15 MED ORDER — CLONAZEPAM 0.5 MG PO TABS: 0.5000 mg | ORAL_TABLET | Freq: Every day | ORAL | 0 refills | Status: DC | PRN

## 2024-02-15 NOTE — Telephone Encounter (Addendum)
 Contacted and spoke with patient at her mobile number.  She reports she has not been doing well since last seen, she reports symptoms of depression, crying spells, and anxiety have been worsening due to the holidays approaching.  She describes the holiday season is being a particularly difficult time as it reminds her of her mother's passing.  She reports having intermittent thoughts of self-harm and not wanting to be alive but denies any intent or plan.  Patient describes feeling tempted to self isolate, but acknowledges this worsens her symptoms.  She reports she has been making an effort to spend time with her brother.  She is also amenable to increasing the frequency of her therapy visits to once weekly.  I also encouraged the patient to consider engaging in trauma focused therapy.  She declines offer for PHP/IOP referral.  Regarding the patient's symptoms of anxiety, she reports that she has used all 5 tablets of her Ativan  at this point.  She also admits to getting an additional 2 tablets from a friend, we will have to closely monitor in the future for misuse.  She was amenable to changing her Ativan  to Klonopin  due to its longer half-life.  At patient's follow-up appointment on 03/14/2024, will review titration/optimization of patient's other psychotropic medications if symptoms continue.  Plan as detailed below: - Discontinue Ativan , contacted patient's pharmacy to ensure filling was stopped - Start Klonopin  0.5 mg daily as needed for acute anxiety/panic attacks, 5 tablets, 0 refills (per PDMP, this will be dispensed on 02/24/2024)  -- Next refill due on 03/25/2024  Stacey Bublitz Carrin Carrero, MD PGY-3, [facility]

## 2024-02-17 ENCOUNTER — Telehealth (HOSPITAL_COMMUNITY): Payer: Self-pay | Admitting: Psychiatry

## 2024-02-17 NOTE — Telephone Encounter (Signed)
 D:  Dr. Marlo Etienne referred pt to MH-IOP/PHP.  A:  Placed call to discuss groups with pt but there was no answer.  Left vm encouraging pt to call if she's interested in group.  Informed pt that PHP probably could get her in sooner than MH-IOP if she's interested in group.  CM left her phone #.  Inform Dr. Carrero.

## 2024-02-21 ENCOUNTER — Other Ambulatory Visit (HOSPITAL_COMMUNITY): Payer: Self-pay | Admitting: Student in an Organized Health Care Education/Training Program

## 2024-02-21 DIAGNOSIS — G4709 Other insomnia: Secondary | ICD-10-CM

## 2024-02-22 ENCOUNTER — Ambulatory Visit (INDEPENDENT_AMBULATORY_CARE_PROVIDER_SITE_OTHER): Payer: PRIVATE HEALTH INSURANCE | Admitting: Clinical

## 2024-02-22 DIAGNOSIS — F3130 Bipolar disorder, current episode depressed, mild or moderate severity, unspecified: Secondary | ICD-10-CM

## 2024-02-22 DIAGNOSIS — F411 Generalized anxiety disorder: Secondary | ICD-10-CM | POA: Diagnosis not present

## 2024-02-22 DIAGNOSIS — F4381 Prolonged grief disorder: Secondary | ICD-10-CM | POA: Diagnosis not present

## 2024-02-26 ENCOUNTER — Encounter (HOSPITAL_COMMUNITY): Payer: Self-pay | Admitting: Clinical
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
## 2024-02-26 NOTE — Addendum Note (Signed)
 Addended by: CASS ELGIE PARAS on: 02/26/2024 09:08 AM   Modules accepted: Level of Service

## 2024-03-08 ENCOUNTER — Telehealth (HOSPITAL_COMMUNITY): Payer: Self-pay

## 2024-03-08 DIAGNOSIS — F411 Generalized anxiety disorder: Secondary | ICD-10-CM

## 2024-03-08 DIAGNOSIS — F4381 Prolonged grief disorder: Secondary | ICD-10-CM

## 2024-03-08 NOTE — Telephone Encounter (Signed)
 Patient is calling fro a refill on Klonopin , last filled on 11/28. Please review and advise, thank you

## 2024-03-09 ENCOUNTER — Ambulatory Visit (INDEPENDENT_AMBULATORY_CARE_PROVIDER_SITE_OTHER): Payer: PRIVATE HEALTH INSURANCE | Admitting: Clinical

## 2024-03-09 ENCOUNTER — Encounter (HOSPITAL_COMMUNITY): Payer: Self-pay | Admitting: Clinical

## 2024-03-09 DIAGNOSIS — F411 Generalized anxiety disorder: Secondary | ICD-10-CM | POA: Diagnosis not present

## 2024-03-09 DIAGNOSIS — F4381 Prolonged grief disorder: Secondary | ICD-10-CM | POA: Diagnosis not present

## 2024-03-09 DIAGNOSIS — F3130 Bipolar disorder, current episode depressed, mild or moderate severity, unspecified: Secondary | ICD-10-CM | POA: Diagnosis not present

## 2024-03-09 MED ORDER — CLONAZEPAM 0.5 MG PO TABS: 0.5000 mg | ORAL_TABLET | Freq: Every day | ORAL | 0 refills | Status: DC | PRN

## 2024-03-09 NOTE — Telephone Encounter (Signed)
 PDMP reviewed, shows appropriate filling of patient's Klonopin .  Prescription sent today: - Klonopin  0.5 mg, 5 tablets, 0 refills, to be prescribed monthly
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
## 2024-03-14 ENCOUNTER — Telehealth (HOSPITAL_COMMUNITY): Payer: PRIVATE HEALTH INSURANCE | Admitting: Student in an Organized Health Care Education/Training Program

## 2024-03-14 DIAGNOSIS — F4381 Prolonged grief disorder: Secondary | ICD-10-CM

## 2024-03-14 DIAGNOSIS — G4709 Other insomnia: Secondary | ICD-10-CM

## 2024-03-14 DIAGNOSIS — F411 Generalized anxiety disorder: Secondary | ICD-10-CM | POA: Diagnosis not present

## 2024-03-14 DIAGNOSIS — F3130 Bipolar disorder, current episode depressed, mild or moderate severity, unspecified: Secondary | ICD-10-CM | POA: Diagnosis not present

## 2024-03-14 DIAGNOSIS — R4589 Other symptoms and signs involving emotional state: Secondary | ICD-10-CM | POA: Diagnosis not present

## 2024-03-14 MED ORDER — PRAZOSIN HCL 1 MG PO CAPS: 3.0000 mg | ORAL_CAPSULE | Freq: Every day | ORAL | 0 refills | Status: DC

## 2024-03-14 MED ORDER — CLONAZEPAM 0.5 MG PO TABS: 0.5000 mg | ORAL_TABLET | Freq: Every day | ORAL | 0 refills | Status: DC | PRN

## 2024-03-14 MED ORDER — ARIPIPRAZOLE 5 MG PO TABS: 5.0000 mg | ORAL_TABLET | Freq: Every day | ORAL | 0 refills | Status: DC

## 2024-03-14 MED ORDER — CARBAMAZEPINE ER 400 MG PO TB12: 400.0000 mg | ORAL_TABLET | Freq: Two times a day (BID) | ORAL | 0 refills | Status: DC

## 2024-03-14 MED ORDER — SERTRALINE HCL 100 MG PO TABS: 200.0000 mg | ORAL_TABLET | Freq: Every day | ORAL | 0 refills | Status: AC

## 2024-03-14 NOTE — Addendum Note (Signed)
 Addended by: CARRION CARRERO, MARLO on: 03/14/2024 11:55 AM   Modules accepted: Level of Service

## 2024-03-14 NOTE — Progress Notes (Signed)
 [facility] MD Outpatient Progress Note  03/14/2024 11:02 AM Stacey Weaver  MRN:  [id number]   Virtual Visit via Telephone Note  I connected with Stacey Weaver on 03/14/2024 at 11:00 AM EST by a video enabled telemedicine application and verified that I am speaking with the correct person using two identifiers.  Location: Patient: Home  Provider: Office    I discussed the limitations, risks, security and privacy concerns of performing an evaluation and management service by telephone and the availability of in person appointments. I also discussed with the patient that there may be a patient responsible charge related to this service. The patient expressed understanding and agreed to proceed.   I discussed the assessment and treatment plan with the patient. The patient was provided an opportunity to ask questions and all were answered. The patient agreed with the plan and demonstrated an understanding of the instructions.   The patient was advised to call back or seek an in-person evaluation if the symptoms worsen or if the condition fails to improve as anticipated.   Marlo Masson, MD Psych Resident, PGY-3    Assessment:  Stacey Weaver presents for follow-up evaluation on 03/14/2024 .  At todays follow-up, the patient reports overall improvement in mood since the anniversary of her mothers death has passed, noting a sense of relief and reflecting that she navigated this year with greater resilience compared to prior years. she continues to experience chronic anxiety and sleep disturbance, which appear largely unchanged since the last visit. At this time, her current medication regimen is appropriate to continue at the current dose. The focus of todays visit appropriately emphasized skills-based and behavioral strategies. Psychoeducation was provided regarding the role of regular cardiovascular exercise in reducing sympathetic nervous system activation and improving  baseline anxiety and sleep regulation.  Identifying Information: Stacey Weaver is a 48 y.o. female with a history of Bipolar 1 disorder, insomnia, GAD, prolonged grief disorder who is an established patient with [facility] for management of mood and insomnia. She is a former patient of Dr. Rainelle.  For a comprehensive history and detailed assessment, please refer to the initial adult assessment.  Sees Grossman-Orr, Elgie PARAS, LCSW for therapy   Plan:  # Bipolar 1 Disorder #GAD Past medication trials: Depakote, Abilify , Lamictal , Prozac , Cymbalta, Ativan  Status of problem: Depression is chronic, stable Interventions: --Continue Tegretol  400 mg BID for mood stabilization --Continue Zoloft  200 mg daily to better target depression and anxiety --Continue abilify  5 mg for improved mood stability -- Continue klonopin  0.5 mg daily PRN, 5 tablets monthly  -- Patient was changed from Ativan  to Klonopin  in between visits , see telephone encounter on 02/15/2024     # Prolonged grief disorder #Other insomnia #R/o PTSD Past medication trials: ambien  stopped due to futility; seroquel, abilify , geodon (worsened hallucinations), zyprexa (stopped for hallucinations), ambien , valium , gabapentin (patient reported bad reaction to this),  Status of problem: Chronic, stable Interventions: -- Continue prazosin  3 mg nightly for nightmares -- Discontinue Remeron  , ineffective -- Continue therapy with [facility] maintenance, PCP: Douglass Gerard Dross, PA-C  Weight loss --phentermine 50 mg daily, Wegovy  Patient was given contact information for behavioral health clinic and was instructed to call 911 for emergencies.   Subjective:  Chief Complaint:  No chief complaint on file.   Interval History:  The patient reports that she recently marked the anniversary of her mothers death on the 03-20-2024 and states that she feels lighter now that  the date has passed. She  describes recalling the day of her mothers passing on the anniversary and believes that she handled this year better than last year. She reports that her mood feels ok overall and states that her depressive symptoms may be improving. She describes ongoing anxiety with some bad days. She reports chronic sleep difficulties that are unchanged. She states that she has not experienced adverse effects from her psychiatric medications and reports medication compliance as prescribed, though she describes having migraines and states she is discussing this with neurology. She reports no recent substance use, denying alcohol, tobacco, cannabis, or other substances. She reports no suicidal ideation, no homicidal ideation, and denies auditory or visual hallucinations.  Visit Diagnosis:  No diagnosis found.    Past Psychiatric History:  Diagnoses: Bipolar 1 disorder, Medication trials: Haldol, Abilify  (previously discontinued due to cost), Klonopin , Lamictal , Wellbutrin , Prozac , Cymbalta (experienced muscle spasms and suicidal thoughts), Geodon and Zyprexa (experienced hallucinations), Navane , Depakote  Previous psychiatrist/therapist: [facility] with Dr. Donn as her outpatient psychiatrist Hospitalizations: [facility] in 2009, multiple times in 2010, 2012, 2017 Suicide attempts: 2012-overdose of Wellbutrin  150 mg; overdose in 2013 on Wellbutrin  and Benadryl  SIB: Denies Hx of violence towards others: Denies Current access to guns: Denies Hx of trauma/abuse: Denies    Social History   Socioeconomic History   Marital status: Single    Spouse name: Not on file   Number of children: Not on file   Years of education: Not on file   Highest education level: Not on file  Occupational History   Not on file  Tobacco Use   Smoking status: Never   Smokeless tobacco: Never  Substance and Sexual Activity   Alcohol use: No   Drug use: No   Sexual  activity: Yes    Birth control/protection: I.U.D.  Other Topics Concern   Not on file  Social History Narrative   Not on file   Social Drivers of Health   Tobacco Use: Low Risk (03/09/2024)   Patient History    Smoking Tobacco Use: Never    Smokeless Tobacco Use: Never    Passive Exposure: Not on file  Financial Resource Strain: Not on file  Food Insecurity: Low Risk (08/10/2023)   Received from [facility]   Epic    Within the past 12 months, you worried that your food would run out before you got money to buy more: Never true    Within the past 12 months, the food you bought just didn't last and you didn't have money to get more: Not on file  Recent Concern: Food Insecurity - Medium Risk (08/10/2023)   Received from [facility]   Epic    Within the past 12 months, you worried that your food would run out before you got money to buy more: Never true    Within the past 12 months, the food you bought just didn't last and you didn't have money to get more. : Sometimes true  Transportation Needs: Not on file (11/04/2022)  Physical Activity: Not on file  Stress: Not on file  Social Connections: Unknown (08/04/2021)   [facility]   Social Network    Social Network: Not on file  Depression (PHQ2-9): High Risk (10/24/2023)   Depression (PHQ2-9)    PHQ-2 Score: 22  Alcohol Screen: Not on file  Housing: Low Risk (08/10/2023)   Received from [facility]   Epic    What  is your living situation today?: I have a steady place to live    Think about the place you live. Do you have problems with any of the following? Choose all that apply:: Not on file  Utilities: Low Risk (11/04/2022)   Received from [facility]   Utilities    In the past 12 months has the electric, gas, oil, or water company threatened to shut off services in your home? : No  Health Literacy: Not on file    Allergies:  Allergies  Allergen Reactions   Almotriptan Malate Shortness Of Breath and Other  (See Comments)    Muscle spasms   Cymbalta [Duloxetine Hcl] Other (See Comments)    Other reaction(s): Other Muscle spasms suicidal thoughts   Imitrex [Sumatriptan] Shortness Of Breath, Other (See Comments) and Palpitations    Muscle spasms   Prednisone Other (See Comments) and Palpitations   Zomig Shortness Of Breath   Geodon [Ziprasidone Hcl] Other (See Comments)    hallucinations   Lactose Intolerance (Gi) Diarrhea and Other (See Comments)    Gas and bloated    Penicillins Other (See Comments)    GI upset  Has patient had a PCN reaction causing immediate rash, facial/tongue/throat swelling, SOB or lightheadedness with hypotension: No Has patient had a PCN reaction causing severe rash involving mucus membranes or skin necrosis: No Has patient had a PCN reaction that required hospitalization No Has patient had a PCN reaction occurring within the last 10 years: Yes If all of the above answers are NO, then may proceed with Cephalosporin use.    Zyprexa [Olanzapine] Other (See Comments)    Hallucinations     Current Medications: Current Outpatient Medications  Medication Sig Dispense Refill   ARIPiprazole  (ABILIFY ) 5 MG tablet Take 1 tablet (5 mg total) by mouth daily. 90 tablet 0   atorvastatin (LIPITOR) 10 MG tablet Take 10 mg by mouth daily.     carbamazepine  (TEGRETOL  XR) 400 MG 12 hr tablet Take 1 tablet (400 mg total) by mouth 2 (two) times daily. 180 tablet 0   clonazePAM  (KLONOPIN ) 0.5 MG tablet Take 1 tablet (0.5 mg total) by mouth daily as needed for anxiety. 5 tablet 0   Erenumab-aooe (AIMOVIG) 70 MG/ML SOAJ Inject 70 mg/mL as directed every 30 (thirty) days.     metoprolol succinate (TOPROL-XL) 25 MG 24 hr tablet Take 1 tablet by mouth daily.     oxyCODONE  (ROXICODONE ) 5 MG immediate release tablet Take 0.5 tablets (2.5 mg total) by mouth every 4 (four) hours as needed for severe pain (pain score 7-10). 12 tablet 0   phentermine 15 MG capsule Take 15 mg by mouth  daily.     potassium chloride  (KLOR-CON ) 10 MEQ tablet Take 10 mEq by mouth daily.     prazosin  (MINIPRESS ) 1 MG capsule Take 3 capsules (3 mg total) by mouth at bedtime. 180 capsule 0   sertraline  (ZOLOFT ) 100 MG tablet Take 2 tablets (200 mg total) by mouth daily. 180 tablet 0   UBRELVY 100 MG TABS Take by mouth.     WEGOVY 0.5 MG/0.5ML SOAJ Inject 0.5 mg into the skin once a week.     No current facility-administered medications for this visit.    ROS: Review of Systems  All other systems reviewed and are negative.   Objective:  Objective: Psychiatric Specialty Exam: General Appearance: Casual, fairly groomed  Eye Contact:  Good    Speech:  Clear, coherent, normal rate, spontaneous  Volume:  Normal  Mood: Better  Affect:c congruent, full range  Thought Content: Logical  Suicidal Thoughts: see subjective  Thought Process:  Coherent, linear  Orientation:  A&Ox4   Memory:  Immediate good  Judgment:  Fair   Insight:  Fair  Concentration:  Attention and concentration good   Recall:  Good  Fund of Knowledge: Good  Language: Good, fluent  Psychomotor Activity: Normal  Akathisia:  NA   AIMS (if indicated): NA   Assets:   Communication Skills Desire for Improvement Resilience Social Support  ADL's:  Intact  Cognition: WNL  Sleep: see above  Appetite: see above    Physical Exam Vitals reviewed.  Constitutional:      Appearance: Normal appearance.  HENT:     Head: Normocephalic and atraumatic.  Eyes:     Extraocular Movements: Extraocular movements intact.     Conjunctiva/sclera: Conjunctivae normal.  Pulmonary:     Effort: Pulmonary effort is normal.  Musculoskeletal:        General: Normal range of motion.  Skin:    General: Skin is warm and dry.  Neurological:     General: No focal deficit present.     Mental Status: She is alert.      Metabolic Disorder Labs: No results found for: HGBA1C, MPG No results found for: PROLACTIN Lab Results   Component Value Date   CHOL 250 (H) 01/29/2016   TRIG 87.0 01/29/2016   HDL 65.40 01/29/2016   CHOLHDL 4 01/29/2016   VLDL 17.4 01/29/2016   LDLCALC 167 (H) 01/29/2016   Lab Results  Component Value Date   TSH 1.36 06/30/2015   TSH 4.132 08/24/2011    Therapeutic Level Labs: No results found for: LITHIUM Lab Results  Component Value Date   VALPROATE 32.4 (L) 10/14/2009   VALPROATE 68.2 10/19/2008   Lab Results  Component Value Date   CBMZ 8.7 10/14/2015    Screenings:  AIMS    Flowsheet Row [facility]  AIMS Total Score 0   AUDIT    Flowsheet Row [facility]  Alcohol Use Disorder Identification Test Final Score (AUDIT) 0   GAD-7    Flowsheet Row [facility]or from 03/03/2023 in [facility]  Total GAD-7 Score 17 20   PHQ2-9    Flowsheet Row Office Visit [facility]24[facility]20[facility] Counselor from 03/03/2023 in [facility] from 12/09/2022 in [facility] Office Visit from 02/12/2016 in [facility] at [facility] Visit from 06/30/2015 in [facility] at [place]  PHQ-2 Total Score 6 6 6  0 2  PHQ-9 Total Score 22 21 20  -- 19   Flowsheet Row ED from 12/09/2023 in [facility] at [facility] Counselor from 03/03/2023 in [facility] ED from 01/14/2023 in [facility]  C-SSRS RISK CATEGORY No Risk Low Risk No Risk    Collaboration of Care:   Patient/Guardian was advised Release of Information must be obtained prior to any record release in order to collaborate  their care with an outside provider. Patient/Guardian was advised if they have not already done so to contact the registration department to sign all necessary forms in order for us  to release information regarding their care.  Consent: Patient/Guardian gives verbal consent for treatment and assignment of benefits for services provided during this visit. Patient/Guardian expressed understanding and agreed to proceed.    Marlo Masson, MD 03/14/2024, 11:02 AM

## 2024-03-20 ENCOUNTER — Ambulatory Visit (INDEPENDENT_AMBULATORY_CARE_PROVIDER_SITE_OTHER): Payer: PRIVATE HEALTH INSURANCE | Admitting: Clinical

## 2024-03-20 DIAGNOSIS — F4381 Prolonged grief disorder: Secondary | ICD-10-CM

## 2024-03-20 DIAGNOSIS — F411 Generalized anxiety disorder: Secondary | ICD-10-CM

## 2024-03-20 DIAGNOSIS — F3130 Bipolar disorder, current episode depressed, mild or moderate severity, unspecified: Secondary | ICD-10-CM

## 2024-03-20 NOTE — Progress Notes (Signed)
 THERAPIST PROGRESS NOTE  Session Time: 11:03am-12:01pm  Session #15  Virtual Visit via Video Note  I connected with [name] on 03/20/2024 at 11:00 AM EST by a video enabled telemedicine application and verified that I am speaking with the correct person using two identifiers.  Location: Patient: home Provider: home office   I discussed the limitations of evaluation and management by telemedicine and the availability of in person appointments. The patient expressed understanding and agreed to proceed.   I discussed the assessment and treatment plan with the patient. The patient was provided an opportunity to ask questions and all were answered. The patient agreed with the plan and demonstrated an understanding of the instructions.   The patient was advised to call back or seek an in-person evaluation if the symptoms worsen or if the condition fails to improve as anticipated.  I provided 58 minutes of non-face-to-face time during this encounter.  [name], LCSW   Participation Level: Active  Behavioral Response: Casual Alert Euthymic   Type of Therapy: Individual Therapy  Treatment Goals addressed:  STG: Identify and decrease cognitive distortions contributing negatively to mood and behavior by identifying 5-7 cognitive distortions patient has and learning how to come up with replacement thoughts that are more balanced, realistic, and helpful.  LTG: Work to arts development officer from models like CBT, Stages of Change, DBT, shame resilience theory, ACT, SFBT, MI, trauma-informed therapy and others to be able to manage mental health symptoms, AEB practicing out of session and reporting back.   STG: Patient will work on forgiveness, shame, sleep, relationship to food, or other issues as appropriate and as such issues present during sessions    LTG: Learn and practice communication techniques such as I statements, open-ended questions, reflective listening,  assertiveness, fair fighting rules, initiating conversations, and more as necessary and taught in session   LTG: Score less than 5 on the GAD-7 as evidenced by intermittent administration of the questionnaire to determine progress in management of anxiety.   STG: Anaaya will practice problem solving skills 3 times per week for the next 4 weeks.  LTG: Learn about the feeling of anxiety and its many variations, the cycle of anxiety and how to interrupt that cycle.    STG: Learn breathing techniques and grounding techniques at an age-appropriate level and demonstrate mastery in session then report independent use of these skills out of session.   STG: Learn about boundary types, how to implement them, and how to enforce them so that patient feels more empowered and content with being able to maintain more helpful, appropriate boundaries in the future for a more balanced result.   LTG: Recall traumatic events without becoming overwhelmed with negative emotions STG: Elanor will practice emotion regulation skills, distress tolerance skills, mindfulness skills, and interpersonal effectiveness skills weekly for the next 26 week(s)  STG: Report a decrease in PTSD symptoms as evidenced by a 25% reduction in overall score on a clinician administered PTSD assessment screen/scale  LTG: Increase coping skills to manage depression/anxiety/anger and improve ability to perform daily activities as evidenced by fewer depressive episodes and/or anger outbursts and/or anxious days per self-report and collateral information.  STG: Process life events to the extent needed so that will be able to move forward with various areas of life in a better frame of mind per self-report.  ProgressTowards Goals: Progressing  Interventions: Supportive and Other: ERP   Summary: Karl Shawan Tosh is a 48 y.o. female who presents with prolonged grief  and Bipolar 1 disorder. She presented oriented x5 and stated she was feeling things have  been so busy at work, it's been crazy.  CSW evaluated patient's medication compliance, use of coping tools, and self-care, as applicable.  She provided an update on various aspects of her life that are normally discussed in therapy, including a fullblown anxiety attack that she experienced in the setting of a new restaurant with her friends, adding that she feels she just became over-stimulated.  CSW pointed out that one things she can do to help herself is to remember that she can use the phrase That's just a thought from my brain to minimize the importance of the information that is flooding her at the time of a panic attack.  We talked about how often she is having such events and CSW asked her about whether she wants to learn how to manage this anxiety or how to actually make it smaller.  CSW reminded her about the video about the anxious brain from last session and that it is the amygdala that sends the message that something is wrong, which doesn't mean it is telling the truth all the time.  She has been very busy and did not watch the first of the series of videos online called Noises in your head so with her permission this was shown to her.  She related to it very strongly and said that it described her anxiety exactly.  We then worked on developing a doctor, general practice of anxiety situations, assigning Subjective Units of Distress Scale (1-10), and identifying at least one safety behavior she uses for each.  CSW then sent this to her via email to continue to work on because all her anxieties were in the upper range and it was pointed out to her that we would not start by working on the larger items, but working on lower ones.  Anxiety Situation     SUDS (1-10)   Subjective Units of Distress Scale  Safety Behavior (how do you make the anxiety go away)   Thoughts of losing brother    10  Call him, ask him about symptoms, bug him about appointments   Thinking about childhood trauma   9 Suppress it,  pretend it did not happen, blame and beat self up for sharing it (with mother)   Losing control over mental health/being hospitalized   8 Isolate, dont tell people what is going on, dishonest about feelings   Thoughts of not finding the right partner  7 Isolate   Money issues (fear will not have enough to sustain myself, will be unemployed)  6 Spend less money on shopping problem, only buy stuff that is needed not wanted, ignore finances, put head in sand   Being around big groups of people  5 Leave the situation   [employer] list      Suicidal/Homicidal: No without intent/plan  Therapist Response:  Patient is progressing AEB engaging in scheduled therapy session.  Throughout the session, CSW gave patient the opportunity to explore thoughts and feelings associated with current life situations and past/present stressors.   CSW challenged patient gently and appropriately to consider different ways of looking at reported issues. CSW encouraged patients expression of feelings and validated these using empathy, active listening, open body language, and unconditional positive regard.   Plan/Recommendations:  Return to therapy in 2 weeks to next scheduled appointment on 1/9, reflect on what was discussed in session, engage in self care behaviors as explored in session, do homework  as assigned (watch Noise In Your Head videos, at least second one, finish hierarchy list), and return to next session prepared to talk about experience with new coping methods.   Diagnosis:  Bipolar I disorder, most recent episode depressed (HCC)  Generalized anxiety disorder  Prolonged grief disorder  Collaboration of Care: Psychiatrist AEB -psychiatrist can read therapy notes; therapist can and does read psychiatric notes prior to sessions   Patient/Guardian was advised Release of Information must be obtained prior to any record release in order to collaborate their care with an outside provider. Patient/Guardian was  advised if they have not already done so to contact the registration department to sign all necessary forms in order for us  to release information regarding their care.   Consent: Patient/Guardian gives verbal consent for treatment and assignment of benefits for services provided during this visit. Patient/Guardian expressed understanding and agreed to proceed.   [name], LCSW 03/20/2024

## 2024-03-24 ENCOUNTER — Encounter (HOSPITAL_COMMUNITY): Payer: Self-pay | Admitting: Clinical

## 2024-03-26 ENCOUNTER — Other Ambulatory Visit (HOSPITAL_COMMUNITY): Payer: Self-pay | Admitting: Student in an Organized Health Care Education/Training Program

## 2024-03-26 DIAGNOSIS — G4709 Other insomnia: Secondary | ICD-10-CM

## 2024-04-02 ENCOUNTER — Telehealth (HOSPITAL_COMMUNITY): Payer: Self-pay

## 2024-04-02 DIAGNOSIS — G4709 Other insomnia: Secondary | ICD-10-CM

## 2024-04-02 NOTE — Telephone Encounter (Signed)
 Contacted and spoke with the patient at her mobile number.  She confirms a request for trazodone  to be refilled, reports that she has been taking this medication since she did not find the Remeron  helpful.  Remeron  was discontinued at the previous visit.  Prescription submitted: - Trazodone  300 mg nightly as needed for insomnia, 90 tablets, 2 refills  Sherril Shipman Carrin Carrero, MD PGY-3, [facility]

## 2024-04-02 NOTE — Telephone Encounter (Signed)
 Patient called me this morning to get a refill on the Trazodone  - I did not see the medication in the med list and asked the patient if she was taken off of it - she became upset and said that she could not take the Remeron  and had gone back to the trazodone . I asked the patient if she had called and advised us  of this. Patient got even angrier and demanded I refill her Trazodone  and stop all my questions. I advised patient I would send a message to the provider, I told her to have a nice day and she said you too bitch. And hung up. I am unable to find confirmation she is still taking the Trazodone , please review and advise, thank you

## 2024-04-06 ENCOUNTER — Ambulatory Visit (INDEPENDENT_AMBULATORY_CARE_PROVIDER_SITE_OTHER): Payer: PRIVATE HEALTH INSURANCE | Admitting: Clinical

## 2024-04-06 DIAGNOSIS — F411 Generalized anxiety disorder: Secondary | ICD-10-CM | POA: Diagnosis not present

## 2024-04-06 DIAGNOSIS — F4381 Prolonged grief disorder: Secondary | ICD-10-CM

## 2024-04-06 DIAGNOSIS — F3130 Bipolar disorder, current episode depressed, mild or moderate severity, unspecified: Secondary | ICD-10-CM | POA: Diagnosis not present

## 2024-04-06 NOTE — Progress Notes (Unsigned)
 THERAPIST PROGRESS NOTE  Session Time: 11:03am-12:01pm  Session #15  Virtual Visit via Video Note  I connected with Stacey Weaver on 04/06/2024 at 11:00 AM EST by a video enabled telemedicine application and verified that I am speaking with the correct person using two identifiers.  Location: Patient: home Provider: home office   I discussed the limitations of evaluation and management by telemedicine and the availability of in person appointments. The patient expressed understanding and agreed to proceed.   I discussed the assessment and treatment plan with the patient. The patient was provided an opportunity to ask questions and all were answered. The patient agreed with the plan and demonstrated an understanding of the instructions.   The patient was advised to call back or seek an in-person evaluation if the symptoms worsen or if the condition fails to improve as anticipated.  I provided 58 minutes of non-face-to-face time during this encounter.  Stacey JINNY Crest, LCSW   Participation Level: Active  Behavioral Response: Casual Alert Euthymic   Type of Therapy: Individual Therapy  Treatment Goals addressed:  STG: Identify and decrease cognitive distortions contributing negatively to mood and behavior by identifying 5-7 cognitive distortions patient has and learning how to come up with replacement thoughts that are more balanced, realistic, and helpful.  LTG: Work to arts development officer from models like CBT, Stages of Change, DBT, shame resilience theory, ACT, SFBT, MI, trauma-informed therapy and others to be able to manage mental health symptoms, AEB practicing out of session and reporting back.   STG: Patient will work on forgiveness, shame, sleep, relationship to food, or other issues as appropriate and as such issues present during sessions    LTG: Learn and practice communication techniques such as I statements, open-ended questions, reflective listening,  assertiveness, fair fighting rules, initiating conversations, and more as necessary and taught in session   LTG: Score less than 5 on the GAD-7 as evidenced by intermittent administration of the questionnaire to determine progress in management of anxiety.   STG: Stacey Weaver will practice problem solving skills 3 times per week for the next 4 weeks.  LTG: Learn about the feeling of anxiety and its many variations, the cycle of anxiety and how to interrupt that cycle.    STG: Learn breathing techniques and grounding techniques at an age-appropriate level and demonstrate mastery in session then report independent use of these skills out of session.   STG: Learn about boundary types, how to implement them, and how to enforce them so that patient feels more empowered and content with being able to maintain more helpful, appropriate boundaries in the future for a more balanced result.   LTG: Recall traumatic events without becoming overwhelmed with negative emotions STG: Stacey Weaver will practice emotion regulation skills, distress tolerance skills, mindfulness skills, and interpersonal effectiveness skills weekly for the next 26 week(s)  STG: Report a decrease in PTSD symptoms as evidenced by a 25% reduction in overall score on a clinician administered PTSD assessment screen/scale  LTG: Increase coping skills to manage depression/anxiety/anger and improve ability to perform daily activities as evidenced by fewer depressive episodes and/or anger outbursts and/or anxious days per self-report and collateral information.  STG: Process life events to the extent needed so that will be able to move forward with various areas of life in a better frame of mind per self-report.  ProgressTowards Goals: Progressing  Interventions: Supportive and Other: ERP   Summary: Stacey Weaver is a 49 y.o. female who presents with prolonged grief  and Bipolar 1 disorder. She presented oriented x5 and stated she was feeling things have  been so busy at work, it's been crazy.  CSW evaluated patient's medication compliance, use of coping tools, and self-care, as applicable.  She provided an update on various aspects of her life that are normally discussed in therapy, including a fullblown anxiety attack that she experienced in the setting of a new restaurant with her friends, adding that she feels she just became over-stimulated.  CSW pointed out that one things she can do to help herself is to remember that she can use the phrase That's just a thought from my brain to minimize the importance of the information that is flooding her at the time of a panic attack.  We talked about how often she is having such events and CSW asked her about whether she wants to learn how to manage this anxiety or how to actually make it smaller.  CSW reminded her about the video about the anxious brain from last session and that it is the amygdala that sends the message that something is wrong, which doesn't mean it is telling the truth all the time.  She has been very busy and did not watch the first of the series of videos online called Noises in your head so with her permission this was shown to her.  She related to it very strongly and said that it described her anxiety exactly.  We then worked on developing a doctor, general practice of anxiety situations, assigning Subjective Units of Distress Scale (1-10), and identifying at least one safety behavior she uses for each.  CSW then sent this to her via email to continue to work on because all her anxieties were in the upper range and it was pointed out to her that we would not start by working on the larger items, but working on lower ones.  Anxiety Situation     SUDS (1-10)   Subjective Units of Distress Scale  Safety Behavior (how do you make the anxiety go away)   Thoughts of losing brother    10  Call him, ask him about symptoms, bug him about appointments   Thinking about childhood trauma   9 Suppress it,  pretend it did not happen, blame and beat self up for sharing it (with mother)   Losing control over mental health/being hospitalized   8 Isolate, dont tell people what is going on, dishonest about feelings   Thoughts of not finding the right partner  7 Isolate   Money issues (fear will not have enough to sustain myself, will be unemployed)  6 Spend less money on shopping problem, only buy stuff that is needed not wanted, ignore finances, put head in sand   Being around big groups of people  5 Leave the situation   [employer] list      Suicidal/Homicidal: No without intent/plan  Therapist Response:  Patient is progressing AEB engaging in scheduled therapy session.  Throughout the session, CSW gave patient the opportunity to explore thoughts and feelings associated with current life situations and past/present stressors.   CSW challenged patient gently and appropriately to consider different ways of looking at reported issues. CSW encouraged patients expression of feelings and validated these using empathy, active listening, open body language, and unconditional positive regard.   Plan/Recommendations:  Return to therapy in 2 weeks to next scheduled appointment on 1/9, reflect on what was discussed in session, engage in self care behaviors as explored in session, do homework  as assigned (watch Noise In Your Head videos, at least second one, finish hierarchy list), and return to next session prepared to talk about experience with new coping methods.   Diagnosis:  No diagnosis found.  Collaboration of Care: Psychiatrist AEB -psychiatrist can read therapy notes; therapist can and does read psychiatric notes prior to sessions   Patient/Guardian was advised Release of Information must be obtained prior to any record release in order to collaborate their care with an outside provider. Patient/Guardian was advised if they have not already done so to contact the registration department to sign all  necessary forms in order for us  to release information regarding their care.   Consent: Patient/Guardian gives verbal consent for treatment and assignment of benefits for services provided during this visit. Patient/Guardian expressed understanding and agreed to proceed.   Stacey JINNY Crest, LCSW 04/06/2024

## 2024-04-09 ENCOUNTER — Encounter (HOSPITAL_COMMUNITY): Payer: Self-pay | Admitting: Clinical

## 2024-05-01 ENCOUNTER — Other Ambulatory Visit (HOSPITAL_COMMUNITY): Payer: Self-pay | Admitting: Student in an Organized Health Care Education/Training Program

## 2024-05-01 DIAGNOSIS — F4381 Prolonged grief disorder: Secondary | ICD-10-CM

## 2024-05-01 DIAGNOSIS — F411 Generalized anxiety disorder: Secondary | ICD-10-CM

## 2024-05-02 ENCOUNTER — Telehealth (HOSPITAL_COMMUNITY): Payer: PRIVATE HEALTH INSURANCE | Admitting: Student in an Organized Health Care Education/Training Program

## 2024-05-02 DIAGNOSIS — G4709 Other insomnia: Secondary | ICD-10-CM | POA: Diagnosis not present

## 2024-05-02 DIAGNOSIS — F411 Generalized anxiety disorder: Secondary | ICD-10-CM

## 2024-05-02 DIAGNOSIS — F4381 Prolonged grief disorder: Secondary | ICD-10-CM | POA: Diagnosis not present

## 2024-05-02 DIAGNOSIS — F3130 Bipolar disorder, current episode depressed, mild or moderate severity, unspecified: Secondary | ICD-10-CM

## 2024-05-02 DIAGNOSIS — R4589 Other symptoms and signs involving emotional state: Secondary | ICD-10-CM

## 2024-05-02 MED ORDER — CLONAZEPAM 0.5 MG PO TABS: 0.5000 mg | ORAL_TABLET | Freq: Every day | ORAL | 0 refills | Status: AC | PRN

## 2024-05-02 MED ORDER — TRAZODONE HCL 300 MG PO TABS: 300.0000 mg | ORAL_TABLET | Freq: Every evening | ORAL | 0 refills | Status: AC | PRN

## 2024-05-02 MED ORDER — CARBAMAZEPINE ER 400 MG PO TB12: 400.0000 mg | ORAL_TABLET | Freq: Two times a day (BID) | ORAL | 0 refills | Status: AC

## 2024-05-02 MED ORDER — ARIPIPRAZOLE 5 MG PO TABS: 5.0000 mg | ORAL_TABLET | Freq: Every day | ORAL | 0 refills | Status: AC

## 2024-05-02 MED ORDER — PRAZOSIN HCL 2 MG PO CAPS: 4.0000 mg | ORAL_CAPSULE | Freq: Every day | ORAL | 2 refills | Status: AC
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
## 2024-05-04 ENCOUNTER — Ambulatory Visit (HOSPITAL_COMMUNITY): Payer: PRIVATE HEALTH INSURANCE | Admitting: Clinical

## 2024-05-04 ENCOUNTER — Encounter (HOSPITAL_COMMUNITY): Payer: Self-pay | Admitting: Clinical

## 2024-05-04 DIAGNOSIS — F3130 Bipolar disorder, current episode depressed, mild or moderate severity, unspecified: Secondary | ICD-10-CM

## 2024-05-04 DIAGNOSIS — F4381 Prolonged grief disorder: Secondary | ICD-10-CM

## 2024-05-04 DIAGNOSIS — F411 Generalized anxiety disorder: Secondary | ICD-10-CM

## 2024-05-04 NOTE — Progress Notes (Signed)
 THERAPIST PROGRESS NOTE  Session Time: 11:00am-11:54am  Session #15  Virtual Visit via Video Note  I connected with Stacey Weaver on 05/04/24 at 11:00 AM EST by a video enabled telemedicine application and verified that I am speaking with the correct person using two identifiers.  Location: Patient: home Provider: home office   I discussed the limitations of evaluation and management by telemedicine and the availability of in person appointments. The patient expressed understanding and agreed to proceed.   I discussed the assessment and treatment plan with the patient. The patient was provided an opportunity to ask questions and all were answered. The patient agreed with the plan and demonstrated an understanding of the instructions.   The patient was advised to call back or seek an in-person evaluation if the symptoms worsen or if the condition fails to improve as anticipated.  I provided 54 minutes of non-face-to-face time during this encounter.  Stacey JINNY Crest, LCSW   Participation Level: Active  Behavioral Response: Casual Alert Depressed, Hopeless, and Worthless   Type of Therapy: Individual Therapy  Treatment Goals addressed:  STG: Identify and decrease cognitive distortions contributing negatively to mood and behavior by identifying 5-7 cognitive distortions patient has and learning how to come up with replacement thoughts that are more balanced, realistic, and helpful.  LTG: Work to arts development officer from models like CBT, Stages of Change, DBT, shame resilience theory, ACT, SFBT, MI, trauma-informed therapy and others to be able to manage mental health symptoms, AEB practicing out of session and reporting back.   STG: Patient will work on forgiveness, shame, sleep, relationship to food, or other issues as appropriate and as such issues present during sessions    LTG: Learn and practice communication techniques such as I statements, open-ended questions,  reflective listening, assertiveness, fair fighting rules, initiating conversations, and more as necessary and taught in session   LTG: Score less than 5 on the GAD-7 as evidenced by intermittent administration of the questionnaire to determine progress in management of anxiety.   STG: Stacey Weaver will practice problem solving skills 3 times per week for the next 4 weeks.  LTG: Learn about the feeling of anxiety and its many variations, the cycle of anxiety and how to interrupt that cycle.    STG: Learn breathing techniques and grounding techniques at an age-appropriate level and demonstrate mastery in session then report independent use of these skills out of session.   STG: Learn about boundary types, how to implement them, and how to enforce them so that patient feels more empowered and content with being able to maintain more helpful, appropriate boundaries in the future for a more balanced result.   LTG: Recall traumatic events without becoming overwhelmed with negative emotions STG: Stacey Weaver will practice emotion regulation skills, distress tolerance skills, mindfulness skills, and interpersonal effectiveness skills weekly for the next 26 week(s)  STG: Report a decrease in PTSD symptoms as evidenced by a 25% reduction in overall score on a clinician administered PTSD assessment screen/scale  LTG: Increase coping skills to manage depression/anxiety/anger and improve ability to perform daily activities as evidenced by fewer depressive episodes and/or anger outbursts and/or anxious days per self-report and collateral information.  STG: Process life events to the extent needed so that will be able to move forward with various areas of life in a better frame of mind per self-report.  ProgressTowards Goals: Progressing  Interventions: Supportive and Other: EFT, sleep work   Summary: Stacey Weaver is a 49 y.o. female  who presents with prolonged grief and Bipolar 1 disorder.  Patient presented to session  visibly upset and fatigued. She reported feeling that life has no meaning and that she has no purpose, stating these feelings predate her mothers death and have intensified over the past 1 weeks due to recurrent nightmares. She described being afraid to sleep and feeling chronically exhausted. Nightmares reportedly replay distressing images that she feels she cant unsee, contributing to irritability and impaired functioning at work. She shared she has been just going off at people, noting decreased patience and increased reactivity.  Session included exploration of values. Patient identified core desires of being loved, wanted, and valued, but also acknowledged a pattern of quickly cutting others off if these needs are not reciprocated, stating she will write them off in a heartbeat. CSW gently highlighted the self-defeating nature of withholding from others what she most desires to receive, and patient demonstrated insight into this pattern. CSW provided empathy and normalization, including appropriate self-disclosure to reinforce hope and the importance of clarifying values and goals.  Patient reported she recently met with her psychiatrist and that her prazosin  dosage was increased to address nightmares. She receives medications via mail order and expects delivery today. CSW provided reassurance that the current nightmare cycle is likely time-limited, as prior episodes have resolved.  CSW provided psychoeducation regarding trauma-informed treatment options, including EMDR as an evidence-based approach for processing distressing imagery, and discussed Emotional Freedom Tapping as a self-soothing strategy (not evidence-based). Patient expressed interest in trying tapping for sleep support. CSW introduced Imagery Rehearsal Therapy techniques; patient described her recurring nightmares and collaboratively rescripted the ending to create a safer, more empowered outcome. She was able to complete  the exercise successfully and appeared noticeably calmer by the end of session.  Suicidal/Homicidal: No without intent/plan  Therapist Response:  Patient experiencing acute distress related to sleep disruption and recurrent nightmares, contributing to fatigue, irritability, and hopeless cognitions. Statements regarding life lacking meaning appear linked to exhaustion and emotional overwhelm rather than active suicidal intent. Insight and engagement in treatment remain intact. Receptive to skills and interventions; demonstrated immediate benefit from imagery rescripting. Medication adjustment underway. Current risk assessed as low to moderate given hopelessness language but mitigated by treatment engagement and help-seeking behavior.  Plan/Recommendations:  Return to therapy at next scheduled appointment on 2/24, reflect on what was discussed in session, engage in self care behaviors as explored in session.  Continue monitoring mood, sleep, and safety. Follow up on medication effects once prazosin  arrives. Encourage daily imagery rehearsal practice and trial of tapping for nighttime regulation. Provide referral information for EMDR-trained therapist if patient wishes to pursue specialized trauma processing. Continue CBT- and values-based work to address relational patterns and meaning-making. Maintain regular sessions and reassess symptom severity next visit.  Diagnosis:  Bipolar I disorder, most recent episode depressed (HCC)  Generalized anxiety disorder  Prolonged grief disorder  Collaboration of Care: Psychiatrist AEB -psychiatrist can read therapy notes; therapist can and does read psychiatric notes prior to sessions   Patient/Guardian was advised Release of Information must be obtained prior to any record release in order to collaborate their care with an outside provider. Patient/Guardian was advised if they have not already done so to contact the registration department to sign all necessary  forms in order for us  to release information regarding their care.   Consent: Patient/Guardian gives verbal consent for treatment and assignment of benefits for services provided during this visit. Patient/Guardian expressed understanding and agreed to proceed.  Stacey JINNY Crest, LCSW 05/04/2024

## 2024-05-22 ENCOUNTER — Ambulatory Visit (HOSPITAL_COMMUNITY): Payer: PRIVATE HEALTH INSURANCE | Admitting: Clinical

## 2024-06-08 ENCOUNTER — Ambulatory Visit (HOSPITAL_COMMUNITY): Admitting: Clinical

## 2024-06-22 ENCOUNTER — Ambulatory Visit (HOSPITAL_COMMUNITY): Admitting: Clinical

## 2024-06-25 ENCOUNTER — Telehealth (HOSPITAL_COMMUNITY): Admitting: Student in an Organized Health Care Education/Training Program
